# Patient Record
Sex: Female | Born: 1945
Health system: Southern US, Community
[De-identification: ages and names within clinical notes are randomized; demographics above are authoritative.]

## PROBLEM LIST (undated history)

## (undated) DIAGNOSIS — L732 Hidradenitis suppurativa: Secondary | ICD-10-CM

## (undated) DIAGNOSIS — K648 Other hemorrhoids: Secondary | ICD-10-CM

## (undated) DIAGNOSIS — J329 Chronic sinusitis, unspecified: Secondary | ICD-10-CM

## (undated) DIAGNOSIS — R9431 Abnormal electrocardiogram [ECG] [EKG]: Secondary | ICD-10-CM

## (undated) DIAGNOSIS — I10 Essential (primary) hypertension: Secondary | ICD-10-CM

## (undated) DIAGNOSIS — T7840XA Allergy, unspecified, initial encounter: Secondary | ICD-10-CM

## (undated) DIAGNOSIS — H269 Unspecified cataract: Secondary | ICD-10-CM

## (undated) DIAGNOSIS — D126 Benign neoplasm of colon, unspecified: Secondary | ICD-10-CM

## (undated) DIAGNOSIS — K760 Fatty (change of) liver, not elsewhere classified: Secondary | ICD-10-CM

## (undated) DIAGNOSIS — M1990 Unspecified osteoarthritis, unspecified site: Secondary | ICD-10-CM

## (undated) DIAGNOSIS — J45909 Unspecified asthma, uncomplicated: Secondary | ICD-10-CM

## (undated) DIAGNOSIS — Z5189 Encounter for other specified aftercare: Secondary | ICD-10-CM

## (undated) DIAGNOSIS — K219 Gastro-esophageal reflux disease without esophagitis: Secondary | ICD-10-CM

## (undated) DIAGNOSIS — E785 Hyperlipidemia, unspecified: Secondary | ICD-10-CM

## (undated) DIAGNOSIS — K635 Polyp of colon: Secondary | ICD-10-CM

## (undated) HISTORY — DX: Encounter for other specified aftercare: Z51.89

## (undated) HISTORY — DX: Essential (primary) hypertension: I10

## (undated) HISTORY — DX: Gastro-esophageal reflux disease without esophagitis: K21.9

## (undated) HISTORY — DX: Allergy, unspecified, initial encounter: T78.40XA

## (undated) HISTORY — DX: Fatty (change of) liver, not elsewhere classified: K76.0

## (undated) HISTORY — PX: COLONOSCOPY: SHX174

## (undated) HISTORY — DX: Benign neoplasm of colon, unspecified: D12.6

## (undated) HISTORY — DX: Hidradenitis suppurativa: L73.2

## (undated) HISTORY — PX: POLYPECTOMY: SHX149

## (undated) HISTORY — PX: TUBAL LIGATION: SHX77

## (undated) HISTORY — DX: Unspecified osteoarthritis, unspecified site: M19.90

## (undated) HISTORY — DX: Unspecified asthma, uncomplicated: J45.909

## (undated) HISTORY — DX: Unspecified cataract: H26.9

## (undated) HISTORY — PX: CARPAL TUNNEL RELEASE: SHX101

## (undated) HISTORY — DX: Other hemorrhoids: K64.8

## (undated) HISTORY — DX: Polyp of colon: K63.5

## (undated) HISTORY — DX: Hyperlipidemia, unspecified: E78.5

---

## 1998-06-10 ENCOUNTER — Ambulatory Visit: Admission: RE | Admit: 1998-06-10 | Discharge: 1998-06-10 | Payer: Self-pay | Admitting: Internal Medicine

## 1998-06-10 ENCOUNTER — Encounter: Payer: Self-pay | Admitting: Internal Medicine

## 1999-07-23 ENCOUNTER — Encounter: Payer: Self-pay | Admitting: Internal Medicine

## 1999-07-23 ENCOUNTER — Encounter: Admission: RE | Admit: 1999-07-23 | Discharge: 1999-07-23 | Payer: Self-pay | Admitting: Internal Medicine

## 1999-08-24 ENCOUNTER — Other Ambulatory Visit: Admission: RE | Admit: 1999-08-24 | Discharge: 1999-08-24 | Payer: Self-pay | Admitting: Internal Medicine

## 2000-07-27 ENCOUNTER — Encounter: Admission: RE | Admit: 2000-07-27 | Discharge: 2000-07-27 | Payer: Self-pay | Admitting: Internal Medicine

## 2000-07-27 ENCOUNTER — Encounter: Payer: Self-pay | Admitting: Internal Medicine

## 2001-08-02 ENCOUNTER — Encounter: Payer: Self-pay | Admitting: Internal Medicine

## 2001-08-02 ENCOUNTER — Encounter: Admission: RE | Admit: 2001-08-02 | Discharge: 2001-08-02 | Payer: Self-pay | Admitting: Internal Medicine

## 2001-09-20 ENCOUNTER — Other Ambulatory Visit: Admission: RE | Admit: 2001-09-20 | Discharge: 2001-09-20 | Payer: Self-pay | Admitting: Internal Medicine

## 2001-10-30 ENCOUNTER — Encounter: Admission: RE | Admit: 2001-10-30 | Discharge: 2001-10-30 | Payer: Self-pay | Admitting: Gynecology

## 2001-10-30 ENCOUNTER — Encounter: Payer: Self-pay | Admitting: Gynecology

## 2002-08-07 ENCOUNTER — Encounter: Payer: Self-pay | Admitting: Internal Medicine

## 2002-08-07 ENCOUNTER — Encounter: Admission: RE | Admit: 2002-08-07 | Discharge: 2002-08-07 | Payer: Self-pay | Admitting: Internal Medicine

## 2003-07-30 ENCOUNTER — Other Ambulatory Visit: Admission: RE | Admit: 2003-07-30 | Discharge: 2003-07-30 | Payer: Self-pay | Admitting: General Practice

## 2003-08-15 ENCOUNTER — Encounter: Admission: RE | Admit: 2003-08-15 | Discharge: 2003-08-15 | Payer: Self-pay | Admitting: Internal Medicine

## 2004-07-27 ENCOUNTER — Ambulatory Visit: Payer: Self-pay | Admitting: Internal Medicine

## 2004-08-14 ENCOUNTER — Other Ambulatory Visit: Admission: RE | Admit: 2004-08-14 | Discharge: 2004-08-14 | Payer: Self-pay | Admitting: Internal Medicine

## 2004-08-14 ENCOUNTER — Ambulatory Visit: Payer: Self-pay | Admitting: Internal Medicine

## 2004-08-25 ENCOUNTER — Encounter: Admission: RE | Admit: 2004-08-25 | Discharge: 2004-08-25 | Payer: Self-pay | Admitting: Internal Medicine

## 2004-09-10 ENCOUNTER — Ambulatory Visit: Payer: Self-pay | Admitting: Internal Medicine

## 2005-02-19 ENCOUNTER — Ambulatory Visit: Payer: Self-pay | Admitting: Internal Medicine

## 2005-04-08 ENCOUNTER — Ambulatory Visit: Payer: Self-pay | Admitting: Internal Medicine

## 2005-09-14 ENCOUNTER — Encounter: Admission: RE | Admit: 2005-09-14 | Discharge: 2005-09-14 | Payer: Self-pay | Admitting: Internal Medicine

## 2006-04-06 ENCOUNTER — Ambulatory Visit: Payer: Self-pay | Admitting: Internal Medicine

## 2006-04-11 ENCOUNTER — Ambulatory Visit: Payer: Self-pay | Admitting: Internal Medicine

## 2006-04-11 ENCOUNTER — Encounter: Payer: Self-pay | Admitting: Internal Medicine

## 2006-04-11 ENCOUNTER — Other Ambulatory Visit: Admission: RE | Admit: 2006-04-11 | Discharge: 2006-04-11 | Payer: Self-pay | Admitting: Internal Medicine

## 2006-06-20 ENCOUNTER — Ambulatory Visit: Payer: Self-pay | Admitting: Internal Medicine

## 2006-09-15 ENCOUNTER — Encounter: Admission: RE | Admit: 2006-09-15 | Discharge: 2006-09-15 | Payer: Self-pay | Admitting: Internal Medicine

## 2006-10-12 ENCOUNTER — Ambulatory Visit: Payer: Self-pay | Admitting: Internal Medicine

## 2006-12-09 ENCOUNTER — Ambulatory Visit (HOSPITAL_COMMUNITY): Admission: RE | Admit: 2006-12-09 | Discharge: 2006-12-09 | Payer: Self-pay | Admitting: Internal Medicine

## 2006-12-14 ENCOUNTER — Ambulatory Visit: Payer: Self-pay | Admitting: Internal Medicine

## 2007-01-13 ENCOUNTER — Ambulatory Visit: Payer: Self-pay | Admitting: Internal Medicine

## 2007-01-16 ENCOUNTER — Ambulatory Visit: Payer: Self-pay | Admitting: Internal Medicine

## 2007-01-16 LAB — CONVERTED CEMR LAB
AST: 29 units/L (ref 0–37)
Albumin: 3.9 g/dL (ref 3.5–5.2)
Basophils Absolute: 0 10*3/uL (ref 0.0–0.1)
Bilirubin Urine: NEGATIVE
Bilirubin, Direct: 0.1 mg/dL (ref 0.0–0.3)
CO2: 29 meq/L (ref 19–32)
Calcium: 9.3 mg/dL (ref 8.4–10.5)
Chloride: 109 meq/L (ref 96–112)
Creatinine, Ser: 0.8 mg/dL (ref 0.4–1.2)
Crystals: NEGATIVE
Eosinophils Absolute: 0.1 10*3/uL (ref 0.0–0.6)
Glucose, Bld: 100 mg/dL — ABNORMAL HIGH (ref 70–99)
HDL: 37.7 mg/dL — ABNORMAL LOW (ref >39.0)
Hemoglobin: 12.6 g/dL (ref 12.0–15.0)
Hgb A1c MFr Bld: 5.8 % (ref 4.6–6.0)
Ketones, ur: NEGATIVE mg/dL
Lymphocytes Relative: 35.1 % (ref 12.0–46.0)
MCV: 74 fL — ABNORMAL LOW (ref 78.0–100.0)
Monocytes Relative: 10.2 % (ref 3.0–11.0)
Mucus, UA: NEGATIVE
Neutro Abs: 2.4 10*3/uL (ref 1.4–7.7)
Neutrophils Relative %: 50.3 % (ref 43.0–77.0)
Potassium: 3.9 meq/L (ref 3.5–5.1)
RBC: 5.27 M/uL — ABNORMAL HIGH (ref 3.87–5.11)
Sodium: 143 meq/L (ref 135–145)
Total Bilirubin: 0.8 mg/dL (ref 0.3–1.2)
Total Protein: 7.1 g/dL (ref 6.0–8.3)
Triglycerides: 104 mg/dL (ref 0–149)
Urine Glucose: NEGATIVE mg/dL
Urobilinogen, UA: 0.2 (ref 0.0–1.0)
Vit D, 1,25-Dihydroxy: 23 (ref 20–57)
WBC: 4.4 10*3/uL — ABNORMAL LOW (ref 4.5–10.5)

## 2007-03-17 DIAGNOSIS — D126 Benign neoplasm of colon, unspecified: Secondary | ICD-10-CM

## 2007-03-17 HISTORY — DX: Benign neoplasm of colon, unspecified: D12.6

## 2007-03-21 ENCOUNTER — Ambulatory Visit: Payer: Self-pay | Admitting: Gastroenterology

## 2007-04-13 ENCOUNTER — Ambulatory Visit: Payer: Self-pay | Admitting: Gastroenterology

## 2007-04-13 ENCOUNTER — Encounter: Payer: Self-pay | Admitting: Gastroenterology

## 2007-04-19 ENCOUNTER — Inpatient Hospital Stay (HOSPITAL_COMMUNITY): Admission: EM | Admit: 2007-04-19 | Discharge: 2007-04-21 | Payer: Self-pay | Admitting: Emergency Medicine

## 2007-04-28 ENCOUNTER — Ambulatory Visit: Payer: Self-pay | Admitting: Gastroenterology

## 2007-05-11 ENCOUNTER — Ambulatory Visit: Payer: Self-pay | Admitting: Gastroenterology

## 2007-05-11 LAB — CONVERTED CEMR LAB
Basophils Absolute: 0.1 10*3/uL (ref 0.0–0.1)
Basophils Relative: 0.9 % (ref 0.0–1.0)
Eosinophils Relative: 3.4 % (ref 0.0–5.0)
Hemoglobin: 11.9 g/dL — ABNORMAL LOW (ref 12.0–15.0)
MCHC: 32.4 g/dL (ref 30.0–36.0)
Monocytes Absolute: 0.8 10*3/uL — ABNORMAL HIGH (ref 0.2–0.7)
Neutrophils Relative %: 55.1 % (ref 43.0–77.0)
RBC: 4.59 M/uL (ref 3.87–5.11)
RDW: 14.3 % (ref 11.5–14.6)

## 2007-06-12 DIAGNOSIS — E119 Type 2 diabetes mellitus without complications: Secondary | ICD-10-CM

## 2007-06-12 HISTORY — DX: Type 2 diabetes mellitus without complications: E11.9

## 2007-06-20 ENCOUNTER — Ambulatory Visit: Payer: Self-pay | Admitting: Internal Medicine

## 2007-06-20 DIAGNOSIS — E785 Hyperlipidemia, unspecified: Secondary | ICD-10-CM

## 2007-06-20 HISTORY — DX: Hyperlipidemia, unspecified: E78.5

## 2007-06-20 LAB — CONVERTED CEMR LAB
ALT: 42 units/L — ABNORMAL HIGH (ref 0–35)
AST: 31 units/L (ref 0–37)
LDL Cholesterol: 126 mg/dL — ABNORMAL HIGH (ref 0–99)
VLDL: 16 mg/dL (ref 0–40)

## 2007-07-11 ENCOUNTER — Ambulatory Visit: Payer: Self-pay | Admitting: Internal Medicine

## 2007-07-11 ENCOUNTER — Ambulatory Visit: Payer: Self-pay | Admitting: Gastroenterology

## 2007-07-11 DIAGNOSIS — I10 Essential (primary) hypertension: Secondary | ICD-10-CM

## 2007-07-11 DIAGNOSIS — D500 Iron deficiency anemia secondary to blood loss (chronic): Secondary | ICD-10-CM | POA: Insufficient documentation

## 2007-07-11 DIAGNOSIS — Z86010 Personal history of colonic polyps: Secondary | ICD-10-CM

## 2007-07-11 HISTORY — DX: Essential (primary) hypertension: I10

## 2007-07-11 LAB — CONVERTED CEMR LAB
Eosinophils Absolute: 0.2 10*3/uL (ref 0.0–0.6)
HCT: 39.9 % (ref 36.0–46.0)
Hemoglobin: 12.9 g/dL (ref 12.0–15.0)
MCHC: 32.2 g/dL (ref 30.0–36.0)
Monocytes Absolute: 0.6 10*3/uL (ref 0.2–0.7)
Monocytes Relative: 9.9 % (ref 3.0–11.0)
Neutro Abs: 3.6 10*3/uL (ref 1.4–7.7)
Neutrophils Relative %: 54.2 % (ref 43.0–77.0)
RBC: 5.2 M/uL — ABNORMAL HIGH (ref 3.87–5.11)
RDW: 13.2 % (ref 11.5–14.6)

## 2007-08-29 ENCOUNTER — Telehealth: Payer: Self-pay | Admitting: Internal Medicine

## 2007-09-25 ENCOUNTER — Telehealth: Payer: Self-pay | Admitting: Internal Medicine

## 2007-10-09 ENCOUNTER — Ambulatory Visit (HOSPITAL_COMMUNITY): Admission: RE | Admit: 2007-10-09 | Discharge: 2007-10-09 | Payer: Self-pay | Admitting: Internal Medicine

## 2007-10-24 ENCOUNTER — Ambulatory Visit: Payer: Self-pay | Admitting: Internal Medicine

## 2007-10-24 DIAGNOSIS — R7309 Other abnormal glucose: Secondary | ICD-10-CM | POA: Insufficient documentation

## 2007-10-24 LAB — CONVERTED CEMR LAB
Basophils Relative: 0.9 % (ref 0.0–1.0)
CO2: 28 meq/L (ref 19–32)
Creatinine, Ser: 0.9 mg/dL (ref 0.4–1.2)
Eosinophils Absolute: 0.2 10*3/uL (ref 0.0–0.6)
Eosinophils Relative: 3.6 % (ref 0.0–5.0)
GFR calc Af Amer: 82 mL/min
GFR calc non Af Amer: 68 mL/min
MCHC: 30.9 g/dL (ref 30.0–36.0)
Monocytes Absolute: 0.7 10*3/uL (ref 0.2–0.7)
Monocytes Relative: 12.5 % — ABNORMAL HIGH (ref 3.0–11.0)
Neutro Abs: 2.6 10*3/uL (ref 1.4–7.7)
Potassium: 4.2 meq/L (ref 3.5–5.1)
RDW: 12.7 % (ref 11.5–14.6)
Sodium: 141 meq/L (ref 135–145)
WBC: 5.3 10*3/uL (ref 4.5–10.5)

## 2007-10-27 ENCOUNTER — Ambulatory Visit: Payer: Self-pay | Admitting: Internal Medicine

## 2007-10-27 DIAGNOSIS — D649 Anemia, unspecified: Secondary | ICD-10-CM

## 2007-10-27 DIAGNOSIS — K922 Gastrointestinal hemorrhage, unspecified: Secondary | ICD-10-CM | POA: Insufficient documentation

## 2007-10-27 DIAGNOSIS — K921 Melena: Secondary | ICD-10-CM

## 2007-10-27 DIAGNOSIS — G5600 Carpal tunnel syndrome, unspecified upper limb: Secondary | ICD-10-CM | POA: Insufficient documentation

## 2007-10-27 DIAGNOSIS — M1990 Unspecified osteoarthritis, unspecified site: Secondary | ICD-10-CM

## 2007-10-27 DIAGNOSIS — K649 Unspecified hemorrhoids: Secondary | ICD-10-CM | POA: Insufficient documentation

## 2007-10-27 HISTORY — DX: Anemia, unspecified: D64.9

## 2007-10-27 HISTORY — DX: Unspecified hemorrhoids: K64.9

## 2007-10-27 HISTORY — DX: Melena: K92.1

## 2007-10-27 HISTORY — DX: Carpal tunnel syndrome, unspecified upper limb: G56.00

## 2007-10-27 HISTORY — DX: Gastrointestinal hemorrhage, unspecified: K92.2

## 2008-03-05 ENCOUNTER — Ambulatory Visit: Payer: Self-pay | Admitting: Gastroenterology

## 2008-03-07 ENCOUNTER — Telehealth: Payer: Self-pay | Admitting: Gastroenterology

## 2008-03-08 ENCOUNTER — Telehealth (INDEPENDENT_AMBULATORY_CARE_PROVIDER_SITE_OTHER): Payer: Self-pay | Admitting: General Practice

## 2008-03-11 ENCOUNTER — Telehealth: Payer: Self-pay | Admitting: Gastroenterology

## 2008-03-22 ENCOUNTER — Ambulatory Visit: Payer: Self-pay | Admitting: Internal Medicine

## 2008-03-22 DIAGNOSIS — R609 Edema, unspecified: Secondary | ICD-10-CM | POA: Insufficient documentation

## 2008-04-02 ENCOUNTER — Ambulatory Visit: Payer: Self-pay | Admitting: Gastroenterology

## 2008-04-02 ENCOUNTER — Encounter: Payer: Self-pay | Admitting: Gastroenterology

## 2008-04-02 LAB — HM COLONOSCOPY

## 2008-04-04 ENCOUNTER — Encounter: Payer: Self-pay | Admitting: Gastroenterology

## 2008-05-16 ENCOUNTER — Ambulatory Visit: Payer: Self-pay | Admitting: Internal Medicine

## 2008-05-16 LAB — CONVERTED CEMR LAB
BUN: 15 mg/dL (ref 6–23)
CO2: 30 meq/L (ref 19–32)
Chloride: 105 meq/L (ref 96–112)
Creatinine, Ser: 1 mg/dL (ref 0.4–1.2)
Eosinophils Relative: 2.3 % (ref 0.0–5.0)
Glucose, Bld: 97 mg/dL (ref 70–99)
Hgb A1c MFr Bld: 6 % (ref 4.6–6.0)
Lymphocytes Relative: 35.3 % (ref 12.0–46.0)
Monocytes Relative: 12.9 % — ABNORMAL HIGH (ref 3.0–12.0)
Neutrophils Relative %: 48.7 % (ref 43.0–77.0)
Platelets: 318 10*3/uL (ref 150–400)
Potassium: 4.3 meq/L (ref 3.5–5.1)
RBC: 5.11 M/uL (ref 3.87–5.11)
WBC: 5.8 10*3/uL (ref 4.5–10.5)

## 2008-05-22 ENCOUNTER — Ambulatory Visit: Payer: Self-pay | Admitting: Internal Medicine

## 2008-08-05 ENCOUNTER — Ambulatory Visit: Payer: Self-pay | Admitting: Internal Medicine

## 2008-08-05 DIAGNOSIS — J039 Acute tonsillitis, unspecified: Secondary | ICD-10-CM | POA: Insufficient documentation

## 2008-08-05 HISTORY — DX: Acute tonsillitis, unspecified: J03.90

## 2008-08-06 ENCOUNTER — Telehealth (INDEPENDENT_AMBULATORY_CARE_PROVIDER_SITE_OTHER): Payer: Self-pay | Admitting: *Deleted

## 2008-08-07 ENCOUNTER — Emergency Department (HOSPITAL_COMMUNITY): Admission: EM | Admit: 2008-08-07 | Discharge: 2008-08-07 | Payer: Self-pay | Admitting: Emergency Medicine

## 2008-08-19 ENCOUNTER — Ambulatory Visit: Payer: Self-pay | Admitting: Internal Medicine

## 2008-09-23 ENCOUNTER — Telehealth: Payer: Self-pay | Admitting: Internal Medicine

## 2008-11-07 ENCOUNTER — Telehealth: Payer: Self-pay | Admitting: Internal Medicine

## 2008-11-25 ENCOUNTER — Ambulatory Visit (HOSPITAL_COMMUNITY): Admission: RE | Admit: 2008-11-25 | Discharge: 2008-11-25 | Payer: Self-pay | Admitting: Internal Medicine

## 2008-12-03 ENCOUNTER — Ambulatory Visit: Payer: Self-pay | Admitting: Internal Medicine

## 2008-12-11 ENCOUNTER — Telehealth: Payer: Self-pay | Admitting: Internal Medicine

## 2009-01-14 DIAGNOSIS — L732 Hidradenitis suppurativa: Secondary | ICD-10-CM

## 2009-01-14 HISTORY — DX: Hidradenitis suppurativa: L73.2

## 2009-01-31 ENCOUNTER — Ambulatory Visit: Payer: Self-pay | Admitting: Internal Medicine

## 2009-01-31 DIAGNOSIS — L03818 Cellulitis of other sites: Secondary | ICD-10-CM

## 2009-01-31 DIAGNOSIS — L0390 Cellulitis, unspecified: Secondary | ICD-10-CM | POA: Insufficient documentation

## 2009-01-31 DIAGNOSIS — L02818 Cutaneous abscess of other sites: Secondary | ICD-10-CM

## 2009-02-01 ENCOUNTER — Ambulatory Visit: Payer: Self-pay | Admitting: Family Medicine

## 2009-02-01 ENCOUNTER — Encounter: Payer: Self-pay | Admitting: Internal Medicine

## 2009-02-03 ENCOUNTER — Ambulatory Visit: Payer: Self-pay | Admitting: Internal Medicine

## 2009-02-06 ENCOUNTER — Telehealth: Payer: Self-pay | Admitting: Internal Medicine

## 2009-04-04 ENCOUNTER — Ambulatory Visit: Payer: Self-pay | Admitting: Internal Medicine

## 2009-04-04 DIAGNOSIS — R42 Dizziness and giddiness: Secondary | ICD-10-CM

## 2009-04-04 HISTORY — DX: Dizziness and giddiness: R42

## 2009-06-06 ENCOUNTER — Ambulatory Visit: Payer: Self-pay | Admitting: Internal Medicine

## 2009-09-08 ENCOUNTER — Ambulatory Visit: Payer: Self-pay | Admitting: Internal Medicine

## 2009-09-08 DIAGNOSIS — N3090 Cystitis, unspecified without hematuria: Secondary | ICD-10-CM

## 2009-09-08 HISTORY — DX: Cystitis, unspecified without hematuria: N30.90

## 2009-09-10 LAB — CONVERTED CEMR LAB
Bilirubin Urine: NEGATIVE
Hemoglobin, Urine: NEGATIVE
Ketones, ur: NEGATIVE mg/dL
Urine Glucose: NEGATIVE mg/dL
pH: 5 (ref 5.0–8.0)

## 2009-09-26 ENCOUNTER — Telehealth: Payer: Self-pay | Admitting: Internal Medicine

## 2009-12-01 ENCOUNTER — Ambulatory Visit: Payer: Self-pay | Admitting: Internal Medicine

## 2009-12-01 DIAGNOSIS — H6120 Impacted cerumen, unspecified ear: Secondary | ICD-10-CM

## 2009-12-01 HISTORY — DX: Impacted cerumen, unspecified ear: H61.20

## 2009-12-22 ENCOUNTER — Ambulatory Visit (HOSPITAL_COMMUNITY): Admission: RE | Admit: 2009-12-22 | Discharge: 2009-12-22 | Payer: Self-pay | Admitting: Internal Medicine

## 2010-02-06 ENCOUNTER — Encounter (INDEPENDENT_AMBULATORY_CARE_PROVIDER_SITE_OTHER): Payer: Self-pay | Admitting: *Deleted

## 2010-03-12 ENCOUNTER — Telehealth: Payer: Self-pay | Admitting: Gastroenterology

## 2010-03-13 ENCOUNTER — Ambulatory Visit: Payer: Self-pay | Admitting: Internal Medicine

## 2010-03-13 LAB — CONVERTED CEMR LAB
CO2: 26 meq/L (ref 19–32)
Calcium: 9.1 mg/dL (ref 8.4–10.5)
Potassium: 4.1 meq/L (ref 3.5–5.1)
Sodium: 136 meq/L (ref 135–145)

## 2010-03-30 ENCOUNTER — Ambulatory Visit: Payer: Self-pay | Admitting: Internal Medicine

## 2010-06-23 ENCOUNTER — Telehealth: Payer: Self-pay | Admitting: Internal Medicine

## 2010-07-27 ENCOUNTER — Ambulatory Visit: Payer: Self-pay | Admitting: Internal Medicine

## 2010-07-27 DIAGNOSIS — H60399 Other infective otitis externa, unspecified ear: Secondary | ICD-10-CM

## 2010-07-27 DIAGNOSIS — H6090 Unspecified otitis externa, unspecified ear: Secondary | ICD-10-CM

## 2010-07-27 HISTORY — DX: Unspecified otitis externa, unspecified ear: H60.90

## 2010-07-30 LAB — CONVERTED CEMR LAB
CO2: 29 meq/L (ref 19–32)
Chloride: 105 meq/L (ref 96–112)
Creatinine, Ser: 0.9 mg/dL (ref 0.4–1.2)
GFR calc non Af Amer: 83.28 mL/min (ref >60.00)
Hgb A1c MFr Bld: 6.1 % (ref 4.6–6.5)
Potassium: 4.5 meq/L (ref 3.5–5.1)
Sodium: 141 meq/L (ref 135–145)

## 2010-09-15 ENCOUNTER — Ambulatory Visit
Admission: RE | Admit: 2010-09-15 | Discharge: 2010-09-15 | Payer: Self-pay | Source: Home / Self Care | Attending: Internal Medicine | Admitting: Internal Medicine

## 2010-09-15 LAB — CONVERTED CEMR LAB
Glucose, Urine, Semiquant: NEGATIVE
Ketones, urine, test strip: NEGATIVE
Specific Gravity, Urine: 1.015
pH: 6

## 2010-09-15 NOTE — Assessment & Plan Note (Signed)
Summary: 3 mos f/u//#/cd   Vital Signs:  Patient profile:   65 year old female Height:      61 inches Weight:      153.75 pounds BMI:     29.16 O2 Sat:      97 % on Room air Temp:     97.4 degrees F oral Pulse rate:   94 / minute BP sitting:   106 / 68  (left arm) Cuff size:   regular  Vitals Entered By: Lucious Groves (December 01, 2009 9:16 AM)  O2 Flow:  Room air CC: F/U--Per pt no symptoms or complaints./kb Is Patient Diabetic? No Pain Assessment Patient in pain? no        Primary Care Provider:  Tresa Garter MD  CC:  F/U--Per pt no symptoms or complaints./kb.  History of Present Illness: The patient presents for a follow up of hypertension, hyperlipidemia   Current Medications (verified): 1)  Zyrtec Allergy 10 Mg  Tabs (Cetirizine Hcl) .... As Needed 2)  Vitamin D3 1000 Unit  Tabs (Cholecalciferol) .Marland Kitchen.. 1 Qd 3)  Enalapril Maleate 10 Mg  Tabs (Enalapril Maleate) .Marland Kitchen.. 1 Qd 4)  Lovastatin 20 Mg Tabs (Lovastatin) .... Take 1 Tab By Mouth Daily  Allergies (verified): No Known Drug Allergies  Past History:  Past Medical History: Last updated: 02/03/2009 Diabetes mellitus, type II GERD fatty liver infiltration elevated liver function test  790.5 chronic T  wave abn ,st  on ekg Colonic polyps, hx of Hyperlipidemia Hypertension R hydradenitis S aureus 01/2009  Social History: Last updated: 12/01/2009 Retired Never Smoked Alcohol use-no Drug use-no Regular exercise-yes [employer]  Social History: Retired Never Smoked Alcohol use-no Drug use-no Regular exercise-yes [employer]  Physical Exam  General:  Well-developed,well-nourished,in no acute distress; alert,appropriate and cooperative throughout examination Ears:  wax B R>L Mouth:  Oral mucosa and oropharynx without lesions or exudates.  Teeth in good repair. Lungs:  Normal respiratory effort, chest expands symmetrically. Lungs are clear to auscultation, no crackles or wheezes. Heart:  Normal rate and  regular rhythm. S1 and S2 normal without gallop, murmur, click, rub or other extra sounds. Abdomen:  Bowel sounds positive,abdomen soft and non-tender without masses, organomegaly or hernias noted. Msk:  No deformity or scoliosis noted of thoracic or lumbar spine.   Neurologic:  No cranial nerve deficits noted. Station and gait are normal. Plantar reflexes are down-going bilaterally. DTRs are symmetrical throughout. Sensory, motor and coordinative functions appear intact. Skin:  Clear Psych:  Oriented X3 and normally interactive.     Impression & Recommendations:  Problem # 1:  HYPERTENSION (ICD-401.9) Assessment Unchanged  The following medications were removed from the medication list:    Furosemide 20 Mg Tabs (Furosemide) .Marland Kitchen... Take 1 tab by mouth every day as needed swelling Her updated medication list for this problem includes:    Enalapril Maleate 10 Mg Tabs (Enalapril maleate) .Marland Kitchen... 1 qd  Problem # 2:  HYPERLIPIDEMIA (ICD-272.4) Assessment: Unchanged  Her updated medication list for this problem includes:    Lovastatin 20 Mg Tabs (Lovastatin) .Marland Kitchen... Take 1 tab by mouth daily  Problem # 3:  DIABETES MELLITUS, TYPE II (ICD-250.00) Assessment: Comment Only  Her updated medication list for this problem includes:    Enalapril Maleate 10 Mg Tabs (Enalapril maleate) .Marland Kitchen... 1 qd  Labs Reviewed: Creat: 1.0 (05/16/2008)    Reviewed HgBA1c results: 6.0 (05/16/2008)  5.9 (10/24/2007)  Problem # 4:  CERUMEN IMPACTION (ICD-380.4) R>>L Assessment: New She will use irrigation at home  Complete Medication List: 1)  Zyrtec Allergy 10 Mg Tabs (Cetirizine hcl) .... As needed 2)  Vitamin D3 1000 Unit Tabs (Cholecalciferol) .Marland Kitchen.. 1 qd 3)  Enalapril Maleate 10 Mg Tabs (Enalapril maleate) .Marland Kitchen.. 1 qd 4)  Lovastatin 20 Mg Tabs (Lovastatin) .... Take 1 tab by mouth daily  Patient Instructions: 1)  Please schedule a follow-up appointment in 4 months. 2)  BMP prior to visit, ICD-9:250.00 3)   HbgA1C prior to visit, ICD-9:

## 2010-09-15 NOTE — Progress Notes (Signed)
Summary: ABX?   Phone Note Call from Patient   Summary of Call: Pt was give rx for antibiotic for uti recently. She continues to c/o pressure and "twinges" everyone once in a while. She thinks she may need another antibiotic.  Initial call taken by: Lamar Sprinkles, CMA,  September 26, 2009 5:07 PM  Follow-up for Phone Call        [facility] Follow-up by: Tresa Garter MD,  September 26, 2009 5:28 PM  Additional Follow-up for Phone Call Additional follow up Details #1::        Left vm for pt to check with her pharmacy.  Additional Follow-up by: Lamar Sprinkles, CMA,  September 26, 2009 6:07 PM    New/Updated Medications: CIPROFLOXACIN HCL 250 MG TABS (CIPROFLOXACIN HCL) 1 by mouth two times a day for cystitis Prescriptions: CIPROFLOXACIN HCL 250 MG TABS (CIPROFLOXACIN HCL) 1 by mouth two times a day for cystitis  #10 x 0   Entered and Authorized by:   Tresa Garter MD   Signed by:   Lamar Sprinkles, CMA on 09/26/2009   Method used:   Electronically to        [facility] [phone]* (retail)       [address]       [place], [place]  [address]       Ph: [phone]       Fax: [phone]   RxID:   [id number]

## 2010-09-15 NOTE — Assessment & Plan Note (Signed)
Summary: 3 mo rov /nws   Vital Signs:  Patient profile:   65 year old female Weight:      156 pounds Temp:     97.9 degrees F oral Pulse rate:   94 / minute BP sitting:   102 / 84  (left arm)  Vitals Entered By: Tora Perches (September 08, 2009 9:21 AM) CC: f/u   Primary Care Provider:  Tresa Garter MD  CC:  f/u.  History of Present Illness: C/o pain under L shoulder blade worse w/ROM C/o UTI The patient presents for a follow up of hypertension, diabetes, hyperlipidemia   Preventive Screening-Counseling & Management  Alcohol-Tobacco     Smoking Status: never  Current Medications (verified): 1)  Zyrtec Allergy 10 Mg  Tabs (Cetirizine Hcl) .... As Needed 2)  Vitamin D3 1000 Unit  Tabs (Cholecalciferol) .Marland Kitchen.. 1 Qd 3)  Enalapril Maleate 10 Mg  Tabs (Enalapril Maleate) .Marland Kitchen.. 1 Qd 4)  Lovastatin 20 Mg Tabs (Lovastatin) .... Take 1 Tab By Mouth Daily 5)  Furosemide 20 Mg Tabs (Furosemide) .... Take 1 Tab By Mouth Every Day As Needed Swelling 6)  Hydrocodone-Acetaminophen 7.5-500 Mg/7ml Soln (Hydrocodone-Acetaminophen) .... 5-10 Ml By Mouth Q 6 Hours As Needed Pain 7)  Triamcinolone Acetonide 0.5 % Crea (Triamcinolone Acetonide) .... Apply Bid To Affected Area 8)  Meclizine Hcl 12.5 Mg Tabs (Meclizine Hcl) .Marland Kitchen.. 1 By Mouth Qid As Needed Dizziness 9)  Transderm-Scop 1.5 Mg Pt72 (Scopolamine Base) .Marland Kitchen.. 1 Q 72 H  Allergies (verified): No Known Drug Allergies  Past History:  Past Medical History: Last updated: 02/03/2009 Diabetes mellitus, type II GERD fatty liver infiltration elevated liver function test  790.5 chronic T  wave abn ,st  on ekg Colonic polyps, hx of Hyperlipidemia Hypertension R hydradenitis S aureus 01/2009  Social History: Last updated: 08/05/2008 Retired Never Smoked Alcohol use-no Drug use-no Regular exercise-no  Family History: Reviewed history from 07/11/2007 and no changes required. Family History Hypertension Family History of Stroke  F 1st degree relative <60  Review of Systems  The patient denies fever, abdominal pain, difficulty walking, depression, and abnormal bleeding.    Physical Exam  General:  Well-developed,well-nourished,in no acute distress; alert,appropriate and cooperative throughout examination Mouth:  Oral mucosa and oropharynx without lesions or exudates.  Teeth in good repair. Chest [place]:  tender muscle under L scapula Lungs:  Normal respiratory effort, chest expands symmetrically. Lungs are clear to auscultation, no crackles or wheezes. Heart:  Normal rate and regular rhythm. S1 and S2 normal without gallop, murmur, click, rub or other extra sounds. Abdomen:  Bowel sounds positive,abdomen soft and non-tender without masses, organomegaly or hernias noted. Msk:  No deformity or scoliosis noted of thoracic or lumbar spine.   Neurologic:  No cranial nerve deficits noted. Station and gait are normal. Plantar reflexes are down-going bilaterally. DTRs are symmetrical throughout. Sensory, motor and coordinative functions appear intact. Skin:  Clear Psych:  Oriented X3 and normally interactive.     Impression & Recommendations:  Problem # 1:  HYPERTENSION (ICD-401.9) Assessment Comment Only  Her updated medication list for this problem includes:    Enalapril Maleate 10 Mg Tabs (Enalapril maleate) .Marland Kitchen... 1 qd    Furosemide 20 Mg Tabs (Furosemide) .Marland Kitchen... Take 1 tab by mouth every day as needed swelling  Problem # 2:  DIABETES MELLITUS, TYPE II (ICD-250.00) Assessment: Comment Only  Her updated medication list for this problem includes:    Enalapril Maleate 10 Mg Tabs (Enalapril maleate) .Marland Kitchen... 1 qd  Problem # 3:  GERD (ICD-530.81) Assessment: Comment Only  Problem # 4:  CYSTITIS (ICD-595.9)  Her updated medication list for this problem includes:    Ciprofloxacin Hcl 250 Mg Tabs (Ciprofloxacin hcl) .Marland Kitchen... 1 by mouth two times a day for cystitis  Orders: TLB-Udip ONLY (81003-UDIP)  Problem # 5:   HYPERLIPIDEMIA (ICD-272.4) Assessment: Comment Only  Her updated medication list for this problem includes:    Lovastatin 20 Mg Tabs (Lovastatin) .Marland Kitchen... Take 1 tab by mouth daily  Problem # 6:  Chest Marschner pain   MSK Assessment: Improved Heat  Complete Medication List: 1)  Zyrtec Allergy 10 Mg Tabs (Cetirizine hcl) .... As needed 2)  Vitamin D3 1000 Unit Tabs (Cholecalciferol) .Marland Kitchen.. 1 qd 3)  Enalapril Maleate 10 Mg Tabs (Enalapril maleate) .Marland Kitchen.. 1 qd 4)  Lovastatin 20 Mg Tabs (Lovastatin) .... Take 1 tab by mouth daily 5)  Furosemide 20 Mg Tabs (Furosemide) .... Take 1 tab by mouth every day as needed swelling 6)  Hydrocodone-acetaminophen 7.5-500 Mg/23ml Soln (Hydrocodone-acetaminophen) .... 5-10 ml by mouth q 6 hours as needed pain 7)  Triamcinolone Acetonide 0.5 % Crea (Triamcinolone acetonide) .... Apply bid to affected area 8)  Meclizine Hcl 12.5 Mg Tabs (Meclizine hcl) .Marland Kitchen.. 1 by mouth qid as needed dizziness 9)  Transderm-scop 1.5 Mg Pt72 (Scopolamine base) .Marland Kitchen.. 1 q 72 h 10)  Ciprofloxacin Hcl 250 Mg Tabs (Ciprofloxacin hcl) .Marland Kitchen.. 1 by mouth two times a day for cystitis 11)  Ibuprofen 600 Mg Tabs (Ibuprofen) .Marland Kitchen.. 1 by mouth bid  pc x 1 wk then as needed for  pain  Patient Instructions: 1)  Please schedule a follow-up appointment in 3 months. Prescriptions: IBUPROFEN 600 MG TABS (IBUPROFEN) 1 by mouth bid  pc x 1 wk then as needed for  pain  #60 x 3   Entered and Authorized by:   Tresa Garter MD   Signed by:   Tresa Garter MD on 09/08/2009   Method used:   Print then Give to Patient   RxID:   [id number] CIPROFLOXACIN HCL 250 MG TABS (CIPROFLOXACIN HCL) 1 by mouth two times a day for cystitis  #10 x 0   Entered and Authorized by:   Tresa Garter MD   Signed by:   Tresa Garter MD on 09/08/2009   Method used:   Print then Give to Patient   RxID:   [id number]

## 2010-09-15 NOTE — Progress Notes (Signed)
  Phone Note Call from Patient   Summary of Call: Pt called stating that she was not successful cleaning out her ear (as directed by MD at last ov) and wanted to know what to do. I explained how to flush ears at home and she now understands. Pt will try again and schedule office visit if problem persists Initial call taken by: Lamar Sprinkles, CMA,  June 23, 2010 2:55 PM  Follow-up for Phone Call        agree Thank you!  Follow-up by: Tresa Garter MD,  June 24, 2010 7:58 AM

## 2010-09-15 NOTE — Letter (Signed)
Summary: Colonoscopy Letter  [facility]  [address] [place], [place] [address]   Phone: [phone]  Fax: [phone]      February 06, 2010 MRN: [id number]   [facility] [address] [place], [place]  [address]   Dear Monica Horn,   According to your medical record, it is time for you to schedule a Colonoscopy. The American Cancer Society recommends this procedure as a method to detect early colon cancer. Patients with a family history of colon cancer, or a personal history of colon polyps or inflammatory bowel disease are at increased risk.  This letter has beeen generated based on the recommendations made at the time of your procedure. If you feel that in your particular situation this may no longer apply, please contact our office.  Please call our office at [phone] to schedule this appointment or to update your records at your earliest convenience.  Thank you for cooperating with [place] to provide you with the very best care possible.   Sincerely,  Judie Petit T. Russella Dar, M.D.  [facility] [phone]

## 2010-09-15 NOTE — Progress Notes (Signed)
Summary: rec col update   Phone Note Call from Patient Call back at Home Phone [phone]   Caller: Patient Call For: Dr. Russella Dar Reason for Call: Talk to Nurse Summary of Call: pt received her recall letter and wanted to let our office know that she cannot afford the procedure at this time with no insurance... will be applying for fin assistance through [facility]... will c/b to sch after obtaining some sort of aide Initial call taken by: Vallarie Mare,  March 12, 2010 11:47 AM  Follow-up for Phone Call        Noted. Follow-up by: Meryl Dare MD Clementeen Graham,  March 12, 2010 12:11 PM

## 2010-09-15 NOTE — Assessment & Plan Note (Signed)
Summary: 4 mos f/u / #? cd   Vital Signs:  Patient profile:   65 year old female Height:      61 inches Weight:      153 pounds BMI:     29.01 O2 Sat:      97 % on Room air Temp:     98.2 degrees F oral Pulse rate:   102 / minute Pulse rhythm:   regular Resp:     16 per minute BP sitting:   120 / 80  (left arm) Cuff size:   regular  Vitals Entered By: Lanier Prude, CMA(AAMA) (March 30, 2010 9:04 AM)  O2 Flow:  Room air CC: 4 mo f/u Is Patient Diabetic? Yes   Primary Care Provider:  Tresa Garter MD  CC:  4 mo f/u.  History of Present Illness: The patient presents for a follow up of hypertension, diabetes, hyperlipidemia   Current Medications (verified): 1)  Zyrtec Allergy 10 Mg  Tabs (Cetirizine Hcl) .... As Needed 2)  Vitamin D3 1000 Unit  Tabs (Cholecalciferol) .Marland Kitchen.. 1 Qd 3)  Enalapril Maleate 10 Mg  Tabs (Enalapril Maleate) .Marland Kitchen.. 1 Qd 4)  Lovastatin 20 Mg Tabs (Lovastatin) .... Take 1 Tab By Mouth Daily  Allergies (verified): No Known Drug Allergies  Past History:  Past Medical History: Last updated: 02/03/2009 Diabetes mellitus, type II GERD fatty liver infiltration elevated liver function test  790.5 chronic T  wave abn ,st  on ekg Colonic polyps, hx of Hyperlipidemia Hypertension R hydradenitis S aureus 01/2009  Social History: Last updated: 12/01/2009 Retired Never Smoked Alcohol use-no Drug use-no Regular exercise-yes [employer]  Review of Systems  The patient denies fever, syncope, and difficulty walking.    Physical Exam  General:  Well-developed,well-nourished,in no acute distress; alert,appropriate and cooperative throughout examination Ears:  R wax Nose:  External nasal examination shows no deformity or inflammation. Nasal mucosa are pink and moist without lesions or exudates. Mouth:  Oral mucosa and oropharynx without lesions or exudates.  Teeth in good repair. Neck:  No deformities, masses, or tenderness noted. Lungs:  Normal  respiratory effort, chest expands symmetrically. Lungs are clear to auscultation, no crackles or wheezes. Heart:  Normal rate and regular rhythm. S1 and S2 normal without gallop, murmur, click, rub or other extra sounds. Abdomen:  Bowel sounds positive,abdomen soft and non-tender without masses, organomegaly or hernias noted. Msk:  No deformity or scoliosis noted of thoracic or lumbar spine.   Neurologic:  No cranial nerve deficits noted. Station and gait are normal. Plantar reflexes are down-going bilaterally. DTRs are symmetrical throughout. Sensory, motor and coordinative functions appear intact. Skin:  Clear Psych:  Oriented X3 and normally interactive.     Impression & Recommendations:  Problem # 1:  HYPERTENSION (ICD-401.9) Assessment Unchanged  Her updated medication list for this problem includes:    Enalapril Maleate 10 Mg Tabs (Enalapril maleate) .Marland Kitchen... 1 qd  BP today: 120/80 Prior BP: 106/68 (12/01/2009)  Labs Reviewed: K+: 4.1 (03/13/2010) Creat: : 0.9 (03/13/2010)   Chol: 176 (06/20/2007)   HDL: 33.7 (06/20/2007)   LDL: 126 (06/20/2007)   TG: 81 (06/20/2007)  Problem # 2:  HYPERLIPIDEMIA (ICD-272.4) Assessment: Unchanged  Her updated medication list for this problem includes:    Lovastatin 20 Mg Tabs (Lovastatin) .Marland Kitchen... Take 1 tab by mouth daily  Problem # 3:  DIABETES MELLITUS, TYPE II (ICD-250.00) Assessment: Unchanged  Her updated medication list for this problem includes:    Enalapril Maleate 10 Mg Tabs (Enalapril  maleate) .Marland Kitchen... 1 qd  Problem # 4:  CERUMEN IMPACTION (ICD-380.4) R  Assessment: Unchanged she will clean at home  Complete Medication List: 1)  Zyrtec Allergy 10 Mg Tabs (Cetirizine hcl) .... As needed 2)  Vitamin D3 1000 Unit Tabs (Cholecalciferol) .Marland Kitchen.. 1 qd 3)  Enalapril Maleate 10 Mg Tabs (Enalapril maleate) .Marland Kitchen.. 1 qd 4)  Lovastatin 20 Mg Tabs (Lovastatin) .... Take 1 tab by mouth daily  Patient Instructions: 1)  Please schedule a follow-up  appointment in 4 months.

## 2010-09-17 NOTE — Assessment & Plan Note (Signed)
Summary: 4 MOS F/U #/ CD   Vital Signs:  Patient profile:   65 year old female Height:      61 inches Weight:      156 pounds BMI:     29.58 Temp:     96.9 degrees F oral Pulse rate:   80 / minute Pulse rhythm:   regular Resp:     16 per minute BP sitting:   128 / 90  (left arm) Cuff size:   regular  Vitals Entered By: Lanier Prude, Beverly Gust) (July 27, 2010 9:27 AM) CC: 4 mo f/u c/o Rt ear itching/painful Is Patient Diabetic? Yes   Primary Care Davari Lopes:  Tresa Garter MD  CC:  4 mo f/u c/o Rt ear itching/painful.  History of Present Illness: C/o R earache F/u GERD  Current Medications (verified): 1)  Zyrtec Allergy 10 Mg  Tabs (Cetirizine Hcl) .... As Needed 2)  Vitamin D3 1000 Unit  Tabs (Cholecalciferol) .Marland Kitchen.. 1 Qd 3)  Enalapril Maleate 10 Mg  Tabs (Enalapril Maleate) .Marland Kitchen.. 1 Qd 4)  Lovastatin 20 Mg Tabs (Lovastatin) .... Take 1 Tab By Mouth Daily  Allergies (verified): No Known Drug Allergies  Past History:  Past Medical History: Last updated: 02/03/2009 Diabetes mellitus, type II GERD fatty liver infiltration elevated liver function test  790.5 chronic T  wave abn ,st  on ekg Colonic polyps, hx of Hyperlipidemia Hypertension R hydradenitis S aureus 01/2009  Physical Exam  General:  Well-developed,well-nourished,in no acute distress; alert,appropriate and cooperative throughout examination Ears:  wax R>>L Nose:  External nasal examination shows no deformity or inflammation. Nasal mucosa are pink and moist without lesions or exudates. Mouth:  Oral mucosa and oropharynx without lesions or exudates.  Teeth in good repair. Lungs:  Normal respiratory effort, chest expands symmetrically. Lungs are clear to auscultation, no crackles or wheezes. Heart:  Normal rate and regular rhythm. S1 and S2 normal without gallop, murmur, click, rub or other extra sounds. Abdomen:  Bowel sounds positive,abdomen soft and non-tender without masses, organomegaly or  hernias noted. Msk:  No deformity or scoliosis noted of thoracic or lumbar spine.   Neurologic:  No cranial nerve deficits noted. Station and gait are normal. Plantar reflexes are down-going bilaterally. DTRs are symmetrical throughout. Sensory, motor and coordinative functions appear intact. Skin:  Clear Psych:  Oriented X3 and normally interactive.     Impression & Recommendations:  Problem # 1:  GERD (ICD-530.81) Assessment Unchanged On the regimen of medicine(s) reflected in the chart    Problem # 2:  OTITIS EXTERNA (ICD-380.10)  Her updated medication list for this problem includes:    Cortisporin 3.5-10000-1 Soln (Neomycin-polymyxin-hc) .Marland KitchenMarland KitchenMarland KitchenMarland Kitchen 3 gtt in r ear tid  Problem # 3:  CERUMEN IMPACTION (ICD-380.4) R Assessment: Deteriorated Removed large ammount  Complete Medication List: 1)  Zyrtec Allergy 10 Mg Tabs (Cetirizine hcl) .... As needed 2)  Vitamin D3 1000 Unit Tabs (Cholecalciferol) .Marland Kitchen.. 1 qd 3)  Enalapril Maleate 10 Mg Tabs (Enalapril maleate) .Marland Kitchen.. 1 qd 4)  Lovastatin 20 Mg Tabs (Lovastatin) .... Take 1 tab by mouth daily 5)  Cortisporin 3.5-10000-1 Soln (Neomycin-polymyxin-hc) .... 3 gtt in r ear tid  Other Orders: Flu Vaccine 75yrs + (36644) Admin 1st Vaccine (03474) TLB-BMP (Basic Metabolic Panel-BMET) (80048-METABOL) TLB-A1C / Hgb A1C (Glycohemoglobin) (83036-A1C)  Patient Instructions: 1)  Please schedule a follow-up appointment in 4 months. Prescriptions: CORTISPORIN 3.5-10000-1 SOLN (NEOMYCIN-POLYMYXIN-HC) 3 gtt in R ear tid  #1 x 1   Entered and Authorized by:  Tresa Garter MD   Signed by:   Tresa Garter MD on 07/27/2010   Method used:   Print then Give to Patient   RxID:   [id number]    Orders Added: 1)  Flu Vaccine 25yrs + [41324] 2)  Admin 1st Vaccine [90471] 3)  TLB-BMP (Basic Metabolic Panel-BMET) [80048-METABOL] 4)  TLB-A1C / Hgb A1C (Glycohemoglobin) [83036-A1C] 5)  Est. Patient Level II [40102]   Immunizations  Administered:  Influenza Vaccine # 1:    Vaccine Type: Fluvax 3+    Site: left deltoid    Mfr: Sanofi Pasteur    Dose: 0.5 ml    Route: IM    Given by: Lanier Prude, CMA(AAMA)    Exp. Date: 02/13/2011    Lot #: [id number]    VIS given: 03/10/10 version given July 27, 2010.   Immunizations Administered:  Influenza Vaccine # 1:    Vaccine Type: Fluvax 3+    Site: left deltoid    Mfr: Sanofi Pasteur    Dose: 0.5 ml    Route: IM    Given by: Lanier Prude, CMA(AAMA)    Exp. Date: 02/13/2011    Lot #: [id number]    VIS given: 03/10/10 version given July 27, 2010.

## 2010-09-23 NOTE — Assessment & Plan Note (Signed)
Summary: cold x3 wks/#/cd   Vital Signs:  Patient profile:   65 year old female Height:      61 inches Weight:      158 pounds BMI:     29.96 Temp:     98.2 degrees F oral Pulse rate:   72 / minute Pulse rhythm:   regular Resp:     16 per minute BP sitting:   120 / 74  (left arm) Cuff size:   regular  Vitals Entered By: Lanier Prude, CMA(AAMA) (September 15, 2010 4:34 PM) CC: cough, chest congestion X 3 wks, burning on urination, urine odor Is Patient Diabetic? Yes   Primary Care Provider:  Georgina Quint Talha Iser MD  CC:  cough, chest congestion X 3 wks, burning on urination, and urine odor.  History of Present Illness: The patient presents with complaints of sore throat, fever, cough, sinus congestion and drainge of several days duration. Not better with OTC meds. Chest hurts with coughing. Can't sleep due to cough. Muscle aches are present.  The mucus is colored.   Current Medications (verified): 1)  Zyrtec Allergy 10 Mg  Tabs (Cetirizine Hcl) .... As Needed 2)  Vitamin D3 1000 Unit  Tabs (Cholecalciferol) .Marland Kitchen.. 1 Qd 3)  Enalapril Maleate 10 Mg  Tabs (Enalapril Maleate) .Marland Kitchen.. 1 Qd 4)  Lovastatin 20 Mg Tabs (Lovastatin) .... Take 1 Tab By Mouth Daily  Allergies (verified): No Known Drug Allergies  Past History:  Past Medical History: Last updated: 02/03/2009 Diabetes mellitus, type II GERD fatty liver infiltration elevated liver function test  790.5 chronic T  wave abn ,st  on ekg Colonic polyps, hx of Hyperlipidemia Hypertension R hydradenitis S aureus 01/2009  Physical Exam  General:  Well-developed,well-nourished,in no acute distress; alert,appropriate and cooperative throughout examination Mouth:  Erythematous throat and intranasal mucosa c/w URI  Lungs:  Normal respiratory effort, chest expands symmetrically. Lungs are clear to auscultation, no crackles or wheezes. Heart:  Normal rate and regular rhythm. S1 and S2 normal without gallop, murmur, click, rub or  other extra sounds.   Impression & Recommendations:  Problem # 1:  UPPER RESPIRATORY INFECTION, ACUTE (ICD-465.9) Assessment New  Her updated medication list for this problem includes:    Zyrtec Allergy 10 Mg Tabs (Cetirizine hcl) .Marland Kitchen... As needed    Promethazine-codeine 6.25-10 Mg/51ml Syrp (Promethazine-codeine) .Marland Kitchen... 5-10 ml by mouth q id as needed cough  Orders: EMR Misc Charge Code [facility])  Complete Medication List: 1)  Zyrtec Allergy 10 Mg Tabs (Cetirizine hcl) .... As needed 2)  Vitamin D3 1000 Unit Tabs (Cholecalciferol) .Marland Kitchen.. 1 qd 3)  Enalapril Maleate 10 Mg Tabs (Enalapril maleate) .Marland Kitchen.. 1 qd 4)  Lovastatin 20 Mg Tabs (Lovastatin) .... Take 1 tab by mouth daily 5)  Amoxicillin 500 Mg Caps (Amoxicillin) .... 2 caps by mouth bid 6)  Promethazine-codeine 6.25-10 Mg/50ml Syrp (Promethazine-codeine) .... 5-10 ml by mouth q id as needed cough  Patient Instructions: 1)  Use over-the-counter medicines for "cold": Tylenol  650mg  or Advil 400mg  every 6 hours  for fever; Delsym or Robutussin for cough. Mucinex or Mucinex D for congestion. Ricola or Halls for sore throat. Office visit if not better or if worse.  Prescriptions: PROMETHAZINE-CODEINE 6.25-10 MG/5ML SYRP (PROMETHAZINE-CODEINE) 5-10 ml by mouth q id as needed cough  #300 ml x 0   Entered and Authorized by:   Tresa Garter MD   Signed by:   Tresa Garter MD on 09/15/2010   Method used:   Print then  Give to Patient   RxID:   [id number] AMOXICILLIN 500 MG CAPS (AMOXICILLIN) 2 caps by mouth bid  #40 x 0   Entered and Authorized by:   Tresa Garter MD   Signed by:   Tresa Garter MD on 09/15/2010   Method used:   Print then Give to Patient   RxID:   [id number]    Orders Added: 1)  EMR Misc Charge Code [EMRMisc]    Laboratory Results   Urine Tests    Routine Urinalysis   Color: yellow Appearance: Clear Glucose: negative   (Normal Range: Negative) Bilirubin: negative   (Normal  Range: Negative) Ketone: negative   (Normal Range: Negative) Spec. Gravity: 1.015   (Normal Range: 1.003-1.035) Blood: negative   (Normal Range: Negative) pH: 6.0   (Normal Range: 5.0-8.0) Protein: trace   (Normal Range: Negative) Nitrite: negative   (Normal Range: Negative) Leukocyte Esterace: negative   (Normal Range: Negative)

## 2010-11-30 ENCOUNTER — Ambulatory Visit (INDEPENDENT_AMBULATORY_CARE_PROVIDER_SITE_OTHER)
Admission: RE | Admit: 2010-11-30 | Discharge: 2010-11-30 | Disposition: A | Discharge status: Home / Self Care | Payer: Self-pay | Source: Ambulatory Visit | Attending: Internal Medicine | Admitting: Internal Medicine

## 2010-11-30 ENCOUNTER — Ambulatory Visit (INDEPENDENT_AMBULATORY_CARE_PROVIDER_SITE_OTHER): Payer: Self-pay | Admitting: Internal Medicine

## 2010-11-30 ENCOUNTER — Telehealth: Payer: Self-pay | Admitting: Internal Medicine

## 2010-11-30 ENCOUNTER — Ambulatory Visit: Admission: RE | Admit: 2010-11-30 | Payer: Self-pay | Source: Ambulatory Visit

## 2010-11-30 ENCOUNTER — Encounter: Payer: Self-pay | Admitting: Internal Medicine

## 2010-11-30 DIAGNOSIS — S6000XA Contusion of unspecified finger without damage to nail, initial encounter: Secondary | ICD-10-CM

## 2010-11-30 DIAGNOSIS — M25519 Pain in unspecified shoulder: Secondary | ICD-10-CM

## 2010-11-30 DIAGNOSIS — M25511 Pain in right shoulder: Secondary | ICD-10-CM

## 2010-11-30 HISTORY — DX: Pain in right shoulder: M25.511

## 2010-11-30 HISTORY — DX: Contusion of unspecified finger without damage to nail, initial encounter: S60.00XA

## 2010-11-30 MED ORDER — IBUPROFEN 600 MG PO TABS: ORAL_TABLET | ORAL | Status: AC

## 2010-11-30 NOTE — Assessment & Plan Note (Signed)
X ray

## 2010-11-30 NOTE — Assessment & Plan Note (Signed)
X ray Stretch NSAIDs

## 2010-11-30 NOTE — Telephone Encounter (Signed)
Monica Horn , please, inform the patient:  the finger xray was ok      Thank you !

## 2010-11-30 NOTE — Patient Instructions (Signed)
Check [email or web address] for shoulder and hip stretches

## 2010-11-30 NOTE — Progress Notes (Signed)
  Subjective:    Patient ID: Monica Horn, female    DOB: 1946/02/19, 65 y.o.   MRN: [id number]  HPI C/o hitting R 4th finger last wk C/o R shoulder pain and decr ROM   Review of Systems  Constitutional: Negative for chills.  HENT: Negative for neck pain.   Musculoskeletal:       R shoulder pain       Objective:   Physical Exam  Constitutional: No distress.  Musculoskeletal:       Shoulder and finger hurt w/ROM (R)        No subungul hematoma  Assessment & Plan:  Finger contusion X ray Splint provided  Shoulder pain, right X ray if not better Stretch NSAIDs

## 2010-12-01 NOTE — Telephone Encounter (Signed)
Pt left vm req results when avail - ok to call and leave VM on [phone]

## 2010-12-01 NOTE — Telephone Encounter (Signed)
Left detailed mess informing pt of below.  

## 2010-12-28 DIAGNOSIS — Z0279 Encounter for issue of other medical certificate: Secondary | ICD-10-CM

## 2010-12-29 NOTE — H&P (Signed)
NAMESANII, KUKLA               ACCOUNT NO.:  [id number]   MEDICAL RECORD NO.:  [id number]          PATIENT TYPE:  EMS   LOCATION:  ED                           FACILITY:  [facility]   PHYSICIAN:  Barbette Hair. Arlyce Dice, MD,FACGDATE OF BIRTH:  October 09, 1945   DATE OF ADMISSION:  04/19/2007  DATE OF DISCHARGE:                              HISTORY & PHYSICAL   PROBLEM:  Rectal bleeding.   HISTORY:  Mrs. Thiem is a very nice 65 year old African American female  generally in good health.  She does have history of hyperlipidemia and  osteoarthritis.  She is status post remote hysterectomy and bilateral  tubal ligation.  She was referred to Dr. Russella Dar on an outpatient basis  for screening colonoscopy which was done at the [facility] on April 13, 2007.  Colonoscopy revealed two polyps in ascending colon.  One was 15 mm  snared with cautery and one in the ascending colon which was more  sessile appearing.  This was removed piecemeal and tattooed as well as  hot biopsied.  The patient did well until about 4:45 a.m. today, when  she was awakened with an urgency for bowel movement and then passed  bright red blood in the commode.  She has had six episodes since that  time, all with bright red blood per rectum.  This last episode she did  pass a few clots.  She has had no associated abdominal pain, nausea,  vomiting, syncope, presyncope, diaphoresis, etc..  She presented to the  emergency room, was hemodynamically stable on arrival.  Hemoglobin was  10.6, and she is admitted to the hospital for supportive medical  management for post polypectomy bleed.   CURRENT MEDICATIONS:  1. Lovastatin 20 daily.  2. Vitamin D 1000 daily.  3. Zyrtec p.r.n.   ALLERGIES:  NO KNOWN DRUG ALLERGIES.   PAST HISTORY:  As outlined above, hyperlipidemia, osteoarthritis status  post hysterectomy and bilateral tubal ligation.   FAMILY HISTORY:  Negative for colon cancer pertinent for diabetes and  CVA.   SOCIAL HISTORY:  The  patient is married.  She works part-time as does  her husband.  They also pastor a church.  No EtOH and no tobacco.   REVIEW OF SYSTEMS:  GI: As outlined above.  The patient denies any chest  pain or anginal symptoms.  No cough, shortness of breath or sputum  production.  All other review of systems negative.   PHYSICAL EXAMINATION:  GENERAL:  Well-developed African American female  in no acute distress.  Temperature is 98.2, blood pressure 136/92, pulse  is 100, sats 98 on room air.  HEENT:  Nontraumatic, normocephalic.  EOMI, PERRLA.  Sclerae anicteric.  NECK:  Supple without nodes.  CARDIOVASCULAR:  Regular rate and rhythm with S1-S2, slightly tachy.  ABDOMEN:  Soft.  Bowel sounds are active.  There is no palpable mass or  hepatosplenomegaly.  LUNGS:  Clear to A and P.  EXTREMITIES:  Without clubbing, cyanosis or edema.  NEUROLOGICAL:  Grossly nonfocal.  RECTAL:  Exam was not done, witnessed bloody stool in the commode.   IMPRESSION:  1. A  65 year old female with acute lower GI bleed 6 days status post      colonoscopy and polypectomy x2.  2. Anemia secondary to above.  3. History of hyperlipidemia.   PLAN:  The patient is admitted to the service of Dr. Melvia Heaps who  was covering the hospital.  She will be kept at bowel rest.  She will be  hydrated. Serial H&H.  Transfusions as indicated.  She will be managed  conservatively in hopes that the bleed will resolve within 24 hours  without any specific intervention.  If she continues to bleed, or  becomes hemodynamically unstable, will pursue further intervention with  a repeat colonoscopy.  For details please see the orders.      Amy Esterwood, PA-C      Robert D. Arlyce Dice, MD,FACG  Electronically Signed    AE/MEDQ  D:  04/19/2007  T:  04/19/2007  Job:  [id number]

## 2010-12-29 NOTE — Assessment & Plan Note (Signed)
[facility]                                 ON-CALL NOTE   NAME:[name], [name]                      MRN:          [id number]  DATE:04/19/2007                            DOB:          May 30, 1946    Patient:  Monica Horn, date of birth 07/21/1946, telephone number  [phone].  The date of the call: April 19, 2007.  The time of the  call:  5:20 a.m.   [name] called this morning stating that she has had several episodes  of rectal bleeding associated with urgency.  No nausea, vomiting,  dizziness, or abdominal pain.  She underwent complete colonoscopy, for  screen purposes, with Dr. Russella Dar on April 13, 2007.  She had several  polyps removed which were of moderate size in the right colon and  required the use of cautery.  I informed the patient that she was almost  certainly having a post polypectomy bleed and that her husband should  take her to the emergency room at [facility] immediately for  evaluation and likely admission.  She understood, agreed, and was  appreciative.     Wilhemina Bonito. Marina Goodell, MD  Electronically Signed    JNP/MedQ  DD: 04/19/2007  DT: 04/19/2007  Job #: [id number]   cc:   Venita Lick. Russella Dar, MD, Clementeen Graham  Iva Boop, MD,FACG

## 2010-12-29 NOTE — Assessment & Plan Note (Signed)
[facility]                         GASTROENTEROLOGY OFFICE NOTE   NAME:Monica Horn, Harral                      MRN:          [id number]  DATE:05/11/2007                            DOB:          08/10/1946    Monica Horn returns for followup after hospitalization for a post  polypectomy bleed.  The history, physical exam, and discharge summary  dated April 19, 2007 through April 21, 2007 are on the chart and I  have reviewed them.  Her ascending colon polyp pathology revealed a  tubulovillous adenoma with focal high grade dysplasia, no carcinoma was  identified.  She has no abdominal complaints and has had no bleeding.   CURRENT MEDICATIONS:  Listed on the chart updated and reviewed.   MEDICATION ALLERGIES:  None known.   PHYSICAL EXAMINATION:  GENERAL:  No acute distress.  VITAL SIGNS:  Height 5 feet 1.5 inches, weight 147 pounds, blood  pressure is 118/80, pulse 84 and regular.  HEENT:  Anicteric sclerae.  Oropharynx clear.  CHEST:  Clear to auscultation bilaterally.  CARDIAC:  Regular rate and rhythm without murmurs.  ABDOMEN:  Soft, nontender, nondistended.  Normoactive bowel sounds.  No  palpable organomegaly, masses, or hernias.   ASSESSMENT/PLAN:  1. Resolved post polypectomy bleed with an anemia secondary to blood      loss.  We will obtain a repeat CBC today.  2. Tubulovillous adenoma with high grade dysplasia.  Plan for recall      colonoscopy in August 2009.     Venita Lick. Russella Dar, MD, [facility]  Electronically Signed    MTS/MedQ  DD: 05/12/2007  DT: 05/12/2007  Job #: [id number]   cc:   Georgina Quint. Plotnikov, MD

## 2010-12-29 NOTE — Discharge Summary (Signed)
[name], Horn               ACCOUNT NO.:  [id number]   MEDICAL RECORD NO.:  [id number]          PATIENT TYPE:  INP   LOCATION:  1331                         FACILITY:  [facility]   PHYSICIAN:  Barbette Hair. Arlyce Dice, MD,FACGDATE OF BIRTH:  17-Sep-1945   DATE OF ADMISSION:  04/19/2007  DATE OF DISCHARGE:  04/21/2007                               DISCHARGE SUMMARY   ADMITTING DIAGNOSES.:  6. A 65 year old African American female with acute lower GI bleed      consistent with post polypectomy hemorrhage status post colonoscopy      and polypectomy x2, 6 days previous.  2. Anemia secondary to above.  3. Hypotension secondary to above.  4. History of hyperlipidemia.   DISCHARGE DIAGNOSES:  1. Resolved acute post polypectomy hemorrhage.  2. Anemia secondary to acute blood loss, stable post transfusion x2.  3. High-grade dysplasia in adenomatous polyp which was removed at the      time of the colonoscopy.  4. Other diagnoses as listed above.   CONSULTATIONS:  None.   PROCEDURES:  None.   BRIEF HISTORY:  Monica Horn is a very nice 65 year old African American  female who had been referred to Dr. Russella Dar for routine screening  colonoscopy.  This was done on August 28 at the [facility].  She did have two  polyps in the ascending colon, one of which was 15 mm which was snared  and cauterized, and the other removed piecemeal and tattooed as well as  hot biopsied.  The patient did well until about 4:45 in the morning of  the day of admission when she awakened from sleep with urgency for a  bowel movement and then passed a large amount of bright red blood in the  commode.  She had about six episodes since onset by the time she was  seen in the emergency room, all with bright red blood.  She did not have  any associated abdominal pain does report some weakness and had a drop  in her blood pressure while in the emergency room.  Hemoglobin was 10.6  on arrival, and she was admitted for supportive medical  management for  post polypectomy hemorrhage.   LABORATORY STUDIES:  On admission, WBC of 6.3, hemoglobin 10.6,  hematocrit of 32.8, MCV was 73, protime 13.2, INR of 1.  Electrolytes  within normal limits.  BUN 16, creatinine 0.8, liver function studies  normal.  Serial H&H's were obtained.  Her hemoglobin dropped to 8.1  later in the day, hematocrit of 24.8.  She was transfused with  hemoglobin of 9.8 and 29.5 on the fourth and on September 5, hemoglobin  9.7, hematocrit of 29.3.   HOSPITAL COURSE:  The patient was admitted to the service of Dr. Melvia Heaps who was covering the hospital.  She had two IVs placed, did  require a fluid bolus for an episode of hypotension while in the  emergency room and continued to actively bleed for about the first 12  hours of admission.  She did drop her hemoglobin to 8.1 and was  transfused 2 units of packed cells.  She was  watched in step-down  overnight and fortunately her bleeding resolved without any  intervention.  She responded appropriately to the transfusions and did  not have any further active bleeding.  She was observed another 24 hours  on regular floor.  Her diet was gradually advance.  She tolerated this  without difficulty, and on the 5th, she was felt to be stable with a  hemoglobin of 9.7 and allowed discharge to home.  She is to follow up  with Dr. Russella Dar in the office in approximately 2 weeks, to call for any  problems in the interim, we will need to check a follow-up hemoglobin at  the time of our office visit.  Also, she will need follow-up colonoscopy  within the next one year because of the high-grade dysplasia found on  pathology.  This was discussed with the patient.  She was to avoid all  aspirin and anti-inflammatory medications and to resume her vitamin D  and lovastatin as previous.  Diet low residue for the next week, and she  was asked to avoid strenuous activity over the next week.      Amy Esterwood,  PA-C      Robert D. Arlyce Dice, MD,FACG  Electronically Signed    AE/MEDQ  D:  04/21/2007  T:  04/21/2007  Job:  [id number]

## 2011-01-01 NOTE — Assessment & Plan Note (Signed)
[facility]                             PRIMARY CARE OFFICE NOTE   NAME:Monica Horn, Monica Horn                      MRN:          [id number]  DATE:04/11/2006                            DOB:          [birth year]    Patient is a 65 year old female who presents for wellness examination.   Past medical history, family history, and social history as per July 29, 2004 note and August 14, 2004 note.   MEDICATIONS:  Reviewed.   No known drug allergies.   REVIEW OF SYSTEMS:  Complains of a sore spot on the right labia.  No urinary  symptoms.  Numbness in the right hand, especially after typing or at night.  The rest is negative.   PHYSICAL EXAMINATION:  VITAL SIGNS:  Blood pressure 122/82, pulse 91, temp  97.6.  Weight 162 pounds.  GENERAL:  She looks well.  No acute distress.  HEENT:  Moist mucosa.  NECK:  Supple.  No thyromegaly or bruits.  LUNGS:  Clear.  No wheezes or rales.  HEART:  S1 and S2.  No murmur or gallop.  ABDOMEN:  Soft and nontender.  No adnexa.  No masses felt.  EXTREMITIES:  Lower extremities without edema.  NEUROLOGIC:  She is alert and appropriate.  Denies being depressed.  BREASTS:  Symmetric.  Normal.  Nipples without discharge.  No masses.  NODES:  No lymphadenopathy.  GENITOURINARY:  Normal external genitalia.  Cervix visualized.  Small polyp  in the cervical orifice observed.  Pap smear obtained.  Bimanual examination  is normal.  No masses.  Rectal is normal.  Guaiac negative stool.  No  masses.  Right labia has an infiltrative papule measuring about 1 cm, tender  to touch.  No blisters.  No lymphadenopathy or hernias.   Labs on April 06, 2006, CBC normal.  Glucose 114, cholesterol 202, LDL 140,  TSH normal.  Urinalysis with 5-10 WBCs.   EKG normal.   ASSESSMENT/PLAN:  1. Normal wellness examination:  Age/health-related issues discussed.      Healthy lifestyle discussed.  Mammogram once a year.  Vaccinations  discussed.  Pap smear obtained.  2. Urinary tract infection, asymptomatic:  Cipro 250 p.o. daily.  3. Right carpal tunnel syndrome.  The examination was normal.  Given a      splint to wear at work or at night.  4. Abscess on the right labia:  Cipro should be helpful.  If no better,      Keflex 500 2 twice daily for 10 days.   I will see her back in three months.                                   Sonda Primes, MD   AP/MedQ  DD:  04/12/2006  DT:  04/12/2006  Job #:  [id number]

## 2011-01-21 ENCOUNTER — Other Ambulatory Visit: Payer: Self-pay | Admitting: Internal Medicine

## 2011-01-21 DIAGNOSIS — Z1231 Encounter for screening mammogram for malignant neoplasm of breast: Secondary | ICD-10-CM

## 2011-02-02 ENCOUNTER — Ambulatory Visit (HOSPITAL_COMMUNITY)
Admission: RE | Admit: 2011-02-02 | Discharge: 2011-02-02 | Disposition: A | Discharge status: Home / Self Care | Payer: Self-pay | Source: Ambulatory Visit | Attending: Internal Medicine | Admitting: Internal Medicine

## 2011-02-02 DIAGNOSIS — Z1231 Encounter for screening mammogram for malignant neoplasm of breast: Secondary | ICD-10-CM

## 2011-02-03 ENCOUNTER — Encounter: Payer: Self-pay | Admitting: Internal Medicine

## 2011-02-03 ENCOUNTER — Telehealth: Payer: Self-pay | Admitting: Internal Medicine

## 2011-02-03 ENCOUNTER — Other Ambulatory Visit (INDEPENDENT_AMBULATORY_CARE_PROVIDER_SITE_OTHER): Payer: Self-pay

## 2011-02-03 ENCOUNTER — Other Ambulatory Visit: Payer: Self-pay | Admitting: Internal Medicine

## 2011-02-03 ENCOUNTER — Ambulatory Visit (INDEPENDENT_AMBULATORY_CARE_PROVIDER_SITE_OTHER): Payer: Self-pay | Admitting: Internal Medicine

## 2011-02-03 DIAGNOSIS — I10 Essential (primary) hypertension: Secondary | ICD-10-CM

## 2011-02-03 DIAGNOSIS — G5600 Carpal tunnel syndrome, unspecified upper limb: Secondary | ICD-10-CM

## 2011-02-03 DIAGNOSIS — E119 Type 2 diabetes mellitus without complications: Secondary | ICD-10-CM

## 2011-02-03 LAB — COMPREHENSIVE METABOLIC PANEL
AST: 32 U/L (ref 0–37)
Alkaline Phosphatase: 107 U/L (ref 39–117)
BUN: 16 mg/dL (ref 6–23)
Glucose, Bld: 103 mg/dL — ABNORMAL HIGH (ref 70–99)
Total Bilirubin: 0.7 mg/dL (ref 0.3–1.2)

## 2011-02-03 LAB — LIPID PANEL
Cholesterol: 207 mg/dL — ABNORMAL HIGH (ref 0–200)
HDL: 40.6 mg/dL (ref >39.00)
Triglycerides: 135 mg/dL (ref 0.0–149.0)
VLDL: 27 mg/dL (ref 0.0–40.0)

## 2011-02-03 LAB — HEMOGLOBIN A1C: Hgb A1c MFr Bld: 6.4 % (ref 4.6–6.5)

## 2011-02-03 NOTE — Telephone Encounter (Signed)
Stacey , please, inform the patient: labs are OK   Please, keep  next office visit appointment.   Thank you !   

## 2011-02-03 NOTE — Assessment & Plan Note (Signed)
  On diet  

## 2011-02-03 NOTE — Assessment & Plan Note (Signed)
On NSAIDs

## 2011-02-03 NOTE — Assessment & Plan Note (Signed)
On Rx 

## 2011-02-03 NOTE — Progress Notes (Signed)
  Subjective:    Patient ID: Monica Horn, female    DOB: May 22, 1946, 65 y.o.   MRN: [id number]  HPI  F/u HTN and dyslipidemia  Review of Systems  Constitutional: Negative for chills, activity change, appetite change, fatigue and unexpected weight change.  HENT: Negative for congestion, mouth sores and sinus pressure.   Eyes: Negative for visual disturbance.  Respiratory: Negative for cough and chest tightness.   Gastrointestinal: Negative for nausea and abdominal pain.  Genitourinary: Negative for frequency, difficulty urinating and vaginal pain.  Musculoskeletal: Negative for back pain and gait problem.  Skin: Negative for pallor and rash.  Neurological: Positive for numbness (R hand chronic). Negative for dizziness, tremors, weakness and headaches.  Psychiatric/Behavioral: Negative for confusion and sleep disturbance.       Objective:   Physical Exam  Constitutional: She appears well-developed and well-nourished. No distress.  HENT:  Head: Normocephalic.  Right Ear: External ear normal.  Left Ear: External ear normal.  Nose: Nose normal.  Mouth/Throat: Oropharynx is clear and moist.  Eyes: Conjunctivae are normal. Pupils are equal, round, and reactive to light. Right eye exhibits no discharge. Left eye exhibits no discharge.  Neck: Normal range of motion. Neck supple. No JVD present. No tracheal deviation present. No thyromegaly present.  Cardiovascular: Normal rate, regular rhythm and normal heart sounds.   Pulmonary/Chest: No stridor. No respiratory distress. She has no wheezes.  Abdominal: Soft. Bowel sounds are normal. She exhibits no distension and no mass. There is no tenderness. There is no rebound and no guarding.  Musculoskeletal: She exhibits no edema and no tenderness.  Lymphadenopathy:    She has no cervical adenopathy.  Neurological: She displays normal reflexes. No cranial nerve deficit. She exhibits normal muscle tone. Coordination normal.  Skin: No rash  noted. No erythema.  Psychiatric: She has a normal mood and affect. Her behavior is normal. Judgment and thought content normal.          Assessment & Plan:

## 2011-02-04 ENCOUNTER — Telehealth: Payer: Self-pay | Admitting: Internal Medicine

## 2011-02-04 NOTE — Telephone Encounter (Signed)
Stacey , please, inform the patient: labs are OK   Please, keep  next office visit appointment.   Thank you !   

## 2011-02-04 NOTE — Telephone Encounter (Signed)
Pt informed

## 2011-02-05 NOTE — Telephone Encounter (Signed)
Pt informed

## 2011-04-13 ENCOUNTER — Other Ambulatory Visit: Payer: Self-pay | Admitting: Internal Medicine

## 2011-05-21 LAB — DIFFERENTIAL
Basophils Absolute: 0 10*3/uL (ref 0.0–0.1)
Basophils Relative: 0 % (ref 0–1)
Eosinophils Absolute: 0 10*3/uL (ref 0.0–0.7)
Monocytes Relative: 10 % (ref 3–12)
Neutrophils Relative %: 82 % — ABNORMAL HIGH (ref 43–77)

## 2011-05-21 LAB — POCT I-STAT, CHEM 8
Glucose, Bld: 122 mg/dL — ABNORMAL HIGH (ref 70–99)
Potassium: 3.6 mEq/L (ref 3.5–5.1)
TCO2: 26 mmol/L (ref 0–100)

## 2011-05-21 LAB — CBC
MCHC: 31.7 g/dL (ref 30.0–36.0)
MCV: 76.2 fL — ABNORMAL LOW (ref 78.0–100.0)
Platelets: 334 10*3/uL (ref 150–400)
RBC: 5.44 MIL/uL — ABNORMAL HIGH (ref 3.87–5.11)
RDW: 13.9 % (ref 11.5–15.5)

## 2011-05-28 LAB — HEMOGLOBIN AND HEMATOCRIT, BLOOD
HCT: 24.8 — ABNORMAL LOW
HCT: 29.3 — ABNORMAL LOW
HCT: 29.8 — ABNORMAL LOW
Hemoglobin: 10 — ABNORMAL LOW
Hemoglobin: 10.3 — ABNORMAL LOW
Hemoglobin: 8.1 — ABNORMAL LOW

## 2011-05-28 LAB — CBC
Platelets: 300
RDW: 13.2

## 2011-05-28 LAB — PROTIME-INR
INR: 1
Prothrombin Time: 13.2

## 2011-05-28 LAB — COMPREHENSIVE METABOLIC PANEL
ALT: 26
Albumin: 3.6
Alkaline Phosphatase: 94
Calcium: 8.9
Potassium: 3.8
Sodium: 133 — ABNORMAL LOW
Total Protein: 6.5

## 2011-05-28 LAB — DIFFERENTIAL
Basophils Relative: 1
Eosinophils Absolute: 0.1
Lymphs Abs: 1.9
Monocytes Absolute: 0.7
Monocytes Relative: 12 — ABNORMAL HIGH
Neutro Abs: 3.4

## 2011-05-28 LAB — ABO/RH: ABO/RH(D): A POS

## 2011-05-28 LAB — TYPE AND SCREEN

## 2011-05-28 LAB — APTT: aPTT: 27

## 2011-06-11 ENCOUNTER — Encounter: Payer: Self-pay | Admitting: Internal Medicine

## 2011-06-11 ENCOUNTER — Ambulatory Visit (INDEPENDENT_AMBULATORY_CARE_PROVIDER_SITE_OTHER): Payer: Self-pay | Admitting: Internal Medicine

## 2011-06-11 VITALS — BP 158/90 | HR 88 | Temp 98.6°F | Resp 16 | Wt 161.0 lb

## 2011-06-11 DIAGNOSIS — J069 Acute upper respiratory infection, unspecified: Secondary | ICD-10-CM

## 2011-06-11 HISTORY — DX: Acute upper respiratory infection, unspecified: J06.9

## 2011-06-11 MED ORDER — PROMETHAZINE-CODEINE 6.25-10 MG/5ML PO SYRP: 5.0000 mL | ORAL_SOLUTION | Freq: Four times a day (QID) | ORAL | Status: DC | PRN

## 2011-06-11 MED ORDER — AMOXICILLIN 500 MG PO CAPS: 1000.0000 mg | ORAL_CAPSULE | Freq: Two times a day (BID) | ORAL | Status: AC

## 2011-06-11 NOTE — Patient Instructions (Signed)
Use over-the-counter  "cold" medicines  such as "Tylenol cold" , "Advil cold",  "Mucinex" or" Mucinex D"  for cough and congestion."Afrin" nasal spray for nasal congestion as directed instead. Use" Delsym" or" Robitussin" cough syrup varietis for cough.  You can use plain "Tylenol" or "Advi"l for fever, chills and achyness.

## 2011-06-11 NOTE — Assessment & Plan Note (Signed)
See meds 

## 2011-06-11 NOTE — Progress Notes (Signed)
  Subjective:    Patient ID: Monica Horn, female    DOB: Nov 08, 1945, 65 y.o.   MRN: [id number]  HPI   HPI  C/o URI sx's x 7  days. C/o ST, cough, weakness. Not better with OTC medicines. Actually, the patient is getting worse. The patient did not sleep last night due to cough.  Review of Systems  Constitutional: Positive for fever, chills and fatigue.  HENT: Positive for congestion, rhinorrhea, sneezing and postnasal drip.   Eyes: Positive for photophobia and pain. Negative for discharge and visual disturbance.  Respiratory: Positive for cough and wheezing.   Positive for chest pain.  Gastrointestinal: Negative for vomiting, abdominal pain, diarrhea and abdominal distention.  Genitourinary: Negative for dysuria and difficulty urinating.  Skin: Negative for rash.  Neurological: Positive for dizziness, weakness and light-headedness.      Review of Systems  Constitutional: Negative for chills, activity change, appetite change, fatigue and unexpected weight change.  HENT: Positive for congestion and sinus pressure. Negative for mouth sores.   Eyes: Negative for visual disturbance.  Respiratory: Positive for cough. Negative for chest tightness.   Gastrointestinal: Negative for nausea and abdominal pain.  Genitourinary: Negative for frequency, difficulty urinating and vaginal pain.  Musculoskeletal: Negative for back pain and gait problem.  Skin: Negative for pallor and rash.  Neurological: Negative for dizziness, tremors, weakness, numbness and headaches.  Psychiatric/Behavioral: Negative for confusion and sleep disturbance.       Objective:   Physical Exam  Constitutional: She appears well-developed and well-nourished. No distress.  HENT:  Head: Normocephalic.  Right Ear: External ear normal.  Left Ear: External ear normal.  Nose: Nose normal.       eryth throat  Eyes: Conjunctivae are normal. Pupils are equal, round, and reactive to light. Right eye exhibits no discharge.  Left eye exhibits no discharge.  Neck: Normal range of motion. Neck supple. No JVD present. No tracheal deviation present. No thyromegaly present.  Cardiovascular: Normal rate, regular rhythm and normal heart sounds.   Pulmonary/Chest: No stridor. No respiratory distress. She has no wheezes.  Abdominal: Soft. Bowel sounds are normal. She exhibits no distension and no mass. There is no tenderness. There is no rebound and no guarding.  Musculoskeletal: She exhibits no edema and no tenderness.  Lymphadenopathy:    She has no cervical adenopathy.  Neurological: She displays normal reflexes. No cranial nerve deficit. She exhibits normal muscle tone. Coordination normal.  Skin: No rash noted. No erythema.  Psychiatric: She has a normal mood and affect. Her behavior is normal. Judgment and thought content normal.          Assessment & Plan:

## 2011-08-04 ENCOUNTER — Other Ambulatory Visit: Payer: Self-pay | Admitting: Internal Medicine

## 2011-10-08 ENCOUNTER — Encounter: Payer: Self-pay | Admitting: Internal Medicine

## 2011-10-08 ENCOUNTER — Ambulatory Visit (INDEPENDENT_AMBULATORY_CARE_PROVIDER_SITE_OTHER): Payer: Self-pay | Admitting: Internal Medicine

## 2011-10-08 ENCOUNTER — Encounter: Payer: Self-pay | Admitting: Gastroenterology

## 2011-10-08 VITALS — BP 120/80 | HR 88 | Temp 97.9°F | Resp 16 | Wt 162.0 lb

## 2011-10-08 DIAGNOSIS — K219 Gastro-esophageal reflux disease without esophagitis: Secondary | ICD-10-CM

## 2011-10-08 DIAGNOSIS — E785 Hyperlipidemia, unspecified: Secondary | ICD-10-CM

## 2011-10-08 DIAGNOSIS — E119 Type 2 diabetes mellitus without complications: Secondary | ICD-10-CM

## 2011-10-08 DIAGNOSIS — K921 Melena: Secondary | ICD-10-CM

## 2011-10-08 MED ORDER — ENALAPRIL MALEATE 10 MG PO TABS: 10.0000 mg | ORAL_TABLET | Freq: Every day | ORAL | Status: DC

## 2011-10-08 MED ORDER — CETIRIZINE HCL 10 MG PO TABS: 10.0000 mg | ORAL_TABLET | Freq: Every day | ORAL | Status: DC | PRN

## 2011-10-08 MED ORDER — LOVASTATIN 20 MG PO TABS: 20.0000 mg | ORAL_TABLET | Freq: Every day | ORAL | Status: DC

## 2011-10-08 NOTE — Assessment & Plan Note (Signed)
Cont w/diet 

## 2011-10-08 NOTE — Progress Notes (Signed)
Patient ID: Monica Horn, female   DOB: December 02, 1945, 66 y.o.   MRN: [id number]  Subjective:    Patient ID: Monica Horn, female    DOB: 1946/02/06, 66 y.o.   MRN: [id number]  Rectal Bleeding  Pertinent negatives include no abdominal pain, no nausea, no headaches, no coughing and no rash.    F/u HTN and dyslipidemia C/o blood in stool off and on last month C/o R shoulder pain  Wt Readings from Last 3 Encounters:  10/08/11 162 lb (73.483 kg)  06/11/11 161 lb (73.029 kg)  02/03/11 156 lb (70.761 kg)    BP Readings from Last 3 Encounters:  10/08/11 120/80  06/11/11 158/90  02/03/11 120/86     Review of Systems  Constitutional: Negative for chills, activity change, appetite change, fatigue and unexpected weight change.  HENT: Negative for congestion, mouth sores and sinus pressure.   Eyes: Negative for visual disturbance.  Respiratory: Negative for cough and chest tightness.   Gastrointestinal: Positive for hematochezia. Negative for nausea and abdominal pain.  Genitourinary: Negative for frequency, difficulty urinating and vaginal pain.  Musculoskeletal: Negative for back pain and gait problem.  Skin: Negative for pallor and rash.  Neurological: Positive for numbness (R hand chronic). Negative for dizziness, tremors, weakness and headaches.  Psychiatric/Behavioral: Negative for confusion and sleep disturbance.       Objective:   Physical Exam  Constitutional: She appears well-developed and well-nourished. No distress.  HENT:  Head: Normocephalic.  Right Ear: External ear normal.  Left Ear: External ear normal.  Nose: Nose normal.  Mouth/Throat: Oropharynx is clear and moist.  Eyes: Conjunctivae are normal. Pupils are equal, round, and reactive to light. Right eye exhibits no discharge. Left eye exhibits no discharge.  Neck: Normal range of motion. Neck supple. No JVD present. No tracheal deviation present. No thyromegaly present.  Cardiovascular: Normal rate, regular  rhythm and normal heart sounds.   Pulmonary/Chest: No stridor. No respiratory distress. She has no wheezes.  Abdominal: Soft. Bowel sounds are normal. She exhibits no distension and no mass. There is no tenderness. There is no rebound and no guarding.  Musculoskeletal: She exhibits no edema and no tenderness.  Lymphadenopathy:    She has no cervical adenopathy.  Neurological: She displays normal reflexes. No cranial nerve deficit. She exhibits normal muscle tone. Coordination normal.  Skin: No rash noted. No erythema.  Psychiatric: She has a normal mood and affect. Her behavior is normal. Judgment and thought content normal.  R ear wax        Assessment & Plan:

## 2011-10-08 NOTE — Assessment & Plan Note (Addendum)
F/u appt w/Dr Russella Dar

## 2011-10-08 NOTE — Assessment & Plan Note (Signed)
Continue with current prescription therapy as reflected on the Med list.  

## 2011-10-08 NOTE — Assessment & Plan Note (Signed)
Prn meds 

## 2011-10-26 ENCOUNTER — Telehealth: Payer: Self-pay

## 2011-10-29 ENCOUNTER — Other Ambulatory Visit: Payer: Self-pay | Admitting: *Deleted

## 2011-10-29 ENCOUNTER — Other Ambulatory Visit: Payer: Self-pay

## 2011-10-29 MED ORDER — ENALAPRIL MALEATE 10 MG PO TABS: 10.0000 mg | ORAL_TABLET | Freq: Every day | ORAL | Status: DC

## 2011-10-29 MED ORDER — LOVASTATIN 20 MG PO TABS: 20.0000 mg | ORAL_TABLET | Freq: Every day | ORAL | Status: DC

## 2011-10-29 NOTE — Telephone Encounter (Signed)
Pt called requesting eRx to pharmacy

## 2011-11-01 ENCOUNTER — Ambulatory Visit: Payer: Self-pay | Admitting: Gastroenterology

## 2011-11-03 NOTE — Telephone Encounter (Signed)
error 

## 2011-12-01 ENCOUNTER — Telehealth: Payer: Self-pay | Admitting: *Deleted

## 2011-12-01 MED ORDER — ENALAPRIL MALEATE 10 MG PO TABS: 10.0000 mg | ORAL_TABLET | Freq: Every day | ORAL | Status: DC

## 2011-12-01 MED ORDER — LOVASTATIN 20 MG PO TABS: 20.0000 mg | ORAL_TABLET | Freq: Every day | ORAL | Status: DC

## 2011-12-01 NOTE — Telephone Encounter (Signed)
Ok #90 and 3 ref Thx

## 2011-12-01 NOTE — Telephone Encounter (Signed)
Pt is requesting new rx for Pantoprazole 40 mg #90 instead of Nexium to be sent to [facility]. Please advise.

## 2011-12-02 MED ORDER — PANTOPRAZOLE SODIUM 40 MG PO TBEC: 40.0000 mg | DELAYED_RELEASE_TABLET | Freq: Every day | ORAL | Status: DC

## 2011-12-02 NOTE — Telephone Encounter (Signed)
Done

## 2011-12-09 ENCOUNTER — Encounter: Payer: Self-pay | Admitting: Gastroenterology

## 2011-12-09 ENCOUNTER — Ambulatory Visit (INDEPENDENT_AMBULATORY_CARE_PROVIDER_SITE_OTHER): Payer: MEDICARE | Admitting: Gastroenterology

## 2011-12-09 VITALS — BP 116/74 | HR 92 | Ht 61.0 in | Wt 157.6 lb

## 2011-12-09 DIAGNOSIS — K921 Melena: Secondary | ICD-10-CM

## 2011-12-09 DIAGNOSIS — K5900 Constipation, unspecified: Secondary | ICD-10-CM

## 2011-12-09 DIAGNOSIS — Z86010 Personal history of colonic polyps: Secondary | ICD-10-CM

## 2011-12-09 MED ORDER — MOVIPREP 100 G PO SOLR: 1.0000 | Freq: Once | ORAL | Status: DC

## 2011-12-09 NOTE — Patient Instructions (Signed)
You have been scheduled for a colonoscopy with propofol. Please follow written instructions given to you at your visit today.  Please pick up your prep kit at the pharmacy within the next 1-3 days. cc: Sonda Primes, MD

## 2011-12-09 NOTE — Progress Notes (Signed)
History of Present Illness: This is a 66 year old female with a history of tubulovillous adenoma with high-grade dysplasia and internal and external hemorrhoids in 2008. Followup colonoscopy in 2009 had 1 tubular adenoma. She has had intermittent problems with constipation. Had one significant episode of constipation several weeks ago and noted a small amount of bright red blood per rectum with bowel movements for 2 days. This has since resolved. Denies weight loss, abdominal pain, diarrhea, change in stool caliber, melena, nausea, vomiting, dysphagia, reflux symptoms, chest pain.  Review of Systems: Pertinent positive and negative review of systems were noted in the above HPI section. All other review of systems were otherwise negative.  Current Medications, Allergies, Past Medical History, Past Surgical History, Family History and Social History were reviewed in [employer] record.  Physical Exam: General: Well developed , well nourished, no acute distress Head: Normocephalic and atraumatic Eyes:  sclerae anicteric, EOMI Ears: Normal auditory acuity Mouth: No deformity or lesions Neck: Supple, no masses or thyromegaly Lungs: Clear throughout to auscultation Heart: Regular rate and rhythm; no murmurs, rubs or bruits Abdomen: Soft, non tender and non distended. No masses, hepatosplenomegaly or hernias noted. Normal Bowel sounds Rectal: Deferred to colonoscopy Musculoskeletal: Symmetrical with no gross deformities  Skin: No lesions on visible extremities Pulses:  Normal pulses noted Extremities: No clubbing, cyanosis, edema or deformities noted Neurological: Alert oriented x 4, grossly nonfocal Cervical Nodes:  No significant cervical adenopathy Inguinal Nodes: No significant inguinal adenopathy Psychological:  Alert and cooperative. Normal mood and affect  Assessment and Recommendations:  1. Personal history of a tubulovillous adenoma with high-grade dysplasia.  Schedule surveillance colonoscopy. The risks, benefits, and alternatives to colonoscopy with possible biopsy and possible polypectomy were discussed with the patient and they consent to proceed.   2. Intermittent constipation with small volume hematochezia recently. Known internal and external hemorrhoids. I suspect this is hemorrhoidal bleeding. Further evaluation colonoscopy.

## 2011-12-14 ENCOUNTER — Telehealth: Payer: Self-pay | Admitting: *Deleted

## 2011-12-14 MED ORDER — TRIAMCINOLONE ACETONIDE 0.5 % EX CREA: TOPICAL_CREAM | Freq: Three times a day (TID) | CUTANEOUS | Status: DC

## 2011-12-14 NOTE — Telephone Encounter (Signed)
Called pt no answer LMOM rx sent to [facility]... 12/14/11@11 :18am/LB

## 2011-12-14 NOTE — Telephone Encounter (Signed)
Left msg on triage stating md normally send cream in for her poison ivy. Requesting cream to be sent to [facility]... 12/14/11@9 :16am/LMB

## 2011-12-14 NOTE — Telephone Encounter (Signed)
Done: Triamc cream Thx

## 2012-01-03 ENCOUNTER — Other Ambulatory Visit: Payer: Self-pay | Admitting: Internal Medicine

## 2012-01-03 DIAGNOSIS — Z1231 Encounter for screening mammogram for malignant neoplasm of breast: Secondary | ICD-10-CM

## 2012-01-05 ENCOUNTER — Encounter: Payer: Self-pay | Admitting: Gastroenterology

## 2012-01-05 ENCOUNTER — Ambulatory Visit (AMBULATORY_SURGERY_CENTER): Payer: MEDICARE | Admitting: Gastroenterology

## 2012-01-05 VITALS — BP 152/94 | HR 90 | Temp 98.9°F | Resp 18 | Ht 61.0 in | Wt 157.0 lb

## 2012-01-05 DIAGNOSIS — Z86010 Personal history of colonic polyps: Secondary | ICD-10-CM

## 2012-01-05 DIAGNOSIS — Z1211 Encounter for screening for malignant neoplasm of colon: Secondary | ICD-10-CM

## 2012-01-05 MED ORDER — SODIUM CHLORIDE 0.9 % IV SOLN: 500.0000 mL | INTRAVENOUS | Status: DC

## 2012-01-05 NOTE — Op Note (Signed)
[facility] [address] [place], [place]  [address]  COLONOSCOPY PROCEDURE REPORT  PATIENT:  Monica, Horn  MR#:  [id number] BIRTHDATE:  13-Jun-1946, 66 yrs. old  GENDER:  female ENDOSCOPIST:  Judie Petit T. Russella Dar, MD, [facility]  PROCEDURE DATE:  01/05/2012 PROCEDURE:  Colonoscopy 19147 ASA CLASS:  Class II INDICATIONS:  1) surveillance and high-risk screening  2) history of adenomatous colon polyps: TVA with HGD 03/2007 MEDICATIONS:   MAC sedation, administered by CRNA, propofol (Diprivan) 200 mg IV DESCRIPTION OF PROCEDURE:   After the risks benefits and alternatives of the procedure were thoroughly explained, informed consent was obtained.  Digital rectal exam was performed and revealed moderate external hemorrhoids.   The LB CF-[id number] endoscope was introduced through the anus and advanced to the cecum, which was identified by both the appendix and ileocecal valve, without limitations.  The quality of the prep was excellent, using MoviPrep.  The instrument was then slowly withdrawn as the colon was fully examined. <<PROCEDUREIMAGES>> FINDINGS:  Other finding at the hepatic flexure: tattoo and scar at polypectomy  site  Otherwise normal colonoscopy without other polyps, masses, vascular ectasias, or inflammatory changes. Retroflexed views in the rectum revealed internal hemorrhoids, small.  The time to cecum =  3.75  minutes. The scope was then withdrawn (time =  9.5  min) from the patient and the procedure completed.  COMPLICATIONS:  None  ENDOSCOPIC IMPRESSION: 1) Tattoo and polypectomy site at the hepatic flexure 2) Internal and external hemorrhoids  RECOMMENDATIONS: 1) Repeat Colonoscopy in 3 years.  Venita Lick. Russella Dar, MD, Clementeen Graham  n. eSIGNED:   Venita Lick. Leah Thornberry at 01/05/2012 02:23 PM  [place], [place], [phone]

## 2012-01-05 NOTE — Progress Notes (Signed)
Patient did not experience any of the following events: a burn prior to discharge; a fall within the facility; wrong site/side/patient/procedure/implant event; or a hospital transfer or hospital admission upon discharge from the facility. (G8907) Patient did not have preoperative order for IV antibiotic SSI prophylaxis. (G8918)  

## 2012-01-05 NOTE — Patient Instructions (Signed)

## 2012-01-06 ENCOUNTER — Telehealth: Payer: Self-pay | Admitting: *Deleted

## 2012-01-06 NOTE — Telephone Encounter (Signed)
  Follow up Call-  Call back number 01/05/2012  Post procedure Call Back phone  # [phone]  Permission to leave phone message Yes     Patient questions:  Do you have a fever, pain , or abdominal swelling? no Pain Score  0 *  Have you tolerated food without any problems? yes  Have you been able to return to your normal activities? yes  Do you have any questions about your discharge instructions: Diet   no Medications  no Follow up visit  no  Do you have questions or concerns about your Care? no  Actions: * If pain score is 4 or above: No action needed, pain <4.

## 2012-02-03 ENCOUNTER — Ambulatory Visit (HOSPITAL_COMMUNITY)
Admission: RE | Admit: 2012-02-03 | Discharge: 2012-02-03 | Disposition: A | Discharge status: Home / Self Care | Payer: Medicare Other | Source: Ambulatory Visit | Attending: Internal Medicine | Admitting: Internal Medicine

## 2012-02-03 DIAGNOSIS — Z1231 Encounter for screening mammogram for malignant neoplasm of breast: Secondary | ICD-10-CM | POA: Insufficient documentation

## 2012-02-11 ENCOUNTER — Ambulatory Visit (INDEPENDENT_AMBULATORY_CARE_PROVIDER_SITE_OTHER): Payer: Medicare Other | Admitting: Internal Medicine

## 2012-02-11 ENCOUNTER — Encounter: Payer: Self-pay | Admitting: Internal Medicine

## 2012-02-11 VITALS — BP 118/76 | HR 16 | Temp 97.6°F | Resp 98 | Wt 150.0 lb

## 2012-02-11 DIAGNOSIS — E119 Type 2 diabetes mellitus without complications: Secondary | ICD-10-CM

## 2012-02-11 DIAGNOSIS — K219 Gastro-esophageal reflux disease without esophagitis: Secondary | ICD-10-CM

## 2012-02-11 DIAGNOSIS — E785 Hyperlipidemia, unspecified: Secondary | ICD-10-CM

## 2012-02-11 DIAGNOSIS — I10 Essential (primary) hypertension: Secondary | ICD-10-CM

## 2012-02-11 NOTE — Assessment & Plan Note (Signed)
Continue with current prescription therapy as reflected on the Med list.  

## 2012-02-11 NOTE — Progress Notes (Signed)
  Subjective:    Patient ID: Monica Horn, female    DOB: [birth year], 66 y.o.   MRN: [id number]  HPI  F/u HTN and dyslipidemia C/o blood in stool off and on last month f/u R shoulder pain - better C/o aches  Wt Readings from Last 3 Encounters:  02/11/12 150 lb (68.04 kg)  01/05/12 157 lb (71.215 kg)  12/09/11 157 lb 9.6 oz (71.487 kg)    BP Readings from Last 3 Encounters:  02/11/12 118/76  01/05/12 152/94  12/09/11 116/74     Review of Systems  Constitutional: Negative for chills, activity change, appetite change, fatigue and unexpected weight change.  HENT: Negative for congestion, mouth sores and sinus pressure.   Eyes: Negative for visual disturbance.  Respiratory: Negative for chest tightness.   Genitourinary: Negative for frequency, difficulty urinating and vaginal pain.  Musculoskeletal: Negative for back pain and gait problem.  Skin: Negative for pallor.  Neurological: Positive for numbness (R hand chronic). Negative for dizziness, tremors and weakness.  Psychiatric/Behavioral: Negative for confusion and disturbed wake/sleep cycle.       Objective:   Physical Exam  Constitutional: She appears well-developed and well-nourished. No distress.  HENT:  Head: Normocephalic.  Right Ear: External ear normal.  Left Ear: External ear normal.  Nose: Nose normal.  Mouth/Throat: Oropharynx is clear and moist.  Eyes: Conjunctivae are normal. Pupils are equal, round, and reactive to light. Right eye exhibits no discharge. Left eye exhibits no discharge.  Neck: Normal range of motion. Neck supple. No JVD present. No tracheal deviation present. No thyromegaly present.  Cardiovascular: Normal rate, regular rhythm and normal heart sounds.   Pulmonary/Chest: No stridor. No respiratory distress. She has no wheezes.  Abdominal: Soft. Bowel sounds are normal. She exhibits no distension and no mass. There is no tenderness. There is no rebound and no guarding.  Musculoskeletal: She  exhibits no edema and no tenderness.  Lymphadenopathy:    She has no cervical adenopathy.  Neurological: She displays normal reflexes. No cranial nerve deficit. She exhibits normal muscle tone. Coordination normal.  Skin: No rash noted. No erythema.  Psychiatric: She has a normal mood and affect. Her behavior is normal. Judgment and thought content normal.   Lab Results  Component Value Date   WBC 16.4* 08/07/2008   HGB 15.6* 08/07/2008   HCT 46.0 08/07/2008   PLT 334 08/07/2008   GLUCOSE 103* 02/03/2011   CHOL 207* 02/03/2011   TRIG 135.0 02/03/2011   HDL 40.60 02/03/2011   LDLDIRECT 131.2 02/03/2011   LDLCALC 126* 06/20/2007   ALT 34 02/03/2011   AST 32 02/03/2011   NA 138 02/03/2011   K 4.5 02/03/2011   CL 104 02/03/2011   CREATININE 0.7 02/03/2011   BUN 16 02/03/2011   CO2 27 02/03/2011   TSH 1.12 01/16/2007   INR 1.0 04/19/2007   HGBA1C 6.4 02/03/2011           Assessment & Plan:

## 2012-04-11 ENCOUNTER — Telehealth: Payer: Self-pay | Admitting: *Deleted

## 2012-04-11 NOTE — Telephone Encounter (Signed)
Ok w/me Thx 

## 2012-04-11 NOTE — Telephone Encounter (Signed)
Dr. Posey Rea- ok?

## 2012-04-11 NOTE — Telephone Encounter (Signed)
Message copied by Merrilyn Puma on Tue Apr 11, 2012  3:02 PM ------      Message from: Etheleen Sia      Created: Mon Apr 10, 2012  3:36 PM       Gianelle Vandunk WANTS YOU TO CALL HER.  SHE WANTS TO SET UP HER GRAND DAUGHTER AS A NEW PT.  THINKS SHE HAS [employer] STATE.  SAID SHE TALKED TO SOMEONE BEFORE BUT NO ONE HAS CALLED HER BACK.        HER NUMBER IS [id number].  INFORMED HER THE APPT WOULD BE IN LATE NOV OR DEC

## 2012-04-11 NOTE — Telephone Encounter (Signed)
Message copied by Merrilyn Puma on Tue Apr 11, 2012  3:02 PM ------      Message from: Etheleen Sia      Created: Mon Apr 10, 2012  3:38 PM       THE GRAND DAUGHTER'S NAME IS Ihor Gully 09[birth year]04-93

## 2012-04-13 NOTE — Telephone Encounter (Signed)
LMOM TO CALL  

## 2012-04-19 ENCOUNTER — Encounter: Payer: Self-pay | Admitting: Gastroenterology

## 2012-04-19 NOTE — Telephone Encounter (Signed)
Monica Horn- Have we contacted pt re: below?

## 2012-04-19 NOTE — Telephone Encounter (Signed)
LM W/HUSBAND to call for an appt.

## 2012-06-13 ENCOUNTER — Ambulatory Visit (INDEPENDENT_AMBULATORY_CARE_PROVIDER_SITE_OTHER): Payer: Medicare Other | Admitting: Internal Medicine

## 2012-06-13 ENCOUNTER — Encounter: Payer: Self-pay | Admitting: Internal Medicine

## 2012-06-13 VITALS — BP 110/78 | HR 80 | Temp 98.2°F | Resp 16 | Wt 160.0 lb

## 2012-06-13 DIAGNOSIS — M79673 Pain in unspecified foot: Secondary | ICD-10-CM

## 2012-06-13 DIAGNOSIS — E119 Type 2 diabetes mellitus without complications: Secondary | ICD-10-CM

## 2012-06-13 DIAGNOSIS — M79609 Pain in unspecified limb: Secondary | ICD-10-CM

## 2012-06-13 DIAGNOSIS — Z23 Encounter for immunization: Secondary | ICD-10-CM

## 2012-06-13 DIAGNOSIS — J069 Acute upper respiratory infection, unspecified: Secondary | ICD-10-CM

## 2012-06-13 DIAGNOSIS — I10 Essential (primary) hypertension: Secondary | ICD-10-CM

## 2012-06-13 DIAGNOSIS — E7800 Pure hypercholesterolemia, unspecified: Secondary | ICD-10-CM

## 2012-06-13 DIAGNOSIS — E785 Hyperlipidemia, unspecified: Secondary | ICD-10-CM

## 2012-06-13 HISTORY — DX: Pain in unspecified foot: M79.673

## 2012-06-13 MED ORDER — PROMETHAZINE-CODEINE 6.25-10 MG/5ML PO SYRP: 5.0000 mL | ORAL_SOLUTION | ORAL | Status: AC | PRN

## 2012-06-13 MED ORDER — IBUPROFEN 400 MG PO TABS: 400.0000 mg | ORAL_TABLET | Freq: Three times a day (TID) | ORAL | Status: DC | PRN

## 2012-06-13 MED ORDER — AZITHROMYCIN 250 MG PO TABS: ORAL_TABLET | ORAL | Status: DC

## 2012-06-13 NOTE — Progress Notes (Signed)
   Subjective:    Patient ID: Monica Horn, female    DOB: 03-09-1946, 66 y.o.   MRN: [id number]  HPI  F/u HTN and dyslipidemia C/o blood in stool off and on last month f/u R shoulder pain - better C/o aches  Wt Readings from Last 3 Encounters:  06/13/12 160 lb (72.576 kg)  02/11/12 150 lb (68.04 kg)  01/05/12 157 lb (71.215 kg)    BP Readings from Last 3 Encounters:  06/13/12 110/78  02/11/12 118/76  01/05/12 152/94     Review of Systems  Constitutional: Negative for chills, activity change, appetite change, fatigue and unexpected weight change.  HENT: Positive for congestion and sore throat. Negative for mouth sores and sinus pressure.   Eyes: Negative for visual disturbance.  Respiratory: Positive for cough. Negative for chest tightness.   Genitourinary: Negative for frequency, difficulty urinating and vaginal pain.  Musculoskeletal: Negative for back pain and gait problem.  Skin: Negative for pallor.  Neurological: Positive for numbness (R hand chronic). Negative for dizziness, tremors and weakness.  Psychiatric/Behavioral: Negative for confusion and disturbed wake/sleep cycle.       Objective:   Physical Exam  Constitutional: She appears well-developed and well-nourished. No distress.  HENT:  Head: Normocephalic.  Right Ear: External ear normal.  Left Ear: External ear normal.  Nose: Nose normal.  Mouth/Throat: Oropharynx is clear and moist.       eryth throat  Eyes: Conjunctivae normal are normal. Pupils are equal, round, and reactive to light. Right eye exhibits no discharge. Left eye exhibits no discharge.  Neck: Normal range of motion. Neck supple. No JVD present. No tracheal deviation present. No thyromegaly present.  Cardiovascular: Normal rate, regular rhythm and normal heart sounds.   Pulmonary/Chest: No stridor. No respiratory distress. She has no wheezes.  Abdominal: Soft. Bowel sounds are normal. She exhibits no distension and no mass. There is no  tenderness. There is no rebound and no guarding.  Musculoskeletal: She exhibits no edema and no tenderness.  Lymphadenopathy:    She has no cervical adenopathy.  Neurological: She displays normal reflexes. No cranial nerve deficit. She exhibits normal muscle tone. Coordination normal.  Skin: No rash noted. No erythema.  Psychiatric: She has a normal mood and affect. Her behavior is normal. Judgment and thought content normal.  L dorsal mid-foot is with a tender swelling 2x3 cm Lab Results  Component Value Date   WBC 16.4* 08/07/2008   HGB 15.6* 08/07/2008   HCT 46.0 08/07/2008   PLT 334 08/07/2008   GLUCOSE 103* 02/03/2011   CHOL 207* 02/03/2011   TRIG 135.0 02/03/2011   HDL 40.60 02/03/2011   LDLDIRECT 131.2 02/03/2011   LDLCALC 126* 06/20/2007   ALT 34 02/03/2011   AST 32 02/03/2011   NA 138 02/03/2011   K 4.5 02/03/2011   CL 104 02/03/2011   CREATININE 0.7 02/03/2011   BUN 16 02/03/2011   CO2 27 02/03/2011   TSH 1.12 01/16/2007   INR 1.0 04/19/2007   HGBA1C 6.4 02/03/2011           Assessment & Plan:

## 2012-06-13 NOTE — Assessment & Plan Note (Signed)
Zpac if worse Delsym Prom-cod

## 2012-06-13 NOTE — Assessment & Plan Note (Signed)
Continue with current prescription therapy as reflected on the Med list.  

## 2012-06-13 NOTE — Assessment & Plan Note (Signed)
Xray if needed Comfortable shoes Ibuprofen Rx prn

## 2012-06-13 NOTE — Assessment & Plan Note (Signed)
Diet controlled.  

## 2012-06-20 ENCOUNTER — Telehealth: Payer: Self-pay

## 2012-06-20 MED ORDER — AMOXICILLIN 500 MG PO CAPS: 1000.0000 mg | ORAL_CAPSULE | Freq: Two times a day (BID) | ORAL | Status: DC

## 2012-06-20 NOTE — Telephone Encounter (Signed)
Ok Amoxicillin OTC Robutussin for cough Thx

## 2012-06-20 NOTE — Telephone Encounter (Signed)
Pt called stating the Zpak and codeine cough syrup prescribed at last office visit is too expensive. Pt is requesting cheaper alternative, please advise.

## 2012-06-22 NOTE — Telephone Encounter (Signed)
Pt advised via VM of TRx and pharmacy

## 2012-07-03 ENCOUNTER — Other Ambulatory Visit: Payer: Self-pay | Admitting: *Deleted

## 2012-07-03 MED ORDER — LOVASTATIN 20 MG PO TABS: 20.0000 mg | ORAL_TABLET | Freq: Every day | ORAL | Status: DC

## 2012-08-16 DIAGNOSIS — R9431 Abnormal electrocardiogram [ECG] [EKG]: Secondary | ICD-10-CM

## 2012-08-16 HISTORY — DX: Abnormal electrocardiogram (ECG) (EKG): R94.31

## 2012-08-25 ENCOUNTER — Encounter: Payer: Self-pay | Admitting: Internal Medicine

## 2012-08-25 ENCOUNTER — Ambulatory Visit (INDEPENDENT_AMBULATORY_CARE_PROVIDER_SITE_OTHER): Payer: Medicare Other | Admitting: Internal Medicine

## 2012-08-25 VITALS — BP 122/84 | HR 80 | Temp 97.2°F | Resp 16 | Wt 163.0 lb

## 2012-08-25 DIAGNOSIS — E119 Type 2 diabetes mellitus without complications: Secondary | ICD-10-CM

## 2012-08-25 DIAGNOSIS — R0789 Other chest pain: Secondary | ICD-10-CM

## 2012-08-25 DIAGNOSIS — R9431 Abnormal electrocardiogram [ECG] [EKG]: Secondary | ICD-10-CM

## 2012-08-25 DIAGNOSIS — R000 Tachycardia, unspecified: Secondary | ICD-10-CM

## 2012-08-25 DIAGNOSIS — K219 Gastro-esophageal reflux disease without esophagitis: Secondary | ICD-10-CM

## 2012-08-25 NOTE — Patient Instructions (Signed)
Stop Ibuprofen, Lovastatin

## 2012-08-25 NOTE — Progress Notes (Signed)
   Subjective:     Heartburn She complains of belching, chest pain, coughing, heartburn, nausea and a sore throat. This is a new problem. The current episode started more than 1 month ago. The problem occurs occasionally. The problem has been waxing and waning. Pertinent negatives include no fatigue. Risk factors include obesity. She has tried a PPI for the symptoms. The treatment provided no relief.    F/u HTN and dyslipidemia C/o blood in stool off and on last month f/u R shoulder pain - better C/o aches  Wt Readings from Last 3 Encounters:  08/25/12 163 lb (73.936 kg)  06/13/12 160 lb (72.576 kg)  02/11/12 150 lb (68.04 kg)    BP Readings from Last 3 Encounters:  08/25/12 122/84  06/13/12 110/78  02/11/12 118/76     Review of Systems  Constitutional: Negative for chills, activity change, appetite change, fatigue and unexpected weight change.  HENT: Positive for congestion and sore throat. Negative for mouth sores and sinus pressure.   Eyes: Negative for visual disturbance.  Respiratory: Positive for cough and shortness of breath. Negative for chest tightness.   Cardiovascular: Positive for chest pain.  Gastrointestinal: Positive for heartburn and nausea.  Genitourinary: Negative for frequency, difficulty urinating and vaginal pain.  Musculoskeletal: Negative for back pain and gait problem.  Skin: Negative for pallor.  Neurological: Positive for numbness (R hand chronic). Negative for dizziness, tremors and weakness.  Psychiatric/Behavioral: Negative for confusion and sleep disturbance.       Objective:   Physical Exam  Constitutional: She appears well-developed and well-nourished. No distress.  HENT:  Head: Normocephalic.  Right Ear: External ear normal.  Left Ear: External ear normal.  Nose: Nose normal.  Mouth/Throat: Oropharynx is clear and moist.       eryth throat  Eyes: Conjunctivae normal are normal. Pupils are equal, round, and reactive to light. Right  eye exhibits no discharge. Left eye exhibits no discharge.  Neck: Normal range of motion. Neck supple. No JVD present. No tracheal deviation present. No thyromegaly present.  Cardiovascular: Normal rate, regular rhythm and normal heart sounds.   Pulmonary/Chest: No stridor. No respiratory distress. She has no wheezes.  Abdominal: Soft. Bowel sounds are normal. She exhibits no distension and no mass. There is no tenderness. There is no rebound and no guarding.  Musculoskeletal: She exhibits no edema and no tenderness.  Lymphadenopathy:    She has no cervical adenopathy.  Neurological: She displays normal reflexes. No cranial nerve deficit. She exhibits normal muscle tone. Coordination normal.  Skin: No rash noted. No erythema.  Psychiatric: She has a normal mood and affect. Her behavior is normal. Judgment and thought content normal.  L dorsal mid-foot is with a tender swelling 2x3 cm Lab Results  Component Value Date   WBC 16.4* 08/07/2008   HGB 15.6* 08/07/2008   HCT 46.0 08/07/2008   PLT 334 08/07/2008   GLUCOSE 103* 02/03/2011   CHOL 207* 02/03/2011   TRIG 135.0 02/03/2011   HDL 40.60 02/03/2011   LDLDIRECT 131.2 02/03/2011   LDLCALC 126* 06/20/2007   ALT 34 02/03/2011   AST 32 02/03/2011   NA 138 02/03/2011   K 4.5 02/03/2011   CL 104 02/03/2011   CREATININE 0.7 02/03/2011   BUN 16 02/03/2011   CO2 27 02/03/2011   TSH 1.12 01/16/2007   INR 1.0 04/19/2007   HGBA1C 6.4 02/03/2011           Assessment & Plan:

## 2012-08-26 ENCOUNTER — Encounter: Payer: Self-pay | Admitting: Internal Medicine

## 2012-08-26 DIAGNOSIS — R0789 Other chest pain: Secondary | ICD-10-CM | POA: Insufficient documentation

## 2012-08-26 DIAGNOSIS — K219 Gastro-esophageal reflux disease without esophagitis: Secondary | ICD-10-CM | POA: Insufficient documentation

## 2012-08-26 DIAGNOSIS — R9431 Abnormal electrocardiogram [ECG] [EKG]: Secondary | ICD-10-CM | POA: Insufficient documentation

## 2012-08-26 HISTORY — DX: Other chest pain: R07.89

## 2012-08-26 NOTE — Assessment & Plan Note (Signed)
  On diet  

## 2012-08-26 NOTE — Assessment & Plan Note (Signed)
Will get an ECHO 

## 2012-08-30 ENCOUNTER — Ambulatory Visit
Admission: RE | Admit: 2012-08-30 | Discharge: 2012-08-30 | Disposition: A | Discharge status: Home / Self Care | Payer: Medicare Other | Source: Ambulatory Visit | Attending: Internal Medicine | Admitting: Internal Medicine

## 2012-08-30 DIAGNOSIS — K219 Gastro-esophageal reflux disease without esophagitis: Secondary | ICD-10-CM

## 2012-08-30 DIAGNOSIS — R0789 Other chest pain: Secondary | ICD-10-CM

## 2012-08-31 ENCOUNTER — Telehealth: Payer: Self-pay | Admitting: Internal Medicine

## 2012-08-31 NOTE — Telephone Encounter (Signed)
Left mess for patient to call back.  

## 2012-08-31 NOTE — Telephone Encounter (Signed)
Monica Horn, please, inform patient that she has no gallstones; has a fatty liver - cont w/wt loss Thx

## 2012-09-01 NOTE — Telephone Encounter (Signed)
Left detailed mess informing pt of below.  

## 2012-09-05 ENCOUNTER — Other Ambulatory Visit (HOSPITAL_COMMUNITY): Payer: Medicare Other

## 2012-09-05 ENCOUNTER — Telehealth: Payer: Self-pay | Admitting: Internal Medicine

## 2012-09-05 NOTE — Telephone Encounter (Signed)
Left detailed mess informing pt all she can do re: fatty liver is cut out fat in her diet.

## 2012-09-05 NOTE — Telephone Encounter (Signed)
Patient would like to speak with Kennyth Arnold to get more information about fatty liver and what she needs to do about this condition

## 2012-09-12 ENCOUNTER — Other Ambulatory Visit: Payer: Self-pay

## 2012-09-12 ENCOUNTER — Telehealth: Payer: Self-pay | Admitting: Internal Medicine

## 2012-09-12 ENCOUNTER — Ambulatory Visit (HOSPITAL_COMMUNITY): Payer: Medicare Other | Attending: Internal Medicine | Admitting: Radiology

## 2012-09-12 DIAGNOSIS — R0789 Other chest pain: Secondary | ICD-10-CM

## 2012-09-12 DIAGNOSIS — E119 Type 2 diabetes mellitus without complications: Secondary | ICD-10-CM | POA: Insufficient documentation

## 2012-09-12 DIAGNOSIS — E669 Obesity, unspecified: Secondary | ICD-10-CM | POA: Insufficient documentation

## 2012-09-12 DIAGNOSIS — R9431 Abnormal electrocardiogram [ECG] [EKG]: Secondary | ICD-10-CM

## 2012-09-12 DIAGNOSIS — R072 Precordial pain: Secondary | ICD-10-CM

## 2012-09-12 DIAGNOSIS — R000 Tachycardia, unspecified: Secondary | ICD-10-CM | POA: Insufficient documentation

## 2012-09-12 NOTE — Progress Notes (Signed)
Echocardiogram performed.  

## 2012-09-12 NOTE — Telephone Encounter (Signed)
Monica Horn, please, inform patient that card ECHO was overall ok Thx

## 2012-09-13 NOTE — Telephone Encounter (Signed)
Pt informed

## 2012-10-13 ENCOUNTER — Ambulatory Visit (INDEPENDENT_AMBULATORY_CARE_PROVIDER_SITE_OTHER): Payer: Medicare Other | Admitting: Internal Medicine

## 2012-10-13 ENCOUNTER — Encounter: Payer: Self-pay | Admitting: Internal Medicine

## 2012-10-13 VITALS — BP 138/90 | HR 76 | Temp 96.9°F | Resp 16 | Wt 163.0 lb

## 2012-10-13 DIAGNOSIS — I10 Essential (primary) hypertension: Secondary | ICD-10-CM

## 2012-10-13 DIAGNOSIS — R55 Syncope and collapse: Secondary | ICD-10-CM

## 2012-10-13 DIAGNOSIS — E119 Type 2 diabetes mellitus without complications: Secondary | ICD-10-CM

## 2012-10-13 HISTORY — DX: Syncope and collapse: R55

## 2012-10-13 MED ORDER — AMLODIPINE BESY-BENAZEPRIL HCL 5-10 MG PO CAPS: 1.0000 | ORAL_CAPSULE | Freq: Every day | ORAL | Status: DC

## 2012-10-13 NOTE — Assessment & Plan Note (Signed)
D/c enalapril Start Lotrel

## 2012-10-13 NOTE — Assessment & Plan Note (Signed)
Continue with current prescription therapy as reflected on the Med list.  

## 2012-10-13 NOTE — Assessment & Plan Note (Signed)
Card cons

## 2012-10-13 NOTE — Progress Notes (Signed)
   Subjective:     Heartburn She complains of belching, chest pain, coughing, heartburn, nausea and a sore throat. This is a recurrent problem. The current episode started more than 1 month ago. The problem occurs occasionally (random). The problem has been waxing and waning. Pertinent negatives include no fatigue. Risk factors include obesity. She has tried a PPI and an antacid for the symptoms. The treatment provided no relief.    F/u HTN and dyslipidemia C/o blood in stool off and on last month f/u R shoulder pain - better C/o aches  Wt Readings from Last 3 Encounters:  10/13/12 163 lb (73.936 kg)  08/25/12 163 lb (73.936 kg)  06/13/12 160 lb (72.576 kg)    BP Readings from Last 3 Encounters:  10/13/12 138/90  08/25/12 122/84  06/13/12 110/78     Review of Systems  Constitutional: Negative for chills, activity change, appetite change, fatigue and unexpected weight change.  HENT: Positive for congestion and sore throat. Negative for mouth sores and sinus pressure.   Eyes: Negative for visual disturbance.  Respiratory: Positive for cough and shortness of breath. Negative for chest tightness.   Cardiovascular: Positive for chest pain.  Gastrointestinal: Positive for heartburn and nausea.  Genitourinary: Negative for frequency, difficulty urinating and vaginal pain.  Musculoskeletal: Negative for back pain and gait problem.  Skin: Negative for pallor.  Neurological: Positive for light-headedness (w/spells) and numbness (R hand chronic). Negative for dizziness, tremors and weakness.  Psychiatric/Behavioral: Negative for confusion and sleep disturbance.       Objective:   Physical Exam  Constitutional: She appears well-developed and well-nourished. No distress.  HENT:  Head: Normocephalic.  Right Ear: External ear normal.  Left Ear: External ear normal.  Nose: Nose normal.  Mouth/Throat: Oropharynx is clear and moist.  eryth throat  Eyes: Conjunctivae are normal.  Pupils are equal, round, and reactive to light. Right eye exhibits no discharge. Left eye exhibits no discharge.  Neck: Normal range of motion. Neck supple. No JVD present. No tracheal deviation present. No thyromegaly present.  Cardiovascular: Normal rate, regular rhythm and normal heart sounds.   Pulmonary/Chest: No stridor. No respiratory distress. She has no wheezes.  Abdominal: Soft. Bowel sounds are normal. She exhibits no distension and no mass. There is no tenderness. There is no rebound and no guarding.  Musculoskeletal: She exhibits no edema and no tenderness.  Lymphadenopathy:    She has no cervical adenopathy.  Neurological: She displays normal reflexes. No cranial nerve deficit. She exhibits normal muscle tone. Coordination normal.  Skin: No rash noted. No erythema.  Psychiatric: She has a normal mood and affect. Her behavior is normal. Judgment and thought content normal.  L dorsal mid-foot is with a tender swelling 2x3 cm Lab Results  Component Value Date   WBC 16.4* 08/07/2008   HGB 15.6* 08/07/2008   HCT 46.0 08/07/2008   PLT 334 08/07/2008   GLUCOSE 103* 02/03/2011   CHOL 207* 02/03/2011   TRIG 135.0 02/03/2011   HDL 40.60 02/03/2011   LDLDIRECT 131.2 02/03/2011   LDLCALC 126* 06/20/2007   ALT 34 02/03/2011   AST 32 02/03/2011   NA 138 02/03/2011   K 4.5 02/03/2011   CL 104 02/03/2011   CREATININE 0.7 02/03/2011   BUN 16 02/03/2011   CO2 27 02/03/2011   TSH 1.12 01/16/2007   INR 1.0 04/19/2007   HGBA1C 6.4 02/03/2011           Assessment & Plan:

## 2012-10-13 NOTE — Patient Instructions (Signed)
Stop Enalapril and start Lotrel

## 2012-10-13 NOTE — Assessment & Plan Note (Signed)
Card cons D/c enalapril Start Lotrel

## 2012-10-15 NOTE — Assessment & Plan Note (Addendum)
Card cons  ECHO Study Conclusions:  Left ventricle: The cavity size was normal. Umanzor thickness was increased in a pattern of mild LVH. There was concentric hypertrophy. Systolic function was vigorous. The estimated ejection fraction was in the range of 65% to 70%. There was dynamic obstruction during Valsalvain the mid cavity, with mid-cavity obliteration, a peak velocity of 271cm/sec, and a peak gradient of 29mm Hg. Fedie motion was normal; there were no regional Marcantel motion abnormalities. There was an increased relative contribution of atrial contraction to ventricular filling. Doppler parameters are consistent with abnormal left ventricular relaxation (grade 1 diastolic dysfunction).

## 2012-10-15 NOTE — Assessment & Plan Note (Signed)
Continue with current prescription therapy as reflected on the Med list.  

## 2012-11-01 ENCOUNTER — Telehealth: Payer: Self-pay | Admitting: Internal Medicine

## 2012-11-01 NOTE — Telephone Encounter (Signed)
Caller: Angelina/Patient; Phone: [phone]; Reason for Call: Patient states she needs to get message to nurse Syacy to call in her medications to [facility].  Pt states pharmacy advised her Rx refills had not been received.  PLEASE F/U WITH PT WITH QUESTIONS, THANK YOU.

## 2012-11-03 ENCOUNTER — Other Ambulatory Visit: Payer: Self-pay | Admitting: *Deleted

## 2012-11-03 MED ORDER — PANTOPRAZOLE SODIUM 40 MG PO TBEC: 40.0000 mg | DELAYED_RELEASE_TABLET | Freq: Every day | ORAL | Status: DC

## 2012-11-03 MED ORDER — ENALAPRIL MALEATE 10 MG PO TABS: 10.0000 mg | ORAL_TABLET | Freq: Every day | ORAL | Status: DC

## 2012-11-09 MED ORDER — CETIRIZINE HCL 10 MG PO TABS: 10.0000 mg | ORAL_TABLET | Freq: Every day | ORAL | Status: DC | PRN

## 2012-11-09 MED ORDER — AMLODIPINE BESY-BENAZEPRIL HCL 5-10 MG PO CAPS: 1.0000 | ORAL_CAPSULE | Freq: Every day | ORAL | Status: DC

## 2012-11-09 MED ORDER — LOVASTATIN 20 MG PO TABS: 20.0000 mg | ORAL_TABLET | Freq: Every day | ORAL | Status: DC

## 2012-11-09 NOTE — Telephone Encounter (Signed)
Refills sent

## 2012-11-14 ENCOUNTER — Encounter: Payer: Self-pay | Admitting: Internal Medicine

## 2012-11-14 ENCOUNTER — Ambulatory Visit (INDEPENDENT_AMBULATORY_CARE_PROVIDER_SITE_OTHER): Payer: Medicare Other | Admitting: Internal Medicine

## 2012-11-14 VITALS — BP 125/80 | HR 105 | Ht 61.0 in | Wt 160.8 lb

## 2012-11-14 DIAGNOSIS — I10 Essential (primary) hypertension: Secondary | ICD-10-CM

## 2012-11-14 DIAGNOSIS — R0789 Other chest pain: Secondary | ICD-10-CM

## 2012-11-14 NOTE — Assessment & Plan Note (Signed)
The patient has both typical as well as atypical chest pain. She has multiple cardiac risk factors. She has a grossly abnormal electrocardiogram with baseline ST-T wave changes and ST segment depression. She is LVH with strain as well. I recommended that she undergo exercise perfusion imaging. If her test is negative, then we'll recommend watchful waiting. If her stress test is positive, catheterization would be next. No change in medical therapy at this point.

## 2012-11-14 NOTE — Patient Instructions (Addendum)
Your physician wants you to follow-up in: 6 months with Dr Taylor You will receive a reminder letter in the mail two months in advance. If you don't receive a letter, please call our office to schedule the follow-up appointment.  Your physician has requested that you have en exercise stress myoview. For further information please visit [email or web address]. Please follow instruction sheet, as given.    

## 2012-11-14 NOTE — Assessment & Plan Note (Signed)
Her blood pressure medications have been adjusted recently and her blood pressure is well controlled today. I've encouraged low-sodium diet.

## 2012-11-14 NOTE — Progress Notes (Signed)
HPI This is is with her today for relation of chest pressure and shortness of breath associated with dizziness. She is a very pleasant 67 year old woman with hypertension and dyslipidemia. The patient exercises but has noted over the last several weeks that she will have chest tightness and shortness of breath with exertion. It is not always reproducible but more often than not associated with exertion. Occasionally she'll have episodes at rest. No syncope but she does have dizziness and diaphoresis. The patient denies a strong family history of coronary disease. No tobacco abuse. She does have a history of gastrointestinal problems. No Known Allergies   Current Outpatient Prescriptions  Medication Sig Dispense Refill  . amLODipine-benazepril (LOTREL) 5-10 MG per capsule Take 1 capsule by mouth daily.  90 capsule  3  . cetirizine (ZYRTEC) 10 MG tablet Take 1 tablet (10 mg total) by mouth daily as needed.  90 tablet  3  . Cholecalciferol (VITAMIN D3) 1000 UNITS tablet Take 1,000 Units by mouth daily.        Marland Kitchen lovastatin (MEVACOR) 20 MG tablet Take 1 tablet (20 mg total) by mouth at bedtime.  90 tablet  3  . pantoprazole (PROTONIX) 40 MG tablet Take 1 tablet (40 mg total) by mouth daily.  90 tablet  3  . triamcinolone cream (KENALOG) 0.5 % Apply topically as needed.       No current facility-administered medications for this visit.     Past Medical History  Diagnosis Date  . GERD (gastroesophageal reflux disease)   . Fatty infiltration of liver   . Tubulovillous adenoma polyp of colon 03/2007    w/ focal high grade dysplasia, no carcinoma  . Hyperlipidemia   . Hypertension   . Hydradenitis 01/2009    Right S aureus  . Internal hemorrhoids   . Arthritis   . Blood transfusion   . Diabetes mellitus     PT. DENIES    ROS:   All systems reviewed and negative except as noted in the HPI.   Past Surgical History  Procedure Laterality Date  . Polypectomy    . Tubal ligation    . Carpal  tunnel release      Lt  wrist  . Colonoscopy       Family History  Problem Relation Age of Onset  . Hypertension Other   . Stroke Other   . Stroke Mother   . Hypertension Mother   . Cancer Father     ? liver ca  . Alcohol abuse Father      History   Social History  . Marital Status: Married    Spouse Name: N/A    Number of Children: N/A  . Years of Education: N/A   Occupational History  . Not on file.   Social History Main Topics  . Smoking status: Former Games developer  . Smokeless tobacco: Never Used  . Alcohol Use: No  . Drug Use: No  . Sexually Active: Yes   Other Topics Concern  . Not on file   Social History Narrative  . No narrative on file     BP 125/80  Pulse 105  Ht 5\' 1"  (1.549 m)  Wt 160 lb 12.8 oz (72.938 kg)  BMI 30.4 kg/m2  Physical Exam:  Well appearing NAD HEENT: Unremarkable Neck:  No JVD, no thyromegally Lymphatics:  No adenopathy Back:  No CVA tenderness Lungs:  Clear HEART:  Regular rate rhythm, no murmurs, no rubs, no clicks Abd:  soft, positive bowel sounds,  no organomegally, no rebound, no guarding Ext:  2 plus pulses, no edema, no cyanosis, no clubbing Skin:  No rashes no nodules Neuro:  CN II through XII intact, motor grossly intact  EKG  normal sinus rhythm with marked ST-T wave abnormality consistent with LVH with strain versus ischemia  Assess/Plan:

## 2012-11-22 ENCOUNTER — Other Ambulatory Visit: Payer: Self-pay | Admitting: Obstetrics & Gynecology

## 2012-11-23 ENCOUNTER — Ambulatory Visit (HOSPITAL_COMMUNITY): Payer: Medicare Other | Attending: Internal Medicine | Admitting: Radiology

## 2012-11-23 VITALS — BP 116/71 | HR 86 | Ht 61.0 in | Wt 160.0 lb

## 2012-11-23 DIAGNOSIS — R002 Palpitations: Secondary | ICD-10-CM | POA: Insufficient documentation

## 2012-11-23 DIAGNOSIS — R110 Nausea: Secondary | ICD-10-CM | POA: Insufficient documentation

## 2012-11-23 DIAGNOSIS — R079 Chest pain, unspecified: Secondary | ICD-10-CM | POA: Insufficient documentation

## 2012-11-23 DIAGNOSIS — Z87891 Personal history of nicotine dependence: Secondary | ICD-10-CM | POA: Insufficient documentation

## 2012-11-23 DIAGNOSIS — I10 Essential (primary) hypertension: Secondary | ICD-10-CM

## 2012-11-23 DIAGNOSIS — R0789 Other chest pain: Secondary | ICD-10-CM

## 2012-11-23 DIAGNOSIS — R5381 Other malaise: Secondary | ICD-10-CM | POA: Insufficient documentation

## 2012-11-23 DIAGNOSIS — R0602 Shortness of breath: Secondary | ICD-10-CM | POA: Insufficient documentation

## 2012-11-23 DIAGNOSIS — R42 Dizziness and giddiness: Secondary | ICD-10-CM | POA: Insufficient documentation

## 2012-11-23 MED ORDER — TECHNETIUM TC 99M SESTAMIBI GENERIC - CARDIOLITE
10.0000 | Freq: Once | INTRAVENOUS | Status: AC | PRN
  Administered 2012-11-23: 10 via INTRAVENOUS

## 2012-11-23 MED ORDER — TECHNETIUM TC 99M SESTAMIBI GENERIC - CARDIOLITE
30.0000 | Freq: Once | INTRAVENOUS | Status: AC | PRN
  Administered 2012-11-23: 30 via INTRAVENOUS

## 2012-11-23 NOTE — Progress Notes (Signed)
[facility] SITE 3 NUCLEAR MED [address] [place], [place] [address] [phone]    Cardiology Nuclear Med Study  Monica Horn is a 67 y.o. female     MRN : [id number]     DOB: 01-22-1946  Procedure Date: 11/23/2012  Nuclear Med Background Indication for Stress Test:  Evaluation for Ischemia History:  '04 MPS:"probably normal", EF=74% Cardiac Risk Factors: History of Smoking, Hypertension and Lipids  Symptoms:  Chest Pain/Pressure/Tightness with and without Exertion (last episode of chest discomfort was about 2-weeks ago), Diaphoresis, Dizziness, Fatigue, Light-Headedness, Nausea, Palpitations and SOB   Nuclear Pre-Procedure Caffeine/Decaff Intake:  None NPO After: 8:30pm   Lungs:  Clear. O2 Sat: 94% on room air. IV 0.9% NS with Angio Cath:  22g  IV Site: R Antecubital  IV Started by:  Stanton Kidney, EMT-P  Chest Size (in):  38 Cup Size: B  Height: 5\' 1"  (1.549 m)  Weight:  160 lb (72.576 kg)  BMI:  Body mass index is 30.25 kg/(m^2). Tech Comments:  med's were taken as directed.    Nuclear Med Study 1 or 2 day study: 1 day  Stress Test Type:  Stress  Reading MD: Dietrich Pates, MD  Order Authorizing Provider:  Lewayne Bunting, MD  Resting Radionuclide: Technetium 41m Sestamibi  Resting Radionuclide Dose: 11.0 mCi   Stress Radionuclide:  Technetium 23m Sestamibi  Stress Radionuclide Dose: 33.0 mCi           Stress Protocol Rest HR: 86 Stress HR: 137  Rest BP: 116/76 Stress BP: 186/96  Exercise Time (min): 3:46 METS: 5.5   Predicted Max HR: 154 bpm % Max HR: 88.96 bpm Rate Pressure Product: 21308   Dose of Adenosine (mg):  n/a Dose of Lexiscan: n/a mg  Dose of Atropine (mg): n/a Dose of Dobutamine: n/a mcg/kg/min (at max HR)  Stress Test Technologist: Smiley Houseman, CMA-N  Nuclear Technologist:  Domenic Polite, CNMT     Rest Procedure:  Myocardial perfusion imaging was performed at rest 45 minutes following the intravenous administration of Technetium 87m  Sestamibi.  Rest ECG:LVH with repolarization abnormality  Stress Procedure:  The patient exercised on the treadmill utilizing the Bruce Protocol for 3:46 minutes. The patient stopped due to fatigue and denied any chest pain.  Technetium 45m Sestamibi was injected at peak exercise and myocardial perfusion imaging was performed after a brief delay.  Stress ECG: Nondiagnostic due to baseline changes  QPS Raw Data Images:   Ijmages were motion corrected.  SOft tissue (diaphragm, breast) surround heart. Stress Images:  Normal homogeneous uptake in all areas of the myocardium. Rest Images:  Normal homogeneous uptake in all areas of the myocardium. Subtraction (SDS):  No evidence of ischemia. Transient Ischemic Dilatation (Normal <1.22):  1.14 Lung/Heart Ratio (Normal <0.45):  0.26  Quantitative Gated Spect Images QGS EDV:  37 ml QGS ESV:  07 ml  Impression Exercise Capacity:  Poor exercise capacity. BP Response:  Normal blood pressure response. Clinical Symptoms:  No chest pain. ECG Impression:  Nondiagnostic Comparison with Prior Nuclear Study: No significant change from previous scan.  Overall Impression:  Normal stress nuclear study.  LV Ejection Fraction: 80%.  LV [place] Motion:  NL LV Function; NL Hockman Motion  Dietrich Pates

## 2012-12-06 ENCOUNTER — Other Ambulatory Visit: Payer: Self-pay | Admitting: Obstetrics & Gynecology

## 2013-01-01 ENCOUNTER — Other Ambulatory Visit: Payer: Self-pay | Admitting: Internal Medicine

## 2013-01-01 DIAGNOSIS — Z1231 Encounter for screening mammogram for malignant neoplasm of breast: Secondary | ICD-10-CM

## 2013-01-12 ENCOUNTER — Encounter: Payer: Self-pay | Admitting: Internal Medicine

## 2013-01-12 ENCOUNTER — Other Ambulatory Visit (INDEPENDENT_AMBULATORY_CARE_PROVIDER_SITE_OTHER): Payer: Medicare Other

## 2013-01-12 ENCOUNTER — Ambulatory Visit (INDEPENDENT_AMBULATORY_CARE_PROVIDER_SITE_OTHER): Payer: Medicare Other | Admitting: Internal Medicine

## 2013-01-12 VITALS — BP 130/80 | HR 80 | Temp 96.9°F | Resp 16 | Wt 152.0 lb

## 2013-01-12 DIAGNOSIS — E785 Hyperlipidemia, unspecified: Secondary | ICD-10-CM

## 2013-01-12 DIAGNOSIS — K219 Gastro-esophageal reflux disease without esophagitis: Secondary | ICD-10-CM

## 2013-01-12 DIAGNOSIS — E119 Type 2 diabetes mellitus without complications: Secondary | ICD-10-CM

## 2013-01-12 DIAGNOSIS — I10 Essential (primary) hypertension: Secondary | ICD-10-CM

## 2013-01-12 LAB — HEMOGLOBIN A1C: Hgb A1c MFr Bld: 6 % (ref 4.6–6.5)

## 2013-01-12 LAB — BASIC METABOLIC PANEL
BUN: 15 mg/dL (ref 6–23)
Chloride: 107 mEq/L (ref 96–112)
GFR: 78.51 mL/min (ref >60.00)
Potassium: 3.4 mEq/L — ABNORMAL LOW (ref 3.5–5.1)
Sodium: 139 mEq/L (ref 135–145)

## 2013-01-12 LAB — LIPID PANEL
Cholesterol: 174 mg/dL (ref 0–200)
HDL: 38.8 mg/dL — ABNORMAL LOW (ref >39.00)
Triglycerides: 165 mg/dL — ABNORMAL HIGH (ref 0.0–149.0)

## 2013-01-12 LAB — HEPATIC FUNCTION PANEL
ALT: 51 U/L — ABNORMAL HIGH (ref 0–35)
AST: 36 U/L (ref 0–37)
Total Bilirubin: 0.6 mg/dL (ref 0.3–1.2)
Total Protein: 7.4 g/dL (ref 6.0–8.3)

## 2013-01-12 LAB — TSH: TSH: 1.17 u[IU]/mL (ref 0.35–5.50)

## 2013-01-12 NOTE — Assessment & Plan Note (Signed)
Continue with current prescription therapy as reflected on the Med list.  

## 2013-01-12 NOTE — Progress Notes (Signed)
   Subjective:     Heartburn She complains of belching, chest pain, coughing, heartburn, nausea and a sore throat. This is a recurrent problem. The current episode started more than 1 month ago. The problem occurs occasionally (random). The problem has been waxing and waning. Pertinent negatives include no fatigue. Risk factors include obesity. She has tried a PPI and an antacid for the symptoms. The treatment provided no relief.    F/u HTN and dyslipidemia C/o blood in stool off and on last month f/u R shoulder pain - better C/o aches  Wt Readings from Last 3 Encounters:  01/12/13 152 lb (68.947 kg)  11/23/12 160 lb (72.576 kg)  11/14/12 160 lb 12.8 oz (72.938 kg)    BP Readings from Last 3 Encounters:  01/12/13 130/80  11/23/12 116/71  11/14/12 125/80     Review of Systems  Constitutional: Negative for chills, activity change, appetite change, fatigue and unexpected weight change.  HENT: Positive for congestion and sore throat. Negative for mouth sores and sinus pressure.   Eyes: Negative for visual disturbance.  Respiratory: Positive for cough and shortness of breath. Negative for chest tightness.   Cardiovascular: Positive for chest pain.  Gastrointestinal: Positive for heartburn and nausea.  Genitourinary: Negative for frequency, difficulty urinating and vaginal pain.  Musculoskeletal: Negative for back pain and gait problem.  Skin: Negative for pallor.  Neurological: Positive for light-headedness (w/spells) and numbness (R hand chronic). Negative for dizziness, tremors and weakness.  Psychiatric/Behavioral: Negative for confusion and sleep disturbance.       Objective:   Physical Exam  Constitutional: She appears well-developed and well-nourished. No distress.  HENT:  Head: Normocephalic.  Right Ear: External ear normal.  Left Ear: External ear normal.  Nose: Nose normal.  Mouth/Throat: Oropharynx is clear and moist.  eryth throat  Eyes: Conjunctivae are  normal. Pupils are equal, round, and reactive to light. Right eye exhibits no discharge. Left eye exhibits no discharge.  Neck: Normal range of motion. Neck supple. No JVD present. No tracheal deviation present. No thyromegaly present.  Cardiovascular: Normal rate, regular rhythm and normal heart sounds.   Pulmonary/Chest: No stridor. No respiratory distress. She has no wheezes.  Abdominal: Soft. Bowel sounds are normal. She exhibits no distension and no mass. There is no tenderness. There is no rebound and no guarding.  Musculoskeletal: She exhibits no edema and no tenderness.  Lymphadenopathy:    She has no cervical adenopathy.  Neurological: She displays normal reflexes. No cranial nerve deficit. She exhibits normal muscle tone. Coordination normal.  Skin: No rash noted. No erythema.  Psychiatric: She has a normal mood and affect. Her behavior is normal. Judgment and thought content normal.  L dorsal mid-foot is with a tender swelling 2x3 cm  Lab Results  Component Value Date   WBC 16.4* 08/07/2008   HGB 15.6* 08/07/2008   HCT 46.0 08/07/2008   PLT 334 08/07/2008   GLUCOSE 103* 02/03/2011   CHOL 207* 02/03/2011   TRIG 135.0 02/03/2011   HDL 40.60 02/03/2011   LDLDIRECT 131.2 02/03/2011   LDLCALC 126* 06/20/2007   ALT 34 02/03/2011   AST 32 02/03/2011   NA 138 02/03/2011   K 4.5 02/03/2011   CL 104 02/03/2011   CREATININE 0.7 02/03/2011   BUN 16 02/03/2011   CO2 27 02/03/2011   TSH 1.12 01/16/2007   INR 1.0 04/19/2007   HGBA1C 6.4 02/03/2011           Assessment & Plan:

## 2013-01-15 LAB — BASIC METABOLIC PANEL
Chloride: 108 mEq/L (ref 96–112)
GFR: 80.53 mL/min (ref >60.00)
Potassium: 3.6 mEq/L (ref 3.5–5.1)
Sodium: 141 mEq/L (ref 135–145)

## 2013-02-05 ENCOUNTER — Ambulatory Visit (HOSPITAL_COMMUNITY)
Admission: RE | Admit: 2013-02-05 | Discharge: 2013-02-05 | Disposition: A | Discharge status: Home / Self Care | Payer: Medicare Other | Source: Ambulatory Visit | Attending: Internal Medicine | Admitting: Internal Medicine

## 2013-02-05 DIAGNOSIS — Z1231 Encounter for screening mammogram for malignant neoplasm of breast: Secondary | ICD-10-CM

## 2013-04-13 ENCOUNTER — Ambulatory Visit: Payer: Medicare Other | Admitting: Internal Medicine

## 2013-05-10 ENCOUNTER — Encounter: Payer: Self-pay | Admitting: Internal Medicine

## 2013-05-10 ENCOUNTER — Ambulatory Visit (INDEPENDENT_AMBULATORY_CARE_PROVIDER_SITE_OTHER): Payer: Medicare Other | Admitting: Internal Medicine

## 2013-05-10 ENCOUNTER — Other Ambulatory Visit (INDEPENDENT_AMBULATORY_CARE_PROVIDER_SITE_OTHER): Payer: Medicare Other

## 2013-05-10 VITALS — BP 110/82 | HR 117 | Temp 97.1°F | Wt 160.8 lb

## 2013-05-10 DIAGNOSIS — K219 Gastro-esophageal reflux disease without esophagitis: Secondary | ICD-10-CM

## 2013-05-10 DIAGNOSIS — F4321 Adjustment disorder with depressed mood: Secondary | ICD-10-CM

## 2013-05-10 DIAGNOSIS — R55 Syncope and collapse: Secondary | ICD-10-CM

## 2013-05-10 DIAGNOSIS — E119 Type 2 diabetes mellitus without complications: Secondary | ICD-10-CM

## 2013-05-10 DIAGNOSIS — I10 Essential (primary) hypertension: Secondary | ICD-10-CM

## 2013-05-10 LAB — URINALYSIS, ROUTINE W REFLEX MICROSCOPIC
Hgb urine dipstick: NEGATIVE
Nitrite: NEGATIVE
Specific Gravity, Urine: 1.02 (ref 1.000–1.030)
Urine Glucose: NEGATIVE
Urobilinogen, UA: 0.2 (ref 0.0–1.0)
pH: 6.5 (ref 5.0–8.0)

## 2013-05-10 LAB — HEPATIC FUNCTION PANEL
ALT: 37 U/L — ABNORMAL HIGH (ref 0–35)
Albumin: 4.3 g/dL (ref 3.5–5.2)
Total Bilirubin: 0.7 mg/dL (ref 0.3–1.2)
Total Protein: 7.9 g/dL (ref 6.0–8.3)

## 2013-05-10 LAB — HEMOGLOBIN A1C: Hgb A1c MFr Bld: 6.2 % (ref 4.6–6.5)

## 2013-05-10 LAB — TSH: TSH: 1.42 u[IU]/mL (ref 0.35–5.50)

## 2013-05-10 LAB — LIPID PANEL
Cholesterol: 183 mg/dL (ref 0–200)
Triglycerides: 156 mg/dL — ABNORMAL HIGH (ref 0.0–149.0)

## 2013-05-10 NOTE — Assessment & Plan Note (Signed)
17-Jan-2013 - mom died, god son died this week

## 2013-05-10 NOTE — Patient Instructions (Signed)
Hold Lovastatin 

## 2013-05-10 NOTE — Assessment & Plan Note (Signed)
No relapse 

## 2013-05-10 NOTE — Assessment & Plan Note (Signed)
Continue with current prescription therapy as reflected on the Med list.  

## 2013-05-10 NOTE — Progress Notes (Signed)
   Subjective:     HPI F/u GERD F/u stress F/u HTN and dyslipidemia C/o blood in stool off and on last month f/u R shoulder pain - better C/o aches  Wt Readings from Last 3 Encounters:  05/10/13 160 lb 12.8 oz (72.938 kg)  01/12/13 152 lb (68.947 kg)  11/23/12 160 lb (72.576 kg)    BP Readings from Last 3 Encounters:  05/10/13 110/82  01/12/13 130/80  11/23/12 116/71     Review of Systems  Constitutional: Negative for chills, activity change, appetite change and unexpected weight change.  HENT: Positive for congestion. Negative for mouth sores and sinus pressure.   Eyes: Negative for visual disturbance.  Respiratory: Positive for shortness of breath. Negative for chest tightness.   Genitourinary: Negative for frequency, difficulty urinating and vaginal pain.  Musculoskeletal: Negative for back pain and gait problem.  Skin: Negative for pallor.  Neurological: Positive for light-headedness (w/spells) and numbness (R hand chronic). Negative for dizziness, tremors and weakness.  Psychiatric/Behavioral: Negative for confusion and sleep disturbance.       Objective:   Physical Exam  Constitutional: She appears well-developed and well-nourished. No distress.  HENT:  Head: Normocephalic.  Right Ear: External ear normal.  Left Ear: External ear normal.  Nose: Nose normal.  Mouth/Throat: Oropharynx is clear and moist.  eryth throat  Eyes: Conjunctivae are normal. Pupils are equal, round, and reactive to light. Right eye exhibits no discharge. Left eye exhibits no discharge.  Neck: Normal range of motion. Neck supple. No JVD present. No tracheal deviation present. No thyromegaly present.  Cardiovascular: Normal rate, regular rhythm and normal heart sounds.   Pulmonary/Chest: No stridor. No respiratory distress. She has no wheezes.  Abdominal: Soft. Bowel sounds are normal. She exhibits no distension and no mass. There is no tenderness. There is no rebound and no guarding.   Musculoskeletal: She exhibits no edema and no tenderness.  Lymphadenopathy:    She has no cervical adenopathy.  Neurological: She displays normal reflexes. No cranial nerve deficit. She exhibits normal muscle tone. Coordination normal.  Skin: No rash noted. No erythema.  Psychiatric: She has a normal mood and affect. Her behavior is normal. Judgment and thought content normal.  L dorsal mid-foot is with a tender swelling 2x3 cm  Lab Results  Component Value Date   WBC 16.4* 08/07/2008   HGB 15.6* 08/07/2008   HCT 46.0 08/07/2008   PLT 334 08/07/2008   GLUCOSE 136* 01/12/2013   GLUCOSE 132* 01/12/2013   CHOL 174 01/12/2013   TRIG 165.0* 01/12/2013   HDL 38.80* 01/12/2013   LDLDIRECT 131.2 02/03/2011   LDLCALC 102* 01/12/2013   ALT 51* 01/12/2013   AST 36 01/12/2013   NA 139 01/12/2013   NA 141 01/12/2013   K 3.4* 01/12/2013   K 3.6 01/12/2013   CL 107 01/12/2013   CL 108 01/12/2013   CREATININE 0.9 01/12/2013   CREATININE 0.9 01/12/2013   BUN 15 01/12/2013   BUN 15 01/12/2013   CO2 25 01/12/2013   CO2 18* 01/12/2013   TSH 1.17 01/12/2013   INR 1.0 04/19/2007   HGBA1C 6.0 01/12/2013           Assessment & Plan:

## 2013-05-10 NOTE — Assessment & Plan Note (Addendum)
Continue with current prescription therapy as reflected on the Med list. Fluctuating sx's

## 2013-05-11 ENCOUNTER — Ambulatory Visit: Payer: Medicare Other | Admitting: Internal Medicine

## 2013-08-03 ENCOUNTER — Ambulatory Visit (INDEPENDENT_AMBULATORY_CARE_PROVIDER_SITE_OTHER): Payer: Medicare Other | Admitting: Internal Medicine

## 2013-08-03 ENCOUNTER — Encounter: Payer: Self-pay | Admitting: Internal Medicine

## 2013-08-03 VITALS — BP 118/80 | HR 72 | Temp 97.5°F | Resp 16 | Wt 160.8 lb

## 2013-08-03 DIAGNOSIS — E785 Hyperlipidemia, unspecified: Secondary | ICD-10-CM

## 2013-08-03 DIAGNOSIS — M159 Polyosteoarthritis, unspecified: Secondary | ICD-10-CM

## 2013-08-03 DIAGNOSIS — I10 Essential (primary) hypertension: Secondary | ICD-10-CM

## 2013-08-03 DIAGNOSIS — Z23 Encounter for immunization: Secondary | ICD-10-CM

## 2013-08-03 MED ORDER — BENAZEPRIL HCL 10 MG PO TABS: 10.0000 mg | ORAL_TABLET | Freq: Every day | ORAL | Status: DC

## 2013-08-03 MED ORDER — AMLODIPINE BESYLATE 5 MG PO TABS: 5.0000 mg | ORAL_TABLET | Freq: Every day | ORAL | Status: DC

## 2013-08-03 NOTE — Assessment & Plan Note (Signed)
Continue with current prescription therapy as reflected on the Med list.  

## 2013-08-03 NOTE — Assessment & Plan Note (Signed)
D/c Lovastatin due to pains

## 2013-08-03 NOTE — Assessment & Plan Note (Signed)
L hip hurts Continue with current prn prescription therapy as reflected on the Med list.

## 2013-08-03 NOTE — Progress Notes (Signed)
Pre visit review using our clinic review tool, if applicable. No additional management support is needed unless otherwise documented below in the visit note. 

## 2013-08-05 NOTE — Progress Notes (Signed)
   Subjective:     HPI  F/u GERD F/u stress, grief - better F/u HTN and dyslipidemia, blood in stool off and on last month; R shoulder pain - better C/o OA w/aches - better  Wt Readings from Last 3 Encounters:  08/03/13 160 lb 12 oz (72.916 kg)  05/10/13 160 lb 12.8 oz (72.938 kg)  01/12/13 152 lb (68.947 kg)    BP Readings from Last 3 Encounters:  08/03/13 118/80  05/10/13 110/82  01/12/13 130/80     Review of Systems  Constitutional: Negative for chills, activity change, appetite change and unexpected weight change.  HENT: Positive for congestion. Negative for mouth sores and sinus pressure.   Eyes: Negative for visual disturbance.  Respiratory: Positive for shortness of breath. Negative for chest tightness.   Genitourinary: Negative for frequency, difficulty urinating and vaginal pain.  Musculoskeletal: Negative for back pain and gait problem.  Skin: Negative for pallor.  Neurological: Positive for light-headedness (w/spells) and numbness (R hand chronic). Negative for dizziness, tremors and weakness.  Psychiatric/Behavioral: Negative for confusion and sleep disturbance.       Objective:   Physical Exam  Constitutional: She appears well-developed and well-nourished. No distress.  HENT:  Head: Normocephalic.  Right Ear: External ear normal.  Left Ear: External ear normal.  Nose: Nose normal.  Mouth/Throat: Oropharynx is clear and moist.  eryth throat  Eyes: Conjunctivae are normal. Pupils are equal, round, and reactive to light. Right eye exhibits no discharge. Left eye exhibits no discharge.  Neck: Normal range of motion. Neck supple. No JVD present. No tracheal deviation present. No thyromegaly present.  Cardiovascular: Normal rate, regular rhythm and normal heart sounds.   Pulmonary/Chest: No stridor. No respiratory distress. She has no wheezes.  Abdominal: Soft. Bowel sounds are normal. She exhibits no distension and no mass. There is no tenderness. There is  no rebound and no guarding.  Musculoskeletal: She exhibits no edema and no tenderness.  Lymphadenopathy:    She has no cervical adenopathy.  Neurological: She displays normal reflexes. No cranial nerve deficit. She exhibits normal muscle tone. Coordination normal.  Skin: No rash noted. No erythema.  Psychiatric: She has a normal mood and affect. Her behavior is normal. Judgment and thought content normal.  L dorsal mid-foot is with a tender swelling 2x3 cm  Lab Results  Component Value Date   WBC 16.4* 08/07/2008   HGB 15.6* 08/07/2008   HCT 46.0 08/07/2008   PLT 334 08/07/2008   GLUCOSE 136* 01/12/2013   GLUCOSE 132* 01/12/2013   CHOL 183 05/10/2013   TRIG 156.0* 05/10/2013   HDL 37.70* 05/10/2013   LDLDIRECT 131.2 02/03/2011   LDLCALC 114* 05/10/2013   ALT 37* 05/10/2013   AST 27 05/10/2013   NA 139 01/12/2013   NA 141 01/12/2013   K 3.4* 01/12/2013   K 3.6 01/12/2013   CL 107 01/12/2013   CL 108 01/12/2013   CREATININE 0.9 01/12/2013   CREATININE 0.9 01/12/2013   BUN 15 01/12/2013   BUN 15 01/12/2013   CO2 25 01/12/2013   CO2 18* 01/12/2013   TSH 1.42 05/10/2013   INR 1.0 04/19/2007   HGBA1C 6.2 05/10/2013           Assessment & Plan:

## 2013-10-04 ENCOUNTER — Telehealth: Payer: Self-pay | Admitting: *Deleted

## 2013-10-04 MED ORDER — AMLODIPINE BESYLATE 5 MG PO TABS: 5.0000 mg | ORAL_TABLET | Freq: Every day | ORAL | Status: DC

## 2013-10-04 MED ORDER — PANTOPRAZOLE SODIUM 40 MG PO TBEC: 40.0000 mg | DELAYED_RELEASE_TABLET | Freq: Every day | ORAL | Status: DC

## 2013-10-04 MED ORDER — BENAZEPRIL HCL 10 MG PO TABS: 10.0000 mg | ORAL_TABLET | Freq: Every day | ORAL | Status: DC

## 2013-10-04 NOTE — Telephone Encounter (Signed)
Patient phoned needing refills sent to her new mail order pharmacy[facility].  Last OV with PCP 08/03/13.  Refilled per protocol & patient notified.

## 2013-10-05 ENCOUNTER — Other Ambulatory Visit (INDEPENDENT_AMBULATORY_CARE_PROVIDER_SITE_OTHER): Payer: Commercial Managed Care - HMO

## 2013-10-05 DIAGNOSIS — Z23 Encounter for immunization: Secondary | ICD-10-CM

## 2013-10-05 DIAGNOSIS — M159 Polyosteoarthritis, unspecified: Secondary | ICD-10-CM

## 2013-10-05 DIAGNOSIS — E785 Hyperlipidemia, unspecified: Secondary | ICD-10-CM

## 2013-10-05 DIAGNOSIS — I10 Essential (primary) hypertension: Secondary | ICD-10-CM

## 2013-10-05 LAB — BASIC METABOLIC PANEL
BUN: 14 mg/dL (ref 6–23)
CALCIUM: 9.8 mg/dL (ref 8.4–10.5)
CO2: 30 mEq/L (ref 19–32)
Chloride: 103 mEq/L (ref 96–112)
Creatinine, Ser: 0.9 mg/dL (ref 0.4–1.2)
GFR: 80.35 mL/min (ref >60.00)
Glucose, Bld: 98 mg/dL (ref 70–99)
POTASSIUM: 4.1 meq/L (ref 3.5–5.1)
SODIUM: 138 meq/L (ref 135–145)

## 2013-10-05 LAB — LIPID PANEL
Cholesterol: 240 mg/dL — ABNORMAL HIGH (ref 0–200)
HDL: 41.9 mg/dL (ref >39.00)
TRIGLYCERIDES: 166 mg/dL — AB (ref 0.0–149.0)
Total CHOL/HDL Ratio: 6
VLDL: 33.2 mg/dL (ref 0.0–40.0)

## 2013-10-05 LAB — LDL CHOLESTEROL, DIRECT: LDL DIRECT: 159.8 mg/dL

## 2013-10-05 LAB — HEMOGLOBIN A1C: HEMOGLOBIN A1C: 6 % (ref 4.6–6.5)

## 2013-10-09 ENCOUNTER — Telehealth: Payer: Self-pay | Admitting: *Deleted

## 2013-10-09 NOTE — Telephone Encounter (Signed)
Message copied by Harl Bowie on Tue Oct 09, 2013  2:00 PM ------      Message from: Cassandria Anger      Created: Mon Oct 08, 2013 10:07 PM       Erline Levine, please, inform patient that all labs are normal except for elev cholesterol - higher than before      Thx       ------

## 2013-10-09 NOTE — Telephone Encounter (Signed)
Pt aware of recent lab results below.  Pt understood and agreed.  She stated she will work on her diet.//AB/CMA

## 2013-11-02 ENCOUNTER — Ambulatory Visit (INDEPENDENT_AMBULATORY_CARE_PROVIDER_SITE_OTHER): Payer: Commercial Managed Care - HMO | Admitting: Internal Medicine

## 2013-11-02 ENCOUNTER — Encounter: Payer: Self-pay | Admitting: Internal Medicine

## 2013-11-02 VITALS — BP 120/84 | HR 80 | Temp 98.2°F | Resp 16 | Wt 159.0 lb

## 2013-11-02 DIAGNOSIS — R7309 Other abnormal glucose: Secondary | ICD-10-CM

## 2013-11-02 DIAGNOSIS — K219 Gastro-esophageal reflux disease without esophagitis: Secondary | ICD-10-CM

## 2013-11-02 DIAGNOSIS — R609 Edema, unspecified: Secondary | ICD-10-CM

## 2013-11-02 DIAGNOSIS — I10 Essential (primary) hypertension: Secondary | ICD-10-CM

## 2013-11-02 DIAGNOSIS — E7800 Pure hypercholesterolemia, unspecified: Secondary | ICD-10-CM

## 2013-11-02 DIAGNOSIS — E119 Type 2 diabetes mellitus without complications: Secondary | ICD-10-CM

## 2013-11-02 NOTE — Assessment & Plan Note (Signed)
Continue with current prescription therapy as reflected on the Med list.  

## 2013-11-02 NOTE — Progress Notes (Signed)
Pre visit review using our clinic review tool, if applicable. No additional management support is needed unless otherwise documented below in the visit note. 

## 2013-11-02 NOTE — Progress Notes (Signed)
   Subjective:     HPI  F/u GERD F/u stress, grief - better F/u HTN and dyslipidemia, blood in stool off and on last month; R shoulder pain - better F/u OA w/aches - better  Wt Readings from Last 3 Encounters:  11/02/13 159 lb (72.122 kg)  08/03/13 160 lb 12 oz (72.916 kg)  05/10/13 160 lb 12.8 oz (72.938 kg)    BP Readings from Last 3 Encounters:  11/02/13 120/84  08/03/13 118/80  05/10/13 110/82     Review of Systems  Constitutional: Negative for chills, activity change, appetite change and unexpected weight change.  HENT: Positive for congestion. Negative for mouth sores and sinus pressure.   Eyes: Negative for visual disturbance.  Respiratory: Positive for shortness of breath. Negative for chest tightness.   Genitourinary: Negative for frequency, difficulty urinating and vaginal pain.  Musculoskeletal: Negative for back pain and gait problem.  Skin: Negative for pallor.  Neurological: Positive for light-headedness (w/spells) and numbness (R hand chronic). Negative for dizziness, tremors and weakness.  Psychiatric/Behavioral: Negative for confusion and sleep disturbance.       Objective:   Physical Exam  Constitutional: She appears well-developed and well-nourished. No distress.  HENT:  Head: Normocephalic.  Right Ear: External ear normal.  Left Ear: External ear normal.  Nose: Nose normal.  Mouth/Throat: Oropharynx is clear and moist.  eryth throat  Eyes: Conjunctivae are normal. Pupils are equal, round, and reactive to light. Right eye exhibits no discharge. Left eye exhibits no discharge.  Neck: Normal range of motion. Neck supple. No JVD present. No tracheal deviation present. No thyromegaly present.  Cardiovascular: Normal rate, regular rhythm and normal heart sounds.   Pulmonary/Chest: No stridor. No respiratory distress. She has no wheezes.  Abdominal: Soft. Bowel sounds are normal. She exhibits no distension and no mass. There is no tenderness. There is  no rebound and no guarding.  Musculoskeletal: She exhibits no edema and no tenderness.  Lymphadenopathy:    She has no cervical adenopathy.  Neurological: She displays normal reflexes. No cranial nerve deficit. She exhibits normal muscle tone. Coordination normal.  Skin: No rash noted. No erythema.  Psychiatric: She has a normal mood and affect. Her behavior is normal. Judgment and thought content normal.  L dorsal mid-foot is with a tender swelling 2x3 cm  Lab Results  Component Value Date   WBC 16.4* 08/07/2008   HGB 15.6* 08/07/2008   HCT 46.0 08/07/2008   PLT 334 08/07/2008   GLUCOSE 98 10/05/2013   CHOL 240* 10/05/2013   TRIG 166.0* 10/05/2013   HDL 41.90 10/05/2013   LDLDIRECT 159.8 10/05/2013   LDLCALC 114* 05/10/2013   ALT 37* 05/10/2013   AST 27 05/10/2013   NA 138 10/05/2013   K 4.1 10/05/2013   CL 103 10/05/2013   CREATININE 0.9 10/05/2013   BUN 14 10/05/2013   CO2 30 10/05/2013   TSH 1.42 05/10/2013   INR 1.0 04/19/2007   HGBA1C 6.0 10/05/2013           Assessment & Plan:

## 2013-11-02 NOTE — Assessment & Plan Note (Signed)
Resolved

## 2013-11-02 NOTE — Assessment & Plan Note (Signed)
Labs ok.

## 2013-11-02 NOTE — Assessment & Plan Note (Signed)
She can try to re-start low dose statin

## 2013-11-05 ENCOUNTER — Telehealth: Payer: Self-pay | Admitting: Internal Medicine

## 2013-11-05 NOTE — Telephone Encounter (Signed)
Relevant patient education assigned to patient using Emmi. ° °

## 2013-11-13 ENCOUNTER — Telehealth: Payer: Self-pay

## 2013-11-13 NOTE — Telephone Encounter (Signed)
Relevant patient education assigned to patient using Emmi. ° °

## 2013-11-26 ENCOUNTER — Encounter: Payer: Self-pay | Admitting: Internal Medicine

## 2013-11-26 ENCOUNTER — Ambulatory Visit: Payer: Commercial Managed Care - HMO | Admitting: Internal Medicine

## 2013-11-26 ENCOUNTER — Ambulatory Visit (INDEPENDENT_AMBULATORY_CARE_PROVIDER_SITE_OTHER): Payer: Commercial Managed Care - HMO | Admitting: Internal Medicine

## 2013-11-26 VITALS — BP 130/86 | HR 88 | Temp 100.9°F | Resp 16 | Wt 159.0 lb

## 2013-11-26 DIAGNOSIS — J069 Acute upper respiratory infection, unspecified: Secondary | ICD-10-CM

## 2013-11-26 MED ORDER — BENZONATATE 200 MG PO CAPS: 200.0000 mg | ORAL_CAPSULE | Freq: Two times a day (BID) | ORAL | Status: DC | PRN

## 2013-11-26 MED ORDER — PROMETHAZINE-CODEINE 6.25-10 MG/5ML PO SYRP: 5.0000 mL | ORAL_SOLUTION | ORAL | Status: DC | PRN

## 2013-11-26 MED ORDER — LEVOFLOXACIN 500 MG PO TABS: 500.0000 mg | ORAL_TABLET | Freq: Every day | ORAL | Status: DC

## 2013-11-26 NOTE — Assessment & Plan Note (Signed)
Levaquin Prom-cod syr Tessalon She had a CXR 1 wk ago for work

## 2013-11-26 NOTE — Progress Notes (Signed)
Pre visit review using our clinic review tool, if applicable. No additional management support is needed unless otherwise documented below in the visit note. 

## 2013-11-26 NOTE — Progress Notes (Signed)
Patient ID: Monica Horn, female   DOB: 05-Oct-1945, 68 y.o.   MRN: [id number]   Subjective:     URI  This is a new problem. The current episode started 1 to 4 weeks ago. The problem has been gradually worsening. The maximum temperature recorded prior to her arrival was 100 - 100.9 F. The fever has been present for 3 to 4 days. Associated symptoms include congestion, coughing, rhinorrhea, sinus pain and a sore throat. Pertinent negatives include no abdominal pain or wheezing. She has tried decongestant, antihistamine and acetaminophen for the symptoms. The treatment provided no relief.    F/u GERD F/u stress, grief - better F/u HTN and dyslipidemia, blood in stool off and on last month; R shoulder pain - better F/u OA w/aches - better  Wt Readings from Last 3 Encounters:  11/26/13 159 lb (72.122 kg)  11/02/13 159 lb (72.122 kg)  08/03/13 160 lb 12 oz (72.916 kg)    BP Readings from Last 3 Encounters:  11/26/13 130/86  11/02/13 120/84  08/03/13 118/80     Review of Systems  Constitutional: Negative for chills, activity change, appetite change and unexpected weight change.  HENT: Positive for congestion, rhinorrhea and sore throat. Negative for mouth sores and sinus pressure.   Eyes: Negative for visual disturbance.  Respiratory: Positive for cough and shortness of breath. Negative for chest tightness and wheezing.   Gastrointestinal: Negative for abdominal pain.  Genitourinary: Negative for frequency, difficulty urinating and vaginal pain.  Musculoskeletal: Negative for back pain and gait problem.  Skin: Negative for pallor.  Neurological: Positive for light-headedness (w/spells) and numbness (R hand chronic). Negative for dizziness, tremors and weakness.  Psychiatric/Behavioral: Negative for confusion and sleep disturbance.       Objective:   Physical Exam  Constitutional: She appears well-developed and well-nourished. No distress.  HENT:  Head: Normocephalic.  Right  Ear: External ear normal.  Left Ear: External ear normal.  Nose: Nose normal.  eryth throat  Eyes: Conjunctivae are normal. Pupils are equal, round, and reactive to light. Right eye exhibits no discharge. Left eye exhibits no discharge.  Neck: Normal range of motion. Neck supple. No JVD present. No tracheal deviation present. No thyromegaly present.  Cardiovascular: Normal rate, regular rhythm and normal heart sounds.   Pulmonary/Chest: No stridor. No respiratory distress. She has no wheezes.  Abdominal: Soft. Bowel sounds are normal. She exhibits no distension and no mass. There is no tenderness. There is no rebound and no guarding.  Musculoskeletal: She exhibits no edema and no tenderness.  Lymphadenopathy:    She has no cervical adenopathy.  Neurological: She displays normal reflexes. No cranial nerve deficit. She exhibits normal muscle tone. Coordination normal.  Skin: No rash noted. No erythema.  Psychiatric: She has a normal mood and affect. Her behavior is normal. Judgment and thought content normal.  L dorsal mid-foot is with a tender swelling 2x3 cm  Lab Results  Component Value Date   WBC 16.4* 08/07/2008   HGB 15.6* 08/07/2008   HCT 46.0 08/07/2008   PLT 334 08/07/2008   GLUCOSE 98 10/05/2013   CHOL 240* 10/05/2013   TRIG 166.0* 10/05/2013   HDL 41.90 10/05/2013   LDLDIRECT 159.8 10/05/2013   LDLCALC 114* 05/10/2013   ALT 37* 05/10/2013   AST 27 05/10/2013   NA 138 10/05/2013   K 4.1 10/05/2013   CL 103 10/05/2013   CREATININE 0.9 10/05/2013   BUN 14 10/05/2013   CO2 30 10/05/2013   TSH 1.42  05/10/2013   INR 1.0 04/19/2007   HGBA1C 6.0 10/05/2013           Assessment & Plan:

## 2013-11-27 ENCOUNTER — Telehealth: Payer: Self-pay

## 2013-11-27 NOTE — Telephone Encounter (Signed)
The patient called and is hoping to get clarification and a letter for her job about her diagnosis.  She was hoping this could be done through [place].  Thanks!   Pt Callback - [phone]

## 2013-11-28 NOTE — Telephone Encounter (Signed)
The patient called again regarding this note with the diagnosis code.  She states she can pick up a copy of this letter, or if someone can notify her if it is in [place].   Please call the patient back when you can, thanks!

## 2013-12-02 NOTE — Telephone Encounter (Signed)
pls write a letter Thx 

## 2013-12-13 ENCOUNTER — Telehealth: Payer: Self-pay | Admitting: *Deleted

## 2013-12-13 ENCOUNTER — Telehealth: Payer: Self-pay | Admitting: Internal Medicine

## 2013-12-13 MED ORDER — LEVOFLOXACIN 500 MG PO TABS: 500.0000 mg | ORAL_TABLET | Freq: Every day | ORAL | Status: DC

## 2013-12-13 NOTE — Telephone Encounter (Signed)
Unable to contact pt, no VM. 

## 2013-12-13 NOTE — Telephone Encounter (Signed)
Levaquin renewed. OV if not better - f/u Thx

## 2013-12-13 NOTE — Telephone Encounter (Signed)
Pt states she is not feeling any better.  She is requesting additional antibiotics.  Please advise

## 2013-12-14 ENCOUNTER — Other Ambulatory Visit: Payer: Self-pay | Admitting: *Deleted

## 2013-12-14 MED ORDER — LEVOFLOXACIN 500 MG PO TABS: 500.0000 mg | ORAL_TABLET | Freq: Every day | ORAL | Status: DC

## 2014-01-03 ENCOUNTER — Other Ambulatory Visit: Payer: Self-pay

## 2014-01-03 DIAGNOSIS — Z1231 Encounter for screening mammogram for malignant neoplasm of breast: Secondary | ICD-10-CM

## 2014-01-04 ENCOUNTER — Telehealth: Payer: Self-pay | Admitting: *Deleted

## 2014-01-04 ENCOUNTER — Other Ambulatory Visit (INDEPENDENT_AMBULATORY_CARE_PROVIDER_SITE_OTHER): Payer: Commercial Managed Care - HMO

## 2014-01-04 ENCOUNTER — Encounter: Payer: Self-pay | Admitting: Internal Medicine

## 2014-01-04 ENCOUNTER — Ambulatory Visit (INDEPENDENT_AMBULATORY_CARE_PROVIDER_SITE_OTHER): Payer: Commercial Managed Care - HMO | Admitting: Internal Medicine

## 2014-01-04 VITALS — BP 130/92 | HR 80 | Temp 97.3°F | Resp 16 | Wt 162.0 lb

## 2014-01-04 DIAGNOSIS — N390 Urinary tract infection, site not specified: Secondary | ICD-10-CM

## 2014-01-04 HISTORY — DX: Urinary tract infection, site not specified: N39.0

## 2014-01-04 LAB — URINALYSIS, ROUTINE W REFLEX MICROSCOPIC
BILIRUBIN URINE: NEGATIVE
Hgb urine dipstick: NEGATIVE
KETONES UR: NEGATIVE
Nitrite: NEGATIVE
Specific Gravity, Urine: 1.015 (ref 1.000–1.030)
Total Protein, Urine: NEGATIVE
URINE GLUCOSE: NEGATIVE
Urobilinogen, UA: 0.2 (ref 0.0–1.0)
pH: 6.5 (ref 5.0–8.0)

## 2014-01-04 MED ORDER — CIPROFLOXACIN HCL 250 MG PO TABS: 250.0000 mg | ORAL_TABLET | Freq: Two times a day (BID) | ORAL | Status: DC

## 2014-01-04 NOTE — Progress Notes (Signed)
Pre visit review using our clinic review tool, if applicable. No additional management support is needed unless otherwise documented below in the visit note. 

## 2014-01-04 NOTE — Telephone Encounter (Signed)
Left detailed mess informing pt to take Levaquin per MD due to abnormal UA.

## 2014-01-04 NOTE — Assessment & Plan Note (Addendum)
UA Levaquin 500 mg qd x7 d

## 2014-01-04 NOTE — Progress Notes (Signed)
   Subjective:     Dysuria  This is a new problem. The current episode started in the past 7 days. The quality of the pain is described as burning. The pain is moderate. There is no history of pyelonephritis. Pertinent negatives include no chills or frequency.    F/u GERD F/u stress, grief - better F/u HTN and dyslipidemia, blood in stool off and on last month; R shoulder pain - better F/u OA w/aches - better  Wt Readings from Last 3 Encounters:  01/04/14 162 lb (73.483 kg)  11/26/13 159 lb (72.122 kg)  11/02/13 159 lb (72.122 kg)    BP Readings from Last 3 Encounters:  01/04/14 130/92  11/26/13 130/86  11/02/13 120/84     Review of Systems  Constitutional: Negative for chills, activity change, appetite change and unexpected weight change.  HENT: Negative for mouth sores and sinus pressure.   Eyes: Negative for visual disturbance.  Respiratory: Positive for shortness of breath. Negative for chest tightness.   Genitourinary: Positive for dysuria. Negative for frequency, difficulty urinating and vaginal pain.  Musculoskeletal: Negative for back pain and gait problem.  Skin: Negative for pallor.  Neurological: Positive for light-headedness (w/spells) and numbness (R hand chronic). Negative for dizziness, tremors and weakness.  Psychiatric/Behavioral: Negative for confusion and sleep disturbance.       Objective:   Physical Exam  Constitutional: She appears well-developed and well-nourished. No distress.  HENT:  Head: Normocephalic.  Right Ear: External ear normal.  Left Ear: External ear normal.  Nose: Nose normal.  eryth throat  Eyes: Conjunctivae are normal. Pupils are equal, round, and reactive to light. Right eye exhibits no discharge. Left eye exhibits no discharge.  Neck: Normal range of motion. Neck supple. No JVD present. No tracheal deviation present. No thyromegaly present.  Cardiovascular: Normal rate, regular rhythm and normal heart sounds.    Pulmonary/Chest: No stridor. No respiratory distress. She has no wheezes.  Abdominal: Soft. Bowel sounds are normal. She exhibits no distension and no mass. There is no tenderness. There is no rebound and no guarding.  Musculoskeletal: She exhibits no edema and no tenderness.  Lymphadenopathy:    She has no cervical adenopathy.  Neurological: She displays normal reflexes. No cranial nerve deficit. She exhibits normal muscle tone. Coordination normal.  Skin: No rash noted. No erythema.  Psychiatric: She has a normal mood and affect. Her behavior is normal. Judgment and thought content normal.  L dorsal mid-foot is with a tender swelling 2x3 cm  Lab Results  Component Value Date   WBC 16.4* 08/07/2008   HGB 15.6* 08/07/2008   HCT 46.0 08/07/2008   PLT 334 08/07/2008   GLUCOSE 98 10/05/2013   CHOL 240* 10/05/2013   TRIG 166.0* 10/05/2013   HDL 41.90 10/05/2013   LDLDIRECT 159.8 10/05/2013   LDLCALC 114* 05/10/2013   ALT 37* 05/10/2013   AST 27 05/10/2013   NA 138 10/05/2013   K 4.1 10/05/2013   CL 103 10/05/2013   CREATININE 0.9 10/05/2013   BUN 14 10/05/2013   CO2 30 10/05/2013   TSH 1.42 05/10/2013   INR 1.0 04/19/2007   HGBA1C 6.0 10/05/2013           Assessment & Plan:

## 2014-02-08 ENCOUNTER — Ambulatory Visit (HOSPITAL_COMMUNITY)
Admission: RE | Admit: 2014-02-08 | Discharge: 2014-02-08 | Disposition: A | Discharge status: Home / Self Care | Payer: Medicare HMO | Source: Ambulatory Visit | Attending: Internal Medicine | Admitting: Internal Medicine

## 2014-02-08 ENCOUNTER — Ambulatory Visit: Payer: Commercial Managed Care - HMO

## 2014-02-08 ENCOUNTER — Other Ambulatory Visit: Payer: Self-pay | Admitting: Internal Medicine

## 2014-02-08 DIAGNOSIS — Z1231 Encounter for screening mammogram for malignant neoplasm of breast: Secondary | ICD-10-CM

## 2014-03-08 ENCOUNTER — Ambulatory Visit: Payer: Commercial Managed Care - HMO | Admitting: Internal Medicine

## 2014-03-22 ENCOUNTER — Other Ambulatory Visit (INDEPENDENT_AMBULATORY_CARE_PROVIDER_SITE_OTHER): Payer: Commercial Managed Care - HMO

## 2014-03-22 DIAGNOSIS — R7309 Other abnormal glucose: Secondary | ICD-10-CM

## 2014-03-22 DIAGNOSIS — R609 Edema, unspecified: Secondary | ICD-10-CM

## 2014-03-22 DIAGNOSIS — E7800 Pure hypercholesterolemia, unspecified: Secondary | ICD-10-CM

## 2014-03-22 DIAGNOSIS — K219 Gastro-esophageal reflux disease without esophagitis: Secondary | ICD-10-CM

## 2014-03-22 DIAGNOSIS — E119 Type 2 diabetes mellitus without complications: Secondary | ICD-10-CM

## 2014-03-22 DIAGNOSIS — I10 Essential (primary) hypertension: Secondary | ICD-10-CM

## 2014-03-22 LAB — HEPATIC FUNCTION PANEL
ALK PHOS: 120 U/L — AB (ref 39–117)
ALT: 47 U/L — ABNORMAL HIGH (ref 0–35)
AST: 36 U/L (ref 0–37)
Albumin: 4.1 g/dL (ref 3.5–5.2)
BILIRUBIN DIRECT: 0.1 mg/dL (ref 0.0–0.3)
Total Bilirubin: 0.5 mg/dL (ref 0.2–1.2)
Total Protein: 7.5 g/dL (ref 6.0–8.3)

## 2014-03-22 LAB — BASIC METABOLIC PANEL
BUN: 15 mg/dL (ref 6–23)
CALCIUM: 9.7 mg/dL (ref 8.4–10.5)
CO2: 25 mEq/L (ref 19–32)
Chloride: 103 mEq/L (ref 96–112)
Creatinine, Ser: 1 mg/dL (ref 0.4–1.2)
GFR: 75.38 mL/min (ref >60.00)
GLUCOSE: 99 mg/dL (ref 70–99)
POTASSIUM: 4.2 meq/L (ref 3.5–5.1)
SODIUM: 137 meq/L (ref 135–145)

## 2014-03-22 LAB — LIPID PANEL
CHOL/HDL RATIO: 5
Cholesterol: 197 mg/dL (ref 0–200)
HDL: 36.3 mg/dL — AB (ref >39.00)
LDL Cholesterol: 127 mg/dL — ABNORMAL HIGH (ref 0–99)
NONHDL: 160.7
Triglycerides: 170 mg/dL — ABNORMAL HIGH (ref 0.0–149.0)
VLDL: 34 mg/dL (ref 0.0–40.0)

## 2014-04-05 ENCOUNTER — Ambulatory Visit (INDEPENDENT_AMBULATORY_CARE_PROVIDER_SITE_OTHER): Payer: Commercial Managed Care - HMO | Admitting: Internal Medicine

## 2014-04-05 ENCOUNTER — Encounter: Payer: Self-pay | Admitting: Internal Medicine

## 2014-04-05 VITALS — BP 110/80 | HR 80 | Temp 98.2°F | Resp 16 | Wt 167.0 lb

## 2014-04-05 DIAGNOSIS — R21 Rash and other nonspecific skin eruption: Secondary | ICD-10-CM

## 2014-04-05 DIAGNOSIS — R609 Edema, unspecified: Secondary | ICD-10-CM

## 2014-04-05 DIAGNOSIS — K090 Developmental odontogenic cysts: Secondary | ICD-10-CM

## 2014-04-05 DIAGNOSIS — E119 Type 2 diabetes mellitus without complications: Secondary | ICD-10-CM

## 2014-04-05 HISTORY — DX: Developmental odontogenic cysts: K09.0

## 2014-04-05 HISTORY — DX: Rash and other nonspecific skin eruption: R21

## 2014-04-05 MED ORDER — AMLODIPINE BESYLATE 5 MG PO TABS: 2.5000 mg | ORAL_TABLET | Freq: Every day | ORAL | Status: DC

## 2014-04-05 NOTE — Assessment & Plan Note (Signed)
Reduce Amlodipine to 2.5 mg a day

## 2014-04-05 NOTE — Assessment & Plan Note (Signed)
8/15 recurrent - R inner thigh ?herpes - pt decline Rx

## 2014-04-05 NOTE — Assessment & Plan Note (Signed)
Doing well 

## 2014-04-05 NOTE — Progress Notes (Signed)
Pre visit review using our clinic review tool, if applicable. No additional management support is needed unless otherwise documented below in the visit note. 

## 2014-04-05 NOTE — Patient Instructions (Signed)
Reduce Amlodipine to 2.5 mg a day

## 2014-04-05 NOTE — Progress Notes (Signed)
   Subjective:     HPI  C/o ankle swelling x 1 mo C/o rash on L inner thigh - recurrent  F/u GERD F/u stress, grief - better F/u HTN and dyslipidemia, blood in stool off and on last month; R shoulder pain - better F/u OA w/aches - better  Wt Readings from Last 3 Encounters:  04/05/14 167 lb (75.751 kg)  01/04/14 162 lb (73.483 kg)  11/26/13 159 lb (72.122 kg)    BP Readings from Last 3 Encounters:  04/05/14 110/80  01/04/14 130/92  11/26/13 130/86     Review of Systems  Constitutional: Negative for activity change, appetite change and unexpected weight change.  HENT: Negative for mouth sores and sinus pressure.   Eyes: Negative for visual disturbance.  Respiratory: Positive for shortness of breath. Negative for chest tightness.   Genitourinary: Negative for difficulty urinating and vaginal pain.  Musculoskeletal: Negative for back pain and gait problem.  Skin: Negative for pallor.  Neurological: Positive for light-headedness (w/spells) and numbness (R hand chronic). Negative for dizziness, tremors and weakness.  Psychiatric/Behavioral: Negative for confusion and sleep disturbance.       Objective:   Physical Exam  Constitutional: She appears well-developed and well-nourished. No distress.  HENT:  Head: Normocephalic.  Right Ear: External ear normal.  Left Ear: External ear normal.  Nose: Nose normal.  eryth throat  Eyes: Conjunctivae are normal. Pupils are equal, round, and reactive to light. Right eye exhibits no discharge. Left eye exhibits no discharge.  Neck: Normal range of motion. Neck supple. No JVD present. No tracheal deviation present. No thyromegaly present.  Cardiovascular: Normal rate, regular rhythm and normal heart sounds.   Pulmonary/Chest: No stridor. No respiratory distress. She has no wheezes.  Abdominal: Soft. Bowel sounds are normal. She exhibits no distension and no mass. There is no tenderness. There is no rebound and no guarding.   Musculoskeletal: She exhibits no edema and no tenderness.  Lymphadenopathy:    She has no cervical adenopathy.  Neurological: She displays normal reflexes. No cranial nerve deficit. She exhibits normal muscle tone. Coordination normal.  Skin: No rash noted. No erythema.  Psychiatric: She has a normal mood and affect. Her behavior is normal. Judgment and thought content normal.  L dorsal mid-foot is with a tender swelling 2x3 cm Blister cluster on R inner thigh - recurrent   Lab Results  Component Value Date   WBC 16.4* 08/07/2008   HGB 15.6* 08/07/2008   HCT 46.0 08/07/2008   PLT 334 08/07/2008   GLUCOSE 99 03/22/2014   CHOL 197 03/22/2014   TRIG 170.0* 03/22/2014   HDL 36.30* 03/22/2014   LDLDIRECT 159.8 10/05/2013   LDLCALC 127* 03/22/2014   ALT 47* 03/22/2014   AST 36 03/22/2014   NA 137 03/22/2014   K 4.2 03/22/2014   CL 103 03/22/2014   CREATININE 1.0 03/22/2014   BUN 15 03/22/2014   CO2 25 03/22/2014   TSH 1.42 05/10/2013   INR 1.0 04/19/2007   HGBA1C 6.0 10/05/2013           Assessment & Plan:

## 2014-05-28 ENCOUNTER — Other Ambulatory Visit: Payer: Self-pay | Admitting: Internal Medicine

## 2014-07-02 ENCOUNTER — Other Ambulatory Visit (INDEPENDENT_AMBULATORY_CARE_PROVIDER_SITE_OTHER): Payer: Medicare HMO

## 2014-07-02 ENCOUNTER — Encounter: Payer: Self-pay | Admitting: Internal Medicine

## 2014-07-02 ENCOUNTER — Ambulatory Visit (INDEPENDENT_AMBULATORY_CARE_PROVIDER_SITE_OTHER): Payer: Commercial Managed Care - HMO | Admitting: Internal Medicine

## 2014-07-02 VITALS — BP 128/80 | HR 103 | Temp 97.8°F | Wt 167.0 lb

## 2014-07-02 DIAGNOSIS — I10 Essential (primary) hypertension: Secondary | ICD-10-CM

## 2014-07-02 DIAGNOSIS — M2550 Pain in unspecified joint: Secondary | ICD-10-CM

## 2014-07-02 DIAGNOSIS — J069 Acute upper respiratory infection, unspecified: Secondary | ICD-10-CM

## 2014-07-02 LAB — SEDIMENTATION RATE: Sed Rate: 16 mm/hr (ref 0–22)

## 2014-07-02 LAB — RHEUMATOID FACTOR: Rhuematoid fact SerPl-aCnc: <10 IU/mL (ref <=14)

## 2014-07-02 MED ORDER — AZITHROMYCIN 250 MG PO TABS: ORAL_TABLET | ORAL | Status: DC

## 2014-07-02 NOTE — Progress Notes (Signed)
Pre visit review using our clinic review tool, if applicable. No additional management support is needed unless otherwise documented below in the visit note. 

## 2014-07-02 NOTE — Assessment & Plan Note (Signed)
Continue with current prescription therapy as reflected on the Med list.  

## 2014-07-02 NOTE — Progress Notes (Signed)
   Subjective:     HPI  C/o L wrist and R ankle pain and swelling x 1 mo. C/o URI x 1 week  F/u GERD F/u stress, grief - better F/u HTN and dyslipidemia, blood in stool off and on last month; R shoulder pain - better F/u OA w/aches - better  Wt Readings from Last 3 Encounters:  07/02/14 167 lb (75.751 kg)  04/05/14 167 lb (75.751 kg)  01/04/14 162 lb (73.483 kg)    BP Readings from Last 3 Encounters:  07/02/14 128/80  04/05/14 110/80  01/04/14 130/92     Review of Systems  Constitutional: Negative for activity change, appetite change and unexpected weight change.  HENT: Negative for mouth sores and sinus pressure.   Eyes: Negative for visual disturbance.  Respiratory: Positive for shortness of breath. Negative for chest tightness.   Genitourinary: Negative for difficulty urinating and vaginal pain.  Musculoskeletal: Positive for arthralgias. Negative for back pain and gait problem.  Skin: Negative for pallor.  Neurological: Positive for light-headedness (w/spells) and numbness (R hand chronic). Negative for dizziness, tremors and weakness.  Psychiatric/Behavioral: Negative for confusion and sleep disturbance.       Objective:   Physical Exam  Constitutional: She appears well-developed. No distress.  HENT:  Head: Normocephalic.  Right Ear: External ear normal.  Left Ear: External ear normal.  Nose: Nose normal.  Mouth/Throat: Oropharynx is clear and moist.  Eyes: Conjunctivae are normal. Pupils are equal, round, and reactive to light. Right eye exhibits no discharge. Left eye exhibits no discharge.  Neck: Normal range of motion. Neck supple. No JVD present. No tracheal deviation present. No thyromegaly present.  Cardiovascular: Normal rate, regular rhythm and normal heart sounds.   Pulmonary/Chest: No stridor. No respiratory distress. She has no wheezes.  Abdominal: Soft. Bowel sounds are normal. She exhibits no distension and no mass. There is no tenderness. There  is no rebound and no guarding.  Musculoskeletal: She exhibits no edema or tenderness.  Lymphadenopathy:    She has no cervical adenopathy.  Neurological: She displays normal reflexes. No cranial nerve deficit. She exhibits normal muscle tone. Coordination normal.  Skin: No rash noted. No erythema.  Psychiatric: She has a normal mood and affect. Her behavior is normal. Judgment and thought content normal.  R ankle and dorsal mid-foot is with a tenderness L wrist is swollen and tender     Lab Results  Component Value Date   WBC 16.4* 08/07/2008   HGB 15.6* 08/07/2008   HCT 46.0 08/07/2008   PLT 334 08/07/2008   GLUCOSE 99 03/22/2014   CHOL 197 03/22/2014   TRIG 170.0* 03/22/2014   HDL 36.30* 03/22/2014   LDLDIRECT 159.8 10/05/2013   LDLCALC 127* 03/22/2014   ALT 47* 03/22/2014   AST 36 03/22/2014   NA 137 03/22/2014   K 4.2 03/22/2014   CL 103 03/22/2014   CREATININE 1.0 03/22/2014   BUN 15 03/22/2014   CO2 25 03/22/2014   TSH 1.42 05/10/2013   INR 1.0 04/19/2007   HGBA1C 6.0 10/05/2013           Assessment & Plan:  Patient ID: Monica Horn, female   DOB: [birth year], 68 y.o.   MRN: [id number]

## 2014-07-02 NOTE — Assessment & Plan Note (Signed)
Zpac if worse 

## 2014-07-02 NOTE — Assessment & Plan Note (Signed)
11/15 L wrist/R ankle - r/o gout vs other Labs See Rx

## 2014-07-03 LAB — BASIC METABOLIC PANEL WITH GFR
BUN: 16 mg/dL (ref 6–23)
CO2: 21 meq/L (ref 19–32)
Calcium: 10 mg/dL (ref 8.4–10.5)
Chloride: 108 meq/L (ref 96–112)
Creatinine, Ser: 0.9 mg/dL (ref 0.4–1.2)
GFR: 76.25 mL/min
Glucose, Bld: 120 mg/dL — ABNORMAL HIGH (ref 70–99)
Potassium: 4 meq/L (ref 3.5–5.1)
Sodium: 142 meq/L (ref 135–145)

## 2014-07-03 LAB — URIC ACID: Uric Acid, Serum: 5 mg/dL (ref 2.4–7.0)

## 2014-07-17 ENCOUNTER — Ambulatory Visit: Payer: Commercial Managed Care - HMO | Admitting: Internal Medicine

## 2014-08-02 ENCOUNTER — Ambulatory Visit (INDEPENDENT_AMBULATORY_CARE_PROVIDER_SITE_OTHER): Payer: Commercial Managed Care - HMO | Admitting: Internal Medicine

## 2014-08-02 ENCOUNTER — Encounter: Payer: Self-pay | Admitting: Internal Medicine

## 2014-08-02 VITALS — BP 128/90 | HR 93 | Temp 98.1°F | Wt 167.0 lb

## 2014-08-02 DIAGNOSIS — E119 Type 2 diabetes mellitus without complications: Secondary | ICD-10-CM

## 2014-08-02 DIAGNOSIS — M2550 Pain in unspecified joint: Secondary | ICD-10-CM

## 2014-08-02 DIAGNOSIS — Z23 Encounter for immunization: Secondary | ICD-10-CM

## 2014-08-02 MED ORDER — IBUPROFEN 400 MG PO TABS: 400.0000 mg | ORAL_TABLET | Freq: Three times a day (TID) | ORAL | Status: DC | PRN

## 2014-08-02 MED ORDER — AMOXICILLIN 500 MG PO CAPS: 1000.0000 mg | ORAL_CAPSULE | Freq: Two times a day (BID) | ORAL | Status: DC

## 2014-08-02 NOTE — Progress Notes (Signed)
   Subjective:     HPI  F/u L wrist pain - not better and R ankle pain better and swelling x 1 mo. C/o URI x 1 mo C/o URI sx's  F/u GERD F/u stress, grief - better F/u HTN and dyslipidemia, blood in stool off and on last month; R shoulder pain - better F/u OA w/aches - better  Wt Readings from Last 3 Encounters:  08/02/14 167 lb (75.751 kg)  07/02/14 167 lb (75.751 kg)  04/05/14 167 lb (75.751 kg)    BP Readings from Last 3 Encounters:  08/02/14 128/90  07/02/14 128/80  04/05/14 110/80     Review of Systems  Constitutional: Negative for activity change, appetite change and unexpected weight change.  HENT: Negative for mouth sores and sinus pressure.   Eyes: Negative for visual disturbance.  Respiratory: Positive for shortness of breath. Negative for chest tightness.   Genitourinary: Negative for difficulty urinating and vaginal pain.  Musculoskeletal: Positive for arthralgias. Negative for back pain and gait problem.  Skin: Negative for pallor.  Neurological: Positive for light-headedness (w/spells) and numbness (R hand chronic). Negative for dizziness, tremors and weakness.  Psychiatric/Behavioral: Negative for confusion and sleep disturbance.       Objective:   Physical Exam  Constitutional: She appears well-developed. No distress.  HENT:  Head: Normocephalic.  Right Ear: External ear normal.  Left Ear: External ear normal.  Nose: Nose normal.  Mouth/Throat: Oropharynx is clear and moist.  Eyes: Conjunctivae are normal. Pupils are equal, round, and reactive to light. Right eye exhibits no discharge. Left eye exhibits no discharge.  Neck: Normal range of motion. Neck supple. No JVD present. No tracheal deviation present. No thyromegaly present.  Cardiovascular: Normal rate, regular rhythm and normal heart sounds.   Pulmonary/Chest: No stridor. No respiratory distress. She has no wheezes.  Abdominal: Soft. Bowel sounds are normal. She exhibits no distension and no  mass. There is no tenderness. There is no rebound and no guarding.  Musculoskeletal: She exhibits no edema or tenderness.  Lymphadenopathy:    She has no cervical adenopathy.  Neurological: She displays normal reflexes. No cranial nerve deficit. She exhibits normal muscle tone. Coordination normal.  Skin: No rash noted. No erythema.  Psychiatric: She has a normal mood and affect. Her behavior is normal. Judgment and thought content normal.  R ankle and dorsal mid-foot is with/o tenderness L wrist is swollen and tender - abd poll longus is tender Eryth nasal mucosa     Lab Results  Component Value Date   WBC 16.4* 08/07/2008   HGB 15.6* 08/07/2008   HCT 46.0 08/07/2008   PLT 334 08/07/2008   GLUCOSE 120* 07/02/2014   CHOL 197 03/22/2014   TRIG 170.0* 03/22/2014   HDL 36.30* 03/22/2014   LDLDIRECT 159.8 10/05/2013   LDLCALC 127* 03/22/2014   ALT 47* 03/22/2014   AST 36 03/22/2014   NA 142 07/02/2014   K 4.0 07/02/2014   CL 108 07/02/2014   CREATININE 0.9 07/02/2014   BUN 16 07/02/2014   CO2 21 07/02/2014   TSH 1.42 05/10/2013   INR 1.0 04/19/2007   HGBA1C 6.0 10/05/2013           Assessment & Plan:

## 2014-08-02 NOTE — Progress Notes (Signed)
Pre visit review using our clinic review tool, if applicable. No additional management support is needed unless otherwise documented below in the visit note. 

## 2014-08-02 NOTE — Assessment & Plan Note (Addendum)
Continue with current prescription therapy as reflected on the Med list. ACE

## 2014-08-02 NOTE — Assessment & Plan Note (Signed)
Chronic. 

## 2014-08-05 ENCOUNTER — Encounter: Payer: Self-pay | Admitting: Internal Medicine

## 2014-08-05 NOTE — Assessment & Plan Note (Signed)
Better  

## 2014-08-13 NOTE — Telephone Encounter (Signed)
na

## 2014-09-27 ENCOUNTER — Encounter: Payer: Self-pay | Admitting: Family

## 2014-09-27 ENCOUNTER — Ambulatory Visit (INDEPENDENT_AMBULATORY_CARE_PROVIDER_SITE_OTHER): Payer: Commercial Managed Care - HMO | Admitting: Family

## 2014-09-27 VITALS — BP 138/90 | HR 94 | Temp 97.6°F | Resp 18 | Ht 61.0 in | Wt 163.8 lb

## 2014-09-27 DIAGNOSIS — L600 Ingrowing nail: Secondary | ICD-10-CM

## 2014-09-27 HISTORY — DX: Ingrowing nail: L60.0

## 2014-09-27 NOTE — Progress Notes (Signed)
Pre visit review using our clinic review tool, if applicable. No additional management support is needed unless otherwise documented below in the visit note. 

## 2014-09-27 NOTE — Assessment & Plan Note (Signed)
Right ingrown toenail with no evidence of infection. Patient will try conservative management and soak in Epson salt. If symptoms worsen or fail to improve, will consider invasive option.

## 2014-09-27 NOTE — Progress Notes (Signed)
   Subjective:    Patient ID: Monica Horn, female    DOB: [birth year], 69 y.o.   MRN: [id number]  Chief Complaint  Patient presents with  . Foot Pain    Right foot big toe pain, at the corner of toe, does not remember hit it on anything, says its tender and may be a little bruised, every now and then her toe does throb x1 month    HPI:  Monica Horn is a 69 y.o. female who presents today for an acute visit.  Associated symptom or sore and occasionally throbbing pain in her right big toe has been going on for about 1 month. Denies any trauma to her foot. Previously noted to have a slightly ingrown toenail when having her feet done. She has tried warm water soaks which helps some. Concerned for potential infection.  Allergies  Allergen Reactions  . Lovastatin     achy    Current Outpatient Prescriptions on File Prior to Visit  Medication Sig Dispense Refill  . amLODipine (NORVASC) 5 MG tablet Take 0.5 tablets (2.5 mg total) by mouth daily. 90 tablet 3  . amoxicillin (AMOXIL) 500 MG capsule Take 2 capsules (1,000 mg total) by mouth 2 (two) times daily. 40 capsule 0  . benazepril (LOTENSIN) 10 MG tablet Take 1 tablet (10 mg total) by mouth daily. 90 tablet 3  . cetirizine (ZYRTEC) 10 MG tablet Take 1 tablet (10 mg total) by mouth daily as needed. 90 tablet 3  . Cholecalciferol (VITAMIN D3) 1000 UNITS tablet Take 1,000 Units by mouth daily.      Marland Kitchen ibuprofen (ADVIL,MOTRIN) 400 MG tablet Take 1 tablet (400 mg total) by mouth every 8 (eight) hours as needed. for pain 60 tablet 0  . pantoprazole (PROTONIX) 40 MG tablet Take 1 tablet (40 mg total) by mouth daily. 90 tablet 3   No current facility-administered medications on file prior to visit.    Review of Systems  Constitutional: Negative for fever and chills.  Musculoskeletal:       Positive for great toe pain      Objective:    BP 138/90 mmHg  Pulse 94  Temp(Src) 97.6 F (36.4 C) (Oral)  Resp 18  Ht 5\' 1"  (1.549 m)  Wt  163 lb 12.8 oz (74.299 kg)  BMI 30.97 kg/m2  SpO2 98% Nursing note and vital signs reviewed.  Physical Exam  Constitutional: She is oriented to person, place, and time. She appears well-developed and well-nourished. No distress.  Cardiovascular: Normal rate, regular rhythm, normal heart sounds and intact distal pulses.   Pulmonary/Chest: Effort normal and breath sounds normal.  Musculoskeletal:  Right great toe: No obvious discoloration, deformity, or discharge. Tenderness elicited on the medial aspect of the great toenail which appears mildly ingrown. No redness or warmth noted.  Neurological: She is alert and oriented to person, place, and time.  Skin: Skin is warm and dry.  Psychiatric: She has a normal mood and affect. Her behavior is normal. Judgment and thought content normal.       Assessment & Plan:

## 2014-09-27 NOTE — Patient Instructions (Signed)
Thank you for choosing [facility].  Summary/Instructions:   If your symptoms worsen or fail to improve, please contact our office for further instruction, or in case of emergency go directly to the emergency room at the closest medical facility.   Ingrown Toenail An ingrown toenail occurs when the sharp edge of your toenail grows into the skin. Causes of ingrown toenails include toenails clipped too far back or poorly fitting shoes. Activities involving sudden stops (basketball, tennis) causing "toe jamming" may lead to an ingrown nail. HOME CARE INSTRUCTIONS   Soak the whole foot in warm soapy water for 20 minutes, 3 times per day.  You may lift the edge of the nail away from the sore skin by wedging a small piece of cotton under the corner of the nail. Be careful not to dig (traumatize) and cause more injury to the area.  Wear shoes that fit well. While the ingrown nail is causing problems, sandals may be beneficial.  Trim your toenails regularly and carefully. Cut your toenails straight across, not in a curve. This will prevent injury to the skin at the corners of the toenail.  Keep your feet clean and dry.  Crutches may be helpful early in treatment if walking is painful.  Antibiotics, if prescribed, should be taken as directed.  Return for a wound check in 2 days or as directed.  Only take over-the-counter or prescription medicines for pain, discomfort, or fever as directed by your caregiver. SEEK IMMEDIATE MEDICAL CARE IF:   You have a fever.  You have increasing pain, redness, swelling, or heat at the wound site.  Your toe is not better in 7 days. If conservative treatment is not successful, surgical removal of a portion or all of the nail may be necessary. MAKE SURE YOU:   Understand these instructions.  Will watch your condition.  Will get help right away if you are not doing well or get worse. Document Released: 07/30/2000 Document Revised: 10/25/2011  Document Reviewed: 07/24/2008 [facility] Patient Information 2015 [place], [place]. This information is not intended to replace advice given to you by your health care provider. Make sure you discuss any questions you have with your health care provider.

## 2014-10-24 ENCOUNTER — Encounter: Payer: Self-pay | Admitting: Family

## 2014-10-24 ENCOUNTER — Telehealth: Payer: Self-pay | Admitting: Internal Medicine

## 2014-10-24 ENCOUNTER — Ambulatory Visit (INDEPENDENT_AMBULATORY_CARE_PROVIDER_SITE_OTHER): Payer: Commercial Managed Care - HMO | Admitting: Family

## 2014-10-24 VITALS — BP 124/88 | HR 95 | Temp 98.0°F | Resp 18 | Wt 164.0 lb

## 2014-10-24 DIAGNOSIS — R05 Cough: Secondary | ICD-10-CM

## 2014-10-24 DIAGNOSIS — R059 Cough, unspecified: Secondary | ICD-10-CM | POA: Insufficient documentation

## 2014-10-24 MED ORDER — CEFUROXIME AXETIL 250 MG PO TABS: 250.0000 mg | ORAL_TABLET | Freq: Two times a day (BID) | ORAL | Status: DC

## 2014-10-24 MED ORDER — AMOXICILLIN 500 MG PO CAPS: 1000.0000 mg | ORAL_CAPSULE | Freq: Two times a day (BID) | ORAL | Status: DC

## 2014-10-24 NOTE — Telephone Encounter (Signed)
Pt called in said that the meds that was sent today, she can not take.  Pt wanted to see if he can call in [address]

## 2014-10-24 NOTE — Progress Notes (Signed)
   Subjective:    Patient ID: Monica Horn, female    DOB: 1946/06/29, 69 y.o.   MRN: [id number]  Chief Complaint  Patient presents with  . Nasal Congestion    x1 week, started off with a sore throat, now she has congestion, drainage, and cough, need note for work faxed to [phone]    HPI:  Monica Horn is a 69 y.o. female who presents today for an acute visit.  This is a new problem. Associated symptoms of sore throat, congestion, drainage and cough has been going on for about 1 week. Has tried Dayquil, Nyquil, ibuprofen and chloraseptic which have provided minimal relief. Has recently used amoxicillin.    Allergies  Allergen Reactions  . Lovastatin     achy    Current Outpatient Prescriptions on File Prior to Visit  Medication Sig Dispense Refill  . amoxicillin (AMOXIL) 500 MG capsule Take 2 capsules (1,000 mg total) by mouth 2 (two) times daily. 40 capsule 0  . benazepril (LOTENSIN) 10 MG tablet Take 1 tablet (10 mg total) by mouth daily. 90 tablet 3  . cetirizine (ZYRTEC) 10 MG tablet Take 1 tablet (10 mg total) by mouth daily as needed. 90 tablet 3  . Cholecalciferol (VITAMIN D3) 1000 UNITS tablet Take 1,000 Units by mouth daily.      Marland Kitchen ibuprofen (ADVIL,MOTRIN) 400 MG tablet Take 1 tablet (400 mg total) by mouth every 8 (eight) hours as needed. for pain 60 tablet 0  . pantoprazole (PROTONIX) 40 MG tablet Take 1 tablet (40 mg total) by mouth daily. 90 tablet 3   No current facility-administered medications on file prior to visit.    Review of Systems  Constitutional: Negative for fever and chills.  HENT: Positive for congestion and sore throat.   Respiratory: Positive for cough. Negative for chest tightness and shortness of breath.   Neurological: Negative for headaches.      Objective:    BP 124/88 mmHg  Pulse 95  Temp(Src) 98 F (36.7 C) (Oral)  Resp 18  Wt 164 lb (74.39 kg)  SpO2 98% Nursing note and vital signs reviewed.  Physical Exam    Constitutional: She is oriented to person, place, and time. She appears well-developed and well-nourished. No distress.  HENT:  Right Ear: Hearing, external ear and ear canal normal.  Left Ear: Hearing, external ear and ear canal normal.  Nose: Nose normal. Right sinus exhibits no maxillary sinus tenderness and no frontal sinus tenderness. Left sinus exhibits no maxillary sinus tenderness and no frontal sinus tenderness.  Mouth/Throat: Uvula is midline, oropharynx is clear and moist and mucous membranes are normal.  Moderate cerumen noted bilaterally.   Cardiovascular: Normal rate, regular rhythm, normal heart sounds and intact distal pulses.   Pulmonary/Chest: Effort normal and breath sounds normal.  Neurological: She is alert and oriented to person, place, and time.  Skin: Skin is warm and dry.  Psychiatric: She has a normal mood and affect. Her behavior is normal. Judgment and thought content normal.       Assessment & Plan:

## 2014-10-24 NOTE — Progress Notes (Signed)
Pre visit review using our clinic review tool, if applicable. No additional management support is needed unless otherwise documented below in the visit note. 

## 2014-10-24 NOTE — Assessment & Plan Note (Signed)
Symptoms and exam consistent with upper respiratory infection however cannot rule out bacterial pharyngitis. Start Ceftin. Continue over-the-counter medications as needed for symptom relief and supportive care. Follow-up if symptoms worsen or fail to improve.

## 2014-10-24 NOTE — Patient Instructions (Addendum)
Thank you for choosing [facility].  Summary/Instructions:  Your prescription(s) have been submitted to your pharmacy or been printed and provided for you. Please take as directed and contact our office if you believe you are having problem(s) with the medication(s) or have any questions.  If your symptoms worsen or fail to improve, please contact our office for further instruction, or in case of emergency go directly to the emergency room at the closest medical facility.   General Recommendations:    Please drink plenty of fluids.  Get plenty of rest   Sleep in humidified air  Use saline nasal sprays  Netti pot   OTC Medications:  Decongestants - helps relieve congestion   Flonase (generic fluticasone) or Nasacort (generic triamcinolone) - please make sure to use the "cross-over" technique at a 45 degree angle towards the opposite eye as opposed to straight up the nasal passageway.   Sudafed (generic pseudoephedrine - Note this is the one that is available behind the pharmacy counter); Products with phenylephrine (-PE) may also be used but is often not as effective as pseudoephedrine.   If you have HIGH BLOOD PRESSURE - Coricidin HBP; AVOID any product that is -D as this contains pseudoephedrine which may increase your blood pressure.  Afrin (oxymetazoline) every 6-8 hours for up to 3 days.   Allergies - helps relieve runny nose, itchy eyes and sneezing   Claritin (generic loratidine), Allegra (fexofenidine), or Zyrtec (generic cyrterizine) for runny nose. These medications should not cause drowsiness.  Note - Benadryl (generic diphenhydramine) may be used however may cause drowsiness  Cough -   Delsym or Robitussin (generic dextromethorphan)  Expectorants - helps loosen mucus to ease removal   Mucinex (generic guaifenesin) as directed on the package.  Headaches / General Aches   Tylenol (generic acetaminophen) - DO NOT EXCEED 3 grams (3,000 mg) in a 24  hour time period  Advil/Motrin (generic ibuprofen)   Sore Throat -   Salt water gargle   Chloraseptic (generic benzocaine) spray or lozenges / Sucrets (generic dyclonine)      

## 2014-10-25 NOTE — Telephone Encounter (Signed)
Pt aware.

## 2014-10-25 NOTE — Telephone Encounter (Signed)
Prescription for amoxicillin sent. 

## 2014-11-01 ENCOUNTER — Telehealth: Payer: Self-pay | Admitting: Internal Medicine

## 2014-11-01 ENCOUNTER — Ambulatory Visit: Payer: Commercial Managed Care - HMO | Admitting: Internal Medicine

## 2014-11-01 NOTE — Telephone Encounter (Signed)
Noted. Thx.

## 2014-11-01 NOTE — Telephone Encounter (Signed)
Patient no showed for visit today.  Please advise.

## 2014-11-11 ENCOUNTER — Other Ambulatory Visit: Payer: Self-pay | Admitting: Internal Medicine

## 2014-11-13 ENCOUNTER — Ambulatory Visit (INDEPENDENT_AMBULATORY_CARE_PROVIDER_SITE_OTHER): Payer: Commercial Managed Care - HMO | Admitting: Internal Medicine

## 2014-11-13 ENCOUNTER — Encounter: Payer: Self-pay | Admitting: Internal Medicine

## 2014-11-13 VITALS — BP 132/85 | HR 97 | Wt 163.0 lb

## 2014-11-13 DIAGNOSIS — I10 Essential (primary) hypertension: Secondary | ICD-10-CM | POA: Diagnosis not present

## 2014-11-13 DIAGNOSIS — E119 Type 2 diabetes mellitus without complications: Secondary | ICD-10-CM

## 2014-11-13 DIAGNOSIS — M159 Polyosteoarthritis, unspecified: Secondary | ICD-10-CM

## 2014-11-13 DIAGNOSIS — J069 Acute upper respiratory infection, unspecified: Secondary | ICD-10-CM

## 2014-11-13 DIAGNOSIS — M150 Primary generalized (osteo)arthritis: Secondary | ICD-10-CM | POA: Diagnosis not present

## 2014-11-13 NOTE — Assessment & Plan Note (Signed)
A1c

## 2014-11-13 NOTE — Assessment & Plan Note (Signed)
Dr Wynelle Link ref - L hip

## 2014-11-13 NOTE — Progress Notes (Addendum)
   Subjective:     HPI  F/u L hip pain. C/o URI x 1 mo F/u URI sx's - took Amoxicillin  F/u GERD F/u stress, grief - better F/u HTN and dyslipidemia, blood in stool off and on last month; R shoulder pain - better F/u OA w/aches - better  Wt Readings from Last 3 Encounters:  11/13/14 163 lb (73.936 kg)  10/24/14 164 lb (74.39 kg)  09/27/14 163 lb 12.8 oz (74.299 kg)    BP Readings from Last 3 Encounters:  11/13/14 132/90  10/24/14 124/88  09/27/14 138/90     Review of Systems  Constitutional: Negative for activity change, appetite change and unexpected weight change.  HENT: Negative for mouth sores and sinus pressure.   Eyes: Negative for visual disturbance.  Respiratory: Positive for shortness of breath. Negative for chest tightness.   Genitourinary: Negative for difficulty urinating and vaginal pain.  Musculoskeletal: Positive for arthralgias. Negative for back pain and gait problem.  Skin: Negative for pallor.  Neurological: Positive for light-headedness (w/spells) and numbness (R hand chronic). Negative for dizziness, tremors and weakness.  Psychiatric/Behavioral: Negative for confusion and sleep disturbance.       Objective:   Physical Exam  Constitutional: She appears well-developed. No distress.  HENT:  Head: Normocephalic.  Right Ear: External ear normal.  Left Ear: External ear normal.  Nose: Nose normal.  Mouth/Throat: Oropharynx is clear and moist.  Eyes: Conjunctivae are normal. Pupils are equal, round, and reactive to light. Right eye exhibits no discharge. Left eye exhibits no discharge.  Neck: Normal range of motion. Neck supple. No JVD present. No tracheal deviation present. No thyromegaly present.  Cardiovascular: Normal rate, regular rhythm and normal heart sounds.   Pulmonary/Chest: No stridor. No respiratory distress. She has no wheezes.  Abdominal: Soft. Bowel sounds are normal. She exhibits no distension and no mass. There is no tenderness.  There is no rebound and no guarding.  Musculoskeletal: She exhibits no edema or tenderness.  Lymphadenopathy:    She has no cervical adenopathy.  Neurological: She displays normal reflexes. No cranial nerve deficit. She exhibits normal muscle tone. Coordination normal.  Skin: No rash noted. No erythema.  Psychiatric: She has a normal mood and affect. Her behavior is normal. Judgment and thought content normal.  R ankle and dorsal mid-foot is with/o tenderness L hip is tender w/decr ROM      Lab Results  Component Value Date   WBC 16.4* 08/07/2008   HGB 15.6* 08/07/2008   HCT 46.0 08/07/2008   PLT 334 08/07/2008   GLUCOSE 120* 07/02/2014   CHOL 197 03/22/2014   TRIG 170.0* 03/22/2014   HDL 36.30* 03/22/2014   LDLDIRECT 159.8 10/05/2013   LDLCALC 127* 03/22/2014   ALT 47* 03/22/2014   AST 36 03/22/2014   NA 142 07/02/2014   K 4.0 07/02/2014   CL 108 07/02/2014   CREATININE 0.9 07/02/2014   BUN 16 07/02/2014   CO2 21 07/02/2014   TSH 1.42 05/10/2013   INR 1.0 04/19/2007   HGBA1C 6.0 10/05/2013           Assessment & Plan:

## 2014-11-13 NOTE — Assessment & Plan Note (Signed)
Resolved

## 2014-11-13 NOTE — Assessment & Plan Note (Signed)
On Lotensin 

## 2014-11-13 NOTE — Progress Notes (Signed)
Pre visit review using our clinic review tool, if applicable. No additional management support is needed unless otherwise documented below in the visit note. 

## 2014-11-14 ENCOUNTER — Other Ambulatory Visit (INDEPENDENT_AMBULATORY_CARE_PROVIDER_SITE_OTHER): Payer: Commercial Managed Care - HMO

## 2014-11-14 DIAGNOSIS — E119 Type 2 diabetes mellitus without complications: Secondary | ICD-10-CM

## 2014-11-14 DIAGNOSIS — M150 Primary generalized (osteo)arthritis: Secondary | ICD-10-CM | POA: Diagnosis not present

## 2014-11-14 DIAGNOSIS — I10 Essential (primary) hypertension: Secondary | ICD-10-CM | POA: Diagnosis not present

## 2014-11-14 DIAGNOSIS — M159 Polyosteoarthritis, unspecified: Secondary | ICD-10-CM

## 2014-11-14 LAB — TSH: TSH: 1.71 u[IU]/mL (ref 0.35–4.50)

## 2014-11-14 LAB — BASIC METABOLIC PANEL
BUN: 15 mg/dL (ref 6–23)
CO2: 26 meq/L (ref 19–32)
CREATININE: 0.91 mg/dL (ref 0.40–1.20)
Calcium: 9.7 mg/dL (ref 8.4–10.5)
Chloride: 105 mEq/L (ref 96–112)
GFR: 79.07 mL/min (ref >60.00)
GLUCOSE: 108 mg/dL — AB (ref 70–99)
Potassium: 4 mEq/L (ref 3.5–5.1)
Sodium: 137 mEq/L (ref 135–145)

## 2014-11-14 LAB — LIPID PANEL
Cholesterol: 213 mg/dL — ABNORMAL HIGH (ref 0–200)
HDL: 38.5 mg/dL — ABNORMAL LOW (ref >39.00)
LDL Cholesterol: 137 mg/dL — ABNORMAL HIGH (ref 0–99)
NONHDL: 174.5
Total CHOL/HDL Ratio: 6
Triglycerides: 190 mg/dL — ABNORMAL HIGH (ref 0.0–149.0)
VLDL: 38 mg/dL (ref 0.0–40.0)

## 2014-11-14 LAB — HEPATIC FUNCTION PANEL
ALK PHOS: 119 U/L — AB (ref 39–117)
ALT: 40 U/L — ABNORMAL HIGH (ref 0–35)
AST: 25 U/L (ref 0–37)
Albumin: 4.1 g/dL (ref 3.5–5.2)
BILIRUBIN DIRECT: 0.1 mg/dL (ref 0.0–0.3)
BILIRUBIN TOTAL: 0.5 mg/dL (ref 0.2–1.2)
TOTAL PROTEIN: 7.3 g/dL (ref 6.0–8.3)

## 2014-11-14 LAB — HEMOGLOBIN A1C: Hgb A1c MFr Bld: 6.2 % (ref 4.6–6.5)

## 2014-11-18 ENCOUNTER — Encounter: Payer: Self-pay | Admitting: Gastroenterology

## 2014-11-22 ENCOUNTER — Encounter: Payer: Self-pay | Admitting: Gastroenterology

## 2014-12-30 ENCOUNTER — Other Ambulatory Visit: Payer: Self-pay | Admitting: Obstetrics & Gynecology

## 2014-12-30 DIAGNOSIS — Z124 Encounter for screening for malignant neoplasm of cervix: Secondary | ICD-10-CM | POA: Diagnosis not present

## 2014-12-30 DIAGNOSIS — Z01419 Encounter for gynecological examination (general) (routine) without abnormal findings: Secondary | ICD-10-CM | POA: Diagnosis not present

## 2014-12-30 DIAGNOSIS — Z779 Other contact with and (suspected) exposures hazardous to health: Secondary | ICD-10-CM | POA: Diagnosis not present

## 2014-12-30 DIAGNOSIS — Z6830 Body mass index (BMI) 30.0-30.9, adult: Secondary | ICD-10-CM | POA: Diagnosis not present

## 2014-12-31 LAB — CYTOLOGY - PAP

## 2015-01-03 ENCOUNTER — Ambulatory Visit (AMBULATORY_SURGERY_CENTER): Payer: Self-pay | Admitting: *Deleted

## 2015-01-03 VITALS — Ht 61.0 in | Wt 166.6 lb

## 2015-01-03 DIAGNOSIS — Z86010 Personal history of colonic polyps: Secondary | ICD-10-CM

## 2015-01-03 MED ORDER — NA SULFATE-K SULFATE-MG SULF 17.5-3.13-1.6 GM/177ML PO SOLN: 1.0000 | Freq: Once | ORAL | Status: DC

## 2015-01-03 NOTE — Progress Notes (Signed)
No egg or soy allergy--lactose intolerant--becomes gassy No issues with past sedation No diet pills emmi video declined

## 2015-01-16 ENCOUNTER — Ambulatory Visit (AMBULATORY_SURGERY_CENTER): Payer: Commercial Managed Care - HMO | Admitting: Gastroenterology

## 2015-01-16 ENCOUNTER — Encounter: Payer: Self-pay | Admitting: Gastroenterology

## 2015-01-16 VITALS — BP 104/66 | HR 84 | Temp 96.6°F | Resp 16 | Ht 61.0 in | Wt 166.0 lb

## 2015-01-16 DIAGNOSIS — K635 Polyp of colon: Secondary | ICD-10-CM

## 2015-01-16 DIAGNOSIS — Z86010 Personal history of colonic polyps: Secondary | ICD-10-CM | POA: Diagnosis present

## 2015-01-16 DIAGNOSIS — I10 Essential (primary) hypertension: Secondary | ICD-10-CM | POA: Diagnosis not present

## 2015-01-16 DIAGNOSIS — D125 Benign neoplasm of sigmoid colon: Secondary | ICD-10-CM | POA: Diagnosis not present

## 2015-01-16 DIAGNOSIS — D124 Benign neoplasm of descending colon: Secondary | ICD-10-CM | POA: Diagnosis not present

## 2015-01-16 MED ORDER — SODIUM CHLORIDE 0.9 % IV SOLN: 500.0000 mL | INTRAVENOUS | Status: DC

## 2015-01-16 NOTE — Progress Notes (Signed)
Pt says she called and spoke to a nurse Monday about having a cold. Nurse told her it would be fine to have her procedure & ok to continue her antibiotic,but pt said she stopped her antibiotic anyway last Sat. May 28th.

## 2015-01-16 NOTE — Progress Notes (Signed)
Called to room to assist during endoscopic procedure.  Patient ID and intended procedure confirmed with present staff. Received instructions for my participation in the procedure from the performing physician.  

## 2015-01-16 NOTE — Progress Notes (Signed)
Report to PACU, RN, vss, BBS= Clear.  

## 2015-01-16 NOTE — Op Note (Signed)
[place]  [address] [place], [address]   COLONOSCOPY PROCEDURE REPORT  PATIENT: Monica Horn, Monica Horn  MR#: [id number] BIRTHDATE: [birth year] , 69  yrs. old GENDER: female ENDOSCOPIST: Ladene Artist, MD, [facility] PROCEDURE DATE:  01/16/2015 PROCEDURE:   Colonoscopy, surveillance and Colonoscopy with snare polypectomy First Screening Colonoscopy - Avg.  risk and is 50 yrs.  old or older - No.  Prior Negative Screening - Now for repeat screening. N/A  History of Adenoma - Now for follow-up colonoscopy & has been > or = to 3 yrs.  Yes hx of adenoma.  Has been 3 or more years since last colonoscopy.  Polyps removed today? Yes ASA CLASS:   Class II INDICATIONS:Surveillance due to prior colonic neoplasia and PH Colon Adenoma. MEDICATIONS: Monitored anesthesia care and Propofol 240 mg IV DESCRIPTION OF PROCEDURE:   After the risks benefits and alternatives of the procedure were thoroughly explained, informed consent was obtained.  The digital rectal exam revealed no abnormalities of the rectum.   The LB [id number]-[id number]  endoscope was introduced through the anus and advanced to the cecum, which was identified by both the appendix and ileocecal valve. No adverse events experienced with a tortuous colon.   The quality of the prep was excellent.  (Suprep was used)  The instrument was then slowly withdrawn as the colon was fully examined. Estimated blood loss is zero unless otherwise noted in this procedure report.    COLON FINDINGS: Five sessile polyps ranging between 4-63mm in size were found in the sigmoid colon and descending colon. Polypectomies were performed with a cold snare.  The resection was complete, the polyp tissue was completely retrieved and sent to histology.   Tattoo and subtle polypectomy scar at hepatic flexure. The examination was otherwise normal.  Retroflexed views revealed internal Grade I hemorrhoids. The time to cecum = 4.1 Withdrawal time = 12.8    The scope was withdrawn and the procedure completed. COMPLICATIONS: There were no immediate complications.  ENDOSCOPIC IMPRESSION: 1.   Five sessile polyps in the sigmoid colon and descending colon; polypectomies performed with a cold snare 2.   Tattoo and subtle polypectomy scar at hepatic flexure 3.   Grade l internal hemorrhoids  RECOMMENDATIONS: 1.  Await pathology results 2.  Repeat colonoscopy in 3 years if 3 or more polyps adenomatous; otherwise 5 years  eSigned:  Ladene Artist, MD, [facility] 01/16/2015 10:29 AM

## 2015-01-16 NOTE — Patient Instructions (Signed)
YOU HAD AN ENDOSCOPIC PROCEDURE TODAY AT THE [facility]:   Refer to the procedure report that was given to you for any specific questions about what was found during the examination.  If the procedure report does not answer your questions, please call your gastroenterologist to clarify.  If you requested that your care partner not be given the details of your procedure findings, then the procedure report has been included in a sealed envelope for you to review at your convenience later.  YOU SHOULD EXPECT: Some feelings of bloating in the abdomen. Passage of more gas than usual.  Walking can help get rid of the air that was put into your GI tract during the procedure and reduce the bloating. If you had a lower endoscopy (such as a colonoscopy or flexible sigmoidoscopy) you may notice spotting of blood in your stool or on the toilet paper. If you underwent a bowel prep for your procedure, you may not have a normal bowel movement for a few days.  Please Note:  You might notice some irritation and congestion in your nose or some drainage.  This is from the oxygen used during your procedure.  There is no need for concern and it should clear up in a day or so.  SYMPTOMS TO REPORT IMMEDIATELY:   Following lower endoscopy (colonoscopy or flexible sigmoidoscopy):  Excessive amounts of blood in the stool  Significant tenderness or worsening of abdominal pains  Swelling of the abdomen that is new, acute  Fever of 100F or higher   For urgent or emergent issues, a gastroenterologist can be reached at any hour by calling [phone].   DIET: Your first meal following the procedure should be a small meal and then it is ok to progress to your normal diet. Heavy or fried foods are harder to digest and may make you feel nauseous or bloated.  Likewise, meals heavy in dairy and vegetables can increase bloating.  Drink plenty of fluids but you should avoid alcoholic beverages for 24  hours.  ACTIVITY:  You should plan to take it easy for the rest of today and you should NOT DRIVE or use heavy machinery until tomorrow (because of the sedation medicines used during the test).    FOLLOW UP: Our staff will call the number listed on your records the next business day following your procedure to check on you and address any questions or concerns that you may have regarding the information given to you following your procedure. If we do not reach you, we will leave a message.  However, if you are feeling well and you are not experiencing any problems, there is no need to return our call.  We will assume that you have returned to your regular daily activities without incident.  If any biopsies were taken you will be contacted by phone or by letter within the next 1-3 weeks.  Please call us at [phone] if you have not heard about the biopsies in 3 weeks.    SIGNATURES/CONFIDENTIALITY: You and/or your care partner have signed paperwork which will be entered into your electronic medical record.  These signatures attest to the fact that that the information above on your After Visit Summary has been reviewed and is understood.  Full responsibility of the confidentiality of this discharge information lies with you and/or your care-partner. 

## 2015-01-17 ENCOUNTER — Telehealth: Payer: Self-pay | Admitting: *Deleted

## 2015-01-17 NOTE — Telephone Encounter (Signed)
  Follow up Call-  Call back number 01/16/2015  Post procedure Call Back phone  # [phone]  Permission to leave phone message Yes     Patient questions:  Do you have a fever, pain , or abdominal swelling? No. Pain Score  0 *  Have you tolerated food without any problems? Yes.    Have you been able to return to your normal activities? Yes.    Do you have any questions about your discharge instructions: Diet   No. Medications  No. Follow up visit  No.  Do you have questions or concerns about your Care? No.  Actions: * If pain score is 4 or above: No action needed, pain <4.

## 2015-01-20 ENCOUNTER — Encounter: Payer: Self-pay | Admitting: Internal Medicine

## 2015-01-20 ENCOUNTER — Ambulatory Visit (INDEPENDENT_AMBULATORY_CARE_PROVIDER_SITE_OTHER): Payer: Commercial Managed Care - HMO | Admitting: Internal Medicine

## 2015-01-20 VITALS — BP 126/80 | HR 94

## 2015-01-20 DIAGNOSIS — J069 Acute upper respiratory infection, unspecified: Secondary | ICD-10-CM

## 2015-01-20 DIAGNOSIS — I10 Essential (primary) hypertension: Secondary | ICD-10-CM | POA: Diagnosis not present

## 2015-01-20 DIAGNOSIS — R05 Cough: Secondary | ICD-10-CM | POA: Diagnosis not present

## 2015-01-20 DIAGNOSIS — R059 Cough, unspecified: Secondary | ICD-10-CM

## 2015-01-20 MED ORDER — LOSARTAN POTASSIUM 100 MG PO TABS: 100.0000 mg | ORAL_TABLET | Freq: Every day | ORAL | Status: DC

## 2015-01-20 MED ORDER — PROMETHAZINE-CODEINE 6.25-10 MG/5ML PO SYRP: 5.0000 mL | ORAL_SOLUTION | ORAL | Status: DC | PRN

## 2015-01-20 MED ORDER — LEVOFLOXACIN 500 MG PO TABS: 500.0000 mg | ORAL_TABLET | Freq: Every day | ORAL | Status: DC

## 2015-01-20 NOTE — Assessment & Plan Note (Signed)
Chronic D/c Benazepril due to cough 6/16 Start Losartan Prom-cod

## 2015-01-20 NOTE — Assessment & Plan Note (Addendum)
Levaquin x 7 d Prom cod D/c Benazepril

## 2015-01-20 NOTE — Assessment & Plan Note (Signed)
Chronic D/c Benazepril due to cough 6/16 Start Losartan

## 2015-01-20 NOTE — Progress Notes (Signed)
   Subjective:     HPI  C/o URI x 2-3 wks F/u URI sx's - took Amoxicillin  F/u GERD F/u stress, grief - better F/u HTN and dyslipidemia, blood in stool off and on last month; R shoulder pain - better F/u OA w/aches - better  Wt Readings from Last 3 Encounters:  01/16/15 166 lb (75.297 kg)  01/03/15 166 lb 9.6 oz (75.569 kg)  11/13/14 163 lb (73.936 kg)    BP Readings from Last 3 Encounters:  01/20/15 126/80  01/16/15 104/66  11/13/14 132/85     Review of Systems  Constitutional: Negative for activity change, appetite change and unexpected weight change.  HENT: Negative for mouth sores.   Eyes: Negative for visual disturbance.  Respiratory: Negative for chest tightness.   Genitourinary: Negative for difficulty urinating and vaginal pain.  Musculoskeletal: Positive for arthralgias. Negative for back pain and gait problem.  Skin: Negative for pallor.  Neurological: Positive for light-headedness (w/spells) and numbness (R hand chronic). Negative for dizziness, tremors and weakness.  Psychiatric/Behavioral: Negative for confusion and sleep disturbance.       Objective:   Physical Exam  Constitutional: She appears well-developed. No distress.  HENT:  Head: Normocephalic.  Right Ear: External ear normal.  Left Ear: External ear normal.  Nose: Nose normal.  Mouth/Throat: Oropharynx is clear and moist.  Eyes: Conjunctivae are normal. Pupils are equal, round, and reactive to light. Right eye exhibits no discharge. Left eye exhibits no discharge.  Neck: Normal range of motion. Neck supple. No JVD present. No tracheal deviation present. No thyromegaly present.  Cardiovascular: Normal rate, regular rhythm and normal heart sounds.   Pulmonary/Chest: No stridor. No respiratory distress. She has no wheezes.  Abdominal: Soft. Bowel sounds are normal. She exhibits no distension and no mass. There is no tenderness. There is no rebound and no guarding.  Musculoskeletal: She exhibits  no edema or tenderness.  Lymphadenopathy:    She has no cervical adenopathy.  Neurological: She displays normal reflexes. No cranial nerve deficit. She exhibits normal muscle tone. Coordination normal.  Skin: No rash noted. No erythema.  Psychiatric: She has a normal mood and affect. Her behavior is normal. Judgment and thought content normal.  R ankle and dorsal mid-foot is with/o tenderness L hip is tender w/decr ROM      Lab Results  Component Value Date   WBC 16.4* 08/07/2008   HGB 15.6* 08/07/2008   HCT 46.0 08/07/2008   PLT 334 08/07/2008   GLUCOSE 108* 11/14/2014   CHOL 213* 11/14/2014   TRIG 190.0* 11/14/2014   HDL 38.50* 11/14/2014   LDLDIRECT 159.8 10/05/2013   LDLCALC 137* 11/14/2014   ALT 40* 11/14/2014   AST 25 11/14/2014   NA 137 11/14/2014   K 4.0 11/14/2014   CL 105 11/14/2014   CREATININE 0.91 11/14/2014   BUN 15 11/14/2014   CO2 26 11/14/2014   TSH 1.71 11/14/2014   INR 1.0 04/19/2007   HGBA1C 6.2 11/14/2014           Assessment & Plan:

## 2015-01-20 NOTE — Progress Notes (Signed)
Pre visit review using our clinic review tool, if applicable. No additional management support is needed unless otherwise documented below in the visit note. 

## 2015-01-24 ENCOUNTER — Encounter: Payer: Self-pay | Admitting: Gastroenterology

## 2015-02-05 ENCOUNTER — Telehealth: Payer: Self-pay | Admitting: Gastroenterology

## 2015-02-05 NOTE — Telephone Encounter (Signed)
I reviewed the letter and all questions answered about pathology.  She will call back for any additional questions or concerns

## 2015-02-25 ENCOUNTER — Other Ambulatory Visit: Payer: Self-pay | Admitting: Internal Medicine

## 2015-03-21 ENCOUNTER — Ambulatory Visit: Payer: Commercial Managed Care - HMO | Admitting: Internal Medicine

## 2015-03-31 ENCOUNTER — Ambulatory Visit: Payer: Commercial Managed Care - HMO | Admitting: Internal Medicine

## 2015-04-04 ENCOUNTER — Encounter: Payer: Self-pay | Admitting: Internal Medicine

## 2015-04-04 ENCOUNTER — Ambulatory Visit (INDEPENDENT_AMBULATORY_CARE_PROVIDER_SITE_OTHER): Payer: Commercial Managed Care - HMO | Admitting: Internal Medicine

## 2015-04-04 VITALS — BP 120/70 | HR 99 | Wt 166.0 lb

## 2015-04-04 DIAGNOSIS — E119 Type 2 diabetes mellitus without complications: Secondary | ICD-10-CM

## 2015-04-04 DIAGNOSIS — I10 Essential (primary) hypertension: Secondary | ICD-10-CM | POA: Diagnosis not present

## 2015-04-04 DIAGNOSIS — E7800 Pure hypercholesterolemia, unspecified: Secondary | ICD-10-CM

## 2015-04-04 MED ORDER — AMLODIPINE BESYLATE 5 MG PO TABS: 5.0000 mg | ORAL_TABLET | Freq: Every day | ORAL | Status: DC

## 2015-04-04 NOTE — Assessment & Plan Note (Addendum)
D/c Benazepril due to cough 6/16 - restarted - pls d/c Stopped Losartan due to HA 8/16 start Norvasc

## 2015-04-04 NOTE — Assessment & Plan Note (Signed)
  On diet  

## 2015-04-04 NOTE — Assessment & Plan Note (Signed)
On diet Labs 

## 2015-04-04 NOTE — Progress Notes (Signed)
Subjective:  Patient ID: Monica Horn, female    DOB: August 06, 1946  Age: 69 y.o. MRN: [id number]  CC: No chief complaint on file.   HPI Monica Horn presents for HTN, GERD, DM2 on diet  C/o HA on Losartan - pt re-started Benazepril   Outpatient Prescriptions Prior to Visit  Medication Sig Dispense Refill  . aspirin 81 MG tablet     . cetirizine (ZYRTEC) 10 MG tablet Take 1 tablet (10 mg total) by mouth daily as needed. 90 tablet 3  . Cholecalciferol (VITAMIN D3) 1000 UNITS tablet Take 1,000 Units by mouth daily.      Marland Kitchen ibuprofen (ADVIL,MOTRIN) 400 MG tablet TAKE ONE TABLET BY MOUTH EVERY 8 HOURS AS NEEDED FOR PAIN 60 tablet 0  . pantoprazole (PROTONIX) 40 MG tablet TAKE 1 TABLET EVERY DAY 90 tablet 3  . levofloxacin (LEVAQUIN) 500 MG tablet Take 1 tablet (500 mg total) by mouth daily. 7 tablet 0  . losartan (COZAAR) 100 MG tablet Take 1 tablet (100 mg total) by mouth daily. (Patient not taking: Reported on 04/04/2015) 90 tablet 3  . Na Sulfate-K Sulfate-Mg Sulf (SUPREP BOWEL PREP) SOLN Take 1 kit by mouth once. suprep as directed. No substitutions (Patient not taking: Reported on 04/04/2015) 354 mL 0  . promethazine-codeine (PHENERGAN WITH CODEINE) 6.25-10 MG/5ML syrup Take 5 mLs by mouth every 4 (four) hours as needed. (Patient not taking: Reported on 04/04/2015) 300 mL 0   No facility-administered medications prior to visit.    ROS Review of Systems  Objective:  BP 120/70 mmHg  Pulse 99  Wt 166 lb (75.297 kg)  SpO2 96%  BP Readings from Last 3 Encounters:  04/04/15 120/70  01/20/15 126/80  01/16/15 104/66    Wt Readings from Last 3 Encounters:  04/04/15 166 lb (75.297 kg)  01/16/15 166 lb (75.297 kg)  01/03/15 166 lb 9.6 oz (75.569 kg)    Physical Exam  Lab Results  Component Value Date   WBC 16.4* 08/07/2008   HGB 15.6* 08/07/2008   HCT 46.0 08/07/2008   PLT 334 08/07/2008   GLUCOSE 108* 11/14/2014   CHOL 213* 11/14/2014   TRIG 190.0* 11/14/2014   HDL  38.50* 11/14/2014   LDLDIRECT 159.8 10/05/2013   LDLCALC 137* 11/14/2014   ALT 40* 11/14/2014   AST 25 11/14/2014   NA 137 11/14/2014   K 4.0 11/14/2014   CL 105 11/14/2014   CREATININE 0.91 11/14/2014   BUN 15 11/14/2014   CO2 26 11/14/2014   TSH 1.71 11/14/2014   INR 1.0 04/19/2007   HGBA1C 6.2 11/14/2014    Mm Digital Screening Bilateral  02/08/2014   CLINICAL DATA:  Screening.  EXAM: DIGITAL SCREENING BILATERAL MAMMOGRAM WITH CAD  COMPARISON:  Previous exam(s).  ACR Breast Density Category b: There are scattered areas of fibroglandular density.  FINDINGS: There are no findings suspicious for malignancy. Images were processed with CAD.  IMPRESSION: No mammographic evidence of malignancy. A result letter of this screening mammogram will be mailed directly to the patient.  RECOMMENDATION: Screening mammogram in one year. (Code:SM-B-01Y)  BI-RADS CATEGORY  1: Negative.   Electronically Signed   By: Shon Hale M.D.   On: 02/08/2014 14:24    Assessment & Plan:   There are no diagnoses linked to this encounter. I have discontinued [name]'s levofloxacin. I am also having her maintain her cholecalciferol, cetirizine, ibuprofen, aspirin, Na Sulfate-K Sulfate-Mg Sulf, promethazine-codeine, losartan, pantoprazole, Multiple Vitamins-Minerals (WOMENS MULTIVITAMIN PLUS PO), and benazepril.  Meds ordered this encounter  Medications  . Multiple Vitamins-Minerals (WOMENS MULTIVITAMIN PLUS PO)    Sig: daily.  . benazepril (LOTENSIN) 10 MG tablet    Sig: Take 10 mg by mouth daily.     Follow-up: No Follow-up on file.  Walker Kehr, MD

## 2015-04-04 NOTE — Progress Notes (Signed)
Pre visit review using our clinic review tool, if applicable. No additional management support is needed unless otherwise documented below in the visit note. 

## 2015-04-11 ENCOUNTER — Other Ambulatory Visit: Payer: Self-pay | Admitting: Internal Medicine

## 2015-05-13 ENCOUNTER — Other Ambulatory Visit (INDEPENDENT_AMBULATORY_CARE_PROVIDER_SITE_OTHER): Payer: Commercial Managed Care - HMO

## 2015-05-13 DIAGNOSIS — I10 Essential (primary) hypertension: Secondary | ICD-10-CM | POA: Diagnosis not present

## 2015-05-13 DIAGNOSIS — E119 Type 2 diabetes mellitus without complications: Secondary | ICD-10-CM

## 2015-05-13 DIAGNOSIS — E7800 Pure hypercholesterolemia, unspecified: Secondary | ICD-10-CM

## 2015-05-13 LAB — LIPID PANEL
CHOLESTEROL: 205 mg/dL — AB (ref 0–200)
HDL: 35.9 mg/dL — AB (ref >39.00)
NonHDL: 168.68
TRIGLYCERIDES: 367 mg/dL — AB (ref 0.0–149.0)
Total CHOL/HDL Ratio: 6
VLDL: 73.4 mg/dL — AB (ref 0.0–40.0)

## 2015-05-13 LAB — BASIC METABOLIC PANEL
BUN: 17 mg/dL (ref 6–23)
CO2: 27 meq/L (ref 19–32)
Calcium: 9.5 mg/dL (ref 8.4–10.5)
Chloride: 107 mEq/L (ref 96–112)
Creatinine, Ser: 0.8 mg/dL (ref 0.40–1.20)
GFR: 91.61 mL/min (ref >60.00)
GLUCOSE: 91 mg/dL (ref 70–99)
Potassium: 3.7 mEq/L (ref 3.5–5.1)
Sodium: 139 mEq/L (ref 135–145)

## 2015-05-13 LAB — HEMOGLOBIN A1C: HEMOGLOBIN A1C: 6.1 % (ref 4.6–6.5)

## 2015-05-13 LAB — LDL CHOLESTEROL, DIRECT: Direct LDL: 120 mg/dL

## 2015-06-27 ENCOUNTER — Ambulatory Visit (INDEPENDENT_AMBULATORY_CARE_PROVIDER_SITE_OTHER): Payer: Commercial Managed Care - HMO | Admitting: Internal Medicine

## 2015-06-27 ENCOUNTER — Encounter: Payer: Self-pay | Admitting: Internal Medicine

## 2015-06-27 VITALS — BP 118/70 | HR 96 | Wt 164.0 lb

## 2015-06-27 DIAGNOSIS — Z23 Encounter for immunization: Secondary | ICD-10-CM

## 2015-06-27 DIAGNOSIS — E119 Type 2 diabetes mellitus without complications: Secondary | ICD-10-CM

## 2015-06-27 DIAGNOSIS — I10 Essential (primary) hypertension: Secondary | ICD-10-CM

## 2015-06-27 MED ORDER — PANTOPRAZOLE SODIUM 40 MG PO TBEC: 40.0000 mg | DELAYED_RELEASE_TABLET | Freq: Every day | ORAL | Status: DC

## 2015-06-27 MED ORDER — AMLODIPINE BESYLATE 5 MG PO TABS: 5.0000 mg | ORAL_TABLET | Freq: Every day | ORAL | Status: DC

## 2015-06-27 NOTE — Progress Notes (Signed)
Subjective:  Patient ID: Monica Horn, female    DOB: [birth year]  Age: 69 y.o. MRN: [id number]  CC: No chief complaint on file.   HPI Monica Horn presents for HTN, DM, GERD f/u  Outpatient Prescriptions Prior to Visit  Medication Sig Dispense Refill  . aspirin 81 MG tablet     . cetirizine (ZYRTEC) 10 MG tablet Take 1 tablet (10 mg total) by mouth daily as needed. 90 tablet 3  . Cholecalciferol (VITAMIN D3) 1000 UNITS tablet Take 1,000 Units by mouth daily.      Marland Kitchen ibuprofen (ADVIL,MOTRIN) 400 MG tablet TAKE ONE TABLET BY MOUTH EVERY 8 HOURS AS NEEDED FOR PAIN 60 tablet 2  . Multiple Vitamins-Minerals (WOMENS MULTIVITAMIN PLUS PO) daily.    . Na Sulfate-K Sulfate-Mg Sulf (SUPREP BOWEL PREP) SOLN Take 1 kit by mouth once. suprep as directed. No substitutions 354 mL 0  . amLODipine (NORVASC) 5 MG tablet Take 1 tablet (5 mg total) by mouth daily. 30 tablet 11  . pantoprazole (PROTONIX) 40 MG tablet TAKE 1 TABLET EVERY DAY 90 tablet 3   No facility-administered medications prior to visit.    ROS Review of Systems  Constitutional: Negative for chills, activity change, appetite change, fatigue and unexpected weight change.  HENT: Negative for congestion, mouth sores and sinus pressure.   Eyes: Negative for visual disturbance.  Respiratory: Negative for cough and chest tightness.   Gastrointestinal: Negative for nausea and abdominal pain.  Genitourinary: Negative for frequency, difficulty urinating and vaginal pain.  Musculoskeletal: Negative for back pain and gait problem.  Skin: Negative for pallor and rash.  Neurological: Negative for dizziness, tremors, weakness, numbness and headaches.  Psychiatric/Behavioral: Negative for confusion and sleep disturbance.    Objective:  BP 118/70 mmHg  Pulse 96  Wt 164 lb (74.39 kg)  SpO2 96%  BP Readings from Last 3 Encounters:  06/27/15 118/70  04/04/15 120/70  01/20/15 126/80    Wt Readings from Last 3 Encounters:  06/27/15  164 lb (74.39 kg)  04/04/15 166 lb (75.297 kg)  01/16/15 166 lb (75.297 kg)    Physical Exam  Constitutional: She appears well-developed. No distress.  HENT:  Head: Normocephalic.  Right Ear: External ear normal.  Left Ear: External ear normal.  Nose: Nose normal.  Mouth/Throat: Oropharynx is clear and moist.  Eyes: Conjunctivae are normal. Pupils are equal, round, and reactive to light. Right eye exhibits no discharge. Left eye exhibits no discharge.  Neck: Normal range of motion. Neck supple. No JVD present. No tracheal deviation present. No thyromegaly present.  Cardiovascular: Normal rate, regular rhythm and normal heart sounds.   Pulmonary/Chest: No stridor. No respiratory distress. She has no wheezes.  Abdominal: Soft. Bowel sounds are normal. She exhibits no distension and no mass. There is no tenderness. There is no rebound and no guarding.  Musculoskeletal: She exhibits no edema or tenderness.  Lymphadenopathy:    She has no cervical adenopathy.  Neurological: She displays normal reflexes. No cranial nerve deficit. She exhibits normal muscle tone. Coordination normal.  Skin: No rash noted. No erythema.  Psychiatric: She has a normal mood and affect. Her behavior is normal. Judgment and thought content normal.    Lab Results  Component Value Date   WBC 16.4* 08/07/2008   HGB 15.6* 08/07/2008   HCT 46.0 08/07/2008   PLT 334 08/07/2008   GLUCOSE 91 05/13/2015   CHOL 205* 05/13/2015   TRIG 367.0* 05/13/2015   HDL 35.90* 05/13/2015   LDLDIRECT  120.0 05/13/2015   LDLCALC 137* 11/14/2014   ALT 40* 11/14/2014   AST 25 11/14/2014   NA 139 05/13/2015   K 3.7 05/13/2015   CL 107 05/13/2015   CREATININE 0.80 05/13/2015   BUN 17 05/13/2015   CO2 27 05/13/2015   TSH 1.71 11/14/2014   INR 1.0 04/19/2007   HGBA1C 6.1 05/13/2015    Mm Digital Screening Bilateral  02/08/2014  CLINICAL DATA:  Screening. EXAM: DIGITAL SCREENING BILATERAL MAMMOGRAM WITH CAD COMPARISON:   Previous exam(s). ACR Breast Density Category b: There are scattered areas of fibroglandular density. FINDINGS: There are no findings suspicious for malignancy. Images were processed with CAD. IMPRESSION: No mammographic evidence of malignancy. A result letter of this screening mammogram will be mailed directly to the patient. RECOMMENDATION: Screening mammogram in one year. (Code:SM-B-01Y) BI-RADS CATEGORY  1: Negative. Electronically Signed   By: Shon Hale M.D.   On: 02/08/2014 14:24    Assessment & Plan:   Diagnoses and all orders for this visit:  Essential hypertension  Controlled type 2 diabetes mellitus without complication, without long-term current use of insulin (HCC)  Need for influenza vaccination -     Flu Vaccine QUAD 36+ mos IM  Need for prophylactic vaccination against Streptococcus pneumoniae (pneumococcus) -     Pneumococcal polysaccharide vaccine 23-valent greater than or equal to 2yo subcutaneous/IM  Other orders -     amLODipine (NORVASC) 5 MG tablet; Take 1 tablet (5 mg total) by mouth daily. -     pantoprazole (PROTONIX) 40 MG tablet; Take 1 tablet (40 mg total) by mouth daily.  I have changed Monica Horn's pantoprazole. I am also having her maintain her cholecalciferol, cetirizine, aspirin, Na Sulfate-K Sulfate-Mg Sulf, Multiple Vitamins-Minerals (WOMENS MULTIVITAMIN PLUS PO), ibuprofen, and amLODipine.  Meds ordered this encounter  Medications  . amLODipine (NORVASC) 5 MG tablet    Sig: Take 1 tablet (5 mg total) by mouth daily.    Dispense:  30 tablet    Refill:  11  . pantoprazole (PROTONIX) 40 MG tablet    Sig: Take 1 tablet (40 mg total) by mouth daily.    Dispense:  30 tablet    Refill:  11     Follow-up: Return in about 3 months (around 09/27/2015) for a follow-up visit.  Walker Kehr, MD

## 2015-06-27 NOTE — Assessment & Plan Note (Signed)
Labs are good.

## 2015-06-27 NOTE — Progress Notes (Signed)
Pre visit review using our clinic review tool, if applicable. No additional management support is needed unless otherwise documented below in the visit note. 

## 2015-06-27 NOTE — Assessment & Plan Note (Signed)
D/c Benazepril due to cough 6/16 - restarted 8/16 Stopped Losartan due to HA

## 2015-06-30 DIAGNOSIS — H524 Presbyopia: Secondary | ICD-10-CM | POA: Diagnosis not present

## 2015-06-30 DIAGNOSIS — H2513 Age-related nuclear cataract, bilateral: Secondary | ICD-10-CM | POA: Diagnosis not present

## 2015-10-02 ENCOUNTER — Encounter: Payer: Self-pay | Admitting: Internal Medicine

## 2015-10-02 ENCOUNTER — Ambulatory Visit (INDEPENDENT_AMBULATORY_CARE_PROVIDER_SITE_OTHER): Payer: Commercial Managed Care - HMO | Admitting: Internal Medicine

## 2015-10-02 VITALS — BP 118/80 | HR 97 | Wt 165.0 lb

## 2015-10-02 DIAGNOSIS — E119 Type 2 diabetes mellitus without complications: Secondary | ICD-10-CM | POA: Diagnosis not present

## 2015-10-02 DIAGNOSIS — M159 Polyosteoarthritis, unspecified: Secondary | ICD-10-CM

## 2015-10-02 DIAGNOSIS — K219 Gastro-esophageal reflux disease without esophagitis: Secondary | ICD-10-CM

## 2015-10-02 DIAGNOSIS — Z23 Encounter for immunization: Secondary | ICD-10-CM | POA: Diagnosis not present

## 2015-10-02 DIAGNOSIS — I10 Essential (primary) hypertension: Secondary | ICD-10-CM | POA: Diagnosis not present

## 2015-10-02 DIAGNOSIS — M150 Primary generalized (osteo)arthritis: Secondary | ICD-10-CM | POA: Diagnosis not present

## 2015-10-02 NOTE — Assessment & Plan Note (Signed)
On Protonix 

## 2015-10-02 NOTE — Progress Notes (Signed)
Subjective:  Patient ID: Monica Horn, female    DOB: 11/01/1945  Age: 70 y.o. MRN: [id number]  CC: No chief complaint on file.   HPI Neve J Ramaker presents for HTN, GERD, OA f/u  Outpatient Prescriptions Prior to Visit  Medication Sig Dispense Refill  . amLODipine (NORVASC) 5 MG tablet Take 1 tablet (5 mg total) by mouth daily. 30 tablet 11  . aspirin 81 MG tablet     . cetirizine (ZYRTEC) 10 MG tablet Take 1 tablet (10 mg total) by mouth daily as needed. 90 tablet 3  . Cholecalciferol (VITAMIN D3) 1000 UNITS tablet Take 1,000 Units by mouth daily.      Marland Kitchen ibuprofen (ADVIL,MOTRIN) 400 MG tablet TAKE ONE TABLET BY MOUTH EVERY 8 HOURS AS NEEDED FOR PAIN 60 tablet 2  . Multiple Vitamins-Minerals (WOMENS MULTIVITAMIN PLUS PO) daily.    . Na Sulfate-K Sulfate-Mg Sulf (SUPREP BOWEL PREP) SOLN Take 1 kit by mouth once. suprep as directed. No substitutions 354 mL 0  . pantoprazole (PROTONIX) 40 MG tablet Take 1 tablet (40 mg total) by mouth daily. 30 tablet 11   No facility-administered medications prior to visit.    ROS Review of Systems  Constitutional: Negative for chills, activity change, appetite change, fatigue and unexpected weight change.  HENT: Negative for congestion, mouth sores and sinus pressure.   Eyes: Negative for visual disturbance.  Respiratory: Negative for cough and chest tightness.   Gastrointestinal: Negative for nausea and abdominal pain.  Genitourinary: Negative for frequency, difficulty urinating and vaginal pain.  Musculoskeletal: Negative for back pain and gait problem.  Skin: Negative for pallor and rash.  Neurological: Negative for dizziness, tremors, weakness, numbness and headaches.  Psychiatric/Behavioral: Negative for suicidal ideas, confusion and sleep disturbance.    Objective:  BP 118/80 mmHg  Pulse 97  Wt 165 lb (74.844 kg)  SpO2 96%  BP Readings from Last 3 Encounters:  10/02/15 118/80  06/27/15 118/70  04/04/15 120/70    Wt  Readings from Last 3 Encounters:  10/02/15 165 lb (74.844 kg)  06/27/15 164 lb (74.39 kg)  04/04/15 166 lb (75.297 kg)    Physical Exam  Constitutional: She appears well-developed. No distress.  HENT:  Head: Normocephalic.  Right Ear: External ear normal.  Left Ear: External ear normal.  Nose: Nose normal.  Mouth/Throat: Oropharynx is clear and moist.  Eyes: Conjunctivae are normal. Pupils are equal, round, and reactive to light. Right eye exhibits no discharge. Left eye exhibits no discharge.  Neck: Normal range of motion. Neck supple. No JVD present. No tracheal deviation present. No thyromegaly present.  Cardiovascular: Normal rate, regular rhythm and normal heart sounds.   Pulmonary/Chest: No stridor. No respiratory distress. She has no wheezes.  Abdominal: Soft. Bowel sounds are normal. She exhibits no distension and no mass. There is no tenderness. There is no rebound and no guarding.  Musculoskeletal: She exhibits no edema or tenderness.  Lymphadenopathy:    She has no cervical adenopathy.  Neurological: She displays normal reflexes. No cranial nerve deficit. She exhibits normal muscle tone. Coordination normal.  Skin: No rash noted. No erythema.  Psychiatric: She has a normal mood and affect. Her behavior is normal. Judgment and thought content normal.    Lab Results  Component Value Date   WBC 16.4* 08/07/2008   HGB 15.6* 08/07/2008   HCT 46.0 08/07/2008   PLT 334 08/07/2008   GLUCOSE 91 05/13/2015   CHOL 205* 05/13/2015   TRIG 367.0* 05/13/2015  HDL 35.90* 05/13/2015   LDLDIRECT 120.0 05/13/2015   LDLCALC 137* 11/14/2014   ALT 40* 11/14/2014   AST 25 11/14/2014   NA 139 05/13/2015   K 3.7 05/13/2015   CL 107 05/13/2015   CREATININE 0.80 05/13/2015   BUN 17 05/13/2015   CO2 27 05/13/2015   TSH 1.71 11/14/2014   INR 1.0 04/19/2007   HGBA1C 6.1 05/13/2015    Mm Digital Screening Bilateral  02/08/2014  CLINICAL DATA:  Screening. EXAM: DIGITAL SCREENING  BILATERAL MAMMOGRAM WITH CAD COMPARISON:  Previous exam(s). ACR Breast Density Category b: There are scattered areas of fibroglandular density. FINDINGS: There are no findings suspicious for malignancy. Images were processed with CAD. IMPRESSION: No mammographic evidence of malignancy. A result letter of this screening mammogram will be mailed directly to the patient. RECOMMENDATION: Screening mammogram in one year. (Code:SM-B-01Y) BI-RADS CATEGORY  1: Negative. Electronically Signed   By: Shon Hale M.D.   On: 02/08/2014 14:24    Assessment & Plan:   Diagnoses and all orders for this visit:  Essential hypertension  Controlled type 2 diabetes mellitus without complication, without long-term current use of insulin (HCC)  Gastroesophageal reflux disease without esophagitis  Primary osteoarthritis involving multiple joints  Need for prophylactic vaccination against Streptococcus pneumoniae (pneumococcus) -     Pneumococcal conjugate vaccine 13-valent IM  I am having Ms. Liford maintain her cholecalciferol, cetirizine, aspirin, Na Sulfate-K Sulfate-Mg Sulf, Multiple Vitamins-Minerals (WOMENS MULTIVITAMIN PLUS PO), ibuprofen, amLODipine, and pantoprazole.  No orders of the defined types were placed in this encounter.     Follow-up: Return in about 4 months (around 01/30/2016) for a follow-up visit.  Walker Kehr, MD

## 2015-10-02 NOTE — Progress Notes (Signed)
Pre visit review using our clinic review tool, if applicable. No additional management support is needed unless otherwise documented below in the visit note. 

## 2015-10-02 NOTE — Assessment & Plan Note (Signed)
Monitor labs 

## 2015-10-02 NOTE — Assessment & Plan Note (Signed)
Ibuprofen

## 2015-10-02 NOTE — Assessment & Plan Note (Signed)
Chronic - on Norvasc

## 2015-10-03 ENCOUNTER — Ambulatory Visit: Payer: Commercial Managed Care - HMO | Admitting: Internal Medicine

## 2015-11-11 ENCOUNTER — Encounter: Payer: Self-pay | Admitting: Internal Medicine

## 2015-11-11 ENCOUNTER — Ambulatory Visit (INDEPENDENT_AMBULATORY_CARE_PROVIDER_SITE_OTHER): Payer: Commercial Managed Care - HMO | Admitting: Internal Medicine

## 2015-11-11 VITALS — BP 110/70 | HR 104 | Wt 169.0 lb

## 2015-11-11 DIAGNOSIS — M67432 Ganglion, left wrist: Secondary | ICD-10-CM | POA: Diagnosis not present

## 2015-11-11 DIAGNOSIS — M67439 Ganglion, unspecified wrist: Secondary | ICD-10-CM

## 2015-11-11 DIAGNOSIS — J309 Allergic rhinitis, unspecified: Secondary | ICD-10-CM | POA: Insufficient documentation

## 2015-11-11 DIAGNOSIS — M6740 Ganglion, unspecified site: Secondary | ICD-10-CM

## 2015-11-11 DIAGNOSIS — J301 Allergic rhinitis due to pollen: Secondary | ICD-10-CM

## 2015-11-11 HISTORY — DX: Allergic rhinitis, unspecified: J30.9

## 2015-11-11 HISTORY — DX: Ganglion, unspecified site: M67.40

## 2015-11-11 NOTE — Progress Notes (Signed)
Pre visit review using our clinic review tool, if applicable. No additional management support is needed unless otherwise documented below in the visit note. 

## 2015-11-11 NOTE — Assessment & Plan Note (Signed)
Zyrtec qd

## 2015-11-11 NOTE — Assessment & Plan Note (Addendum)
L wrist tender 1 cm ganglion 3/17 Options discussed Ortho ref - Dr Amedeo Plenty

## 2015-11-11 NOTE — Progress Notes (Signed)
   Subjective:  Patient ID: Monica Horn, female    DOB: [birth year]  Age: 70 y.o. MRN: [id number]  CC: No chief complaint on file.   HPI Monica Horn presents for a painful cyst on L wrist  Outpatient Prescriptions Prior to Visit  Medication Sig Dispense Refill  . amLODipine (NORVASC) 5 MG tablet Take 1 tablet (5 mg total) by mouth daily. 30 tablet 11  . aspirin 81 MG tablet     . cetirizine (ZYRTEC) 10 MG tablet Take 1 tablet (10 mg total) by mouth daily as needed. 90 tablet 3  . Cholecalciferol (VITAMIN D3) 1000 UNITS tablet Take 1,000 Units by mouth daily.      Marland Kitchen ibuprofen (ADVIL,MOTRIN) 400 MG tablet TAKE ONE TABLET BY MOUTH EVERY 8 HOURS AS NEEDED FOR PAIN 60 tablet 2  . Multiple Vitamins-Minerals (WOMENS MULTIVITAMIN PLUS PO) daily.    . Na Sulfate-K Sulfate-Mg Sulf (SUPREP BOWEL PREP) SOLN Take 1 kit by mouth once. suprep as directed. No substitutions 354 mL 0  . pantoprazole (PROTONIX) 40 MG tablet Take 1 tablet (40 mg total) by mouth daily. 30 tablet 11   No facility-administered medications prior to visit.    ROS Review of Systems  Constitutional: Negative for fever and fatigue.    Objective:  BP 110/70 mmHg  Pulse 104  Wt 169 lb (76.658 kg)  SpO2 98%  BP Readings from Last 3 Encounters:  11/11/15 110/70  10/02/15 118/80  06/27/15 118/70    Wt Readings from Last 3 Encounters:  11/11/15 169 lb (76.658 kg)  10/02/15 165 lb (74.844 kg)  06/27/15 164 lb (74.39 kg)    Physical Exam  Musculoskeletal: She exhibits tenderness.  Neurological: She exhibits normal muscle tone.  Skin: No rash noted. No erythema.  L wrist tender 1 cm ganglion  Lab Results  Component Value Date   WBC 16.4* 08/07/2008   HGB 15.6* 08/07/2008   HCT 46.0 08/07/2008   PLT 334 08/07/2008   GLUCOSE 91 05/13/2015   CHOL 205* 05/13/2015   TRIG 367.0* 05/13/2015   HDL 35.90* 05/13/2015   LDLDIRECT 120.0 05/13/2015   LDLCALC 137* 11/14/2014   ALT 40* 11/14/2014   AST 25  11/14/2014   NA 139 05/13/2015   K 3.7 05/13/2015   CL 107 05/13/2015   CREATININE 0.80 05/13/2015   BUN 17 05/13/2015   CO2 27 05/13/2015   TSH 1.71 11/14/2014   INR 1.0 04/19/2007   HGBA1C 6.1 05/13/2015    Mm Digital Screening Bilateral  02/08/2014  CLINICAL DATA:  Screening. EXAM: DIGITAL SCREENING BILATERAL MAMMOGRAM WITH CAD COMPARISON:  Previous exam(s). ACR Breast Density Category b: There are scattered areas of fibroglandular density. FINDINGS: There are no findings suspicious for malignancy. Images were processed with CAD. IMPRESSION: No mammographic evidence of malignancy. A result letter of this screening mammogram will be mailed directly to the patient. RECOMMENDATION: Screening mammogram in one year. (Code:SM-B-01Y) BI-RADS CATEGORY  1: Negative. Electronically Signed   By: Shon Hale M.D.   On: 02/08/2014 14:24    Assessment & Plan:   There are no diagnoses linked to this encounter. I am having Ms. Howington maintain her cholecalciferol, cetirizine, aspirin, Na Sulfate-K Sulfate-Mg Sulf, Multiple Vitamins-Minerals (WOMENS MULTIVITAMIN PLUS PO), ibuprofen, amLODipine, and pantoprazole.  No orders of the defined types were placed in this encounter.     Follow-up: No Follow-up on file.  Walker Kehr, MD

## 2015-11-12 ENCOUNTER — Telehealth: Payer: Self-pay | Admitting: Internal Medicine

## 2015-11-12 NOTE — Telephone Encounter (Signed)
Pt would like u to call her , not sure what it about.  She would not say why.  I have a feeling it is something about her daughter .

## 2015-11-12 NOTE — Telephone Encounter (Signed)
I called pt- she was wanting Dr. Alain Marion to work her daughter in today. She states the patient has been seen by Dr. Jenny Reichmann.

## 2015-12-24 ENCOUNTER — Telehealth: Payer: Self-pay | Admitting: Internal Medicine

## 2015-12-24 NOTE — Telephone Encounter (Signed)
Pt stated someone called and asked if she had her diabetes eye exam. She had her eye exam in 11/16 but not diabetic exam.

## 2016-01-14 DIAGNOSIS — M67442 Ganglion, left hand: Secondary | ICD-10-CM | POA: Diagnosis not present

## 2016-01-26 ENCOUNTER — Other Ambulatory Visit: Payer: Self-pay | Admitting: Internal Medicine

## 2016-01-29 ENCOUNTER — Ambulatory Visit: Payer: Commercial Managed Care - HMO | Admitting: Internal Medicine

## 2016-02-06 ENCOUNTER — Ambulatory Visit (INDEPENDENT_AMBULATORY_CARE_PROVIDER_SITE_OTHER): Payer: Commercial Managed Care - HMO | Admitting: Internal Medicine

## 2016-02-06 ENCOUNTER — Encounter: Payer: Self-pay | Admitting: Internal Medicine

## 2016-02-06 ENCOUNTER — Other Ambulatory Visit (INDEPENDENT_AMBULATORY_CARE_PROVIDER_SITE_OTHER): Payer: Commercial Managed Care - HMO

## 2016-02-06 VITALS — BP 112/74 | HR 102 | Wt 170.0 lb

## 2016-02-06 DIAGNOSIS — I10 Essential (primary) hypertension: Secondary | ICD-10-CM

## 2016-02-06 DIAGNOSIS — E119 Type 2 diabetes mellitus without complications: Secondary | ICD-10-CM

## 2016-02-06 DIAGNOSIS — M67432 Ganglion, left wrist: Secondary | ICD-10-CM | POA: Diagnosis not present

## 2016-02-06 LAB — LIPID PANEL
CHOL/HDL RATIO: 6
Cholesterol: 226 mg/dL — ABNORMAL HIGH (ref 0–200)
HDL: 39 mg/dL — ABNORMAL LOW (ref >39.00)
LDL CALC: 150 mg/dL — AB (ref 0–99)
NONHDL: 186.51
Triglycerides: 183 mg/dL — ABNORMAL HIGH (ref 0.0–149.0)
VLDL: 36.6 mg/dL (ref 0.0–40.0)

## 2016-02-06 LAB — HEPATIC FUNCTION PANEL
ALBUMIN: 4.4 g/dL (ref 3.5–5.2)
ALK PHOS: 126 U/L — AB (ref 39–117)
ALT: 29 U/L (ref 0–35)
AST: 23 U/L (ref 0–37)
Bilirubin, Direct: 0.1 mg/dL (ref 0.0–0.3)
TOTAL PROTEIN: 7.5 g/dL (ref 6.0–8.3)
Total Bilirubin: 0.4 mg/dL (ref 0.2–1.2)

## 2016-02-06 LAB — BASIC METABOLIC PANEL
BUN: 16 mg/dL (ref 6–23)
CALCIUM: 10 mg/dL (ref 8.4–10.5)
CO2: 28 mEq/L (ref 19–32)
CREATININE: 0.86 mg/dL (ref 0.40–1.20)
Chloride: 104 mEq/L (ref 96–112)
GFR: 84.09 mL/min (ref >60.00)
GLUCOSE: 116 mg/dL — AB (ref 70–99)
Potassium: 3.9 mEq/L (ref 3.5–5.1)
SODIUM: 138 meq/L (ref 135–145)

## 2016-02-06 LAB — TSH: TSH: 1.99 u[IU]/mL (ref 0.35–4.50)

## 2016-02-06 LAB — HEMOGLOBIN A1C: Hgb A1c MFr Bld: 6.1 % (ref 4.6–6.5)

## 2016-02-06 LAB — MICROALBUMIN / CREATININE URINE RATIO
CREATININE, U: 116.1 mg/dL
Microalb Creat Ratio: 1.3 mg/g (ref 0.0–30.0)
Microalb, Ur: 1.5 mg/dL (ref 0.0–1.9)

## 2016-02-06 NOTE — Assessment & Plan Note (Signed)
on Norvasc Labs

## 2016-02-06 NOTE — Progress Notes (Signed)
Subjective:  Patient ID: Monica Horn, female    DOB: Oct 16, 1945  Age: 70 y.o. MRN: [id number]  CC: No chief complaint on file.   HPI Monica Horn presents for a R wrist ganglion (Dr Amedeo Plenty)  Outpatient Prescriptions Prior to Visit  Medication Sig Dispense Refill  . amLODipine (NORVASC) 5 MG tablet Take 1 tablet (5 mg total) by mouth daily. 30 tablet 11  . aspirin 81 MG tablet     . cetirizine (ZYRTEC) 10 MG tablet Take 1 tablet (10 mg total) by mouth daily as needed. 90 tablet 3  . Cholecalciferol (VITAMIN D3) 1000 UNITS tablet Take 1,000 Units by mouth daily.      Marland Kitchen ibuprofen (ADVIL,MOTRIN) 400 MG tablet TAKE ONE TABLET BY MOUTH EVERY 8 HOURS AS NEEDED FOR PAIN 60 tablet 2  . Multiple Vitamins-Minerals (WOMENS MULTIVITAMIN PLUS PO) daily.    . Na Sulfate-K Sulfate-Mg Sulf (SUPREP BOWEL PREP) SOLN Take 1 kit by mouth once. suprep as directed. No substitutions 354 mL 0  . pantoprazole (PROTONIX) 40 MG tablet Take 1 tablet (40 mg total) by mouth daily. 30 tablet 11   No facility-administered medications prior to visit.    ROS Review of Systems  Constitutional: Negative for chills, activity change, appetite change, fatigue and unexpected weight change.  HENT: Negative for congestion, mouth sores and sinus pressure.   Eyes: Negative for visual disturbance.  Respiratory: Negative for cough and chest tightness.   Gastrointestinal: Negative for nausea and abdominal pain.  Genitourinary: Negative for frequency, difficulty urinating and vaginal pain.  Musculoskeletal: Negative for back pain and gait problem.  Skin: Negative for pallor and rash.  Neurological: Negative for dizziness, tremors, weakness, numbness and headaches.  Psychiatric/Behavioral: Negative for suicidal ideas, confusion and sleep disturbance.    Objective:  BP 112/74 mmHg  Pulse 102  Wt 170 lb (77.111 kg)  SpO2 97%  BP Readings from Last 3 Encounters:  02/06/16 112/74  11/11/15 110/70  10/02/15 118/80     Wt Readings from Last 3 Encounters:  02/06/16 170 lb (77.111 kg)  11/11/15 169 lb (76.658 kg)  10/02/15 165 lb (74.844 kg)    Physical Exam  Constitutional: She appears well-developed. No distress.  HENT:  Head: Normocephalic.  Right Ear: External ear normal.  Left Ear: External ear normal.  Nose: Nose normal.  Mouth/Throat: Oropharynx is clear and moist.  Eyes: Conjunctivae are normal. Pupils are equal, round, and reactive to light. Right eye exhibits no discharge. Left eye exhibits no discharge.  Neck: Normal range of motion. Neck supple. No JVD present. No tracheal deviation present. No thyromegaly present.  Cardiovascular: Normal rate, regular rhythm and normal heart sounds.   Pulmonary/Chest: No stridor. No respiratory distress. She has no wheezes.  Abdominal: Soft. Bowel sounds are normal. She exhibits no distension and no mass. There is no tenderness. There is no rebound and no guarding.  Musculoskeletal: She exhibits tenderness. She exhibits no edema.  Lymphadenopathy:    She has no cervical adenopathy.  Neurological: She displays normal reflexes. No cranial nerve deficit. She exhibits normal muscle tone. Coordination normal.  Skin: No rash noted. No erythema.  Psychiatric: She has a normal mood and affect. Her behavior is normal. Judgment and thought content normal.  wrist ganglion  Lab Results  Component Value Date   WBC 16.4* 08/07/2008   HGB 15.6* 08/07/2008   HCT 46.0 08/07/2008   PLT 334 08/07/2008   GLUCOSE 91 05/13/2015   CHOL 205* 05/13/2015   TRIG  367.0* 05/13/2015   HDL 35.90* 05/13/2015   LDLDIRECT 120.0 05/13/2015   LDLCALC 137* 11/14/2014   ALT 40* 11/14/2014   AST 25 11/14/2014   NA 139 05/13/2015   K 3.7 05/13/2015   CL 107 05/13/2015   CREATININE 0.80 05/13/2015   BUN 17 05/13/2015   CO2 27 05/13/2015   TSH 1.71 11/14/2014   INR 1.0 04/19/2007   HGBA1C 6.1 05/13/2015    Mm Digital Screening Bilateral  02/08/2014  CLINICAL DATA:   Screening. EXAM: DIGITAL SCREENING BILATERAL MAMMOGRAM WITH CAD COMPARISON:  Previous exam(s). ACR Breast Density Category b: There are scattered areas of fibroglandular density. FINDINGS: There are no findings suspicious for malignancy. Images were processed with CAD. IMPRESSION: No mammographic evidence of malignancy. A result letter of this screening mammogram will be mailed directly to the patient. RECOMMENDATION: Screening mammogram in one year. (Code:SM-B-01Y) BI-RADS CATEGORY  1: Negative. Electronically Signed   By: Shon Hale M.D.   On: 02/08/2014 14:24    Assessment & Plan:   There are no diagnoses linked to this encounter. I am having Ms. Teters maintain her cholecalciferol, cetirizine, aspirin, Na Sulfate-K Sulfate-Mg Sulf, Multiple Vitamins-Minerals (WOMENS MULTIVITAMIN PLUS PO), amLODipine, pantoprazole, and ibuprofen.  No orders of the defined types were placed in this encounter.     Follow-up: No Follow-up on file.  Walker Kehr, MD

## 2016-02-06 NOTE — Assessment & Plan Note (Signed)
A1c

## 2016-02-06 NOTE — Assessment & Plan Note (Signed)
F/u w/Dr Gramig 

## 2016-02-07 LAB — HEPATITIS C ANTIBODY: HCV Ab: NEGATIVE

## 2016-04-09 ENCOUNTER — Encounter: Payer: Self-pay | Admitting: Internal Medicine

## 2016-04-09 ENCOUNTER — Ambulatory Visit (INDEPENDENT_AMBULATORY_CARE_PROVIDER_SITE_OTHER): Payer: Commercial Managed Care - HMO | Admitting: Internal Medicine

## 2016-04-09 VITALS — BP 150/88 | HR 97 | Ht 61.0 in | Wt 176.0 lb

## 2016-04-09 DIAGNOSIS — H6123 Impacted cerumen, bilateral: Secondary | ICD-10-CM

## 2016-04-09 DIAGNOSIS — E119 Type 2 diabetes mellitus without complications: Secondary | ICD-10-CM | POA: Diagnosis not present

## 2016-04-09 DIAGNOSIS — E785 Hyperlipidemia, unspecified: Secondary | ICD-10-CM

## 2016-04-09 DIAGNOSIS — I10 Essential (primary) hypertension: Secondary | ICD-10-CM

## 2016-04-09 DIAGNOSIS — Z0000 Encounter for general adult medical examination without abnormal findings: Secondary | ICD-10-CM | POA: Insufficient documentation

## 2016-04-09 MED ORDER — TRIAMTERENE-HCTZ 37.5-25 MG PO TABS: 1.0000 | ORAL_TABLET | Freq: Every day | ORAL | 11 refills | Status: DC

## 2016-04-09 NOTE — Assessment & Plan Note (Signed)
Chronic - on Norvasc Add Maxzide

## 2016-04-09 NOTE — Progress Notes (Signed)
Subjective:  Patient ID: Monica Horn, female    DOB: Feb 15, 1946  Age: 70 y.o. MRN: [id number]  CC: No chief complaint on file.   HPI Monica Horn presents for a well exam C/o wt gain, HTN...  Outpatient Medications Prior to Visit  Medication Sig Dispense Refill  . amLODipine (NORVASC) 5 MG tablet Take 1 tablet (5 mg total) by mouth daily. 30 tablet 11  . aspirin 81 MG tablet     . cetirizine (ZYRTEC) 10 MG tablet Take 1 tablet (10 mg total) by mouth daily as needed. 90 tablet 3  . Cholecalciferol (VITAMIN D3) 1000 UNITS tablet Take 1,000 Units by mouth daily.      Marland Kitchen ibuprofen (ADVIL,MOTRIN) 400 MG tablet TAKE ONE TABLET BY MOUTH EVERY 8 HOURS AS NEEDED FOR PAIN 60 tablet 2  . Multiple Vitamins-Minerals (WOMENS MULTIVITAMIN PLUS PO) daily.    . Na Sulfate-K Sulfate-Mg Sulf (SUPREP BOWEL PREP) SOLN Take 1 kit by mouth once. suprep as directed. No substitutions 354 mL 0  . pantoprazole (PROTONIX) 40 MG tablet Take 1 tablet (40 mg total) by mouth daily. 30 tablet 11   No facility-administered medications prior to visit.     ROS Review of Systems  Constitutional: Negative for activity change, appetite change, chills, fatigue and unexpected weight change.  HENT: Negative for congestion, mouth sores and sinus pressure.   Eyes: Negative for visual disturbance.  Respiratory: Negative for cough and chest tightness.   Gastrointestinal: Negative for abdominal pain and nausea.  Genitourinary: Negative for difficulty urinating, frequency and vaginal pain.  Musculoskeletal: Negative for back pain and gait problem.  Skin: Negative for pallor and rash.  Neurological: Negative for dizziness, tremors, weakness, numbness and headaches.  Psychiatric/Behavioral: Negative for confusion and sleep disturbance.    Objective:  BP (!) 150/88   Pulse 97   Ht _0  (1.549 m)   Wt 176 lb (79.8 kg)   SpO2 96%   BMI 33.25 kg/m   BP Readings from Last 3 Encounters:  04/09/16 (!) 150/88    02/06/16 112/74  11/11/15 110/70    Wt Readings from Last 3 Encounters:  04/09/16 176 lb (79.8 kg)  02/06/16 170 lb (77.1 kg)  11/11/15 169 lb (76.7 kg)    Physical Exam  Constitutional: She appears well-developed. No distress.  HENT:  Head: Normocephalic.  Right Ear: External ear normal.  Left Ear: External ear normal.  Nose: Nose normal.  Mouth/Throat: Oropharynx is clear and moist.  Eyes: Conjunctivae are normal. Pupils are equal, round, and reactive to light. Right eye exhibits no discharge. Left eye exhibits no discharge.  Neck: Normal range of motion. Neck supple. No JVD present. No tracheal deviation present. No thyromegaly present.  Cardiovascular: Normal rate, regular rhythm and normal heart sounds.   Pulmonary/Chest: No stridor. No respiratory distress. She has no wheezes.  Abdominal: Soft. Bowel sounds are normal. She exhibits no distension and no mass. There is no tenderness. There is no rebound and no guarding.  Musculoskeletal: She exhibits no edema or tenderness.  Lymphadenopathy:    She has no cervical adenopathy.  Neurological: She displays normal reflexes. No cranial nerve deficit. She exhibits normal muscle tone. Coordination normal.  Skin: No rash noted. No erythema.  Psychiatric: She has a normal mood and affect. Her behavior is normal. Judgment and thought content normal.  Obese B wax Trace edema B  Lab Results  Component Value Date   WBC 16.4 (H) 08/07/2008   HGB 15.6 (H) 08/07/2008  HCT 46.0 08/07/2008   PLT 334 08/07/2008   GLUCOSE 116 (H) 02/06/2016   CHOL 226 (H) 02/06/2016   TRIG 183.0 (H) 02/06/2016   HDL 39.00 (L) 02/06/2016   LDLDIRECT 120.0 05/13/2015   LDLCALC 150 (H) 02/06/2016   ALT 29 02/06/2016   AST 23 02/06/2016   NA 138 02/06/2016   K 3.9 02/06/2016   CL 104 02/06/2016   CREATININE 0.86 02/06/2016   BUN 16 02/06/2016   CO2 28 02/06/2016   TSH 1.99 02/06/2016   INR 1.0 04/19/2007   HGBA1C 6.1 02/06/2016   MICROALBUR 1.5  02/06/2016    Mm Digital Screening Bilateral  Result Date: 02/08/2014 CLINICAL DATA:  Screening. EXAM: DIGITAL SCREENING BILATERAL MAMMOGRAM WITH CAD COMPARISON:  Previous exam(s). ACR Breast Density Category b: There are scattered areas of fibroglandular density. FINDINGS: There are no findings suspicious for malignancy. Images were processed with CAD. IMPRESSION: No mammographic evidence of malignancy. A result letter of this screening mammogram will be mailed directly to the patient. RECOMMENDATION: Screening mammogram in one year. (Code:SM-B-01Y) BI-RADS CATEGORY  1: Negative. Electronically Signed   By: Shon Hale M.D.   On: 02/08/2014 14:24    Assessment & Plan:   There are no diagnoses linked to this encounter. I am having Ms. Greenwalt maintain her cholecalciferol, cetirizine, aspirin, Na Sulfate-K Sulfate-Mg Sulf, Multiple Vitamins-Minerals (WOMENS MULTIVITAMIN PLUS PO), amLODipine, pantoprazole, and ibuprofen.  No orders of the defined types were placed in this encounter.    Follow-up: No Follow-up on file.  Walker Kehr, MD

## 2016-04-09 NOTE — Assessment & Plan Note (Signed)

## 2016-04-09 NOTE — Patient Instructions (Signed)
Kegel Exercises  The goal of Kegel exercises is to isolate and exercise your pelvic floor muscles. These muscles act as a hammock that supports the rectum, vagina, small intestine, and uterus. As the muscles weaken, the hammock sags and these organs are displaced from their normal positions. Kegel exercises can strengthen your pelvic floor muscles and help you to improve bladder and bowel control, improve sexual response, and help reduce many problems and some discomfort during pregnancy. Kegel exercises can be done anywhere and at any time.  HOW TO PERFORM KEGEL EXERCISES  1. Locate your pelvic floor muscles. To do this, squeeze (contract) the muscles that you use when you try to stop the flow of urine. You will feel a tightness in the vaginal area (women) and a tight lift in the rectal area (men and women).  2. When you begin, contract your pelvic muscles tight for 2-5 seconds, then relax them for 2-5 seconds. This is one set. Do 4-5 sets with a short pause in between.  3. Contract your pelvic muscles for 8-10 seconds, then relax them for 8-10 seconds. Do 4-5 sets. If you cannot contract your pelvic muscles for 8-10 seconds, try 5-7 seconds and work your way up to 8-10 seconds. Your goal is 4-5 sets of 10 contractions each day.  Keep your stomach, buttocks, and legs relaxed during the exercises. Perform sets of both short and long contractions. Vary your positions. Perform these contractions 3-4 times per day. Perform sets while you are:    · Lying in bed in the morning.  · Standing at lunch.  · Sitting in the late afternoon.  · Lying in bed at night.   You should do 40-50 contractions per day. Do not perform more Kegel exercises per day than recommended. Overexercising can cause muscle fatigue. Continue these exercises for for at least 15-20 weeks or as directed by your caregiver.     This information is not intended to replace advice given to you by your health care provider. Make sure you discuss any questions  you have with your health care provider.     Document Released: 07/19/2012 Document Revised: 08/23/2014 Document Reviewed: 07/19/2012  Elsevier Interactive Patient Education ©2016 [employer]

## 2016-04-09 NOTE — Assessment & Plan Note (Signed)
Diet controlled.  

## 2016-04-09 NOTE — Progress Notes (Signed)
Pre visit review using our clinic review tool, if applicable. No additional management support is needed unless otherwise documented below in the visit note. 

## 2016-04-09 NOTE — Assessment & Plan Note (Signed)
She will irrigate at home

## 2016-04-16 ENCOUNTER — Other Ambulatory Visit (INDEPENDENT_AMBULATORY_CARE_PROVIDER_SITE_OTHER): Payer: Commercial Managed Care - HMO

## 2016-04-16 DIAGNOSIS — Z0000 Encounter for general adult medical examination without abnormal findings: Secondary | ICD-10-CM | POA: Diagnosis not present

## 2016-04-16 DIAGNOSIS — E119 Type 2 diabetes mellitus without complications: Secondary | ICD-10-CM

## 2016-04-16 DIAGNOSIS — I10 Essential (primary) hypertension: Secondary | ICD-10-CM | POA: Diagnosis not present

## 2016-04-16 DIAGNOSIS — E785 Hyperlipidemia, unspecified: Secondary | ICD-10-CM | POA: Diagnosis not present

## 2016-04-16 LAB — URINALYSIS, ROUTINE W REFLEX MICROSCOPIC
Bilirubin Urine: NEGATIVE
Hgb urine dipstick: NEGATIVE
Ketones, ur: NEGATIVE
Nitrite: NEGATIVE
SPECIFIC GRAVITY, URINE: 1.02 (ref 1.000–1.030)
TOTAL PROTEIN, URINE-UPE24: NEGATIVE
URINE GLUCOSE: NEGATIVE
UROBILINOGEN UA: 0.2 (ref 0.0–1.0)
pH: 6 (ref 5.0–8.0)

## 2016-04-16 LAB — CBC WITH DIFFERENTIAL/PLATELET
BASOS PCT: 0.8 % (ref 0.0–3.0)
Basophils Absolute: 0.1 10*3/uL (ref 0.0–0.1)
EOS PCT: 3 % (ref 0.0–5.0)
Eosinophils Absolute: 0.2 10*3/uL (ref 0.0–0.7)
HCT: 38.3 % (ref 36.0–46.0)
HEMOGLOBIN: 12.2 g/dL (ref 12.0–15.0)
LYMPHS ABS: 2.2 10*3/uL (ref 0.7–4.0)
Lymphocytes Relative: 34.6 % (ref 12.0–46.0)
MCHC: 31.8 g/dL (ref 30.0–36.0)
MCV: 73.6 fl — AB (ref 78.0–100.0)
MONOS PCT: 11.4 % (ref 3.0–12.0)
Monocytes Absolute: 0.7 10*3/uL (ref 0.1–1.0)
NEUTROS PCT: 50.2 % (ref 43.0–77.0)
Neutro Abs: 3.2 10*3/uL (ref 1.4–7.7)
Platelets: 359 10*3/uL (ref 150.0–400.0)
RBC: 5.21 Mil/uL — AB (ref 3.87–5.11)
RDW: 14.6 % (ref 11.5–15.5)
WBC: 6.4 10*3/uL (ref 4.0–10.5)

## 2016-04-16 LAB — LIPID PANEL
CHOL/HDL RATIO: 6
Cholesterol: 230 mg/dL — ABNORMAL HIGH (ref 0–200)
HDL: 36 mg/dL — AB (ref >39.00)
LDL Cholesterol: 161 mg/dL — ABNORMAL HIGH (ref 0–99)
NONHDL: 194.43
Triglycerides: 167 mg/dL — ABNORMAL HIGH (ref 0.0–149.0)
VLDL: 33.4 mg/dL (ref 0.0–40.0)

## 2016-04-16 LAB — BASIC METABOLIC PANEL
BUN: 16 mg/dL (ref 6–23)
CHLORIDE: 104 meq/L (ref 96–112)
CO2: 26 mEq/L (ref 19–32)
Calcium: 9.3 mg/dL (ref 8.4–10.5)
Creatinine, Ser: 0.93 mg/dL (ref 0.40–1.20)
GFR: 76.79 mL/min (ref >60.00)
GLUCOSE: 116 mg/dL — AB (ref 70–99)
POTASSIUM: 3.8 meq/L (ref 3.5–5.1)
SODIUM: 138 meq/L (ref 135–145)

## 2016-04-16 LAB — HEPATIC FUNCTION PANEL
ALBUMIN: 4.2 g/dL (ref 3.5–5.2)
ALT: 36 U/L — ABNORMAL HIGH (ref 0–35)
AST: 24 U/L (ref 0–37)
Alkaline Phosphatase: 126 U/L — ABNORMAL HIGH (ref 39–117)
Bilirubin, Direct: 0 mg/dL (ref 0.0–0.3)
Total Bilirubin: 0.4 mg/dL (ref 0.2–1.2)
Total Protein: 7.5 g/dL (ref 6.0–8.3)

## 2016-04-16 LAB — TSH: TSH: 1.79 u[IU]/mL (ref 0.35–4.50)

## 2016-04-16 LAB — HEPATITIS C ANTIBODY: HCV Ab: NEGATIVE

## 2016-04-22 ENCOUNTER — Other Ambulatory Visit: Payer: Self-pay | Admitting: *Deleted

## 2016-04-22 MED ORDER — PANTOPRAZOLE SODIUM 40 MG PO TBEC: 40.0000 mg | DELAYED_RELEASE_TABLET | Freq: Every day | ORAL | 3 refills | Status: DC

## 2016-04-22 MED ORDER — AMLODIPINE BESYLATE 5 MG PO TABS: 5.0000 mg | ORAL_TABLET | Freq: Every day | ORAL | 3 refills | Status: DC

## 2016-04-22 MED ORDER — TRIAMTERENE-HCTZ 37.5-25 MG PO TABS: 1.0000 | ORAL_TABLET | Freq: Every day | ORAL | 3 refills | Status: DC

## 2016-04-22 NOTE — Addendum Note (Signed)
Addended by: Earnstine Regal on: 04/22/2016 11:26 AM   Modules accepted: Orders

## 2016-04-23 DIAGNOSIS — Z6832 Body mass index (BMI) 32.0-32.9, adult: Secondary | ICD-10-CM | POA: Diagnosis not present

## 2016-04-23 DIAGNOSIS — Z779 Other contact with and (suspected) exposures hazardous to health: Secondary | ICD-10-CM | POA: Diagnosis not present

## 2016-05-04 ENCOUNTER — Ambulatory Visit (INDEPENDENT_AMBULATORY_CARE_PROVIDER_SITE_OTHER): Payer: Commercial Managed Care - HMO

## 2016-05-04 ENCOUNTER — Other Ambulatory Visit: Payer: Self-pay | Admitting: Internal Medicine

## 2016-05-04 DIAGNOSIS — Z23 Encounter for immunization: Secondary | ICD-10-CM

## 2016-05-04 DIAGNOSIS — Z1231 Encounter for screening mammogram for malignant neoplasm of breast: Secondary | ICD-10-CM

## 2016-05-06 ENCOUNTER — Ambulatory Visit: Payer: Commercial Managed Care - HMO

## 2016-05-11 ENCOUNTER — Other Ambulatory Visit: Payer: Self-pay | Admitting: *Deleted

## 2016-05-11 MED ORDER — IBUPROFEN 400 MG PO TABS: 400.0000 mg | ORAL_TABLET | Freq: Three times a day (TID) | ORAL | 0 refills | Status: DC | PRN

## 2016-05-11 NOTE — Telephone Encounter (Signed)
Pt left msg on triage stating she is needing new rx sent to [facility] on her Ibuprofen. Called pt back spoke w/husband inform him refill has been sent to [facility]...Johny Chess

## 2016-05-14 ENCOUNTER — Ambulatory Visit: Payer: Commercial Managed Care - HMO

## 2016-05-14 ENCOUNTER — Ambulatory Visit
Admission: RE | Admit: 2016-05-14 | Discharge: 2016-05-14 | Disposition: A | Discharge status: Home / Self Care | Payer: Commercial Managed Care - HMO | Source: Ambulatory Visit | Attending: Internal Medicine | Admitting: Internal Medicine

## 2016-05-14 DIAGNOSIS — Z1231 Encounter for screening mammogram for malignant neoplasm of breast: Secondary | ICD-10-CM

## 2016-07-02 DIAGNOSIS — H5203 Hypermetropia, bilateral: Secondary | ICD-10-CM | POA: Diagnosis not present

## 2016-07-02 DIAGNOSIS — H10413 Chronic giant papillary conjunctivitis, bilateral: Secondary | ICD-10-CM | POA: Diagnosis not present

## 2016-07-02 DIAGNOSIS — H524 Presbyopia: Secondary | ICD-10-CM | POA: Diagnosis not present

## 2016-07-02 DIAGNOSIS — H04123 Dry eye syndrome of bilateral lacrimal glands: Secondary | ICD-10-CM | POA: Diagnosis not present

## 2016-07-02 DIAGNOSIS — H2513 Age-related nuclear cataract, bilateral: Secondary | ICD-10-CM | POA: Diagnosis not present

## 2016-07-16 ENCOUNTER — Encounter: Payer: Self-pay | Admitting: Internal Medicine

## 2016-07-16 ENCOUNTER — Ambulatory Visit (INDEPENDENT_AMBULATORY_CARE_PROVIDER_SITE_OTHER): Payer: Commercial Managed Care - HMO | Admitting: Internal Medicine

## 2016-07-16 VITALS — BP 118/70 | HR 104 | Wt 175.0 lb

## 2016-07-16 DIAGNOSIS — E119 Type 2 diabetes mellitus without complications: Secondary | ICD-10-CM | POA: Diagnosis not present

## 2016-07-16 DIAGNOSIS — I10 Essential (primary) hypertension: Secondary | ICD-10-CM | POA: Diagnosis not present

## 2016-07-16 DIAGNOSIS — E785 Hyperlipidemia, unspecified: Secondary | ICD-10-CM

## 2016-07-16 DIAGNOSIS — R600 Localized edema: Secondary | ICD-10-CM | POA: Diagnosis not present

## 2016-07-16 NOTE — Progress Notes (Signed)
Pre visit review using our clinic review tool, if applicable. No additional management support is needed unless otherwise documented below in the visit note. 

## 2016-07-16 NOTE — Assessment & Plan Note (Signed)
Maxzide, Norvasc

## 2016-07-16 NOTE — Progress Notes (Signed)
Subjective:  Patient ID: Monica Horn, female    DOB: August 10, 1946  Age: 70 y.o. MRN: [id number]  CC: No chief complaint on file.   HPI Monica Horn presents for GERD, edema, HTN, allergies  Outpatient Medications Prior to Visit  Medication Sig Dispense Refill  . amLODipine (NORVASC) 5 MG tablet Take 1 tablet (5 mg total) by mouth daily. 90 tablet 3  . aspirin 81 MG tablet     . cetirizine (ZYRTEC) 10 MG tablet Take 1 tablet (10 mg total) by mouth daily as needed. 90 tablet 3  . Cholecalciferol (VITAMIN D3) 1000 UNITS tablet Take 1,000 Units by mouth daily.      Marland Kitchen ibuprofen (ADVIL,MOTRIN) 400 MG tablet Take 1 tablet (400 mg total) by mouth every 8 (eight) hours as needed. for pain 90 tablet 0  . Multiple Vitamins-Minerals (WOMENS MULTIVITAMIN PLUS PO) daily.    . Na Sulfate-K Sulfate-Mg Sulf (SUPREP BOWEL PREP) SOLN Take 1 kit by mouth once. suprep as directed. No substitutions 354 mL 0  . pantoprazole (PROTONIX) 40 MG tablet Take 1 tablet (40 mg total) by mouth daily. 90 tablet 3  . triamterene-hydrochlorothiazide (MAXZIDE-25) 37.5-25 MG tablet Take 1 tablet by mouth daily. 90 tablet 3   No facility-administered medications prior to visit.     ROS Review of Systems  Constitutional: Negative for activity change, appetite change, chills, fatigue and unexpected weight change.  HENT: Negative for congestion, mouth sores and sinus pressure.   Eyes: Negative for visual disturbance.  Respiratory: Negative for cough and chest tightness.   Gastrointestinal: Negative for abdominal pain and nausea.  Genitourinary: Negative for difficulty urinating, frequency and vaginal pain.  Musculoskeletal: Negative for back pain and gait problem.  Skin: Negative for pallor and rash.  Neurological: Negative for dizziness, tremors, weakness, numbness and headaches.  Psychiatric/Behavioral: Negative for confusion and sleep disturbance. The patient is not nervous/anxious.     Objective:  BP 118/70    Pulse (!) 104   Wt 175 lb (79.4 kg)   SpO2 99%   BMI 33.07 kg/m   BP Readings from Last 3 Encounters:  07/16/16 118/70  04/09/16 (!) 150/88  02/06/16 112/74    Wt Readings from Last 3 Encounters:  07/16/16 175 lb (79.4 kg)  04/09/16 176 lb (79.8 kg)  02/06/16 170 lb (77.1 kg)    Physical Exam  Constitutional: She appears well-developed. No distress.  HENT:  Head: Normocephalic.  Right Ear: External ear normal.  Left Ear: External ear normal.  Nose: Nose normal.  Mouth/Throat: Oropharynx is clear and moist.  Eyes: Conjunctivae are normal. Pupils are equal, round, and reactive to light. Right eye exhibits no discharge. Left eye exhibits no discharge.  Neck: Normal range of motion. Neck supple. No JVD present. No tracheal deviation present. No thyromegaly present.  Cardiovascular: Normal rate, regular rhythm and normal heart sounds.   Pulmonary/Chest: No stridor. No respiratory distress. She has no wheezes.  Abdominal: Soft. Bowel sounds are normal. She exhibits no distension and no mass. There is no tenderness. There is no rebound and no guarding.  Musculoskeletal: She exhibits no edema or tenderness.  Lymphadenopathy:    She has no cervical adenopathy.  Neurological: She displays normal reflexes. No cranial nerve deficit. She exhibits normal muscle tone. Coordination normal.  Skin: No rash noted. No erythema.  Psychiatric: She has a normal mood and affect. Her behavior is normal. Judgment and thought content normal.    Lab Results  Component Value Date  WBC 6.4 04/16/2016   HGB 12.2 04/16/2016   HCT 38.3 04/16/2016   PLT 359.0 04/16/2016   GLUCOSE 116 (H) 04/16/2016   CHOL 230 (H) 04/16/2016   TRIG 167.0 (H) 04/16/2016   HDL 36.00 (L) 04/16/2016   LDLDIRECT 120.0 05/13/2015   LDLCALC 161 (H) 04/16/2016   ALT 36 (H) 04/16/2016   AST 24 04/16/2016   NA 138 04/16/2016   K 3.8 04/16/2016   CL 104 04/16/2016   CREATININE 0.93 04/16/2016   BUN 16 04/16/2016   CO2  26 04/16/2016   TSH 1.79 04/16/2016   INR 1.0 04/19/2007   HGBA1C 6.1 02/06/2016   MICROALBUR 1.5 02/06/2016    Mm Digital Screening Bilateral  Result Date: 05/18/2016 CLINICAL DATA:  Screening. EXAM: DIGITAL SCREENING BILATERAL MAMMOGRAM WITH CAD COMPARISON:  Previous exam(s). ACR Breast Density Category b: There are scattered areas of fibroglandular density. FINDINGS: There are no findings suspicious for malignancy. Images were processed with CAD. IMPRESSION: No mammographic evidence of malignancy. A result letter of this screening mammogram will be mailed directly to the patient. RECOMMENDATION: Screening mammogram in one year. (Code:SM-B-01Y) BI-RADS CATEGORY  1: Negative. Electronically Signed   By: Abelardo Diesel M.D.   On: 05/18/2016 13:20    Assessment & Plan:   There are no diagnoses linked to this encounter. I am having Ms. Cater maintain her cholecalciferol, cetirizine, aspirin, Na Sulfate-K Sulfate-Mg Sulf, Multiple Vitamins-Minerals (WOMENS MULTIVITAMIN PLUS PO), triamterene-hydrochlorothiazide, amLODipine, pantoprazole, and ibuprofen.  No orders of the defined types were placed in this encounter.    Follow-up: No Follow-up on file.  Walker Kehr, MD

## 2016-07-16 NOTE — Assessment & Plan Note (Signed)
Resolved on Maxzide

## 2016-07-16 NOTE — Assessment & Plan Note (Signed)
On diet Labs 

## 2016-07-21 ENCOUNTER — Other Ambulatory Visit: Payer: Self-pay | Admitting: *Deleted

## 2016-07-21 MED ORDER — IBUPROFEN 400 MG PO TABS: 400.0000 mg | ORAL_TABLET | Freq: Three times a day (TID) | ORAL | 0 refills | Status: DC | PRN

## 2016-07-21 NOTE — Telephone Encounter (Signed)
Requesting refill on Ibuprofen to be sent to [facility]. Verified records refill sent electronically to [facility]...Johny Chess

## 2016-08-03 ENCOUNTER — Other Ambulatory Visit: Payer: Self-pay | Admitting: Emergency Medicine

## 2016-08-03 NOTE — Telephone Encounter (Signed)
Called pt no answer LMOM RTC need to know which pharmacy she want rx sent...Johny Chess

## 2016-08-03 NOTE — Telephone Encounter (Signed)
Pt called and stated her insurance company needs the dosage changed on her ibuprofen (ADVIL,MOTRIN) 400 MG tablet to 90 pills for 90 days instead of 90 pills for 30 days. They will not cover it the other way. Please advise thanks.

## 2016-08-04 MED ORDER — IBUPROFEN 400 MG PO TABS: 400.0000 mg | ORAL_TABLET | Freq: Three times a day (TID) | ORAL | 0 refills | Status: DC | PRN

## 2016-08-04 NOTE — Telephone Encounter (Signed)
Sent 90 day to [facility]...Monica Horn

## 2016-08-04 NOTE — Telephone Encounter (Signed)
Patient called back. She is using [place] mail order

## 2016-09-10 DIAGNOSIS — H10413 Chronic giant papillary conjunctivitis, bilateral: Secondary | ICD-10-CM | POA: Diagnosis not present

## 2016-09-10 DIAGNOSIS — H04123 Dry eye syndrome of bilateral lacrimal glands: Secondary | ICD-10-CM | POA: Diagnosis not present

## 2016-10-01 DIAGNOSIS — M1612 Unilateral primary osteoarthritis, left hip: Secondary | ICD-10-CM | POA: Diagnosis not present

## 2016-11-19 ENCOUNTER — Encounter: Payer: Self-pay | Admitting: Internal Medicine

## 2016-11-19 ENCOUNTER — Ambulatory Visit (INDEPENDENT_AMBULATORY_CARE_PROVIDER_SITE_OTHER): Payer: Medicare HMO | Admitting: Internal Medicine

## 2016-11-19 DIAGNOSIS — S0083XA Contusion of other part of head, initial encounter: Secondary | ICD-10-CM | POA: Diagnosis not present

## 2016-11-19 HISTORY — DX: Contusion of other part of head, initial encounter: S00.83XA

## 2016-11-19 NOTE — Assessment & Plan Note (Addendum)
C/o hitting a cabinet after chocking on ice - fell; ?passed out at the client's house November 04 2016; had a hematoma on the central forehead. She had a HA x1d. She had a black eye later. No n/v. r/o concussion, nose injury - see a workman's comp doctor next week

## 2016-11-19 NOTE — Progress Notes (Signed)
Subjective:  Patient ID: Monica Horn, female    DOB: 03-06-1946  Age: 71 y.o. MRN: [id number]  CC: Cyst (Pt have a knot forehaed for about 2 weeks); Hypertension; Diabetes; and Hyperlipidemia   HPI Monica Horn presents for HTN, GERD, allergies C/o hitting a cabinet after chocking on ice - fell; ?passed out at the client's house November 04 2016; had a hematoma on the central forehead. She had a HA x1d. She had a black eye later. No n/v.   Outpatient Medications Prior to Visit  Medication Sig Dispense Refill  . amLODipine (NORVASC) 5 MG tablet Take 1 tablet (5 mg total) by mouth daily. 90 tablet 3  . aspirin 81 MG tablet     . cetirizine (ZYRTEC) 10 MG tablet Take 1 tablet (10 mg total) by mouth daily as needed. 90 tablet 3  . Cholecalciferol (VITAMIN D3) 1000 UNITS tablet Take 1,000 Units by mouth daily.      Marland Kitchen ibuprofen (ADVIL,MOTRIN) 400 MG tablet Take 1 tablet (400 mg total) by mouth every 8 (eight) hours as needed. for pain 90 tablet 0  . Multiple Vitamins-Minerals (WOMENS MULTIVITAMIN PLUS PO) daily.    . Na Sulfate-K Sulfate-Mg Sulf (SUPREP BOWEL PREP) SOLN Take 1 kit by mouth once. suprep as directed. No substitutions 354 mL 0  . pantoprazole (PROTONIX) 40 MG tablet Take 1 tablet (40 mg total) by mouth daily. 90 tablet 3  . triamterene-hydrochlorothiazide (MAXZIDE-25) 37.5-25 MG tablet Take 1 tablet by mouth daily. 90 tablet 3   No facility-administered medications prior to visit.     ROS Review of Systems  Constitutional: Negative for activity change, appetite change, chills, fatigue and unexpected weight change.  HENT: Negative for congestion, mouth sores and sinus pressure.   Eyes: Negative for visual disturbance.  Respiratory: Negative for cough and chest tightness.   Gastrointestinal: Negative for abdominal pain and nausea.  Genitourinary: Negative for difficulty urinating, frequency and vaginal pain.  Musculoskeletal: Negative for back pain and gait problem.    Skin: Negative for pallor and rash.  Neurological: Negative for dizziness, tremors, weakness, numbness and headaches.  Psychiatric/Behavioral: Negative for confusion and sleep disturbance.    Objective:  BP 138/88   Pulse (!) 103   Temp 97.6 F (36.4 C)   Ht 5' 1"  (1.549 m)   Wt 175 lb (79.4 kg)   SpO2 94%   BMI 33.07 kg/m   BP Readings from Last 3 Encounters:  11/19/16 138/88  07/16/16 118/70  04/09/16 (!) 150/88    Wt Readings from Last 3 Encounters:  11/19/16 175 lb (79.4 kg)  07/16/16 175 lb (79.4 kg)  04/09/16 176 lb (79.8 kg)    Physical Exam  Constitutional: She appears well-developed. No distress.  HENT:  Head: Normocephalic.  Right Ear: External ear normal.  Left Ear: External ear normal.  Nose: Nose normal.  Mouth/Throat: Oropharynx is clear and moist.  Eyes: Conjunctivae are normal. Pupils are equal, round, and reactive to light. Right eye exhibits no discharge. Left eye exhibits no discharge.  Neck: Normal range of motion. Neck supple. No JVD present. No tracheal deviation present. No thyromegaly present.  Cardiovascular: Normal rate, regular rhythm and normal heart sounds.   Pulmonary/Chest: No stridor. No respiratory distress. She has no wheezes.  Abdominal: Soft. Bowel sounds are normal. She exhibits no distension and no mass. There is no tenderness. There is no rebound and no guarding.  Musculoskeletal: She exhibits no edema or tenderness.  Lymphadenopathy:    She  has no cervical adenopathy.  Neurological: She displays normal reflexes. No cranial nerve deficit. She exhibits normal muscle tone. Coordination normal.  Skin: No rash noted. No erythema.  Psychiatric: She has a normal mood and affect. Her behavior is normal. Judgment and thought content normal.  small residual hematoma on central forehead  Lab Results  Component Value Date   WBC 6.4 04/16/2016   HGB 12.2 04/16/2016   HCT 38.3 04/16/2016   PLT 359.0 04/16/2016   GLUCOSE 116 (H)  04/16/2016   CHOL 230 (H) 04/16/2016   TRIG 167.0 (H) 04/16/2016   HDL 36.00 (L) 04/16/2016   LDLDIRECT 120.0 05/13/2015   LDLCALC 161 (H) 04/16/2016   ALT 36 (H) 04/16/2016   AST 24 04/16/2016   NA 138 04/16/2016   K 3.8 04/16/2016   CL 104 04/16/2016   CREATININE 0.93 04/16/2016   BUN 16 04/16/2016   CO2 26 04/16/2016   TSH 1.79 04/16/2016   INR 1.0 04/19/2007   HGBA1C 6.1 02/06/2016   MICROALBUR 1.5 02/06/2016    Mm Digital Screening Bilateral  Result Date: 05/14/2016 CLINICAL DATA:  Screening. EXAM: DIGITAL SCREENING BILATERAL MAMMOGRAM WITH CAD COMPARISON:  Previous exam(s). ACR Breast Density Category b: There are scattered areas of fibroglandular density. FINDINGS: There are no findings suspicious for malignancy. Images were processed with CAD. IMPRESSION: No mammographic evidence of malignancy. A result letter of this screening mammogram will be mailed directly to the patient. RECOMMENDATION: Screening mammogram in one year. (Code:SM-B-01Y) BI-RADS CATEGORY  1: Negative. Electronically Signed   By: Abelardo Diesel M.D.   On: 05/18/2016 13:20    Assessment & Plan:   There are no diagnoses linked to this encounter. I am having Ms. Chmiel maintain her cholecalciferol, cetirizine, aspirin, Na Sulfate-K Sulfate-Mg Sulf, Multiple Vitamins-Minerals (WOMENS MULTIVITAMIN PLUS PO), triamterene-hydrochlorothiazide, amLODipine, pantoprazole, and ibuprofen.  No orders of the defined types were placed in this encounter.    Follow-up: No Follow-up on file.  Walker Kehr, MD

## 2016-11-24 ENCOUNTER — Other Ambulatory Visit: Payer: Self-pay | Admitting: Internal Medicine

## 2016-11-24 ENCOUNTER — Ambulatory Visit (INDEPENDENT_AMBULATORY_CARE_PROVIDER_SITE_OTHER)
Admission: RE | Admit: 2016-11-24 | Discharge: 2016-11-24 | Disposition: A | Discharge status: Home / Self Care | Payer: Medicare HMO | Source: Ambulatory Visit | Attending: Internal Medicine | Admitting: Internal Medicine

## 2016-11-24 DIAGNOSIS — S022XXA Fracture of nasal bones, initial encounter for closed fracture: Secondary | ICD-10-CM | POA: Diagnosis not present

## 2016-11-24 DIAGNOSIS — J3489 Other specified disorders of nose and nasal sinuses: Secondary | ICD-10-CM | POA: Diagnosis not present

## 2016-11-24 DIAGNOSIS — W1800XA Striking against unspecified object with subsequent fall, initial encounter: Secondary | ICD-10-CM

## 2016-11-24 DIAGNOSIS — R220 Localized swelling, mass and lump, head: Secondary | ICD-10-CM | POA: Diagnosis not present

## 2016-11-25 ENCOUNTER — Other Ambulatory Visit: Payer: Self-pay | Admitting: Internal Medicine

## 2016-11-25 DIAGNOSIS — S022XXD Fracture of nasal bones, subsequent encounter for fracture with routine healing: Secondary | ICD-10-CM

## 2016-12-09 DIAGNOSIS — S022XXA Fracture of nasal bones, initial encounter for closed fracture: Secondary | ICD-10-CM

## 2016-12-09 HISTORY — DX: Fracture of nasal bones, initial encounter for closed fracture: S02.2XXA

## 2016-12-17 ENCOUNTER — Encounter: Payer: Self-pay | Admitting: Internal Medicine

## 2016-12-17 ENCOUNTER — Ambulatory Visit (INDEPENDENT_AMBULATORY_CARE_PROVIDER_SITE_OTHER): Payer: Medicare HMO | Admitting: Internal Medicine

## 2016-12-17 DIAGNOSIS — S022XXA Fracture of nasal bones, initial encounter for closed fracture: Secondary | ICD-10-CM | POA: Insufficient documentation

## 2016-12-17 DIAGNOSIS — S022XXS Fracture of nasal bones, sequela: Secondary | ICD-10-CM | POA: Diagnosis not present

## 2016-12-17 DIAGNOSIS — S0083XA Contusion of other part of head, initial encounter: Secondary | ICD-10-CM | POA: Diagnosis not present

## 2016-12-17 NOTE — Progress Notes (Signed)
Subjective:  Patient ID: Monica Horn, female    DOB: 1946/03/20  Age: 71 y.o. MRN: [id number]  CC: No chief complaint on file.   HPI Monica Horn presents for nose fx  Outpatient Medications Prior to Visit  Medication Sig Dispense Refill  . amLODipine (NORVASC) 5 MG tablet Take 1 tablet (5 mg total) by mouth daily. 90 tablet 3  . aspirin 81 MG tablet     . cetirizine (ZYRTEC) 10 MG tablet Take 1 tablet (10 mg total) by mouth daily as needed. 90 tablet 3  . Cholecalciferol (VITAMIN D3) 1000 UNITS tablet Take 1,000 Units by mouth daily.      Marland Kitchen ibuprofen (ADVIL,MOTRIN) 400 MG tablet Take 1 tablet (400 mg total) by mouth every 8 (eight) hours as needed. for pain 90 tablet 0  . Multiple Vitamins-Minerals (WOMENS MULTIVITAMIN PLUS PO) daily.    . Na Sulfate-K Sulfate-Mg Sulf (SUPREP BOWEL PREP) SOLN Take 1 kit by mouth once. suprep as directed. No substitutions 354 mL 0  . pantoprazole (PROTONIX) 40 MG tablet Take 1 tablet (40 mg total) by mouth daily. 90 tablet 3  . triamterene-hydrochlorothiazide (MAXZIDE-25) 37.5-25 MG tablet Take 1 tablet by mouth daily. 90 tablet 3   No facility-administered medications prior to visit.     ROS Review of Systems  Constitutional: Negative for activity change, appetite change, chills, fatigue and unexpected weight change.  HENT: Negative for congestion, mouth sores, nosebleeds and sinus pressure.   Eyes: Negative for visual disturbance.  Respiratory: Negative for cough and chest tightness.   Gastrointestinal: Negative for abdominal pain and nausea.  Genitourinary: Negative for difficulty urinating, frequency and vaginal pain.  Musculoskeletal: Negative for back pain and gait problem.  Skin: Negative for pallor and rash.  Neurological: Negative for dizziness, tremors, weakness, numbness and headaches.  Psychiatric/Behavioral: Negative for confusion and sleep disturbance.    Objective:  BP 126/78 (BP Location: Left Arm, Patient Position:  Sitting, Cuff Size: Normal)   Pulse 97   Temp 97.5 F (36.4 C) (Oral)   Ht 5' 1"  (1.549 m)   Wt 175 lb 1.3 oz (79.4 kg)   SpO2 100%   BMI 33.08 kg/m   BP Readings from Last 3 Encounters:  12/17/16 126/78  11/19/16 138/88  07/16/16 118/70    Wt Readings from Last 3 Encounters:  12/17/16 175 lb 1.3 oz (79.4 kg)  11/19/16 175 lb (79.4 kg)  07/16/16 175 lb (79.4 kg)    Physical Exam  Constitutional: She appears well-developed. No distress.  HENT:  Head: Normocephalic.  Right Ear: External ear normal.  Left Ear: External ear normal.  Nose: Nose normal.  Mouth/Throat: Oropharynx is clear and moist.  Eyes: Conjunctivae are normal. Pupils are equal, round, and reactive to light. Right eye exhibits no discharge. Left eye exhibits no discharge.  Neck: Normal range of motion. Neck supple. No JVD present. No tracheal deviation present. No thyromegaly present.  Cardiovascular: Normal rate, regular rhythm and normal heart sounds.   Pulmonary/Chest: No stridor. No respiratory distress. She has no wheezes.  Abdominal: Soft. Bowel sounds are normal. She exhibits no distension and no mass. There is no tenderness. There is no rebound and no guarding.  Musculoskeletal: She exhibits tenderness. She exhibits no edema.  Lymphadenopathy:    She has no cervical adenopathy.  Neurological: She displays normal reflexes. No cranial nerve deficit. She exhibits normal muscle tone. Coordination normal.  Skin: No rash noted. No erythema.  Psychiatric: She has a normal mood and  affect. Her behavior is normal. Judgment and thought content normal.  nose bridge is tender  Lab Results  Component Value Date   WBC 6.4 04/16/2016   HGB 12.2 04/16/2016   HCT 38.3 04/16/2016   PLT 359.0 04/16/2016   GLUCOSE 116 (H) 04/16/2016   CHOL 230 (H) 04/16/2016   TRIG 167.0 (H) 04/16/2016   HDL 36.00 (L) 04/16/2016   LDLDIRECT 120.0 05/13/2015   LDLCALC 161 (H) 04/16/2016   ALT 36 (H) 04/16/2016   AST 24  04/16/2016   NA 138 04/16/2016   K 3.8 04/16/2016   CL 104 04/16/2016   CREATININE 0.93 04/16/2016   BUN 16 04/16/2016   CO2 26 04/16/2016   TSH 1.79 04/16/2016   INR 1.0 04/19/2007   HGBA1C 6.1 02/06/2016   MICROALBUR 1.5 02/06/2016    Dg Nasal Bones  Result Date: 11/25/2016 CLINICAL DATA:  Follow work.  Epistaxis. EXAM: NASAL BONES - 3+ VIEW COMPARISON:  None. FINDINGS: Bilateral anterior depressed nasal bone fracture with 1 mm depression, with overlying mild soft tissue swelling. No additional fractures. No suspicious focal osseous lesions. Mild swelling of the nasal septal soft tissues. IMPRESSION: Depressed anterior nasal bone fractures bilaterally. Mild swelling of the nasal septal soft tissues. Electronically Signed   By: Ilona Sorrel M.D.   On: 11/25/2016 08:10    Assessment & Plan:   There are no diagnoses linked to this encounter. I am having [name] maintain her cholecalciferol, cetirizine, aspirin, Na Sulfate-K Sulfate-Mg Sulf, Multiple Vitamins-Minerals (WOMENS MULTIVITAMIN PLUS PO), triamterene-hydrochlorothiazide, amLODipine, pantoprazole, and ibuprofen.  No orders of the defined types were placed in this encounter.    Follow-up: No Follow-up on file.  Walker Kehr, MD

## 2016-12-17 NOTE — Progress Notes (Signed)
Pre visit review using our clinic review tool, if applicable. No additional management support is needed unless otherwise documented below in the visit note. 

## 2016-12-17 NOTE — Assessment & Plan Note (Signed)
Nose bones X ray IMPRESSION: Depressed anterior nasal bone fractures bilaterally.  Mild swelling of the nasal septal soft tissues.   Electronically Signed   By: Ilona Sorrel M.D.   On: 11/25/2016 08:10

## 2016-12-17 NOTE — Assessment & Plan Note (Addendum)
Nose fx f/u w/ENT Nose bones X ray IMPRESSION: Depressed anterior nasal bone fractures bilaterally.  Mild swelling of the nasal septal soft tissues.   Electronically Signed   By: Ilona Sorrel M.D.   On: 11/25/2016 08:10

## 2017-01-19 ENCOUNTER — Telehealth: Payer: Self-pay | Admitting: *Deleted

## 2017-01-19 NOTE — Telephone Encounter (Signed)
OK to fill this/these prescription(s) with additional refills x1  Thank you!  

## 2017-01-19 NOTE — Telephone Encounter (Signed)
Rec'd call pt states she need refill on her Ibuprofen 90 day supply sent to [facility]. pls advise...Monica Horn

## 2017-01-20 MED ORDER — IBUPROFEN 400 MG PO TABS: 400.0000 mg | ORAL_TABLET | Freq: Three times a day (TID) | ORAL | 1 refills | Status: DC | PRN

## 2017-01-20 NOTE — Telephone Encounter (Signed)
Sent rx to [facility],,,/lmb

## 2017-02-25 ENCOUNTER — Telehealth: Payer: Self-pay | Admitting: Internal Medicine

## 2017-02-25 MED ORDER — TRIAMCINOLONE ACETONIDE 0.5 % EX CREA: 1.0000 "application " | TOPICAL_CREAM | Freq: Three times a day (TID) | CUTANEOUS | 1 refills | Status: AC

## 2017-02-25 NOTE — Telephone Encounter (Signed)
Please advise 

## 2017-02-25 NOTE — Telephone Encounter (Signed)
LM notifying pt

## 2017-02-25 NOTE — Telephone Encounter (Signed)
Kenalog cream Rx emailed Thx

## 2017-02-25 NOTE — Telephone Encounter (Signed)
Patient states she has broke out with poison oak on her forehead and states this happens every year.  States Dr. Camila Li usually prescribes her a cream for this to her pharmacy.  Does not know the name of the cream.  States she would like a script sent to [facility].

## 2017-03-28 ENCOUNTER — Other Ambulatory Visit: Payer: Self-pay | Admitting: Internal Medicine

## 2017-03-29 ENCOUNTER — Other Ambulatory Visit: Payer: Self-pay | Admitting: General Practice

## 2017-03-29 MED ORDER — PANTOPRAZOLE SODIUM 40 MG PO TBEC: 40.0000 mg | DELAYED_RELEASE_TABLET | Freq: Every day | ORAL | 0 refills | Status: DC

## 2017-04-07 ENCOUNTER — Telehealth: Payer: Self-pay | Admitting: Internal Medicine

## 2017-04-07 NOTE — Telephone Encounter (Signed)
Patient figured out what she needed on her own and did not need my assistance

## 2017-04-07 NOTE — Telephone Encounter (Signed)
Patient called in and wanted to speak with the nurse. She would not inform me of anything of what it was about. She only said it was something with medications. She only wanted to speak with the nurse. Please follow up with patient. Thank you.

## 2017-04-13 ENCOUNTER — Other Ambulatory Visit: Payer: Self-pay | Admitting: Internal Medicine

## 2017-04-13 DIAGNOSIS — Z1231 Encounter for screening mammogram for malignant neoplasm of breast: Secondary | ICD-10-CM

## 2017-05-04 ENCOUNTER — Other Ambulatory Visit: Payer: Self-pay

## 2017-05-04 MED ORDER — AMLODIPINE BESYLATE 5 MG PO TABS: 5.0000 mg | ORAL_TABLET | Freq: Every day | ORAL | 0 refills | Status: DC

## 2017-05-16 ENCOUNTER — Ambulatory Visit
Admission: RE | Admit: 2017-05-16 | Discharge: 2017-05-16 | Disposition: A | Discharge status: Home / Self Care | Payer: Medicare HMO | Source: Ambulatory Visit | Attending: Internal Medicine | Admitting: Internal Medicine

## 2017-05-16 DIAGNOSIS — Z1231 Encounter for screening mammogram for malignant neoplasm of breast: Secondary | ICD-10-CM

## 2017-05-21 ENCOUNTER — Other Ambulatory Visit: Payer: Self-pay | Admitting: Internal Medicine

## 2017-06-20 ENCOUNTER — Ambulatory Visit: Payer: Medicare HMO | Admitting: Internal Medicine

## 2017-06-20 ENCOUNTER — Encounter: Payer: Self-pay | Admitting: Internal Medicine

## 2017-06-20 VITALS — BP 124/68 | HR 87 | Temp 97.8°F | Ht 61.0 in | Wt 170.0 lb

## 2017-06-20 DIAGNOSIS — I10 Essential (primary) hypertension: Secondary | ICD-10-CM | POA: Diagnosis not present

## 2017-06-20 DIAGNOSIS — E785 Hyperlipidemia, unspecified: Secondary | ICD-10-CM | POA: Diagnosis not present

## 2017-06-20 DIAGNOSIS — J301 Allergic rhinitis due to pollen: Secondary | ICD-10-CM

## 2017-06-20 DIAGNOSIS — Z23 Encounter for immunization: Secondary | ICD-10-CM | POA: Diagnosis not present

## 2017-06-20 NOTE — Assessment & Plan Note (Signed)
Labs

## 2017-06-20 NOTE — Progress Notes (Signed)
Subjective:  Patient ID: Monica Horn, female    DOB: Jan 23, 1946  Age: 71 y.o. MRN: [id number]  CC: No chief complaint on file.   HPI Monica Horn presents for DM, HTN, GERD f/u  Outpatient Medications Prior to Visit  Medication Sig Dispense Refill  . amLODipine (NORVASC) 5 MG tablet Take 1 tablet (5 mg total) by mouth daily. Patient needs office visit before refills will be given 90 tablet 0  . aspirin 81 MG tablet     . cetirizine (ZYRTEC) 10 MG tablet Take 1 tablet (10 mg total) by mouth daily as needed. 90 tablet 3  . Cholecalciferol (VITAMIN D3) 1000 UNITS tablet Take 1,000 Units by mouth daily.      Marland Kitchen ibuprofen (ADVIL,MOTRIN) 400 MG tablet TAKE 1 TABLET EVERY 8 HOURS AS NEEDED FOR PAIN 90 tablet 1  . Multiple Vitamins-Minerals (WOMENS MULTIVITAMIN PLUS PO) daily.    . Na Sulfate-K Sulfate-Mg Sulf (SUPREP BOWEL PREP) SOLN Take 1 kit by mouth once. suprep as directed. No substitutions 354 mL 0  . pantoprazole (PROTONIX) 40 MG tablet Take 1 tablet (40 mg total) by mouth daily. 90 tablet 0  . triamcinolone cream (KENALOG) 0.5 % Apply 1 application topically 3 (three) times daily. 60 g 1  . triamterene-hydrochlorothiazide (MAXZIDE-25) 37.5-25 MG tablet Take 1 tablet by mouth daily. 90 tablet 3   No facility-administered medications prior to visit.     ROS Review of Systems  Constitutional: Negative for activity change, appetite change, chills, fatigue and unexpected weight change.  HENT: Negative for congestion, mouth sores and sinus pressure.   Eyes: Negative for visual disturbance.  Respiratory: Negative for cough and chest tightness.   Gastrointestinal: Negative for abdominal pain and nausea.  Genitourinary: Negative for difficulty urinating, frequency and vaginal pain.  Musculoskeletal: Negative for back pain and gait problem.  Skin: Negative for pallor and rash.  Neurological: Negative for dizziness, tremors, weakness, numbness and headaches.  Psychiatric/Behavioral:  Negative for confusion and sleep disturbance. The patient is not nervous/anxious.     Objective:  BP 124/68 (BP Location: Left Arm, Patient Position: Sitting, Cuff Size: Large)   Pulse 87   Temp 97.8 F (36.6 C) (Oral)   Ht _0  (1.549 m)   Wt 170 lb (77.1 kg)   SpO2 98%   BMI 32.12 kg/m   BP Readings from Last 3 Encounters:  06/20/17 124/68  12/17/16 126/78  11/19/16 138/88    Wt Readings from Last 3 Encounters:  06/20/17 170 lb (77.1 kg)  12/17/16 175 lb 1.3 oz (79.4 kg)  11/19/16 175 lb (79.4 kg)    Physical Exam  Constitutional: She appears well-developed. No distress.  HENT:  Head: Normocephalic.  Right Ear: External ear normal.  Left Ear: External ear normal.  Nose: Nose normal.  Mouth/Throat: Oropharynx is clear and moist.  Eyes: Conjunctivae are normal. Pupils are equal, round, and reactive to light. Right eye exhibits no discharge. Left eye exhibits no discharge.  Neck: Normal range of motion. Neck supple. No JVD present. No tracheal deviation present. No thyromegaly present.  Cardiovascular: Normal rate, regular rhythm and normal heart sounds.  Pulmonary/Chest: No stridor. No respiratory distress. She has no wheezes.  Abdominal: Soft. Bowel sounds are normal. She exhibits no distension and no mass. There is no tenderness. There is no rebound and no guarding.  Musculoskeletal: She exhibits no edema or tenderness.  Lymphadenopathy:    She has no cervical adenopathy.  Neurological: She displays normal reflexes. No cranial  nerve deficit. She exhibits normal muscle tone. Coordination normal.  Skin: No rash noted. No erythema.  Psychiatric: She has a normal mood and affect. Her behavior is normal. Judgment and thought content normal.    Lab Results  Component Value Date   WBC 6.4 04/16/2016   HGB 12.2 04/16/2016   HCT 38.3 04/16/2016   PLT 359.0 04/16/2016   GLUCOSE 116 (H) 04/16/2016   CHOL 230 (H) 04/16/2016   TRIG 167.0 (H) 04/16/2016   HDL 36.00 (L)  04/16/2016   LDLDIRECT 120.0 05/13/2015   LDLCALC 161 (H) 04/16/2016   ALT 36 (H) 04/16/2016   AST 24 04/16/2016   NA 138 04/16/2016   K 3.8 04/16/2016   CL 104 04/16/2016   CREATININE 0.93 04/16/2016   BUN 16 04/16/2016   CO2 26 04/16/2016   TSH 1.79 04/16/2016   INR 1.0 04/19/2007   HGBA1C 6.1 02/06/2016   MICROALBUR 1.5 02/06/2016    Mm Digital Screening Bilateral  Result Date: 05/17/2017 CLINICAL DATA:  Screening. EXAM: DIGITAL SCREENING BILATERAL MAMMOGRAM WITH CAD COMPARISON:  Previous exam(s). ACR Breast Density Category b: There are scattered areas of fibroglandular density. FINDINGS: There are no findings suspicious for malignancy. Images were processed with CAD. IMPRESSION: No mammographic evidence of malignancy. A result letter of this screening mammogram will be mailed directly to the patient. RECOMMENDATION: Screening mammogram in one year. (Code:SM-B-01Y) BI-RADS CATEGORY  1: Negative. Electronically Signed   By: Lajean Manes M.D.   On: 05/17/2017 11:49    Assessment & Plan:   There are no diagnoses linked to this encounter. I am having Monica Horn maintain her cholecalciferol, cetirizine, aspirin, Na Sulfate-K Sulfate-Mg Sulf, Multiple Vitamins-Minerals (WOMENS MULTIVITAMIN PLUS PO), triamterene-hydrochlorothiazide, triamcinolone cream, pantoprazole, amLODipine, and ibuprofen.  No orders of the defined types were placed in this encounter.    Follow-up: No Follow-up on file.  Walker Kehr, MD

## 2017-06-20 NOTE — Assessment & Plan Note (Addendum)
Zyrtec po

## 2017-06-20 NOTE — Assessment & Plan Note (Signed)
  On diet  

## 2017-06-21 NOTE — Addendum Note (Signed)
Addended by: Karren Cobble on: 06/21/2017 12:56 PM   Modules accepted: Orders

## 2017-06-24 ENCOUNTER — Other Ambulatory Visit (INDEPENDENT_AMBULATORY_CARE_PROVIDER_SITE_OTHER): Payer: Medicare HMO

## 2017-06-24 DIAGNOSIS — E785 Hyperlipidemia, unspecified: Secondary | ICD-10-CM | POA: Diagnosis not present

## 2017-06-24 DIAGNOSIS — R600 Localized edema: Secondary | ICD-10-CM

## 2017-06-24 DIAGNOSIS — E119 Type 2 diabetes mellitus without complications: Secondary | ICD-10-CM | POA: Diagnosis not present

## 2017-06-24 LAB — LIPID PANEL
CHOLESTEROL: 192 mg/dL (ref 0–200)
HDL: 37 mg/dL — AB (ref >39.00)
LDL Cholesterol: 130 mg/dL — ABNORMAL HIGH (ref 0–99)
NonHDL: 155.09
TRIGLYCERIDES: 126 mg/dL (ref 0.0–149.0)
Total CHOL/HDL Ratio: 5
VLDL: 25.2 mg/dL (ref 0.0–40.0)

## 2017-06-24 LAB — HEPATIC FUNCTION PANEL
ALT: 22 U/L (ref 0–35)
AST: 20 U/L (ref 0–37)
Albumin: 4.1 g/dL (ref 3.5–5.2)
Alkaline Phosphatase: 121 U/L — ABNORMAL HIGH (ref 39–117)
BILIRUBIN TOTAL: 0.4 mg/dL (ref 0.2–1.2)
Bilirubin, Direct: 0.1 mg/dL (ref 0.0–0.3)
TOTAL PROTEIN: 7.3 g/dL (ref 6.0–8.3)

## 2017-06-24 LAB — URINALYSIS, ROUTINE W REFLEX MICROSCOPIC
BILIRUBIN URINE: NEGATIVE
Hgb urine dipstick: NEGATIVE
Ketones, ur: NEGATIVE
NITRITE: NEGATIVE
PH: 6 (ref 5.0–8.0)
SPECIFIC GRAVITY, URINE: 1.02 (ref 1.000–1.030)
Total Protein, Urine: NEGATIVE
Urine Glucose: NEGATIVE
Urobilinogen, UA: 0.2 (ref 0.0–1.0)

## 2017-06-24 LAB — BASIC METABOLIC PANEL
BUN: 15 mg/dL (ref 6–23)
CHLORIDE: 104 meq/L (ref 96–112)
CO2: 28 meq/L (ref 19–32)
CREATININE: 0.95 mg/dL (ref 0.40–1.20)
Calcium: 9.6 mg/dL (ref 8.4–10.5)
GFR: 74.66 mL/min (ref >60.00)
Glucose, Bld: 108 mg/dL — ABNORMAL HIGH (ref 70–99)
POTASSIUM: 3.9 meq/L (ref 3.5–5.1)
Sodium: 139 mEq/L (ref 135–145)

## 2017-06-24 LAB — HEMOGLOBIN A1C: HEMOGLOBIN A1C: 6.3 % (ref 4.6–6.5)

## 2017-06-24 LAB — TSH: TSH: 2.06 u[IU]/mL (ref 0.35–4.50)

## 2017-07-15 DIAGNOSIS — H04123 Dry eye syndrome of bilateral lacrimal glands: Secondary | ICD-10-CM | POA: Diagnosis not present

## 2017-07-15 DIAGNOSIS — H40013 Open angle with borderline findings, low risk, bilateral: Secondary | ICD-10-CM | POA: Diagnosis not present

## 2017-07-15 DIAGNOSIS — H10413 Chronic giant papillary conjunctivitis, bilateral: Secondary | ICD-10-CM | POA: Diagnosis not present

## 2017-07-15 DIAGNOSIS — H524 Presbyopia: Secondary | ICD-10-CM | POA: Diagnosis not present

## 2017-07-15 DIAGNOSIS — H25813 Combined forms of age-related cataract, bilateral: Secondary | ICD-10-CM | POA: Diagnosis not present

## 2017-07-15 DIAGNOSIS — H5203 Hypermetropia, bilateral: Secondary | ICD-10-CM | POA: Diagnosis not present

## 2017-08-03 DIAGNOSIS — Z6832 Body mass index (BMI) 32.0-32.9, adult: Secondary | ICD-10-CM | POA: Diagnosis not present

## 2017-08-03 DIAGNOSIS — Z01419 Encounter for gynecological examination (general) (routine) without abnormal findings: Secondary | ICD-10-CM | POA: Diagnosis not present

## 2017-08-11 ENCOUNTER — Other Ambulatory Visit: Payer: Self-pay | Admitting: Internal Medicine

## 2017-09-03 ENCOUNTER — Other Ambulatory Visit: Payer: Self-pay | Admitting: Internal Medicine

## 2017-09-08 NOTE — Progress Notes (Addendum)
Subjective:   Monica Horn is a 72 y.o. female who presents for Medicare Annual (Subsequent) preventive examination.  Review of Systems:  No ROS.  Medicare Wellness Visit. Additional risk factors are reflected in the social history.  Cardiac Risk Factors include: advanced age (>51mn, >>62women);diabetes mellitus;dyslipidemia;hypertension Sleep patterns: feels rested on waking, gets up 1-2 times nightly to void and sleeps 7-8 hours nightly.    Home Safety/Smoke Alarms: Feels safe in home. Smoke alarms in place.  Living environment; residence and Firearm Safety: 1-story house/ trailer, no firearms. Lives with husband, no needs for DME, good support system Seat Belt Safety/Bike Helmet: Wears seat belt.     Objective:     Vitals: BP 124/72   Pulse 93   Resp 18   Ht 5' 1"  (1.549 m)   Wt 170 lb (77.1 kg)   SpO2 100%   BMI 32.12 kg/m   Body mass index is 32.12 kg/m.  Advanced Directives 09/09/2017 01/16/2015 01/03/2015  Does Patient Have a Medical Advance Directive? No No No  Would patient like information on creating a medical advance directive? Yes (ED - Information included in AVS) No - patient declined information -    Tobacco Social History   Tobacco Use  Smoking Status Former Smoker  Smokeless Tobacco Never Used     Counseling given: Not Answered  Past Medical History:  Diagnosis Date  . Allergy   . Arthritis   . Blood transfusion   . Blood transfusion without reported diagnosis   . Diabetes mellitus    PT. DENIES- no medicines for this   . Fatty infiltration of liver   . GERD (gastroesophageal reflux disease)   . Hydradenitis 01/2009   Right S aureus  . Hyperlipidemia   . Hypertension   . Internal hemorrhoids   . Tubulovillous adenoma polyp of colon 03/2007   w/ focal high grade dysplasia, no carcinoma   Past Surgical History:  Procedure Laterality Date  . CARPAL TUNNEL RELEASE     Lt  wrist, rt wrist 2014  . COLONOSCOPY    . POLYPECTOMY    . TUBAL  LIGATION     Family History  Problem Relation Age of Onset  . Stroke Mother   . Hypertension Mother   . Cancer Father        ? liver ca  . Alcohol abuse Father   . Hypertension Other   . Stroke Other   . Colon cancer Neg Hx   . Colon polyps Neg Hx   . Rectal cancer Neg Hx   . Stomach cancer Neg Hx    Social History   Socioeconomic History  . Marital status: Married    Spouse name: None  . Number of children: None  . Years of education: None  . Highest education level: None  Social Needs  . Financial resource strain: Not hard at all  . Food insecurity - worry: Never true  . Food insecurity - inability: Never true  . Transportation needs - medical: No  . Transportation needs - non-medical: No  Occupational History  . None  Tobacco Use  . Smoking status: Former SResearch scientist (life sciences) . Smokeless tobacco: Never Used  Substance and Sexual Activity  . Alcohol use: No  . Drug use: No  . Sexual activity: Yes  Other Topics Concern  . None  Social History Narrative  . None    Outpatient Encounter Medications as of 09/09/2017  Medication Sig  . amLODipine (NORVASC) 5 MG tablet Take  1 tablet (5 mg total) by mouth daily.  Marland Kitchen aspirin 81 MG tablet   . cetirizine (ZYRTEC) 10 MG tablet Take 1 tablet (10 mg total) by mouth daily as needed.  . Cholecalciferol (VITAMIN D3) 1000 UNITS tablet Take 1,000 Units by mouth daily.    Marland Kitchen ibuprofen (ADVIL,MOTRIN) 400 MG tablet TAKE 1 TABLET EVERY 8 HOURS AS NEEDED FOR PAIN  . Multiple Vitamins-Minerals (WOMENS MULTIVITAMIN PLUS PO) daily.  . pantoprazole (PROTONIX) 40 MG tablet TAKE 1 TABLET EVERY DAY; NEED ANNUAL PHYSICAL  . triamcinolone cream (KENALOG) 0.5 % Apply 1 application topically 3 (three) times daily.  . [DISCONTINUED] Na Sulfate-K Sulfate-Mg Sulf (SUPREP BOWEL PREP) SOLN Take 1 kit by mouth once. suprep as directed. No substitutions (Patient not taking: Reported on 09/09/2017)  . [DISCONTINUED] triamterene-hydrochlorothiazide (MAXZIDE-25) 37.5-25  MG tablet Take 1 tablet by mouth daily.   No facility-administered encounter medications on file as of 09/09/2017.     Activities of Daily Living In your present state of health, do you have any difficulty performing the following activities: 09/09/2017  Hearing? N  Vision? N  Difficulty concentrating or making decisions? N  Walking or climbing stairs? N  Dressing or bathing? N  Doing errands, shopping? N  Preparing Food and eating ? N  Using the Toilet? N  In the past six months, have you accidently leaked urine? N  Do you have problems with loss of bowel control? N  Managing your Medications? N  Managing your Finances? N  Housekeeping or managing your Housekeeping? N  Some recent data might be hidden    Patient Care Team: Plotnikov, Evie Lacks, MD as PCP - General Ladene Artist, MD (Gastroenterology) Calvert Cantor, MD as Consulting Physician (Ophthalmology)    Assessment:   This is a routine wellness examination for Monica Horn. Physical assessment deferred to PCP.   Exercise Activities and Dietary recommendations Current Exercise Habits: Structured exercise class, Type of exercise: walking;calisthenics;strength training/weights, Intensity: Mild, Exercise limited by: orthopedic condition(s) Diet (meal preparation, eat out, water intake, caffeinated beverages, dairy products, fruits and vegetables): in general, a "healthy" diet  , well balanced   Reviewed heart healthy and diabetic diet, encouraged patient to increase daily water intake.  Goals    . Patient Stated     Continue to stimulate my mind to prevent dementia and challenge, write a book about ministry.  Be socially active as possible. Continue to [place], love family and life.       Fall Risk Fall Risk  09/09/2017 12/17/2016 02/06/2016 11/13/2014  Falls in the past year? No Yes No No  Number falls in past yr: - 1 - -  Injury with Fall? - Yes - -    Depression Screen PHQ 2/9 Scores 09/09/2017 12/17/2016 02/06/2016  11/13/2014  PHQ - 2 Score 0 0 0 0  PHQ- 9 Score 0 - - -     Cognitive Function MMSE - Mini Mental State Exam 09/09/2017  Orientation to time 5  Orientation to Place 5  Registration 3  Attention/ Calculation 5  Recall 2  Language- name 2 objects 2  Language- repeat 1  Language- follow 3 step command 3  Language- read & follow direction 1  Write a sentence 1  Copy design 1  Total score 29        Immunization History  Administered Date(s) Administered  . Influenza Split 06/13/2012  . Influenza Whole 07/24/2002, 05/22/2008, 06/06/2009, 07/27/2010  . Influenza, High Dose Seasonal PF 06/20/2017  . Influenza,inj,Quad  PF,6+ Mos 08/03/2013, 08/02/2014, 06/27/2015, 05/04/2016  . Pneumococcal Conjugate-13 10/02/2015  . Pneumococcal Polysaccharide-23 06/27/2015  . Td 01/31/2009   Screening Tests Health Maintenance  Topic Date Due  . OPHTHALMOLOGY EXAM  11/21/1956  . DEXA SCAN  11/22/2011  . FOOT EXAM  02/05/2017  . URINE MICROALBUMIN  02/05/2017  . HEMOGLOBIN A1C  12/22/2017  . COLONOSCOPY  01/15/2018  . TETANUS/TDAP  02/01/2019  . MAMMOGRAM  05/17/2019  . INFLUENZA VACCINE  Completed  . Hepatitis C Screening  Completed  . PNA vac Low Risk Adult  Completed      Plan:    [email or web address] or down load app on smart phone [employer]renice [employer] website lists multiple social resources for individuals such as: food, health, money, house hold goods, transit, medical supplies, job training and legal services.  Please ask Dr. Bing Plume to send eye exam results to Dr. Alain Marion  Continue doing brain stimulating activities (puzzles, reading, adult coloring books, staying active) to keep memory sharp.   Continue to eat heart healthy diet (full of fruits, vegetables, whole grains, lean protein, water--limit salt, fat, and sugar intake) and increase physical activity as tolerated.  I have personally reviewed and noted the following in the patient's chart:   . Medical and social history . Use of  alcohol, tobacco or illicit drugs  . Current medications and supplements . Functional ability and status . Nutritional status . Physical activity . Advanced directives . List of other physicians . Vitals . Screenings to include cognitive, depression, and falls . Referrals and appointments  In addition, I have reviewed and discussed with patient certain preventive protocols, quality metrics, and best practice recommendations. A written personalized care plan for preventive services as well as general preventive health recommendations were provided to patient.     Michiel Cowboy, RN  09/09/2017    Medical screening examination/treatment/procedure(s) were performed by non-physician practitioner and as supervising physician I was immediately available for consultation/collaboration. I agree with above. Lew Dawes, MD

## 2017-09-09 ENCOUNTER — Ambulatory Visit (INDEPENDENT_AMBULATORY_CARE_PROVIDER_SITE_OTHER): Payer: Medicare HMO | Admitting: *Deleted

## 2017-09-09 VITALS — BP 124/72 | HR 93 | Resp 18 | Ht 61.0 in | Wt 170.0 lb

## 2017-09-09 DIAGNOSIS — E2839 Other primary ovarian failure: Secondary | ICD-10-CM | POA: Diagnosis not present

## 2017-09-09 DIAGNOSIS — E119 Type 2 diabetes mellitus without complications: Secondary | ICD-10-CM

## 2017-09-09 DIAGNOSIS — Z0000 Encounter for general adult medical examination without abnormal findings: Secondary | ICD-10-CM

## 2017-09-09 NOTE — Patient Instructions (Addendum)
[email or web address] or down load app on smart phone [employer] website lists multiple social resources for individuals such as: food, health, money, house hold goods, transit, medical supplies, job training and legal services.  Please ask Dr. Bing Plume to send eye exam results to Dr. Alain Marion  Continue doing brain stimulating activities (puzzles, reading, adult coloring books, staying active) to keep memory sharp.   Continue to eat heart healthy diet (full of fruits, vegetables, whole grains, lean protein, water--limit salt, fat, and sugar intake) and increase physical activity as tolerated.   Ms. Kujala , Thank you for taking time to come for your Medicare Wellness Visit. I appreciate your ongoing commitment to your health goals. Please review the following plan we discussed and let me know if I can assist you in the future.   These are the goals we discussed: Goals    . Patient Stated     Continue to stimulate my mind to prevent dementia and challenge, write a book about ministry.  Be socially active as possible. Continue to [place], love family and life.       This is a list of the screening recommended for you and due dates:  Health Maintenance  Topic Date Due  . Eye exam for diabetics  11/21/1956  . DEXA scan (bone density measurement)  11/22/2011  . Complete foot exam   02/05/2017  . Urine Protein Check  02/05/2017  . Hemoglobin A1C  12/22/2017  . Colon Cancer Screening  01/15/2018  . Tetanus Vaccine  02/01/2019  . Mammogram  05/17/2019  . Flu Shot  Completed  .  Hepatitis C: One time screening is recommended by Center for Disease Control  (CDC) for  adults born from [birth year] through 1964.   Completed  . Pneumonia vaccines  Completed

## 2017-09-23 ENCOUNTER — Other Ambulatory Visit: Payer: Medicare HMO

## 2017-10-07 ENCOUNTER — Inpatient Hospital Stay: Admission: RE | Admit: 2017-10-07 | Payer: Medicare HMO | Source: Ambulatory Visit

## 2017-10-07 ENCOUNTER — Ambulatory Visit (INDEPENDENT_AMBULATORY_CARE_PROVIDER_SITE_OTHER)
Admission: RE | Admit: 2017-10-07 | Discharge: 2017-10-07 | Disposition: A | Discharge status: Home / Self Care | Payer: Medicare HMO | Source: Ambulatory Visit | Attending: Internal Medicine | Admitting: Internal Medicine

## 2017-10-07 DIAGNOSIS — E2839 Other primary ovarian failure: Secondary | ICD-10-CM | POA: Diagnosis not present

## 2017-10-14 ENCOUNTER — Ambulatory Visit (INDEPENDENT_AMBULATORY_CARE_PROVIDER_SITE_OTHER): Payer: Medicare HMO | Admitting: Internal Medicine

## 2017-10-14 ENCOUNTER — Encounter: Payer: Self-pay | Admitting: Internal Medicine

## 2017-10-14 VITALS — BP 122/74 | HR 85 | Temp 98.2°F | Ht 61.0 in | Wt 168.0 lb

## 2017-10-14 DIAGNOSIS — E785 Hyperlipidemia, unspecified: Secondary | ICD-10-CM | POA: Diagnosis not present

## 2017-10-14 DIAGNOSIS — I10 Essential (primary) hypertension: Secondary | ICD-10-CM

## 2017-10-14 DIAGNOSIS — E119 Type 2 diabetes mellitus without complications: Secondary | ICD-10-CM | POA: Diagnosis not present

## 2017-10-14 DIAGNOSIS — H6123 Impacted cerumen, bilateral: Secondary | ICD-10-CM

## 2017-10-14 MED ORDER — IBUPROFEN 400 MG PO TABS: 400.0000 mg | ORAL_TABLET | Freq: Three times a day (TID) | ORAL | 1 refills | Status: DC | PRN

## 2017-10-14 NOTE — Assessment & Plan Note (Signed)
  On diet  

## 2017-10-14 NOTE — Assessment & Plan Note (Signed)
Labs

## 2017-10-14 NOTE — Progress Notes (Signed)
Subjective:  Patient ID: Monica Horn, female    DOB: 1946/05/21  Age: 72 y.o. MRN: [id number]  CC: No chief complaint on file.   HPI Monica Horn presents for HTN, GERD, hearing loss  Outpatient Medications Prior to Visit  Medication Sig Dispense Refill  . amLODipine (NORVASC) 5 MG tablet Take 1 tablet (5 mg total) by mouth daily. 90 tablet 1  . aspirin 81 MG tablet     . cetirizine (ZYRTEC) 10 MG tablet Take 1 tablet (10 mg total) by mouth daily as needed. 90 tablet 3  . Cholecalciferol (VITAMIN D3) 1000 UNITS tablet Take 1,000 Units by mouth daily.      Marland Kitchen ibuprofen (ADVIL,MOTRIN) 400 MG tablet TAKE 1 TABLET EVERY 8 HOURS AS NEEDED FOR PAIN 90 tablet 1  . Multiple Vitamins-Minerals (WOMENS MULTIVITAMIN PLUS PO) daily.    . pantoprazole (PROTONIX) 40 MG tablet TAKE 1 TABLET EVERY DAY; NEED ANNUAL PHYSICAL 90 tablet 1  . triamcinolone cream (KENALOG) 0.5 % Apply 1 application topically 3 (three) times daily. 60 g 1   No facility-administered medications prior to visit.     ROS Review of Systems  Constitutional: Negative for activity change, appetite change, chills, fatigue and unexpected weight change.  HENT: Negative for congestion, mouth sores and sinus pressure.   Eyes: Negative for visual disturbance.  Respiratory: Negative for cough and chest tightness.   Gastrointestinal: Negative for abdominal pain and nausea.  Genitourinary: Negative for difficulty urinating, frequency and vaginal pain.  Musculoskeletal: Negative for back pain and gait problem.  Skin: Negative for pallor and rash.  Neurological: Negative for dizziness, tremors, weakness, numbness and headaches.  Psychiatric/Behavioral: Negative for confusion and sleep disturbance.    Objective:  BP 122/74 (BP Location: Left Arm, Patient Position: Sitting, Cuff Size: Large)   Pulse 85   Temp 98.2 F (36.8 C) (Oral)   Ht 5\' 1"  (1.549 m)   Wt 168 lb (76.2 kg)   SpO2 98%   BMI 31.74 kg/m   BP Readings from  Last 3 Encounters:  10/14/17 122/74  09/09/17 124/72  06/20/17 124/68    Wt Readings from Last 3 Encounters:  10/14/17 168 lb (76.2 kg)  09/09/17 170 lb (77.1 kg)  06/20/17 170 lb (77.1 kg)    Physical Exam  Constitutional: She appears well-developed. No distress.  HENT:  Head: Normocephalic.  Right Ear: External ear normal.  Left Ear: External ear normal.  Nose: Nose normal.  Mouth/Throat: Oropharynx is clear and moist.  Eyes: Conjunctivae are normal. Pupils are equal, round, and reactive to light. Right eye exhibits no discharge. Left eye exhibits no discharge.  Neck: Normal range of motion. Neck supple. No JVD present. No tracheal deviation present. No thyromegaly present.  Cardiovascular: Normal rate, regular rhythm and normal heart sounds.  Pulmonary/Chest: No stridor. No respiratory distress. She has no wheezes.  Abdominal: Soft. Bowel sounds are normal. She exhibits no distension and no mass. There is no tenderness. There is no rebound and no guarding.  Musculoskeletal: She exhibits tenderness. She exhibits no edema.  Lymphadenopathy:    She has no cervical adenopathy.  Neurological: She displays normal reflexes. No cranial nerve deficit. She exhibits normal muscle tone. Coordination normal.  Skin: No rash noted. No erythema.  Psychiatric: She has a normal mood and affect. Her behavior is normal. Judgment and thought content normal.  wax B impaction  Lab Results  Component Value Date   WBC 6.4 04/16/2016   HGB 12.2 04/16/2016  HCT 38.3 04/16/2016   PLT 359.0 04/16/2016   GLUCOSE 108 (H) 06/24/2017   CHOL 192 06/24/2017   TRIG 126.0 06/24/2017   HDL 37.00 (L) 06/24/2017   LDLDIRECT 120.0 05/13/2015   LDLCALC 130 (H) 06/24/2017   ALT 22 06/24/2017   AST 20 06/24/2017   NA 139 06/24/2017   K 3.9 06/24/2017   CL 104 06/24/2017   CREATININE 0.95 06/24/2017   BUN 15 06/24/2017   CO2 28 06/24/2017   TSH 2.06 06/24/2017   INR 1.0 04/19/2007   HGBA1C 6.3  06/24/2017   MICROALBUR 1.5 02/06/2016    Dexascan  Result Date: 10/09/2017 Date of study: 10/07/17 Exam: DUAL X-RAY ABSORPTIOMETRY (DXA) FOR BONE MINERAL DENSITY (BMD) Instrument: [employer] Provider: PCP Indication: screening for osteoporosis Comparison: none (please note that it is not possible to compare data from different instruments) Clinical data: Pt is a 72 y.o. female without previous fractures. Results:  Lumbar spine L1-L4 Femoral neck (FN) 33% distal radius T-score -0.9 RFN: -0.5 LFN: -0.7 n/a Change in BMD from previous DXA test (%) n/a n/a n/a (*) statistically significant Assessment: the BMD is normal according to the [facility] classification for osteoporosis (see below). Fracture risk: low FRAX score: not calculated due to normal BMD Comments: the technical quality of the study is good Recommend optimizing calcium (1200 mg/day) and vitamin D (800 IU/day) intake. No pharmacological treatment is indicated. Followup: Repeat BMD is appropriate after 2 years. WHO criteria for diagnosis of osteoporosis in postmenopausal women and in men 33 y/o or older: - normal: T-score -1.0 to + 1.0 - osteopenia/low bone density: T-score between -2.5 and -1.0 - osteoporosis: T-score below -2.5 - severe osteoporosis: T-score below -2.5 with history of fragility fracture Note: although not part of the WHO classification, the presence of a fragility fracture, regardless of the T-score, should be considered diagnostic of osteoporosis, provided other causes for the fracture have been excluded. Loura Pardon MD    Assessment & Plan:   There are no diagnoses linked to this encounter. I am having Monica Horn maintain her cholecalciferol, cetirizine, aspirin, Multiple Vitamins-Minerals (WOMENS MULTIVITAMIN PLUS PO), triamcinolone cream, amLODipine, pantoprazole, and ibuprofen.  No orders of the defined types were placed in this encounter.    Follow-up: No Follow-up on file.  Walker Kehr,  MD

## 2017-10-14 NOTE — Assessment & Plan Note (Signed)
Norvasc

## 2017-10-14 NOTE — Assessment & Plan Note (Signed)
Irrigate at home or come back here

## 2017-10-28 DIAGNOSIS — H10413 Chronic giant papillary conjunctivitis, bilateral: Secondary | ICD-10-CM | POA: Diagnosis not present

## 2017-10-28 DIAGNOSIS — H40023 Open angle with borderline findings, high risk, bilateral: Secondary | ICD-10-CM | POA: Diagnosis not present

## 2017-10-28 DIAGNOSIS — H04123 Dry eye syndrome of bilateral lacrimal glands: Secondary | ICD-10-CM | POA: Diagnosis not present

## 2017-11-04 DIAGNOSIS — M1612 Unilateral primary osteoarthritis, left hip: Secondary | ICD-10-CM | POA: Diagnosis not present

## 2018-01-04 ENCOUNTER — Encounter: Payer: Self-pay | Admitting: Gastroenterology

## 2018-01-10 ENCOUNTER — Encounter: Payer: Self-pay | Admitting: Gastroenterology

## 2018-02-03 ENCOUNTER — Other Ambulatory Visit: Payer: Self-pay | Admitting: Internal Medicine

## 2018-02-24 ENCOUNTER — Encounter: Payer: Self-pay | Admitting: Internal Medicine

## 2018-02-24 ENCOUNTER — Ambulatory Visit (INDEPENDENT_AMBULATORY_CARE_PROVIDER_SITE_OTHER)
Admission: RE | Admit: 2018-02-24 | Discharge: 2018-02-24 | Disposition: A | Discharge status: Home / Self Care | Payer: Medicare PPO | Source: Ambulatory Visit | Attending: Internal Medicine | Admitting: Internal Medicine

## 2018-02-24 ENCOUNTER — Ambulatory Visit: Payer: Medicare PPO | Admitting: Internal Medicine

## 2018-02-24 ENCOUNTER — Other Ambulatory Visit (INDEPENDENT_AMBULATORY_CARE_PROVIDER_SITE_OTHER): Payer: Medicare PPO

## 2018-02-24 VITALS — BP 124/72 | HR 62 | Temp 98.2°F | Ht 61.0 in | Wt 170.0 lb

## 2018-02-24 DIAGNOSIS — D509 Iron deficiency anemia, unspecified: Secondary | ICD-10-CM

## 2018-02-24 DIAGNOSIS — M79641 Pain in right hand: Secondary | ICD-10-CM

## 2018-02-24 DIAGNOSIS — H6123 Impacted cerumen, bilateral: Secondary | ICD-10-CM

## 2018-02-24 DIAGNOSIS — R600 Localized edema: Secondary | ICD-10-CM

## 2018-02-24 DIAGNOSIS — E119 Type 2 diabetes mellitus without complications: Secondary | ICD-10-CM | POA: Diagnosis not present

## 2018-02-24 DIAGNOSIS — D500 Iron deficiency anemia secondary to blood loss (chronic): Secondary | ICD-10-CM

## 2018-02-24 DIAGNOSIS — N390 Urinary tract infection, site not specified: Secondary | ICD-10-CM

## 2018-02-24 DIAGNOSIS — E785 Hyperlipidemia, unspecified: Secondary | ICD-10-CM

## 2018-02-24 DIAGNOSIS — I10 Essential (primary) hypertension: Secondary | ICD-10-CM | POA: Diagnosis not present

## 2018-02-24 DIAGNOSIS — K921 Melena: Secondary | ICD-10-CM | POA: Diagnosis not present

## 2018-02-24 DIAGNOSIS — M159 Polyosteoarthritis, unspecified: Secondary | ICD-10-CM

## 2018-02-24 DIAGNOSIS — M150 Primary generalized (osteo)arthritis: Secondary | ICD-10-CM

## 2018-02-24 DIAGNOSIS — K219 Gastro-esophageal reflux disease without esophagitis: Secondary | ICD-10-CM

## 2018-02-24 LAB — CBC WITH DIFFERENTIAL/PLATELET
BASOS PCT: 1 % (ref 0.0–3.0)
Basophils Absolute: 0.1 10*3/uL (ref 0.0–0.1)
EOS PCT: 2.3 % (ref 0.0–5.0)
Eosinophils Absolute: 0.2 10*3/uL (ref 0.0–0.7)
HEMATOCRIT: 33.9 % — AB (ref 36.0–46.0)
HEMOGLOBIN: 10.4 g/dL — AB (ref 12.0–15.0)
LYMPHS PCT: 31.9 % (ref 12.0–46.0)
Lymphs Abs: 2.1 10*3/uL (ref 0.7–4.0)
MCHC: 30.6 g/dL (ref 30.0–36.0)
MCV: 68.4 fl — ABNORMAL LOW (ref 78.0–100.0)
Monocytes Absolute: 0.7 10*3/uL (ref 0.1–1.0)
Monocytes Relative: 10.2 % (ref 3.0–12.0)
NEUTROS ABS: 3.7 10*3/uL (ref 1.4–7.7)
Neutrophils Relative %: 54.6 % (ref 43.0–77.0)
RBC: 4.96 Mil/uL (ref 3.87–5.11)
RDW: 17.4 % — AB (ref 11.5–15.5)
WBC: 6.7 10*3/uL (ref 4.0–10.5)

## 2018-02-24 LAB — URINALYSIS, ROUTINE W REFLEX MICROSCOPIC
Bilirubin Urine: NEGATIVE
HGB URINE DIPSTICK: NEGATIVE
Ketones, ur: NEGATIVE
NITRITE: NEGATIVE
RBC / HPF: NONE SEEN (ref 0–?)
SPECIFIC GRAVITY, URINE: 1.02 (ref 1.000–1.030)
Total Protein, Urine: NEGATIVE
URINE GLUCOSE: NEGATIVE
UROBILINOGEN UA: 0.2 (ref 0.0–1.0)
pH: 6 (ref 5.0–8.0)

## 2018-02-24 LAB — IRON,TIBC AND FERRITIN PANEL
%SAT: 8 % (calc) — ABNORMAL LOW (ref 16–45)
Ferritin: 23 ng/mL (ref 16–288)
Iron: 39 ug/dL — ABNORMAL LOW (ref 45–160)
TIBC: 484 mcg/dL (calc) — ABNORMAL HIGH (ref 250–450)

## 2018-02-24 NOTE — Progress Notes (Signed)
Subjective:  Patient ID: Monica Horn, female    DOB: 1946/06/29  Age: 72 y.o. MRN: [id number]  CC: No chief complaint on file.   HPI Monica Horn presents for HTN, DM, wax f/u  Outpatient Medications Prior to Visit  Medication Sig Dispense Refill  . amLODipine (NORVASC) 5 MG tablet Take 1 tablet (5 mg total) by mouth daily. 90 tablet 1  . aspirin 81 MG tablet     . cetirizine (ZYRTEC) 10 MG tablet Take 1 tablet (10 mg total) by mouth daily as needed. 90 tablet 3  . Cholecalciferol (VITAMIN D3) 1000 UNITS tablet Take 1,000 Units by mouth daily.      Marland Kitchen ibuprofen (ADVIL,MOTRIN) 400 MG tablet TAKE 1 TABLET EVERY 8 HOURS AS NEEDED FOR PAIN 90 tablet 1  . Multiple Vitamins-Minerals (WOMENS MULTIVITAMIN PLUS PO) daily.    . pantoprazole (PROTONIX) 40 MG tablet TAKE 1 TABLET EVERY DAY; NEED ANNUAL PHYSICAL 90 tablet 1  . triamcinolone cream (KENALOG) 0.5 % Apply 1 application topically 3 (three) times daily. 60 g 1   No facility-administered medications prior to visit.     ROS: Review of Systems  Constitutional: Negative for activity change, appetite change, chills, fatigue and unexpected weight change.  HENT: Negative for congestion, mouth sores and sinus pressure.   Eyes: Negative for visual disturbance.  Respiratory: Negative for cough and chest tightness.   Gastrointestinal: Negative for abdominal pain and nausea.  Genitourinary: Negative for difficulty urinating, frequency and vaginal pain.  Musculoskeletal: Negative for back pain and gait problem.  Skin: Negative for pallor and rash.  Neurological: Negative for dizziness, tremors, weakness, numbness and headaches.  Psychiatric/Behavioral: Negative for confusion and sleep disturbance.    Objective:  BP 124/72 (BP Location: Left Arm, Patient Position: Sitting, Cuff Size: Large)   Pulse 62   Temp 98.2 F (36.8 C) (Oral)   Ht 5\' 1"  (1.549 m)   Wt 170 lb (77.1 kg)   SpO2 98%   BMI 32.12 kg/m   BP Readings from Last 3  Encounters:  02/24/18 124/72  10/14/17 122/74  09/09/17 124/72    Wt Readings from Last 3 Encounters:  02/24/18 170 lb (77.1 kg)  10/14/17 168 lb (76.2 kg)  09/09/17 170 lb (77.1 kg)    Physical Exam  Constitutional: She appears well-developed. No distress.  HENT:  Head: Normocephalic.  Right Ear: External ear normal.  Left Ear: External ear normal.  Nose: Nose normal.  Mouth/Throat: Oropharynx is clear and moist.  Eyes: Pupils are equal, round, and reactive to light. Conjunctivae are normal. Right eye exhibits no discharge. Left eye exhibits no discharge.  Neck: Normal range of motion. Neck supple. No JVD present. No tracheal deviation present. No thyromegaly present.  Cardiovascular: Normal rate, regular rhythm and normal heart sounds.  Pulmonary/Chest: No stridor. No respiratory distress. She has no wheezes.  Abdominal: Soft. Bowel sounds are normal. She exhibits no distension and no mass. There is no tenderness. There is no rebound and no guarding.  Musculoskeletal: She exhibits no edema or tenderness.  Lymphadenopathy:    She has no cervical adenopathy.  Neurological: She displays normal reflexes. No cranial nerve deficit. She exhibits normal muscle tone. Coordination normal.  Skin: No rash noted. No erythema.  Psychiatric: She has a normal mood and affect. Her behavior is normal. Judgment and thought content normal.  wax B Edema 1+ B MCP #1-2 swollen, tender   Procedure Note :     Procedure :  Ear irrigation  Indication:  Cerumen impaction   Risks, including pain, dizziness, eardrum perforation, bleeding, infection and others as well as benefits were explained to the patient in detail. Verbal consent was obtained and the patient agreed to proceed.    We used "The Elephant Ear Irrigation Device" filled with lukewarm water for irrigation. A large amount wax was recovered. Procedure has also required manual wax removal with an ear wax curette and ear forceps.    Tolerated well. Complications: None.   Postprocedure instructions :  Call if problems.    Lab Results  Component Value Date   WBC 6.4 04/16/2016   HGB 12.2 04/16/2016   HCT 38.3 04/16/2016   PLT 359.0 04/16/2016   GLUCOSE 108 (H) 06/24/2017   CHOL 192 06/24/2017   TRIG 126.0 06/24/2017   HDL 37.00 (L) 06/24/2017   LDLDIRECT 120.0 05/13/2015   LDLCALC 130 (H) 06/24/2017   ALT 22 06/24/2017   AST 20 06/24/2017   NA 139 06/24/2017   K 3.9 06/24/2017   CL 104 06/24/2017   CREATININE 0.95 06/24/2017   BUN 15 06/24/2017   CO2 28 06/24/2017   TSH 2.06 06/24/2017   INR 1.0 04/19/2007   HGBA1C 6.3 06/24/2017   MICROALBUR 1.5 02/06/2016    Dexascan  Result Date: 10/09/2017 Date of study: 10/07/17 Exam: DUAL X-RAY ABSORPTIOMETRY (DXA) FOR BONE MINERAL DENSITY (BMD) Instrument: [employer] Provider: PCP Indication: screening for osteoporosis Comparison: none (please note that it is not possible to compare data from different instruments) Clinical data: Pt is a 72 y.o. female without previous fractures. Results:  Lumbar spine L1-L4 Femoral neck (FN) 33% distal radius T-score -0.9 RFN: -0.5 LFN: -0.7 n/a Change in BMD from previous DXA test (%) n/a n/a n/a (*) statistically significant Assessment: the BMD is normal according to the [facility] classification for osteoporosis (see below). Fracture risk: low FRAX score: not calculated due to normal BMD Comments: the technical quality of the study is good Recommend optimizing calcium (1200 mg/day) and vitamin D (800 IU/day) intake. No pharmacological treatment is indicated. Followup: Repeat BMD is appropriate after 2 years. WHO criteria for diagnosis of osteoporosis in postmenopausal women and in men 86 y/o or older: - normal: T-score -1.0 to + 1.0 - osteopenia/low bone density: T-score between -2.5 and -1.0 - osteoporosis: T-score below -2.5 - severe osteoporosis: T-score below -2.5 with history of fragility fracture Note: although not  part of the WHO classification, the presence of a fragility fracture, regardless of the T-score, should be considered diagnostic of osteoporosis, provided other causes for the fracture have been excluded. Loura Pardon MD    Assessment & Plan:   There are no diagnoses linked to this encounter.   No orders of the defined types were placed in this encounter.    Follow-up: No follow-ups on file.  Walker Kehr, MD

## 2018-02-24 NOTE — Assessment & Plan Note (Signed)
  On diet  

## 2018-02-24 NOTE — Assessment & Plan Note (Signed)
Chronic  Dr Fuller Plan Colon is due this fall 2019

## 2018-02-24 NOTE — Assessment & Plan Note (Signed)
Hand X ray R

## 2018-02-24 NOTE — Assessment & Plan Note (Signed)
UA

## 2018-02-24 NOTE — Assessment & Plan Note (Signed)
Chronic  Dr Fuller Plan Colon is due this fall 2019 Ice craving

## 2018-02-24 NOTE — Assessment & Plan Note (Addendum)
CBC Colon is due this fall

## 2018-02-24 NOTE — Assessment & Plan Note (Signed)
Will irrigate 

## 2018-02-24 NOTE — Assessment & Plan Note (Signed)
Protonix.  ?

## 2018-02-24 NOTE — Assessment & Plan Note (Signed)
- 

## 2018-02-24 NOTE — Patient Instructions (Addendum)
Reduce amlodipine to 2.5 mg/day due to leg swelling

## 2018-02-24 NOTE — Assessment & Plan Note (Signed)
Reduce amlodipine to 2.5 mg/day due to leg swelling

## 2018-02-26 ENCOUNTER — Other Ambulatory Visit: Payer: Self-pay | Admitting: Internal Medicine

## 2018-03-03 ENCOUNTER — Other Ambulatory Visit: Payer: Self-pay | Admitting: Internal Medicine

## 2018-03-03 MED ORDER — PANTOPRAZOLE SODIUM 40 MG PO TBEC: 40.0000 mg | DELAYED_RELEASE_TABLET | Freq: Every day | ORAL | 3 refills | Status: DC

## 2018-03-03 MED ORDER — MAGIC MOUTHWASH: 5.0000 mL | Freq: Four times a day (QID) | ORAL | 1 refills | Status: DC | PRN

## 2018-03-03 NOTE — Progress Notes (Signed)
Subjective:  Patient ID: Monica Horn, female    DOB: 07/25/1946  Age: 72 y.o. MRN: [id number]  CC: No chief complaint on file.   HPI Monica Horn   Outpatient Medications Prior to Visit  Medication Sig Dispense Refill  . amLODipine (NORVASC) 5 MG tablet Take 1 tablet (5 mg total) by mouth daily. 90 tablet 1  . aspirin 81 MG tablet     . cetirizine (ZYRTEC) 10 MG tablet Take 1 tablet (10 mg total) by mouth daily as needed. 90 tablet 3  . Cholecalciferol (VITAMIN D3) 1000 UNITS tablet Take 1,000 Units by mouth daily.      . Multiple Vitamins-Minerals (WOMENS MULTIVITAMIN PLUS PO) daily.    . pantoprazole (PROTONIX) 40 MG tablet TAKE 1 TABLET EVERY DAY; NEED ANNUAL PHYSICAL 90 tablet 1   No facility-administered medications prior to visit.     ROS: Review of Systems  Constitutional: Positive for fever.  HENT: Positive for mouth sores. Negative for facial swelling, nosebleeds, postnasal drip, rhinorrhea, trouble swallowing and voice change.     Objective:  There were no vitals taken for this visit.  BP Readings from Last 3 Encounters:  02/24/18 124/72  10/14/17 122/74  09/09/17 124/72    Wt Readings from Last 3 Encounters:  02/24/18 170 lb (77.1 kg)  10/14/17 168 lb (76.2 kg)  09/09/17 170 lb (77.1 kg)    Physical Exam  Constitutional: She appears well-developed. No distress.  HENT:  Head: Normocephalic.  Right Ear: External ear normal.  Left Ear: External ear normal.  Nose: Nose normal.  Mouth/Throat: Oropharynx is clear and moist.  Eyes: Pupils are equal, round, and reactive to light. Conjunctivae are normal. Right eye exhibits no discharge. Left eye exhibits no discharge.  Neck: Normal range of motion. Neck supple. No JVD present. No tracheal deviation present. No thyromegaly present.  Pulmonary/Chest: No respiratory distress.  Abdominal: Soft. Bowel sounds are normal.  Lymphadenopathy:    She has no cervical adenopathy.  Neurological: No cranial nerve  deficit.  Skin: No rash noted.  Psychiatric: She has a normal mood and affect. Her behavior is normal. Judgment and thought content normal.    Lab Results  Component Value Date   WBC 6.7 02/24/2018   HGB 10.4 (L) 02/24/2018   HCT 33.9 (L) 02/24/2018   PLT 439.0 Repeated and verified X2. (H) 02/24/2018   GLUCOSE 108 (H) 06/24/2017   CHOL 192 06/24/2017   TRIG 126.0 06/24/2017   HDL 37.00 (L) 06/24/2017   LDLDIRECT 120.0 05/13/2015   LDLCALC 130 (H) 06/24/2017   ALT 22 06/24/2017   AST 20 06/24/2017   NA 139 06/24/2017   K 3.9 06/24/2017   CL 104 06/24/2017   CREATININE 0.95 06/24/2017   BUN 15 06/24/2017   CO2 28 06/24/2017   TSH 2.06 06/24/2017   INR 1.0 04/19/2007   HGBA1C 6.3 06/24/2017   MICROALBUR 1.5 02/06/2016    Dg Hand Complete Right  Result Date: 02/24/2018 CLINICAL DATA:  Pain at the base of the second and third digits for 6 months with no trauma. EXAM: RIGHT HAND - COMPLETE 3+ VIEW COMPARISON:  None. FINDINGS: Mild osteoarthritic degenerative changes most prominent in the first interphalangeal joint in the third MCP joint. IMPRESSION: Mild degenerative changes as above.  No other acute abnormalities. Electronically Signed   By: Dorise Bullion III M.D   On: 02/24/2018 15:07    Assessment & Plan:   There are no diagnoses linked to this encounter.  Meds ordered this encounter  Medications  . pantoprazole (PROTONIX) 40 MG tablet    Sig: Take 1 tablet (40 mg total) by mouth daily.    Dispense:  90 tablet    Refill:  3     Follow-up: No follow-ups on file.  Walker Kehr, MD

## 2018-03-15 ENCOUNTER — Other Ambulatory Visit: Payer: Self-pay | Admitting: Internal Medicine

## 2018-03-22 ENCOUNTER — Ambulatory Visit (AMBULATORY_SURGERY_CENTER): Payer: Self-pay

## 2018-03-22 VITALS — Ht 61.0 in | Wt 171.4 lb

## 2018-03-22 DIAGNOSIS — Z86010 Personal history of colonic polyps: Secondary | ICD-10-CM

## 2018-03-22 NOTE — Progress Notes (Signed)
Per pt, no allergies to soy or egg products.Pt not taking any weight loss meds or using  O2 at home.   Pt refused emmi video.   Pt came into the office today for her PV prior to her colon on 04/05/18. While asking questions, the pt states that Dr Alain Marion had dicussed with her having an endoscopy for anemia. Informed pt we could do an endo/colon at the same time, but we would need an order to schedule and she would need to be evaluated by our doctor. Scheduled an OV with Jessica-PA on 04/06/18 at 2:30pm to discuss an endo/ colon. Colon on 04/05/18 was cx'd. Pt understood. She will call back if she has further questions. Gwyndolyn Saxon

## 2018-03-23 ENCOUNTER — Telehealth: Payer: Self-pay | Admitting: Internal Medicine

## 2018-03-23 NOTE — Telephone Encounter (Signed)
Copied from [place] [id number]. Topic: General - Other >> Mar 23, 2018  8:47 AM Cecelia Byars, NT wrote:  Reason for CRM: The patient called and said she was supposed to have had  an endoscopy and a colonoscopy done at the  same time . It could be be done due to the order  not being sent over from the practice for the endo ,and she was told during her visit  on 03/03/18 , she could have them done together due to her being anemic,She also would like to know why she was prescribed  magic mouthwash SOLN this has never been discussed with her  , please advise [phone] or  [phone] please all as soon as possible

## 2018-03-23 NOTE — Telephone Encounter (Signed)
Please advise about endoscopy.

## 2018-03-24 NOTE — Telephone Encounter (Signed)
Sorry for the misunderstanding. She needs to see Dr Fuller Plan first to have him decide if she can have both procedures done. Pls contact his office to make an appt. Thx

## 2018-03-29 NOTE — Telephone Encounter (Signed)
LMTCB

## 2018-04-05 ENCOUNTER — Encounter: Payer: Medicare HMO | Admitting: Gastroenterology

## 2018-04-06 ENCOUNTER — Encounter: Payer: Self-pay | Admitting: Gastroenterology

## 2018-04-06 ENCOUNTER — Ambulatory Visit: Payer: Medicare PPO | Admitting: Gastroenterology

## 2018-04-06 VITALS — BP 108/66 | HR 96 | Ht 61.0 in | Wt 170.0 lb

## 2018-04-06 DIAGNOSIS — D509 Iron deficiency anemia, unspecified: Secondary | ICD-10-CM | POA: Diagnosis not present

## 2018-04-06 DIAGNOSIS — Z86010 Personal history of colonic polyps: Secondary | ICD-10-CM | POA: Diagnosis not present

## 2018-04-06 MED ORDER — NA SULFATE-K SULFATE-MG SULF 17.5-3.13-1.6 GM/177ML PO SOLN: 1.0000 | ORAL | 0 refills | Status: DC

## 2018-04-06 NOTE — Patient Instructions (Signed)

## 2018-04-06 NOTE — Progress Notes (Signed)
04/06/2018 Monica Horn [id number] 25-Apr-1946   HISTORY OF PRESENT ILLNESS:  This is a 72 year old female who is a patient of Dr. Lynne Leader.  She had a colonoscopy in 01/2015 at which time she was found to have 5 polyps that were removed and were a couple were adenomatous polyps.  Repeat was recommended in 3 years from that time.  She is here today to schedule that colonoscopy, but also her PCP is suggesting an endoscopy as well.  She was recently found to be anemic.  This is a new issue as she has never been anemic in the past.  Hemoglobin was 10.4 g.  MCV is low.  Iron studies are low.  She started on an over-the-counter oral iron supplement on her own, but is unsure of the dose.  She denies seeing any blood in her stools or any black stools.  No hemoccults performed.   Past Medical History:  Diagnosis Date  . Allergy   . Arthritis   . Blood transfusion   . Blood transfusion without reported diagnosis   . Diabetes mellitus    PT. DENIES- no medicines for this   . Fatty infiltration of liver   . GERD (gastroesophageal reflux disease)   . Hydradenitis 01/2009   Right S aureus  . Hyperlipidemia   . Hypertension   . Internal hemorrhoids   . Tubulovillous adenoma polyp of colon 03/2007   w/ focal high grade dysplasia, no carcinoma   Past Surgical History:  Procedure Laterality Date  . CARPAL TUNNEL RELEASE     Lt  wrist, rt wrist 2014  . COLONOSCOPY    . POLYPECTOMY    . TUBAL LIGATION      reports that she quit smoking about 40 years ago. She has never used smokeless tobacco. She reports that she drinks alcohol. She reports that she does not use drugs. family history includes Alcohol abuse in her father; Cancer in her father; Hypertension in her mother and other; Lung cancer in her mother; Stroke in her mother and other. Allergies  Allergen Reactions  . Benazepril     cough  . Losartan Other (See Comments)    Headache  . Lovastatin     achy      Outpatient Encounter  Medications as of 04/06/2018  Medication Sig  . AMBULATORY NON FORMULARY MEDICATION daily. Medication Name: iron supplement (1800) OTC  . amLODipine (NORVASC) 5 MG tablet TAKE 1 TABLET EVERY DAY (Patient taking differently: TAKE 1/2 tablet daily)  . aspirin 81 MG tablet Take by mouth daily.   . cetirizine (ZYRTEC) 10 MG tablet Take 1 tablet (10 mg total) by mouth daily as needed.  . Cholecalciferol (VITAMIN D3) 1000 UNITS tablet Take 1,000 Units by mouth daily.    . magic mouthwash SOLN Take 5 mLs by mouth 4 (four) times daily as needed (swish, hold and spit prn). (Patient not taking: Reported on 03/22/2018)  . Multiple Vitamins-Minerals (WOMENS MULTIVITAMIN PLUS PO) daily.  . pantoprazole (PROTONIX) 40 MG tablet TAKE 1 TABLET EVERY DAY; NEED ANNUAL PHYSICAL   No facility-administered encounter medications on file as of 04/06/2018.      REVIEW OF SYSTEMS  : All other systems reviewed and negative except where noted in the History of Present Illness.   PHYSICAL EXAM: BP 108/66   Pulse 96   Ht 5\' 1"  (1.549 m)   Wt 170 lb (77.1 kg)   BMI 32.12 kg/m  General: Well developed black female in  no acute distress Head: Normocephalic and atraumatic Eyes:  Sclerae anicteric, conjunctiva pink. Ears: Normal auditory acuity Lungs: Clear throughout to auscultation; no increased WOB. Heart: Regular rate and rhythm; no M/R/G. Abdomen: Soft, non-distended.  BS present.  Non-tender. Rectal:  Will be done at the time of colonoscopy. Musculoskeletal: Symmetrical with no gross deformities  Skin: No lesions on visible extremities Extremities: No edema  Neurological: Alert oriented x 4, grossly non-focal Psychological:  Alert and cooperative. Normal mood and affect  ASSESSMENT AND PLAN: *Personal history of colon polyps:  Several adenomatous polyps in 01/2015.  Due for colonoscopy with Dr. Fuller Plan.  Will schedule. *IDA:  Hgb 10.4 grams.  This is new as it does not appear she has ever been anemic in the  past.  MCV is low and iron studies low.  No hemoccults performed.  No overt sign of bleeding.  Will schedule for EGD with colonoscopy to rule out GI source of blood loss.  Continue OTC iron supplement for now.  **The risks, benefits, and alternatives to EGD and colonoscpoy were discussed with the patient and she consents to proceed.    CC:  Plotnikov, Evie Lacks, MD

## 2018-04-07 MED ORDER — NA SULFATE-K SULFATE-MG SULF 17.5-3.13-1.6 GM/177ML PO SOLN: 1.0000 | ORAL | 0 refills | Status: AC

## 2018-04-07 NOTE — Addendum Note (Signed)
Addended by: Wyline Beady on: 04/07/2018 10:53 AM   Modules accepted: Orders

## 2018-04-10 ENCOUNTER — Telehealth: Payer: Self-pay | Admitting: Gastroenterology

## 2018-04-10 ENCOUNTER — Telehealth: Payer: Self-pay | Admitting: Internal Medicine

## 2018-04-10 NOTE — Telephone Encounter (Signed)
Copied from [place] [id number]. Topic: Quick Communication - See Telephone Encounter >> Apr 10, 2018  3:23 PM Synthia Innocent wrote: CRM for notification. See Telephone encounter for: 04/10/18. Patient [facility], Dr Lynnette Caffey, on 04/05/18 for vaginal bleeding, had US done, was told she has blood in uterus. They were unable to get a bx of uterus, patient will have to be put to sleep for procedure. Also Would like to know which Iron med that Dr Alain Marion recommends she take.

## 2018-04-10 NOTE — Telephone Encounter (Signed)
Please advise 

## 2018-04-10 NOTE — Telephone Encounter (Signed)
Informed patient we have a free sample of a Suprep if she can come by the office to pick it up. Patient states she will come by the office today.

## 2018-04-10 NOTE — Progress Notes (Signed)
Reviewed and agree with management plan.  Kianni Lheureux T. Matti Killingsworth, MD FACG 

## 2018-04-11 NOTE — Telephone Encounter (Signed)
OTC Ferrous sulfate 325 mg/d Good luck w/procedure! Thx

## 2018-04-11 NOTE — Telephone Encounter (Signed)
LM notifying pt

## 2018-04-20 ENCOUNTER — Telehealth: Payer: Self-pay | Admitting: Gastroenterology

## 2018-04-21 NOTE — Telephone Encounter (Signed)
Spoke to patient she was confused that we did not give her the prep instructions for her endoscopy. I explained to her that the prep for the colonoscopy includes the endoscopy prep, just clear liquids and fasting 3 hours before. She verbalized understanding.

## 2018-04-25 ENCOUNTER — Other Ambulatory Visit: Payer: Self-pay | Admitting: Internal Medicine

## 2018-04-25 DIAGNOSIS — Z1231 Encounter for screening mammogram for malignant neoplasm of breast: Secondary | ICD-10-CM

## 2018-05-26 ENCOUNTER — Ambulatory Visit: Payer: Medicare PPO

## 2018-06-01 ENCOUNTER — Ambulatory Visit: Payer: Medicare PPO | Admitting: Internal Medicine

## 2018-06-05 ENCOUNTER — Ambulatory Visit: Payer: Medicare PPO | Admitting: Internal Medicine

## 2018-06-05 DIAGNOSIS — Z0289 Encounter for other administrative examinations: Secondary | ICD-10-CM

## 2018-06-06 ENCOUNTER — Encounter: Payer: Self-pay | Admitting: Gastroenterology

## 2018-06-06 ENCOUNTER — Ambulatory Visit (AMBULATORY_SURGERY_CENTER): Payer: Medicare PPO | Admitting: Gastroenterology

## 2018-06-06 VITALS — BP 124/73 | HR 90 | Temp 98.4°F | Resp 16 | Ht 61.0 in | Wt 170.0 lb

## 2018-06-06 DIAGNOSIS — D120 Benign neoplasm of cecum: Secondary | ICD-10-CM | POA: Diagnosis not present

## 2018-06-06 DIAGNOSIS — K317 Polyp of stomach and duodenum: Secondary | ICD-10-CM | POA: Diagnosis not present

## 2018-06-06 DIAGNOSIS — D509 Iron deficiency anemia, unspecified: Secondary | ICD-10-CM

## 2018-06-06 DIAGNOSIS — Z86010 Personal history of colonic polyps: Secondary | ICD-10-CM

## 2018-06-06 DIAGNOSIS — D123 Benign neoplasm of transverse colon: Secondary | ICD-10-CM

## 2018-06-06 DIAGNOSIS — Z860101 Personal history of adenomatous and serrated colon polyps: Secondary | ICD-10-CM

## 2018-06-06 DIAGNOSIS — K2970 Gastritis, unspecified, without bleeding: Secondary | ICD-10-CM | POA: Diagnosis not present

## 2018-06-06 MED ORDER — SODIUM CHLORIDE 0.9 % IV SOLN: 500.0000 mL | Freq: Once | INTRAVENOUS | Status: DC

## 2018-06-06 NOTE — Patient Instructions (Signed)
HANDOUTS GIVEN FOR POLYPS, HEMORRHOIDS AND GASTRITIS  DO NOT USE (NSAIDS): ASPIRIN, ALEVE, IBUPROFEN, NAPROXEN FOR 2 WEEKS.  YOU MAY USE TYLENOL IF NEEDED   YOU HAD AN ENDOSCOPIC PROCEDURE TODAY AT THE [facility]:   Refer to the procedure report that was given to you for any specific questions about what was found during the examination.  If the procedure report does not answer your questions, please call your gastroenterologist to clarify.  If you requested that your care partner not be given the details of your procedure findings, then the procedure report has been included in a sealed envelope for you to review at your convenience later.  YOU SHOULD EXPECT: Some feelings of bloating in the abdomen. Passage of more gas than usual.  Walking can help get rid of the air that was put into your GI tract during the procedure and reduce the bloating. If you had a lower endoscopy (such as a colonoscopy or flexible sigmoidoscopy) you may notice spotting of blood in your stool or on the toilet paper. If you underwent a bowel prep for your procedure, you may not have a normal bowel movement for a few days.  Please Note:  You might notice some irritation and congestion in your nose or some drainage.  This is from the oxygen used during your procedure.  There is no need for concern and it should clear up in a day or so.  SYMPTOMS TO REPORT IMMEDIATELY:   Following lower endoscopy (colonoscopy or flexible sigmoidoscopy):  Excessive amounts of blood in the stool  Significant tenderness or worsening of abdominal pains  Swelling of the abdomen that is new, acute  Fever of 100F or higher   Following upper endoscopy (EGD)  Vomiting of blood or coffee ground material  New chest pain or pain under the shoulder blades  Painful or persistently difficult swallowing  New shortness of breath  Fever of 100F or higher  Black, tarry-looking stools  For urgent or emergent issues, a  gastroenterologist can be reached at any hour by calling [phone].   DIET:  We do recommend a small meal at first, but then you may proceed to your regular diet.  Drink plenty of fluids but you should avoid alcoholic beverages for 24 hours.  ACTIVITY:  You should plan to take it easy for the rest of today and you should NOT DRIVE or use heavy machinery until tomorrow (because of the sedation medicines used during the test).    FOLLOW UP: Our staff will call the number listed on your records the next business day following your procedure to check on you and address any questions or concerns that you may have regarding the information given to you following your procedure. If we do not reach you, we will leave a message.  However, if you are feeling well and you are not experiencing any problems, there is no need to return our call.  We will assume that you have returned to your regular daily activities without incident.  If any biopsies were taken you will be contacted by phone or by letter within the next 1-3 weeks.  Please call us at [phone] if you have not heard about the biopsies in 3 weeks.    SIGNATURES/CONFIDENTIALITY: You and/or your care partner have signed paperwork which will be entered into your electronic medical record.  These signatures attest to the fact that that the information above on your After Visit Summary has been reviewed and is understood.  Full responsibility of the confidentiality of this discharge information lies with you and/or your care-partner.

## 2018-06-06 NOTE — Op Note (Signed)
[facility] Patient Name: Monica Horn Procedure Date: 06/06/2018 9:32 AM MRN: [id number] Endoscopist: Ladene Artist , MD Age: 72 Referring MD:  Date of Birth: Sep 14, 1945 Gender: Female Account #: [id number] Procedure:                Upper GI endoscopy Indications:              Unexplained iron deficiency anemia Medicines:                Monitored Anesthesia Care Procedure:                Pre-Anesthesia Assessment:                           - Prior to the procedure, a History and Physical                            was performed, and patient medications and                            allergies were reviewed. The patient's tolerance of                            previous anesthesia was also reviewed. The risks                            and benefits of the procedure and the sedation                            options and risks were discussed with the patient.                            All questions were answered, and informed consent                            was obtained. Prior Anticoagulants: The patient has                            taken no previous anticoagulant or antiplatelet                            agents. ASA Grade Assessment: II - A patient with                            mild systemic disease. After reviewing the risks                            and benefits, the patient was deemed in                            satisfactory condition to undergo the procedure.                           After obtaining informed consent, the endoscope was  passed under direct vision. Throughout the                            procedure, the patient's blood pressure, pulse, and                            oxygen saturations were monitored continuously. The                            Model [id number]-[id number]) scope was introduced                            through the mouth, and advanced to the second part                            of duodenum. The  upper GI endoscopy was                            accomplished without difficulty. The upper GI                            endoscopy was somewhat difficult due to abnormal                            anatomy. [Solution]. Scope In: Scope Out: Findings:                 The examined esophagus was normal.                           A single 35 mm pedunculated polyp with no bleeding                            and no stigmata of recent bleeding was found at the                            pylorus. The polyp was prolpased into the duodenal                            bulb. The polyp was removed with a hot snare.                            Resection and retrieval were complete. To prevent                            bleeding after the polypectomy, three hemostatic                            clips were successfully placed (MR conditional).                            The post polypectomy site was initially not easily  visualized. After the 1st clip was placed the                            polypectomy site prolapsed into the preplyoric area                            in the gastric lumen and visualization improved                            substantially. There was no bleeding at the end of                            the procedure.                           Diffuse mild inflammation characterized by                            erythema, friability and granularity was found in                            the gastric fundus and in the gastric body.                            Biopsies were taken with a cold forceps for                            histology.                           The exam of the stomach was otherwise normal.                           The duodenal bulb and second portion of the                            duodenum were normal. Complications:            No immediate complications. Estimated Blood Loss:     Estimated blood loss was minimal. Impression:                - Normal esophagus.                           - A single gastric polyp. Resected and retrieved.                            Clips (MR conditional) were placed.                           - Gastritis. Biopsied.                           - Normal duodenal bulb and second portion of the  duodenum. Recommendation:           - Patient has a contact number available for                            emergencies. The signs and symptoms of potential                            delayed complications were discussed with the                            patient. Return to normal activities tomorrow.                            Written discharge instructions were provided to the                            patient.                           - Resume previous diet.                           - Continue present medications.                           - No aspirin, ibuprofen, naproxen, or other                            non-steroidal anti-inflammatory drugs for 2 weeks                            after polyp removal.                           - Await pathology results. Ladene Artist, MD 06/06/2018 10:50:25 AM This report has been signed electronically.

## 2018-06-06 NOTE — Progress Notes (Signed)
To PACU, VSS. Report to [email or web address].tb 

## 2018-06-06 NOTE — Op Note (Addendum)
[facility] Patient Name: Monica Horn Procedure Date: 06/06/2018 9:32 AM MRN: [id number] Endoscopist: Ladene Artist , MD Age: 72 Referring MD:  Date of Birth: 05/08/1946 Gender: Female Account #: [id number] Procedure:                Colonoscopy Indications:              Surveillance: Personal history of adenomatous                            polyps on last colonoscopy 3 years ago. Iron                            deficiency anemia. Medicines:                Monitored Anesthesia Care Procedure:                Pre-Anesthesia Assessment:                           - Prior to the procedure, a History and Physical                            was performed, and patient medications and                            allergies were reviewed. The patient's tolerance of                            previous anesthesia was also reviewed. The risks                            and benefits of the procedure and the sedation                            options and risks were discussed with the patient.                            All questions were answered, and informed consent                            was obtained. Prior Anticoagulants: The patient has                            taken no previous anticoagulant or antiplatelet                            agents. ASA Grade Assessment: II - A patient with                            mild systemic disease. After reviewing the risks                            and benefits, the patient was deemed in  satisfactory condition to undergo the procedure.                           After obtaining informed consent, the colonoscope                            was passed under direct vision. Throughout the                            procedure, the patient's blood pressure, pulse, and                            oxygen saturations were monitored continuously. The                            Colonoscope was introduced through the anus and                             advanced to the the cecum, identified by                            appendiceal orifice and ileocecal valve. The                            ileocecal valve, appendiceal orifice, and rectum                            were photographed. The quality of the bowel                            preparation was good. The patient tolerated the                            procedure well. The colonoscopy was somewhat                            difficult due to a tortuous colon. Successful                            completion of the procedure was aided by using                            manual pressure, withdrawing and reinserting the                            scope, straightening and shortening the scope to                            obtain bowel loop reduction and using scope torsion. Scope In: 10:00:33 AM Scope Out: 10:20:32 AM Scope Withdrawal Time: 0 hours 12 minutes 1 second  Total Procedure Duration: 0 hours 19 minutes 59 seconds  Findings:                 The perianal and digital rectal examinations were  normal.                           A 8 mm polyp was found in the cecum. The polyp was                            sessile. The polyp was removed with a cold snare.                            Resection and retrieval were complete.                           A 5 mm polyp was found in the transverse colon. The                            polyp was sessile. The polyp was removed with a                            cold biopsy forceps. Resection and retrieval were                            complete.                           A tattoo was seen at the hepatic flexure. A                            post-polypectomy scar was found at the tattoo site.                           Internal hemorrhoids were found during                            retroflexion. The hemorrhoids were small and Grade                            I (internal hemorrhoids that do not  prolapse).                           The exam was otherwise without abnormality on                            direct and retroflexion views. Complications:            No immediate complications. Estimated blood loss:                            None. Estimated Blood Loss:     Estimated blood loss: none. Impression:               - One 8 mm polyp in the cecum, removed with a cold                            snare. Resected and retrieved.                           -  One 5 mm polyp in the transverse colon, removed                            with a cold biopsy forceps. Resected and retrieved.                           - A tattoo was seen at the hepatic flexure. A                            post-polypectomy scar was found at the tattoo site.                           - Internal hemorrhoids.                           - The examination was otherwise normal on direct                            and retroflexion views. Recommendation:           - Repeat colonoscopy in 3 years for surveillance.                           - Patient has a contact number available for                            emergencies. The signs and symptoms of potential                            delayed complications were discussed with the                            patient. Return to normal activities tomorrow.                            Written discharge instructions were provided to the                            patient.                           - Resume previous diet.                           - Continue present medications.                           - Await pathology results.                           - No aspirin, ibuprofen, naproxen, or other                            non-steroidal anti-inflammatory drugs for 2 weeks  after polyp removal. Ladene Artist, MD 06/06/2018 10:41:55 AM This report has been signed electronically.

## 2018-06-06 NOTE — Progress Notes (Signed)
Called to room to assist during endoscopic procedure.  Patient ID and intended procedure confirmed with present staff. Received instructions for my participation in the procedure from the performing physician.  

## 2018-06-07 ENCOUNTER — Telehealth: Payer: Self-pay

## 2018-06-07 NOTE — Telephone Encounter (Signed)
  Follow up Call-  Call back number 06/06/2018  Post procedure Call Back phone  # [phone]  Permission to leave phone message Yes  Some recent data might be hidden     Patient questions:  Do you have a fever, pain , or abdominal swelling? No. Pain Score  0 *  Have you tolerated food without any problems? Yes.    Have you been able to return to your normal activities? Yes.    Do you have any questions about your discharge instructions: Diet   No. Medications  No. Follow up visit  No.  Do you have questions or concerns about your Care? No.  Actions: * If pain score is 4 or above: No action needed, pain <4.

## 2018-06-23 ENCOUNTER — Telehealth: Payer: Self-pay | Admitting: Gastroenterology

## 2018-06-23 NOTE — Telephone Encounter (Signed)
H. pylori gastritis Hyperplastic gastric polyp (not precancerous) no further follow up needed Adenomatous colon polyps (precancerous). Colonoscopy in 05/2021 Pylera 3 po qid, 14 days Omeprazole 20 mg po bid, 14 days No path letter needed

## 2018-06-23 NOTE — Telephone Encounter (Signed)
Patient would like procedure results from 10/22

## 2018-06-23 NOTE — Telephone Encounter (Signed)
Pt calling for results from procedures. Please advise.

## 2018-06-26 MED ORDER — BIS SUBCIT-METRONID-TETRACYC 140-125-125 MG PO CAPS: 3.0000 | ORAL_CAPSULE | Freq: Three times a day (TID) | ORAL | 0 refills | Status: DC

## 2018-06-26 MED ORDER — OMEPRAZOLE 20 MG PO CPDR: 20.0000 mg | DELAYED_RELEASE_CAPSULE | Freq: Two times a day (BID) | ORAL | 0 refills | Status: DC

## 2018-06-26 NOTE — Telephone Encounter (Signed)
Patient notified rx sent I mailed her a copy at her request

## 2018-06-29 ENCOUNTER — Telehealth: Payer: Self-pay | Admitting: Gastroenterology

## 2018-06-30 MED ORDER — BISMUTH SUBSALICYLATE 262 MG PO CHEW: 524.0000 mg | CHEWABLE_TABLET | Freq: Four times a day (QID) | ORAL | 0 refills | Status: AC

## 2018-06-30 MED ORDER — DOXYCYCLINE HYCLATE 100 MG PO CAPS: 100.0000 mg | ORAL_CAPSULE | Freq: Two times a day (BID) | ORAL | 0 refills | Status: AC

## 2018-06-30 MED ORDER — METRONIDAZOLE 250 MG PO TABS: 250.0000 mg | ORAL_TABLET | Freq: Four times a day (QID) | ORAL | 0 refills | Status: AC

## 2018-06-30 NOTE — Telephone Encounter (Signed)
Alternative prescriptions sent to patient's pharmacy. Left a message for patient to return my call.

## 2018-06-30 NOTE — Telephone Encounter (Signed)
Patient notified of the prescriptions I sent to the pharmacy to replace Pylera and for her to take the omeprazole also x 14 days that was already sent in previously. Patient verbalized understanding.

## 2018-07-03 ENCOUNTER — Ambulatory Visit
Admission: RE | Admit: 2018-07-03 | Discharge: 2018-07-03 | Disposition: A | Discharge status: Home / Self Care | Payer: Medicare PPO | Source: Ambulatory Visit | Attending: Internal Medicine | Admitting: Internal Medicine

## 2018-07-03 DIAGNOSIS — Z1231 Encounter for screening mammogram for malignant neoplasm of breast: Secondary | ICD-10-CM

## 2018-07-18 ENCOUNTER — Encounter: Payer: Self-pay | Admitting: Internal Medicine

## 2018-07-18 ENCOUNTER — Encounter

## 2018-07-18 ENCOUNTER — Ambulatory Visit: Payer: Medicare PPO | Admitting: Internal Medicine

## 2018-07-18 VITALS — BP 132/78 | HR 98 | Temp 97.5°F | Ht 61.0 in | Wt 173.0 lb

## 2018-07-18 DIAGNOSIS — K635 Polyp of colon: Secondary | ICD-10-CM | POA: Diagnosis not present

## 2018-07-18 DIAGNOSIS — K921 Melena: Secondary | ICD-10-CM

## 2018-07-18 DIAGNOSIS — E119 Type 2 diabetes mellitus without complications: Secondary | ICD-10-CM | POA: Diagnosis not present

## 2018-07-18 DIAGNOSIS — Z23 Encounter for immunization: Secondary | ICD-10-CM | POA: Diagnosis not present

## 2018-07-18 DIAGNOSIS — D509 Iron deficiency anemia, unspecified: Secondary | ICD-10-CM

## 2018-07-18 DIAGNOSIS — K317 Polyp of stomach and duodenum: Secondary | ICD-10-CM

## 2018-07-18 DIAGNOSIS — K922 Gastrointestinal hemorrhage, unspecified: Secondary | ICD-10-CM

## 2018-07-18 HISTORY — DX: Polyp of stomach and duodenum: K31.7

## 2018-07-18 NOTE — Assessment & Plan Note (Signed)
CT ca scoring info given

## 2018-07-18 NOTE — Assessment & Plan Note (Addendum)
11/19 Dr Fuller Plan Repeat in EGD 3 years

## 2018-07-18 NOTE — Assessment & Plan Note (Signed)
On iron Polyps removed

## 2018-07-18 NOTE — Patient Instructions (Signed)
Cardiac CT calcium scoring test $150   Computed tomography, more commonly known as a CT or CAT scan, is a diagnostic medical imaging test. Like traditional x-rays, it produces multiple images or pictures of the inside of the body. The cross-sectional images generated during a CT scan can be reformatted in multiple planes. They can even generate three-dimensional images. These images can be viewed on a computer monitor, printed on film or by a 3D printer, or transferred to a CD or DVD. CT images of internal organs, bones, soft tissue and blood vessels provide greater detail than traditional x-rays, particularly of soft tissues and blood vessels. A cardiac CT scan for coronary calcium is a non-invasive way of obtaining information about the presence, location and extent of calcified plaque in the coronary arteries-the vessels that supply oxygen-containing blood to the heart muscle. Calcified plaque results when there is a build-up of fat and other substances under the inner layer of the artery. This material can calcify which signals the presence of atherosclerosis, a disease of the vessel [place], also called coronary artery disease (CAD). People with this disease have an increased risk for heart attacks. In addition, over time, progression of plaque build up (CAD) can narrow the arteries or even close off blood flow to the heart. The result may be chest pain, sometimes called "angina," or a heart attack. Because calcium is a marker of CAD, the amount of calcium detected on a cardiac CT scan is a helpful prognostic tool. The findings on cardiac CT are expressed as a calcium score. Another name for this test is coronary artery calcium scoring.  What are some common uses of the procedure? The goal of cardiac CT scan for calcium scoring is to determine if CAD is present and to what extent, even if there are no symptoms. It is a screening study that may be recommended by a physician for patients with risk factors  for CAD but no clinical symptoms. The major risk factors for CAD are: . high blood cholesterol levels  . family history of heart attacks  . diabetes  . high blood pressure  . cigarette smoking  . overweight or obese  . physical inactivity   A negative cardiac CT scan for calcium scoring shows no calcification within the coronary arteries. This suggests that CAD is absent or so minimal it cannot be seen by this technique. The chance of having a heart attack over the next two to five years is very low under these circumstances. A positive test means that CAD is present, regardless of whether or not the patient is experiencing any symptoms. The amount of calcification-expressed as the calcium score-may help to predict the likelihood of a myocardial infarction (heart attack) in the coming years and helps your medical doctor or cardiologist decide whether the patient may need to take preventive medicine or undertake other measures such as diet and exercise to lower the risk for heart attack. The extent of CAD is graded according to your calcium score:  Calcium Score  Presence of CAD  0 No evidence of CAD   1-10 Minimal evidence of CAD  11-100 Mild evidence of CAD  101-400 Moderate evidence of CAD  Over 400 Extensive evidence of CAD    

## 2018-07-18 NOTE — Addendum Note (Signed)
Addended by: Karren Cobble on: 07/18/2018 04:02 PM   Modules accepted: Orders

## 2018-07-18 NOTE — Assessment & Plan Note (Signed)
Resolved

## 2018-07-18 NOTE — Assessment & Plan Note (Signed)
Repeat EGD in 3 years

## 2018-07-18 NOTE — Assessment & Plan Note (Signed)
11/19 Dr Fuller Plan Repeat colon in 3 years

## 2018-07-18 NOTE — Progress Notes (Signed)
Subjective:  Patient ID: Monica Horn, female    DOB: 05/31/1946  Age: 72 y.o. MRN: [id number]  CC: No chief complaint on file.   HPI [name] presents for DM, HTN, GERD f/u F/u vag bleed - s/p D&C S/p gastric and colon polyps removed - Dr Fuller Plan  Outpatient Medications Prior to Visit  Medication Sig Dispense Refill  . AMBULATORY NON FORMULARY MEDICATION daily. Medication Name: iron supplement (1800) OTC    . amLODipine (NORVASC) 5 MG tablet TAKE 1 TABLET EVERY DAY (Patient taking differently: TAKE 1/2 tablet daily) 90 tablet 1  . aspirin 81 MG tablet Take by mouth daily.     . cetirizine (ZYRTEC) 10 MG tablet Take 1 tablet (10 mg total) by mouth daily as needed. 90 tablet 3  . Cholecalciferol (VITAMIN D3) 1000 UNITS tablet Take 1,000 Units by mouth daily.      . magic mouthwash SOLN Take 5 mLs by mouth 4 (four) times daily as needed (swish, hold and spit prn). 300 mL 1  . Multiple Vitamins-Minerals (WOMENS MULTIVITAMIN PLUS PO) daily.    Marland Kitchen omeprazole (PRILOSEC) 20 MG capsule Take 1 capsule (20 mg total) by mouth 2 (two) times daily. 28 capsule 0  . pantoprazole (PROTONIX) 40 MG tablet TAKE 1 TABLET EVERY DAY; NEED ANNUAL PHYSICAL 90 tablet 1   No facility-administered medications prior to visit.     ROS: Review of Systems  Constitutional: Negative for activity change, appetite change, chills, fatigue and unexpected weight change.  HENT: Negative for congestion, mouth sores and sinus pressure.   Eyes: Negative for visual disturbance.  Respiratory: Negative for cough and chest tightness.   Gastrointestinal: Negative for abdominal pain and nausea.  Genitourinary: Negative for difficulty urinating, frequency and vaginal pain.  Musculoskeletal: Negative for back pain and gait problem.  Skin: Negative for pallor and rash.  Neurological: Negative for dizziness, tremors, weakness, numbness and headaches.  Psychiatric/Behavioral: Negative for confusion, sleep disturbance and  suicidal ideas.    Objective:  BP 132/78 (BP Location: Right Arm, Patient Position: Sitting, Cuff Size: Normal)   Pulse 98   Temp (!) 97.5 F (36.4 C) (Oral)   Ht 5\' 1"  (1.549 m)   Wt 173 lb (78.5 kg)   SpO2 98%   BMI 32.69 kg/m   BP Readings from Last 3 Encounters:  07/18/18 132/78  06/06/18 124/73  04/06/18 108/66    Wt Readings from Last 3 Encounters:  07/18/18 173 lb (78.5 kg)  06/06/18 170 lb (77.1 kg)  04/06/18 170 lb (77.1 kg)    Physical Exam  Constitutional: She appears well-developed. No distress.  HENT:  Head: Normocephalic.  Right Ear: External ear normal.  Left Ear: External ear normal.  Nose: Nose normal.  Mouth/Throat: Oropharynx is clear and moist.  Eyes: Pupils are equal, round, and reactive to light. Conjunctivae are normal. Right eye exhibits no discharge. Left eye exhibits no discharge.  Neck: Normal range of motion. Neck supple. No JVD present. No tracheal deviation present. No thyromegaly present.  Cardiovascular: Normal rate, regular rhythm and normal heart sounds.  Pulmonary/Chest: No stridor. No respiratory distress. She has no wheezes.  Abdominal: Soft. Bowel sounds are normal. She exhibits no distension and no mass. There is no tenderness. There is no rebound and no guarding.  Musculoskeletal: She exhibits no edema or tenderness.  Lymphadenopathy:    She has no cervical adenopathy.  Neurological: She displays normal reflexes. No cranial nerve deficit. She exhibits normal muscle tone. Coordination normal.  Skin: No rash noted. No erythema.  Psychiatric: She has a normal mood and affect. Her behavior is normal. Judgment and thought content normal.    Lab Results  Component Value Date   WBC 6.7 02/24/2018   HGB 10.4 (L) 02/24/2018   HCT 33.9 (L) 02/24/2018   PLT 439.0 Repeated and verified X2. (H) 02/24/2018   GLUCOSE 108 (H) 06/24/2017   CHOL 192 06/24/2017   TRIG 126.0 06/24/2017   HDL 37.00 (L) 06/24/2017   LDLDIRECT 120.0 05/13/2015    LDLCALC 130 (H) 06/24/2017   ALT 22 06/24/2017   AST 20 06/24/2017   NA 139 06/24/2017   K 3.9 06/24/2017   CL 104 06/24/2017   CREATININE 0.95 06/24/2017   BUN 15 06/24/2017   CO2 28 06/24/2017   TSH 2.06 06/24/2017   INR 1.0 04/19/2007   HGBA1C 6.3 06/24/2017   MICROALBUR 1.5 02/06/2016    Mm 3d Screen Breast Bilateral  Result Date: 07/04/2018 CLINICAL DATA:  Screening. EXAM: DIGITAL SCREENING BILATERAL MAMMOGRAM WITH TOMO AND CAD COMPARISON:  Previous exam(s). ACR Breast Density Category b: There are scattered areas of fibroglandular density. FINDINGS: There are no findings suspicious for malignancy. Images were processed with CAD. IMPRESSION: No mammographic evidence of malignancy. A result letter of this screening mammogram will be mailed directly to the patient. RECOMMENDATION: Screening mammogram in one year. (Code:SM-B-01Y) BI-RADS CATEGORY  1: Negative. Electronically Signed   By: Everlean Alstrom M.D.   On: 07/04/2018 14:10    Assessment & Plan:   There are no diagnoses linked to this encounter.   No orders of the defined types were placed in this encounter.    Follow-up: No follow-ups on file.  Walker Kehr, MD

## 2018-08-17 ENCOUNTER — Telehealth: Payer: Self-pay | Admitting: Gastroenterology

## 2018-08-17 NOTE — Telephone Encounter (Signed)
Pt is requesting something for constipation sent to [facility] in [facility], she is requesting something inexpensive.

## 2018-08-17 NOTE — Telephone Encounter (Signed)
Informed patient since we have not seen her in the office for these issues that she would need to schedule a follow up visit with Dr. Fuller Plan before we can prescribe a medication. Informed patient she needs to increase her fluids and start over the counter Miralax mixing 17 grams in 8 oz of water daily and can titrate depending on her bowel movements. Patient verbalized understanding and wants to try this regimen before making an appointment.

## 2018-09-14 DIAGNOSIS — R87612 Low grade squamous intraepithelial lesion on cytologic smear of cervix (LGSIL): Secondary | ICD-10-CM | POA: Diagnosis not present

## 2018-09-15 ENCOUNTER — Ambulatory Visit (INDEPENDENT_AMBULATORY_CARE_PROVIDER_SITE_OTHER): Payer: Medicare Other | Admitting: *Deleted

## 2018-09-15 VITALS — BP 128/82 | HR 96 | Resp 17 | Ht 61.0 in | Wt 171.0 lb

## 2018-09-15 DIAGNOSIS — Z0000 Encounter for general adult medical examination without abnormal findings: Secondary | ICD-10-CM | POA: Diagnosis not present

## 2018-09-15 DIAGNOSIS — E119 Type 2 diabetes mellitus without complications: Secondary | ICD-10-CM | POA: Diagnosis not present

## 2018-09-15 NOTE — Progress Notes (Signed)
Medical screening examination/treatment/procedure(s) were performed by the Wellness Coach, RN. As primary care provider I was immediately available for consulation/collaboration. I agree with above documentation. Ashleigh Shambley, NP  

## 2018-09-15 NOTE — Progress Notes (Signed)
Subjective:   Monica Horn is a 73 y.o. female who presents for Medicare Annual (Subsequent) preventive examination.  Review of Systems:  No ROS.  Medicare Wellness Visit. Additional risk factors are reflected in the social history.  Cardiac Risk Factors include: advanced age (>62men, >33 women);diabetes mellitus;dyslipidemia;hypertension Sleep patterns: feels rested on waking,  gets up 1 times nightly to void and sleeps 7 hours nightly.    Home Safety/Smoke Alarms: Feels safe in home. Smoke alarms in place.  Living environment; residence and Firearm Safety: 2-story house. Lives with husband, no needs for DME, good support system Seat Belt Safety/Bike Helmet: Wears seat belt.     Objective:     Vitals: BP 128/82   Pulse 96   Resp 17   Ht 5\' 1"  (1.549 m)   Wt 171 lb (77.6 kg)   SpO2 100%   BMI 32.31 kg/m   Body mass index is 32.31 kg/m.  Advanced Directives 09/15/2018 09/09/2017 01/16/2015 01/03/2015  Does Patient Have a Medical Advance Directive? No No No No  Would patient like information on creating a medical advance directive? No - Patient declined Yes (ED - Information included in AVS) No - patient declined information -    Tobacco Social History   Tobacco Use  Smoking Status Former Smoker  . Last attempt to quit: 1979  . Years since quitting: 41.1  Smokeless Tobacco Never Used     Counseling given: Not Answered  Past Medical History:  Diagnosis Date  . Allergy   . Arthritis   . Blood transfusion   . Blood transfusion without reported diagnosis   . Colon polyps   . Diabetes mellitus    PT. DENIES- no medicines for this   . Fatty infiltration of liver   . GERD (gastroesophageal reflux disease)   . Hydradenitis 01/2009   Right S aureus  . Hyperlipidemia   . Hypertension   . Internal hemorrhoids   . Internal hemorrhoids   . Tubulovillous adenoma polyp of colon 03/2007   w/ focal high grade dysplasia, no carcinoma   Past Surgical History:  Procedure  Laterality Date  . CARPAL TUNNEL RELEASE     Lt  wrist, rt wrist 2014  . COLONOSCOPY    . POLYPECTOMY    . TUBAL LIGATION     Family History  Problem Relation Age of Onset  . Stroke Mother   . Hypertension Mother   . Lung cancer Mother   . Cancer Father        ? liver ca  . Alcohol abuse Father   . Hypertension Other   . Stroke Other   . Colon cancer Neg Hx   . Colon polyps Neg Hx   . Rectal cancer Neg Hx   . Stomach cancer Neg Hx    Social History   Socioeconomic History  . Marital status: Married    Spouse name: Not on file  . Number of children: Not on file  . Years of education: Not on file  . Highest education level: Not on file  Occupational History  . Not on file  Social Needs  . Financial resource strain: Not hard at all  . Food insecurity:    Worry: Never true    Inability: Never true  . Transportation needs:    Medical: No    Non-medical: No  Tobacco Use  . Smoking status: Former Smoker    Last attempt to quit: 1979    Years since quitting: 41.1  .  Smokeless tobacco: Never Used  Substance and Sexual Activity  . Alcohol use: Yes    Comment: occasional  . Drug use: No  . Sexual activity: Yes  Lifestyle  . Physical activity:    Days per week: 4 days    Minutes per session: 60 min  . Stress: Not at all  Relationships  . Social connections:    Talks on phone: More than three times a week    Gets together: More than three times a week    Attends religious service: More than 4 times per year    Active member of club or organization: Not on file    Attends meetings of clubs or organizations: More than 4 times per year    Relationship status: Married  Other Topics Concern  . Not on file  Social History Narrative  . Not on file    Outpatient Encounter Medications as of 09/15/2018  Medication Sig  . amLODipine (NORVASC) 5 MG tablet TAKE 1 TABLET EVERY DAY (Patient taking differently: TAKE 1/2 tablet daily)  . aspirin 81 MG tablet Take by mouth  daily.   . cetirizine (ZYRTEC) 10 MG tablet Take 1 tablet (10 mg total) by mouth daily as needed.  . Cholecalciferol (VITAMIN D3) 1000 UNITS tablet Take 1,000 Units by mouth daily.    . Multiple Vitamins-Minerals (WOMENS MULTIVITAMIN PLUS PO) daily.  . pantoprazole (PROTONIX) 40 MG tablet TAKE 1 TABLET EVERY DAY; NEED ANNUAL PHYSICAL  . [DISCONTINUED] AMBULATORY NON FORMULARY MEDICATION daily. Medication Name: iron supplement (1800) OTC  . [DISCONTINUED] magic mouthwash SOLN Take 5 mLs by mouth 4 (four) times daily as needed (swish, hold and spit prn). (Patient not taking: Reported on 09/15/2018)   No facility-administered encounter medications on file as of 09/15/2018.     Activities of Daily Living In your present state of health, do you have any difficulty performing the following activities: 09/15/2018  Hearing? N  Vision? N  Difficulty concentrating or making decisions? N  Walking or climbing stairs? N  Dressing or bathing? N  Doing errands, shopping? N  Preparing Food and eating ? N  Using the Toilet? N  In the past six months, have you accidently leaked urine? N  Do you have problems with loss of bowel control? N  Managing your Medications? N  Managing your Finances? N  Housekeeping or managing your Housekeeping? N  Some recent data might be hidden    Patient Care Team: Plotnikov, Evie Lacks, MD as PCP - General Ladene Artist, MD (Gastroenterology) Calvert Cantor, MD as Consulting Physician (Ophthalmology)    Assessment:   This is a routine wellness examination for Monica Horn. Physical assessment deferred to PCP.  Exercise Activities and Dietary recommendations Current Exercise Habits: Home exercise routine, Type of exercise: walking;strength training/weights;calisthenics, Time (Minutes): 45, Frequency (Times/Week): 4, Weekly Exercise (Minutes/Week): 180, Intensity: Mild, Exercise limited by: orthopedic condition(s)  Diet (meal preparation, eat out, water intake,  caffeinated beverages, dairy products, fruits and vegetables): in general, a "healthy" diet  , well balanced   Reviewed heart healthy and diabetic diet. Encouraged patient to increase daily water and healthy fluid intake. Relevant patient education assigned to patient using Emmi.  Goals    . Patient Stated     Continue to stimulate my mind to prevent dementia and challenge, write a book about ministry.  Be socially active as possible. Continue to [place], love family and life.    . Patient Stated     I want to write  my book, go to [facility] and become a notary, and decrease some of the committees I am involved in so that I can do more of what I want to do for myself.       Fall Risk Fall Risk  09/15/2018 09/09/2017 12/17/2016 02/06/2016 11/13/2014  Falls in the past year? 0 No Yes No No  Number falls in past yr: - - 1 - -  Injury with Fall? - - Yes - -   Depression Screen PHQ 2/9 Scores 09/15/2018 09/09/2017 12/17/2016 02/06/2016  PHQ - 2 Score 0 0 0 0  PHQ- 9 Score - 0 - -     Cognitive Function MMSE - Mini Mental State Exam 09/09/2017  Orientation to time 5  Orientation to Place 5  Registration 3  Attention/ Calculation 5  Recall 2  Language- name 2 objects 2  Language- repeat 1  Language- follow 3 step command 3  Language- read & follow direction 1  Write a sentence 1  Copy design 1  Total score 29       Ad8 score reviewed for issues:  Issues making decisions: no  Less interest in hobbies / activities: no  Repeats questions, stories (family complaining): no  Trouble using ordinary gadgets (microwave, computer, phone):no  Forgets the month or year: no  Mismanaging finances: no  Remembering appts: no  Daily problems with thinking and/or memory: no Ad8 score is= 0  Immunization History  Administered Date(s) Administered  . Influenza Split 06/13/2012  . Influenza Whole 07/24/2002, 05/22/2008, 06/06/2009, 07/27/2010  . Influenza, High Dose Seasonal PF 06/20/2017,  07/18/2018  . Influenza,inj,Quad PF,6+ Mos 08/03/2013, 08/02/2014, 06/27/2015, 05/04/2016  . Pneumococcal Conjugate-13 10/02/2015  . Pneumococcal Polysaccharide-23 06/27/2015  . Td 01/31/2009   Screening Tests Health Maintenance  Topic Date Due  . URINE MICROALBUMIN  02/05/2017  . HEMOGLOBIN A1C  12/22/2017  . OPHTHALMOLOGY EXAM  06/16/2018  . FOOT EXAM  09/09/2018  . TETANUS/TDAP  02/01/2019  . MAMMOGRAM  07/03/2020  . COLONOSCOPY  06/06/2021  . INFLUENZA VACCINE  Completed  . DEXA SCAN  Completed  . Hepatitis C Screening  Completed  . PNA vac Low Risk Adult  Completed      Plan:    Reviewed health maintenance screenings with patient today and relevant education, vaccines, and/or referrals were provided.   Continue doing brain stimulating activities (puzzles, reading, adult coloring books, staying active) to keep memory sharp.   Continue to eat heart healthy diet (full of fruits, vegetables, whole grains, lean protein, water--limit salt, fat, and sugar intake) and increase physical activity as tolerated.  I have personally reviewed and noted the following in the patient's chart:   . Medical and social history . Use of alcohol, tobacco or illicit drugs  . Current medications and supplements . Functional ability and status . Nutritional status . Physical activity . Advanced directives . List of other physicians . Vitals . Screenings to include cognitive, depression, and falls . Referrals and appointments  In addition, I have reviewed and discussed with patient certain preventive protocols, quality metrics, and best practice recommendations. A written personalized care plan for preventive services as well as general preventive health recommendations were provided to patient.     Michiel Cowboy, RN  09/15/2018

## 2018-09-15 NOTE — Patient Instructions (Addendum)
Continue doing brain stimulating activities (puzzles, reading, adult coloring books, staying active) to keep memory sharp.   Continue to eat heart healthy diet (full of fruits, vegetables, whole grains, lean protein, water--limit salt, fat, and sugar intake) and increase physical activity as tolerated.   Monica Horn , Thank you for taking time to come for your Medicare Wellness Visit. I appreciate your ongoing commitment to your health goals. Please review the following plan we discussed and let me know if I can assist you in the future.   These are the goals we discussed: Goals    . Patient Stated     Continue to stimulate my mind to prevent dementia and challenge, write a book about ministry.  Be socially active as possible. Continue to [place], love family and life.    . Patient Stated     I want to write my book, go to [facility] and become a notary, and decrease some of the committees I am involved in so that I can do more of what I want to do for myself.       This is a list of the screening recommended for you and due dates:  Health Maintenance  Topic Date Due  . Urine Protein Check  02/05/2017  . Hemoglobin A1C  12/22/2017  . Eye exam for diabetics  06/16/2018  . Complete foot exam   09/09/2018  . Tetanus Vaccine  02/01/2019  . Mammogram  07/03/2020  . Colon Cancer Screening  06/06/2021  . Flu Shot  Completed  . DEXA scan (bone density measurement)  Completed  .  Hepatitis C: One time screening is recommended by Center for Disease Control  (CDC) for  adults born from [birth year] through 1964.   Completed  . Pneumonia vaccines  Completed   Health Maintenance, Female Adopting a healthy lifestyle and getting preventive care can go a long way to promote health and wellness. Talk with your health care provider about what schedule of regular examinations is right for you. This is a good chance for you to check in with your provider about disease prevention and staying healthy. In between  checkups, there are plenty of things you can do on your own. Experts have done a lot of research about which lifestyle changes and preventive measures are most likely to keep you healthy. Ask your health care provider for more information. Weight and diet Eat a healthy diet  Be sure to include plenty of vegetables, fruits, low-fat dairy products, and lean protein.  Do not eat a lot of foods high in solid fats, added sugars, or salt.  Get regular exercise. This is one of the most important things you can do for your health. ? Most adults should exercise for at least 150 minutes each week. The exercise should increase your heart rate and make you sweat (moderate-intensity exercise). ? Most adults should also do strengthening exercises at least twice a week. This is in addition to the moderate-intensity exercise. Maintain a healthy weight  Body mass index (BMI) is a measurement that can be used to identify possible weight problems. It estimates body fat based on height and weight. Your health care provider can help determine your BMI and help you achieve or maintain a healthy weight.  For females 27 years of age and older: ? A BMI below 18.5 is considered underweight. ? A BMI of 18.5 to 24.9 is normal. ? A BMI of 25 to 29.9 is considered overweight. ? A BMI of 30 and  above is considered obese. Watch levels of cholesterol and blood lipids  You should start having your blood tested for lipids and cholesterol at 72 years of age, then have this test every 5 years.  You may need to have your cholesterol levels checked more often if: ? Your lipid or cholesterol levels are high. ? You are older than 72 years of age. ? You are at high risk for heart disease. Cancer screening Lung Cancer  Lung cancer screening is recommended for adults 55-54 years old who are at high risk for lung cancer because of a history of smoking.  A yearly low-dose CT scan of the lungs is recommended for people  who: ? Currently smoke. ? Have quit within the past 15 years. ? Have at least a 30-pack-year history of smoking. A pack year is smoking an average of one pack of cigarettes a day for 1 year.  Yearly screening should continue until it has been 15 years since you quit.  Yearly screening should stop if you develop a health problem that would prevent you from having lung cancer treatment. Breast Cancer  Practice breast self-awareness. This means understanding how your breasts normally appear and feel.  It also means doing regular breast self-exams. Let your health care provider know about any changes, no matter how small.  If you are in your 20s or 30s, you should have a clinical breast exam (CBE) by a health care provider every 1-3 years as part of a regular health exam.  If you are 44 or older, have a CBE every year. Also consider having a breast X-ray (mammogram) every year.  If you have a family history of breast cancer, talk to your health care provider about genetic screening.  If you are at high risk for breast cancer, talk to your health care provider about having an MRI and a mammogram every year.  Breast cancer gene (BRCA) assessment is recommended for women who have family members with BRCA-related cancers. BRCA-related cancers include: ? Breast. ? Ovarian. ? Tubal. ? Peritoneal cancers.  Results of the assessment will determine the need for genetic counseling and BRCA1 and BRCA2 testing. Cervical Cancer Your health care provider may recommend that you be screened regularly for cancer of the pelvic organs (ovaries, uterus, and vagina). This screening involves a pelvic examination, including checking for microscopic changes to the surface of your cervix (Pap test). You may be encouraged to have this screening done every 3 years, beginning at age 70.  For women ages 43-65, health care providers may recommend pelvic exams and Pap testing every 3 years, or they may recommend the  Pap and pelvic exam, combined with testing for human papilloma virus (HPV), every 5 years. Some types of HPV increase your risk of cervical cancer. Testing for HPV may also be done on women of any age with unclear Pap test results.  Other health care providers may not recommend any screening for nonpregnant women who are considered low risk for pelvic cancer and who do not have symptoms. Ask your health care provider if a screening pelvic exam is right for you.  If you have had past treatment for cervical cancer or a condition that could lead to cancer, you need Pap tests and screening for cancer for at least 20 years after your treatment. If Pap tests have been discontinued, your risk factors (such as having a new sexual partner) need to be reassessed to determine if screening should resume. Some women have medical problems that  increase the chance of getting cervical cancer. In these cases, your health care provider may recommend more frequent screening and Pap tests. Colorectal Cancer  This type of cancer can be detected and often prevented.  Routine colorectal cancer screening usually begins at 72 years of age and continues through 72 years of age.  Your health care provider may recommend screening at an earlier age if you have risk factors for colon cancer.  Your health care provider may also recommend using home test kits to check for hidden blood in the stool.  A small camera at the end of a tube can be used to examine your colon directly (sigmoidoscopy or colonoscopy). This is done to check for the earliest forms of colorectal cancer.  Routine screening usually begins at age 58.  Direct examination of the colon should be repeated every 5-10 years through 72 years of age. However, you may need to be screened more often if early forms of precancerous polyps or small growths are found. Skin Cancer  Check your skin from head to toe regularly.  Tell your health care provider about any new  moles or changes in moles, especially if there is a change in a mole's shape or color.  Also tell your health care provider if you have a mole that is larger than the size of a pencil eraser.  Always use sunscreen. Apply sunscreen liberally and repeatedly throughout the day.  Protect yourself by wearing long sleeves, pants, a wide-brimmed hat, and sunglasses whenever you are outside. Heart disease, diabetes, and high blood pressure  High blood pressure causes heart disease and increases the risk of stroke. High blood pressure is more likely to develop in: ? People who have blood pressure in the high end of the normal range (130-139/85-89 mm Hg). ? People who are overweight or obese. ? People who are African American.  If you are 8-42 years of age, have your blood pressure checked every 3-5 years. If you are 8 years of age or older, have your blood pressure checked every year. You should have your blood pressure measured twice-once when you are at a hospital or clinic, and once when you are not at a hospital or clinic. Record the average of the two measurements. To check your blood pressure when you are not at a hospital or clinic, you can use: ? An automated blood pressure machine at a pharmacy. ? A home blood pressure monitor.  If you are between 81 years and 54 years old, ask your health care provider if you should take aspirin to prevent strokes.  Have regular diabetes screenings. This involves taking a blood sample to check your fasting blood sugar level. ? If you are at a normal weight and have a low risk for diabetes, have this test once every three years after 72 years of age. ? If you are overweight and have a high risk for diabetes, consider being tested at a younger age or more often. Preventing infection Hepatitis B  If you have a higher risk for hepatitis B, you should be screened for this virus. You are considered at high risk for hepatitis B if: ? You were born in a country  where hepatitis B is common. Ask your health care provider which countries are considered high risk. ? Your parents were born in a high-risk country, and you have not been immunized against hepatitis B (hepatitis B vaccine). ? You have HIV or AIDS. ? You use needles to inject street drugs. ?  You live with someone who has hepatitis B. ? You have had sex with someone who has hepatitis B. ? You get hemodialysis treatment. ? You take certain medicines for conditions, including cancer, organ transplantation, and autoimmune conditions. Hepatitis C  Blood testing is recommended for: ? Everyone born from [birth year] through 1964. ? Anyone with known risk factors for hepatitis C. Sexually transmitted infections (STIs)  You should be screened for sexually transmitted infections (STIs) including gonorrhea and chlamydia if: ? You are sexually active and are younger than 72 years of age. ? You are older than 72 years of age and your health care provider tells you that you are at risk for this type of infection. ? Your sexual activity has changed since you were last screened and you are at an increased risk for chlamydia or gonorrhea. Ask your health care provider if you are at risk.  If you do not have HIV, but are at risk, it may be recommended that you take a prescription medicine daily to prevent HIV infection. This is called pre-exposure prophylaxis (PrEP). You are considered at risk if: ? You are sexually active and do not regularly use condoms or know the HIV status of your partner(s). ? You take drugs by injection. ? You are sexually active with a partner who has HIV. Talk with your health care provider about whether you are at high risk of being infected with HIV. If you choose to begin PrEP, you should first be tested for HIV. You should then be tested every 3 months for as long as you are taking PrEP. Pregnancy  If you are premenopausal and you may become pregnant, ask your health care provider  about preconception counseling.  If you may become pregnant, take 400 to 800 micrograms (mcg) of folic acid every day.  If you want to prevent pregnancy, talk to your health care provider about birth control (contraception). Osteoporosis and menopause  Osteoporosis is a disease in which the bones lose minerals and strength with aging. This can result in serious bone fractures. Your risk for osteoporosis can be identified using a bone density scan.  If you are 58 years of age or older, or if you are at risk for osteoporosis and fractures, ask your health care provider if you should be screened.  Ask your health care provider whether you should take a calcium or vitamin D supplement to lower your risk for osteoporosis.  Menopause may have certain physical symptoms and risks.  Hormone replacement therapy may reduce some of these symptoms and risks. Talk to your health care provider about whether hormone replacement therapy is right for you. Follow these instructions at home:  Schedule regular health, dental, and eye exams.  Stay current with your immunizations.  Do not use any tobacco products including cigarettes, chewing tobacco, or electronic cigarettes.  If you are pregnant, do not drink alcohol.  If you are breastfeeding, limit how much and how often you drink alcohol.  Limit alcohol intake to no more than 1 drink per day for nonpregnant women. One drink equals 12 ounces of beer, 5 ounces of [place], or 1 ounces of hard liquor.  Do not use street drugs.  Do not share needles.  Ask your health care provider for help if you need support or information about quitting drugs.  Tell your health care provider if you often feel depressed.  Tell your health care provider if you have ever been abused or do not feel safe at home. This information  is not intended to replace advice given to you by your health care provider. Make sure you discuss any questions you have with your health care  provider. Document Released: 02/15/2011 Document Revised: 01/08/2016 Document Reviewed: 05/06/2015 Elsevier Interactive Patient Education  2019 [employer].

## 2018-10-02 ENCOUNTER — Encounter: Payer: Self-pay | Admitting: Internal Medicine

## 2018-10-02 ENCOUNTER — Ambulatory Visit (INDEPENDENT_AMBULATORY_CARE_PROVIDER_SITE_OTHER): Payer: Medicare Other | Admitting: Internal Medicine

## 2018-10-02 VITALS — BP 126/80 | HR 100 | Temp 98.2°F | Ht 61.0 in | Wt 173.0 lb

## 2018-10-02 DIAGNOSIS — M25552 Pain in left hip: Secondary | ICD-10-CM | POA: Diagnosis not present

## 2018-10-02 DIAGNOSIS — E119 Type 2 diabetes mellitus without complications: Secondary | ICD-10-CM | POA: Diagnosis not present

## 2018-10-02 DIAGNOSIS — J329 Chronic sinusitis, unspecified: Secondary | ICD-10-CM

## 2018-10-02 DIAGNOSIS — M25559 Pain in unspecified hip: Secondary | ICD-10-CM | POA: Insufficient documentation

## 2018-10-02 DIAGNOSIS — J069 Acute upper respiratory infection, unspecified: Secondary | ICD-10-CM | POA: Diagnosis not present

## 2018-10-02 DIAGNOSIS — I10 Essential (primary) hypertension: Secondary | ICD-10-CM

## 2018-10-02 HISTORY — DX: Chronic sinusitis, unspecified: J32.9

## 2018-10-02 MED ORDER — AMLODIPINE BESYLATE 5 MG PO TABS: 2.5000 mg | ORAL_TABLET | Freq: Every day | ORAL | 3 refills | Status: DC

## 2018-10-02 MED ORDER — PANTOPRAZOLE SODIUM 40 MG PO TBEC: DELAYED_RELEASE_TABLET | ORAL | 1 refills | Status: DC

## 2018-10-02 MED ORDER — CEFDINIR 300 MG PO CAPS: 300.0000 mg | ORAL_CAPSULE | Freq: Two times a day (BID) | ORAL | 0 refills | Status: DC

## 2018-10-02 MED ORDER — ALIGN 4 MG PO CAPS: 1.0000 | ORAL_CAPSULE | Freq: Every day | ORAL | 0 refills | Status: DC

## 2018-10-02 NOTE — Progress Notes (Signed)
Subjective:  Patient ID: Monica Horn, female    DOB: Jun 21, 1946  Age: 73 y.o. MRN: [id number]  CC: No chief complaint on file.   HPI Monica Horn presents for URI sx's x 9 d - worse - green nasal d/c L THR - March 4th  Outpatient Medications Prior to Visit  Medication Sig Dispense Refill  . amLODipine (NORVASC) 5 MG tablet TAKE 1 TABLET EVERY DAY (Patient taking differently: TAKE 1/2 tablet daily) 90 tablet 1  . aspirin 81 MG tablet Take by mouth daily.     . cetirizine (ZYRTEC) 10 MG tablet Take 1 tablet (10 mg total) by mouth daily as needed. 90 tablet 3  . Cholecalciferol (VITAMIN D3) 1000 UNITS tablet Take 1,000 Units by mouth daily.      . Multiple Vitamins-Minerals (WOMENS MULTIVITAMIN PLUS PO) daily.    . pantoprazole (PROTONIX) 40 MG tablet TAKE 1 TABLET EVERY DAY; NEED ANNUAL PHYSICAL 90 tablet 1   No facility-administered medications prior to visit.     ROS: Review of Systems  Constitutional: Negative for activity change, appetite change, chills, fatigue and unexpected weight change.  HENT: Positive for congestion, postnasal drip, rhinorrhea, sinus pressure and sinus pain. Negative for mouth sores.   Eyes: Negative for visual disturbance.  Respiratory: Positive for cough. Negative for chest tightness.   Gastrointestinal: Negative for abdominal pain and nausea.  Genitourinary: Negative for difficulty urinating, frequency and vaginal pain.  Musculoskeletal: Positive for gait problem. Negative for back pain.  Skin: Negative for pallor and rash.  Neurological: Negative for dizziness, tremors, weakness, numbness and headaches.  Psychiatric/Behavioral: Negative for confusion, sleep disturbance and suicidal ideas.    Objective:  BP 126/80 (BP Location: Left Arm, Patient Position: Sitting, Cuff Size: Normal)   Pulse 100   Temp 98.2 F (36.8 C) (Oral)   Ht 5\' 1"  (1.549 m)   Wt 173 lb (78.5 kg)   SpO2 96%   BMI 32.69 kg/m   BP Readings from Last 3 Encounters:    10/02/18 126/80  09/15/18 128/82  07/18/18 132/78    Wt Readings from Last 3 Encounters:  10/02/18 173 lb (78.5 kg)  09/15/18 171 lb (77.6 kg)  07/18/18 173 lb (78.5 kg)    Physical Exam Constitutional:      General: She is not in acute distress.    Appearance: She is well-developed.  HENT:     Head: Normocephalic.     Right Ear: External ear normal.     Left Ear: External ear normal.     Nose: Nose normal.  Eyes:     General:        Right eye: No discharge.        Left eye: No discharge.     Conjunctiva/sclera: Conjunctivae normal.     Pupils: Pupils are equal, round, and reactive to light.  Neck:     Musculoskeletal: Normal range of motion and neck supple.     Thyroid: No thyromegaly.     Vascular: No JVD.     Trachea: No tracheal deviation.  Cardiovascular:     Rate and Rhythm: Normal rate and regular rhythm.     Heart sounds: Normal heart sounds.  Pulmonary:     Effort: No respiratory distress.     Breath sounds: No stridor. No wheezing.  Abdominal:     General: Bowel sounds are normal. There is no distension.     Palpations: Abdomen is soft. There is no mass.     Tenderness:  There is no abdominal tenderness. There is no guarding or rebound.  Musculoskeletal:        General: No tenderness.  Lymphadenopathy:     Cervical: No cervical adenopathy.  Skin:    Findings: No erythema or rash.  Neurological:     Cranial Nerves: No cranial nerve deficit.     Motor: No abnormal muscle tone.     Coordination: Coordination normal.     Deep Tendon Reflexes: Reflexes normal.  Psychiatric:        Behavior: Behavior normal.        Thought Content: Thought content normal.        Judgment: Judgment normal.   eryth nasal lining  Lab Results  Component Value Date   WBC 6.7 02/24/2018   HGB 10.4 (L) 02/24/2018   HCT 33.9 (L) 02/24/2018   PLT 439.0 Repeated and verified X2. (H) 02/24/2018   GLUCOSE 108 (H) 06/24/2017   CHOL 192 06/24/2017   TRIG 126.0 06/24/2017    HDL 37.00 (L) 06/24/2017   LDLDIRECT 120.0 05/13/2015   LDLCALC 130 (H) 06/24/2017   ALT 22 06/24/2017   AST 20 06/24/2017   NA 139 06/24/2017   K 3.9 06/24/2017   CL 104 06/24/2017   CREATININE 0.95 06/24/2017   BUN 15 06/24/2017   CO2 28 06/24/2017   TSH 2.06 06/24/2017   INR 1.0 04/19/2007   HGBA1C 6.3 06/24/2017   MICROALBUR 1.5 02/06/2016    Mm 3d Screen Breast Bilateral  Result Date: 07/04/2018 CLINICAL DATA:  Screening. EXAM: DIGITAL SCREENING BILATERAL MAMMOGRAM WITH TOMO AND CAD COMPARISON:  Previous exam(s). ACR Breast Density Category b: There are scattered areas of fibroglandular density. FINDINGS: There are no findings suspicious for malignancy. Images were processed with CAD. IMPRESSION: No mammographic evidence of malignancy. A result letter of this screening mammogram will be mailed directly to the patient. RECOMMENDATION: Screening mammogram in one year. (Code:SM-B-01Y) BI-RADS CATEGORY  1: Negative. Electronically Signed   By: Everlean Alstrom M.D.   On: 07/04/2018 14:10    Assessment & Plan:   There are no diagnoses linked to this encounter.   No orders of the defined types were placed in this encounter.    Follow-up: No follow-ups on file.  Walker Kehr, MD

## 2018-10-02 NOTE — Assessment & Plan Note (Signed)
BP Readings from Last 3 Encounters:  10/02/18 126/80  09/15/18 128/82  07/18/18 132/78

## 2018-10-02 NOTE — Assessment & Plan Note (Signed)
  On diet  

## 2018-10-02 NOTE — Assessment & Plan Note (Signed)
Dr Wynelle Link L hip - [facility] 10/18/18 pending

## 2018-10-02 NOTE — Assessment & Plan Note (Signed)
Acute sinusitis Cefdinir x 10 d [employer]

## 2018-10-04 NOTE — H&P (Signed)
TOTAL HIP ADMISSION H&P  Patient is admitted for left total hip arthroplasty.  Subjective:  Chief Complaint: left hip pain  HPI: Monica Horn, 73 y.o. female, has a history of pain and functional disability in the left hip(s) due to arthritis and patient has failed non-surgical conservative treatments for greater than 12 weeks to include NSAID's and/or analgesics and activity modification.  Onset of symptoms was gradual starting 8 years ago with gradually worsening course since that time.The patient noted no past surgery on the left hip(s).  Patient currently rates pain in the left hip at 8 out of 10 with activity. Patient has worsening of pain with activity and weight bearing, pain that interfers with activities of daily living and instability. Patient has evidence of central and inferior bone-on-bone arthritis by imaging studies. This condition presents safety issues increasing the risk of falls. There is no current active infection.  Patient Active Problem List   Diagnosis Date Noted  . Hip pain 10/02/2018  . Gastric polyp 07/18/2018  . Colon polyps 07/18/2018  . Nose fracture 12/17/2016  . Forehead contusion, initial encounter 11/19/2016  . Well adult exam 04/09/2016  . Ganglion cyst of wrist 11/11/2015  . Allergic rhinitis 11/11/2015  . Cough 10/24/2014  . Ingrown right big toenail 09/27/2014  . Arthralgia 07/02/2014  . Rash and nonspecific skin eruption 04/05/2014  . UTI (urinary tract infection) 01/04/2014  . Grief 05/10/2013  . Near syncope 10/13/2012  . Chest pain, atypical 08/26/2012  . GERD (gastroesophageal reflux disease) 08/26/2012  . Abnormal EKG 08/26/2012  . Foot pain 06/13/2012  . URI, acute 06/11/2011  . Finger contusion 11/30/2010  . Shoulder pain, right 11/30/2010  . OTITIS EXTERNA 07/27/2010  . Cerumen impaction 12/01/2009  . CYSTITIS 09/08/2009  . DIZZINESS 04/04/2009  . CELLULITIS AND ABSCESS OF OTHER SPECIFIED SITE 01/31/2009  . ACUTE TONSILLITIS  08/05/2008  . Edema 03/22/2008  . ANEMIA 10/27/2007  . Carpal tunnel syndrome 10/27/2007  . HEMORRHOIDS 10/27/2007  . Blood in stool 10/27/2007  . Hemorrhage of gastrointestinal tract 10/27/2007  . Osteoarthritis 10/27/2007  . Other abnormal glucose 10/24/2007  . ANEMIA, DUE TO BLOOD LOSS 07/11/2007  . Essential hypertension 07/11/2007  . History of colonic polyps 07/11/2007  . Dyslipidemia 06/20/2007  . Diabetes type 2, controlled ([place]) 06/12/2007   Past Medical History:  Diagnosis Date  . Allergy   . Arthritis   . Blood transfusion   . Blood transfusion without reported diagnosis   . Colon polyps   . Diabetes mellitus    PT. DENIES- no medicines for this   . Fatty infiltration of liver   . GERD (gastroesophageal reflux disease)   . Hydradenitis 01/2009   Right S aureus  . Hyperlipidemia   . Hypertension   . Internal hemorrhoids   . Internal hemorrhoids   . Tubulovillous adenoma polyp of colon 03/2007   w/ focal high grade dysplasia, no carcinoma    Past Surgical History:  Procedure Laterality Date  . CARPAL TUNNEL RELEASE     Lt  wrist, rt wrist 2014  . COLONOSCOPY    . POLYPECTOMY    . TUBAL LIGATION      No current facility-administered medications for this encounter.    Current Outpatient Medications  Medication Sig Dispense Refill Last Dose  . amLODipine (NORVASC) 5 MG tablet Take 0.5 tablets (2.5 mg total) by mouth daily. 45 tablet 3   . aspirin 81 MG tablet Take by mouth daily.    Taking  .  cefdinir (OMNICEF) 300 MG capsule Take 1 capsule (300 mg total) by mouth 2 (two) times daily. 20 capsule 0   . cetirizine (ZYRTEC) 10 MG tablet Take 1 tablet (10 mg total) by mouth daily as needed. 90 tablet 3 Taking  . Cholecalciferol (VITAMIN D3) 1000 UNITS tablet Take 1,000 Units by mouth daily.     Taking  . Multiple Vitamins-Minerals (WOMENS MULTIVITAMIN PLUS PO) daily.   Taking  . pantoprazole (PROTONIX) 40 MG tablet TAKE 1 TABLET EVERY DAY 90 tablet 1   .  Probiotic Product (ALIGN) 4 MG CAPS Take 1 capsule (4 mg total) by mouth daily. 30 capsule 0    Allergies  Allergen Reactions  . Benazepril     cough  . Losartan Other (See Comments)    Headache  . Lovastatin     achy    Social History   Tobacco Use  . Smoking status: Former Smoker    Last attempt to quit: 1979    Years since quitting: 41.1  . Smokeless tobacco: Never Used  Substance Use Topics  . Alcohol use: Yes    Comment: occasional    Family History  Problem Relation Age of Onset  . Stroke Mother   . Hypertension Mother   . Lung cancer Mother   . Cancer Father        ? liver ca  . Alcohol abuse Father   . Hypertension Other   . Stroke Other   . Colon cancer Neg Hx   . Colon polyps Neg Hx   . Rectal cancer Neg Hx   . Stomach cancer Neg Hx      Review of Systems  Constitutional: Negative for chills and fever.  HENT: Negative for congestion, sore throat and tinnitus.   Eyes: Negative for double vision, photophobia and pain.  Respiratory: Negative for cough, shortness of breath and wheezing.   Cardiovascular: Negative for chest pain, palpitations and orthopnea.  Gastrointestinal: Negative for heartburn, nausea and vomiting.  Genitourinary: Negative for dysuria, frequency and urgency.  Musculoskeletal: Positive for joint pain.  Neurological: Negative for dizziness, weakness and headaches.    Objective:  Physical Exam  Well nourished and well developed.  General: Alert and oriented x3, cooperative and pleasant, no acute distress.  Head: normocephalic, atraumatic, neck supple.  Eyes: EOMI.  Respiratory: breath sounds clear in all fields, no wheezing, rales, or rhonchi. Cardiovascular: Regular rate and rhythm, no murmurs, gallops or rubs.  Abdomen: non-tender to palpation and soft, normoactive bowel sounds. Musculoskeletal:  Left Hip Exam: ROM: Flexion to 90, No Internal Rotation, External Rotation 10-20, and abduction 20 without discomfort. There is no  tenderness over the greater trochanter bursa. There is no pain on provocative testing of the hip.  Calves soft and nontender. Motor function intact in LE. Strength 5/5 LE bilaterally. Neuro: Distal pulses 2+. Sensation to light touch intact in LE.  Vital signs in last 24 hours:  Blood pressure: 136/74 mmHg Pulse: 88 bpm  Labs:   Estimated body mass index is 32.69 kg/m as calculated from the following:   Height as of 10/02/18: 5\' 1"  (1.549 m).   Weight as of 10/02/18: 78.5 kg.   Imaging Review Plain radiographs demonstrate severe degenerative joint disease of the left hip(s). The bone quality appears to be adequate for age and reported activity level.      Assessment/Plan:  End stage arthritis, left hip(s)  The patient history, physical examination, clinical judgement of the provider and imaging studies are consistent with  end stage degenerative joint disease of the left hip(s) and total hip arthroplasty is deemed medically necessary. The treatment options including medical management, injection therapy, arthroscopy and arthroplasty were discussed at length. The risks and benefits of total hip arthroplasty were presented and reviewed. The risks due to aseptic loosening, infection, stiffness, dislocation/subluxation,  thromboembolic complications and other imponderables were discussed.  The patient acknowledged the explanation, agreed to proceed with the plan and consent was signed. Patient is being admitted for inpatient treatment for surgery, pain control, PT, OT, prophylactic antibiotics, VTE prophylaxis, progressive ambulation and ADL's and discharge planning.The patient is planning to be discharged home.  Therapy Plans: HEP Disposition: Home with husband Planned DVT Prophylaxis: Aspirin 325 mg BID DME needed: None PCP: Lew Dawes, MD TXA: IV Allergies: Benazepril, losartan, lovastatin Anesthesia Concerns: None BMI: 32.1  - Patient was instructed on what medications to  stop prior to surgery. - Follow-up visit in 2 weeks with Dr. Wynelle Link - Begin physical therapy following surgery - Pre-operative lab work as pre-surgical testing - Prescriptions will be provided in hospital at time of discharge  Theresa Duty, PA-C Orthopedic Surgery [facility] [place]

## 2018-10-10 ENCOUNTER — Other Ambulatory Visit (HOSPITAL_COMMUNITY): Payer: Self-pay | Admitting: *Deleted

## 2018-10-10 ENCOUNTER — Encounter (HOSPITAL_COMMUNITY): Payer: Self-pay

## 2018-10-10 NOTE — Patient Instructions (Addendum)
Monica Horn    Your procedure is scheduled on: 10-18-2018  Report to [facility] Main  Entrance  Report to admitting at 1245 PM    Call this number if you have problems the morning of surgery [phone]    Remember: Do not eat food :After Midnight.  CLEAR LIQUIDS FROM MIDNIGHT UNTIL 915 AM. NOTHING BY MOUTH AFTER 915 AM DAY OF SURGERY.BRUSH YOUR TEETH MORNING OF SURGERY AND RINSE YOUR MOUTH OUT, NO CHEWING GUM CANDY OR MINTS.     CLEAR LIQUID DIET   Foods Allowed                                                                     Foods Excluded  Coffee and tea, regular and decaf                             liquids that you cannot  Plain Jell-O in any flavor                                             see through such as: Fruit ices (not with fruit pulp)                                     milk, soups, orange juice  Iced Popsicles                                    All solid food Carbonated beverages, regular and diet                                    Cranberry, grape and apple juices Sports drinks like Gatorade Lightly seasoned clear broth or consume(fat free) Sugar, honey syrup  Sample Menu Breakfast                                Lunch                                     Supper Cranberry juice                    Beef broth                            Chicken broth Jell-O                                     Grape juice  Apple juice Coffee or tea                        Jell-O                                      Popsicle                                                Coffee or tea                        Coffee or tea  _____________________________________________________________________     Take these medicines the morning of surgery with A SIP OF WATER: AMLODIPINE (NORVASC), CEFDINIR(OMNICEF), PANTAPRAZOLE (PROTONIX)               You may not have any metal on your body including hair pins and              piercings  Do  not wear jewelry, make-up, lotions, powders or perfumes, deodorant             Do not wear nail polish.  Do not shave  48 hours prior to surgery.                Do not bring valuables to the hospital. [place].  Contacts, dentures or bridgework may not be worn into surgery.  Leave suitcase in the car. After surgery it may be brought to your room.     _____________________________________________________________________  [facility] - Preparing for Surgery Before surgery, you can play an important role.  Because skin is not sterile, your skin needs to be as free of germs as possible.  You can reduce the number of germs on your skin by washing with CHG (chlorahexidine gluconate) soap before surgery.  CHG is an antiseptic cleaner which kills germs and bonds with the skin to continue killing germs even after washing. Please DO NOT use if you have an allergy to CHG or antibacterial soaps.  If your skin becomes reddened/irritated stop using the CHG and inform your nurse when you arrive at Short Stay. Do not shave (including legs and underarms) for at least 48 hours prior to the first CHG shower.  You may shave your face/neck. Please follow these instructions carefully:  1.  Shower with CHG Soap the night before surgery and the  morning of Surgery.  2.  If you choose to wash your hair, wash your hair first as usual with your  normal  shampoo.  3.  After you shampoo, rinse your hair and body thoroughly to remove the  shampoo.                           4.  Use CHG as you would any other liquid soap.  You can apply chg directly  to the skin and wash                       Gently with a scrungie or clean washcloth.  5.  Apply the CHG Soap to your body  ONLY FROM THE NECK DOWN.   Do not use on face/ open                           Wound or open sores. Avoid contact with eyes, ears mouth and genitals (private parts).                       Wash face,  Genitals  (private parts) with your normal soap.             6.  Wash thoroughly, paying special attention to the area where your surgery  will be performed.  7.  Thoroughly rinse your body with warm water from the neck down.  8.  DO NOT shower/wash with your normal soap after using and rinsing off  the CHG Soap.                9.  Pat yourself dry with a clean towel.            10.  Wear clean pajamas.            11.  Place clean sheets on your bed the night of your first shower and do not  sleep with pets. Day of Surgery : Do not apply any lotions/deodorants the morning of surgery.  Please wear clean clothes to the hospital/surgery center.  FAILURE TO FOLLOW THESE INSTRUCTIONS MAY RESULT IN THE CANCELLATION OF YOUR SURGERY PATIENT SIGNATURE_________________________________  NURSE SIGNATURE__________________________________  ________________________________________________________________________   Adam Phenix  An incentive spirometer is a tool that can help keep your lungs clear and active. This tool measures how well you are filling your lungs with each breath. Taking long deep breaths may help reverse or decrease the chance of developing breathing (pulmonary) problems (especially infection) following:  A long period of time when you are unable to move or be active. BEFORE THE PROCEDURE   If the spirometer includes an indicator to show your best effort, your nurse or respiratory therapist will set it to a desired goal.  If possible, sit up straight or lean slightly forward. Try not to slouch.  Hold the incentive spirometer in an upright position. INSTRUCTIONS FOR USE  1. Sit on the edge of your bed if possible, or sit up as far as you can in bed or on a chair. 2. Hold the incentive spirometer in an upright position. 3. Breathe out normally. 4. Place the mouthpiece in your mouth and seal your lips tightly around it. 5. Breathe in slowly and as deeply as possible, raising the  piston or the ball toward the top of the column. 6. Hold your breath for 3-5 seconds or for as long as possible. Allow the piston or ball to fall to the bottom of the column. 7. Remove the mouthpiece from your mouth and breathe out normally. 8. Rest for a few seconds and repeat Steps 1 through 7 at least 10 times every 1-2 hours when you are awake. Take your time and take a few normal breaths between deep breaths. 9. The spirometer may include an indicator to show your best effort. Use the indicator as a goal to work toward during each repetition. 10. After each set of 10 deep breaths, practice coughing to be sure your lungs are clear. If you have an incision (the cut made at the time of surgery), support your incision when coughing by placing a pillow or rolled up towels firmly against it.  Once you are able to get out of bed, walk around indoors and cough well. You may stop using the incentive spirometer when instructed by your caregiver.  RISKS AND COMPLICATIONS  Take your time so you do not get dizzy or light-headed.  If you are in pain, you may need to take or ask for pain medication before doing incentive spirometry. It is harder to take a deep breath if you are having pain. AFTER USE  Rest and breathe slowly and easily.  It can be helpful to keep track of a log of your progress. Your caregiver can provide you with a simple table to help with this. If you are using the spirometer at home, follow these instructions: [place] IF:   You are having difficultly using the spirometer.  You have trouble using the spirometer as often as instructed.  Your pain medication is not giving enough relief while using the spirometer.  You develop fever of 100.5 F (38.1 C) or higher. SEEK IMMEDIATE MEDICAL CARE IF:   You cough up bloody sputum that had not been present before.  You develop fever of 102 F (38.9 C) or greater.  You develop worsening pain at or near the incision  site. MAKE SURE YOU:   Understand these instructions.  Will watch your condition.  Will get help right away if you are not doing well or get worse. Document Released: 12/13/2006 Document Revised: 10/25/2011 Document Reviewed: 02/13/2007 ExitCare Patient Information 2014 ExitCare, [place].   ________________________________________________________________________  WHAT IS A BLOOD TRANSFUSION? Blood Transfusion Information  A transfusion is the replacement of blood or some of its parts. Blood is made up of multiple cells which provide different functions.  Red blood cells carry oxygen and are used for blood loss replacement.  White blood cells fight against infection.  Platelets control bleeding.  Plasma helps clot blood.  Other blood products are available for specialized needs, such as hemophilia or other clotting disorders. BEFORE THE TRANSFUSION  Who gives blood for transfusions?   Healthy volunteers who are fully evaluated to make sure their blood is safe. This is blood bank blood. Transfusion therapy is the safest it has ever been in the practice of medicine. Before blood is taken from a donor, a complete history is taken to make sure that person has no history of diseases nor engages in risky social behavior (examples are intravenous drug use or sexual activity with multiple partners). The donor's travel history is screened to minimize risk of transmitting infections, such as malaria. The donated blood is tested for signs of infectious diseases, such as HIV and hepatitis. The blood is then tested to be sure it is compatible with you in order to minimize the chance of a transfusion reaction. If you or a relative donates blood, this is often done in anticipation of surgery and is not appropriate for emergency situations. It takes many days to process the donated blood. RISKS AND COMPLICATIONS Although transfusion therapy is very safe and saves many lives, the main dangers of  transfusion include:   Getting an infectious disease.  Developing a transfusion reaction. This is an allergic reaction to something in the blood you were given. Every precaution is taken to prevent this. The decision to have a blood transfusion has been considered carefully by your caregiver before blood is given. Blood is not given unless the benefits outweigh the risks. AFTER THE TRANSFUSION  Right after receiving a blood transfusion, you will usually feel much better and more energetic.  This is especially true if your red blood cells have gotten low (anemic). The transfusion raises the level of the red blood cells which carry oxygen, and this usually causes an energy increase.  The nurse administering the transfusion will monitor you carefully for complications. HOME CARE INSTRUCTIONS  No special instructions are needed after a transfusion. You may find your energy is better. Speak with your caregiver about any limitations on activity for underlying diseases you may have. SEEK MEDICAL CARE IF:   Your condition is not improving after your transfusion.  You develop redness or irritation at the intravenous (IV) site. SEEK IMMEDIATE MEDICAL CARE IF:  Any of the following symptoms occur over the next 12 hours:  Shaking chills.  You have a temperature by mouth above 102 F (38.9 C), not controlled by medicine.  Chest, back, or muscle pain.  People around you feel you are not acting correctly or are confused.  Shortness of breath or difficulty breathing.  Dizziness and fainting.  You get a rash or develop hives.  You have a decrease in urine output.  Your urine turns a dark color or changes to pink, red, or brown. Any of the following symptoms occur over the next 10 days:  You have a temperature by mouth above 102 F (38.9 C), not controlled by medicine.  Shortness of breath.  Weakness after normal activity.  The white part of the eye turns yellow (jaundice).  You have a  decrease in the amount of urine or are urinating less often.  Your urine turns a dark color or changes to pink, red, or brown. Document Released: 07/30/2000 Document Revised: 10/25/2011 Document Reviewed: 03/18/2008 [facility] Patient Information 2014 [place], [place].  _______________________________________________________________________

## 2018-10-12 DIAGNOSIS — R87612 Low grade squamous intraepithelial lesion on cytologic smear of cervix (LGSIL): Secondary | ICD-10-CM | POA: Diagnosis not present

## 2018-10-12 DIAGNOSIS — N870 Mild cervical dysplasia: Secondary | ICD-10-CM | POA: Diagnosis not present

## 2018-10-13 ENCOUNTER — Encounter (HOSPITAL_COMMUNITY)
Admission: RE | Admit: 2018-10-13 | Discharge: 2018-10-13 | Disposition: A | Discharge status: Home / Self Care | Payer: Medicare Other | Source: Ambulatory Visit | Attending: Orthopedic Surgery | Admitting: Orthopedic Surgery

## 2018-10-13 ENCOUNTER — Other Ambulatory Visit: Payer: Self-pay

## 2018-10-13 ENCOUNTER — Encounter (HOSPITAL_COMMUNITY): Payer: Self-pay

## 2018-10-13 DIAGNOSIS — I10 Essential (primary) hypertension: Secondary | ICD-10-CM | POA: Diagnosis not present

## 2018-10-13 DIAGNOSIS — Z01818 Encounter for other preprocedural examination: Secondary | ICD-10-CM | POA: Diagnosis not present

## 2018-10-13 HISTORY — DX: Abnormal electrocardiogram (ECG) (EKG): R94.31

## 2018-10-13 HISTORY — DX: Chronic sinusitis, unspecified: J32.9

## 2018-10-13 LAB — APTT: aPTT: 31 seconds (ref 24–36)

## 2018-10-13 LAB — COMPREHENSIVE METABOLIC PANEL
ALT: 28 U/L (ref 0–44)
AST: 23 U/L (ref 15–41)
Albumin: 4.4 g/dL (ref 3.5–5.0)
Alkaline Phosphatase: 141 U/L — ABNORMAL HIGH (ref 38–126)
Anion gap: 7 (ref 5–15)
BUN: 13 mg/dL (ref 8–23)
CO2: 24 mmol/L (ref 22–32)
Calcium: 9.4 mg/dL (ref 8.9–10.3)
Chloride: 107 mmol/L (ref 98–111)
Creatinine, Ser: 0.92 mg/dL (ref 0.44–1.00)
GFR calc Af Amer: >60 mL/min (ref >60)
GFR calc non Af Amer: >60 mL/min (ref >60)
Glucose, Bld: 102 mg/dL — ABNORMAL HIGH (ref 70–99)
POTASSIUM: 3.8 mmol/L (ref 3.5–5.1)
SODIUM: 138 mmol/L (ref 135–145)
Total Bilirubin: 0.5 mg/dL (ref 0.3–1.2)
Total Protein: 8 g/dL (ref 6.5–8.1)

## 2018-10-13 LAB — CBC
HCT: 44.4 % (ref 36.0–46.0)
Hemoglobin: 12.5 g/dL (ref 12.0–15.0)
MCH: 22.7 pg — ABNORMAL LOW (ref 26.0–34.0)
MCHC: 28.2 g/dL — ABNORMAL LOW (ref 30.0–36.0)
MCV: 80.7 fL (ref 80.0–100.0)
Platelets: 377 10*3/uL (ref 150–400)
RBC: 5.5 MIL/uL — ABNORMAL HIGH (ref 3.87–5.11)
RDW: 14.3 % (ref 11.5–15.5)
WBC: 7.4 10*3/uL (ref 4.0–10.5)
nRBC: 0 % (ref 0.0–0.2)

## 2018-10-13 LAB — GLUCOSE, CAPILLARY: Glucose-Capillary: 110 mg/dL — ABNORMAL HIGH (ref 70–99)

## 2018-10-13 LAB — HEMOGLOBIN A1C
Hgb A1c MFr Bld: 6.3 % — ABNORMAL HIGH (ref 4.8–5.6)
Mean Plasma Glucose: 134.11 mg/dL

## 2018-10-13 LAB — SURGICAL PCR SCREEN
MRSA, PCR: NEGATIVE
Staphylococcus aureus: NEGATIVE

## 2018-10-13 LAB — PROTIME-INR
INR: 0.9 (ref 0.8–1.2)
Prothrombin Time: 12.4 seconds (ref 11.4–15.2)

## 2018-10-16 ENCOUNTER — Ambulatory Visit (INDEPENDENT_AMBULATORY_CARE_PROVIDER_SITE_OTHER)
Admission: RE | Admit: 2018-10-16 | Discharge: 2018-10-16 | Disposition: A | Discharge status: Home / Self Care | Payer: Medicare Other | Source: Ambulatory Visit | Attending: Internal Medicine | Admitting: Internal Medicine

## 2018-10-16 ENCOUNTER — Encounter (HOSPITAL_COMMUNITY): Payer: Self-pay

## 2018-10-16 ENCOUNTER — Telehealth: Payer: Self-pay | Admitting: Internal Medicine

## 2018-10-16 DIAGNOSIS — R05 Cough: Secondary | ICD-10-CM

## 2018-10-16 DIAGNOSIS — R059 Cough, unspecified: Secondary | ICD-10-CM

## 2018-10-16 MED ORDER — CEFDINIR 300 MG PO CAPS: 300.0000 mg | ORAL_CAPSULE | Freq: Two times a day (BID) | ORAL | 0 refills | Status: DC

## 2018-10-16 NOTE — Telephone Encounter (Signed)
Copied from [place] [id number]. Topic: Quick Communication - Rx Refill/Question >> Oct 16, 2018  8:48 AM Douglass Rivers L wrote: Medication: cefdinir (OMNICEF) 300 MG capsule  Has the patient contacted their pharmacy? Yes.    (Agent: If yes, when and what did the pharmacy advise?) Pt is out of refills  Preferred Pharmacy (with phone number or street name): [place], [place] - [address] [phone] (Phone) [phone] (Fax)    Agent: Please be advised that RX refills may take up to 3 business days. We ask that you follow-up with your pharmacy.

## 2018-10-16 NOTE — Progress Notes (Addendum)
Anesthesia Chart Review   Case:  [id number] Date/Time:  10/18/18 1500   Procedure:  TOTAL HIP ARTHROPLASTY ANTERIOR APPROACH (Left ) - 18min   Anesthesia type:  Choice   Pre-op diagnosis:  left hip osteoarthritis   Location:  [facility] ROOM 10 / [facility]S   Surgeon:  Gaynelle Arabian, MD      DISCUSSION: 73 yo former smoker (quit 08/16/77) with h/o GERD, HTN, HLD, DM II, left hip OA scheduled for above procedure 10/18/18 with Dr. Gaynelle Arabian.    Pt seen by PCP, Dr. Lew Dawes, on 10/02/18.  Diagnosed with sinus infection and started on abx.  Chronic conditions stable.  She called Dr. Alain Marion 10/16/18 complaining of persistent sx.  Chest Xray done on 10/16/18 due to persistent sx, normal exam.  Spoke with Dr. Judeen Hammans nurse today, she states he is ok with pt proceeding with surgery.    Ok to proceed with planned procedure barring acute status change, discussed with Dr. Valma Cava.  VS: BP (!) 138/36   Pulse 97   Temp 36.7 C (Oral)   Resp 16   Ht 5\' 1"  (1.549 m)   Wt 77.6 kg   SpO2 98%   BMI 32.31 kg/m   PROVIDERS: Plotnikov, Evie Lacks, MD is PCP    LABS: Labs reviewed: Acceptable for surgery. (all labs ordered are listed, but only abnormal results are displayed)  Labs Reviewed  GLUCOSE, CAPILLARY - Abnormal; Notable for the following components:      Result Value   Glucose-Capillary 110 (*)    All other components within normal limits  HEMOGLOBIN A1C - Abnormal; Notable for the following components:   Hgb A1c MFr Bld 6.3 (*)    All other components within normal limits  CBC - Abnormal; Notable for the following components:   RBC 5.50 (*)    MCH 22.7 (*)    MCHC 28.2 (*)    All other components within normal limits  COMPREHENSIVE METABOLIC PANEL - Abnormal; Notable for the following components:   Glucose, Bld 102 (*)    Alkaline Phosphatase 141 (*)    All other components within normal limits  SURGICAL PCR SCREEN  APTT  PROTIME-INR  TYPE AND SCREEN      IMAGES:   EKG: 10/13/2018 Rate 85 bpm Normal sinus rhythm  Minimal voltage criteria for LVH, may be normal variant T wave abnormality, consider inferolateral ischemia  Abnormal ACG No significant change since 11/23/12  CV: Echo 09/12/2012 Study Conclusions  Left ventricle: The cavity size was normal. Yzaguirre thickness was increased in a pattern of mild LVH. There was concentric hypertrophy. Systolic function was vigorous. The estimated ejection fraction was in the range of 65% to 70%. There was dynamic obstruction during Valsalvain the mid cavity, with mid-cavity obliteration, a peak velocity of 271cm/sec, and a peak gradient of 2mm Hg. Mayor motion was normal; there were no regional Eggert motion abnormalities. There was an increased relative contribution of atrial contraction to ventricular filling. Doppler parameters are consistent with abnormal left ventricular relaxation (grade 1 diastolic dysfunction). Past Medical History:  Diagnosis Date  . Abnormal EKG 2014   NONSPECIFIC ST DEPRESSION + T ANORMALITY, WORKED UP 2014 DR GREG TAYLOR AND RELEASED BY DR Lovena Le  . Allergy   . Arthritis   . Blood transfusion without reported diagnosis   . Colon polyps   . Diabetes mellitus    PT. DENIES- no medicines for this   . Fatty infiltration of liver   . GERD (gastroesophageal reflux disease)   .  Hydradenitis 01/2009   Right S aureus  . Hyperlipidemia   . Hypertension   . Internal hemorrhoids   . Sinus infection FEB 10-02-2018   FINISHING ANTIBIOTICS NOW  . Tubulovillous adenoma polyp of colon 03/2007   w/ focal high grade dysplasia, no carcinoma    Past Surgical History:  Procedure Laterality Date  . CARPAL TUNNEL RELEASE     Lt  wrist, rt wrist 2014  . COLONOSCOPY    . POLYPECTOMY    . TUBAL LIGATION      MEDICATIONS: . amLODipine (NORVASC) 5 MG tablet  . aspirin 81 MG tablet  . cefdinir (OMNICEF) 300 MG capsule  . cetirizine (ZYRTEC) 10 MG tablet  .  Cholecalciferol (VITAMIN D3) 1000 UNITS tablet  . Multiple Vitamins-Minerals (WOMENS MULTIVITAMIN PLUS PO)  . pantoprazole (PROTONIX) 40 MG tablet  . Probiotic Product (ALIGN) 4 MG CAPS  . trolamine salicylate (ASPERCREME) 10 % cream   No current facility-administered medications for this encounter.    Maia Plan [facility] Pre-Surgical Testing [phone] 10/17/18 2:29 PM

## 2018-10-16 NOTE — Telephone Encounter (Signed)
Left message for pt to return call to office to schedule appt for requested medication.

## 2018-10-16 NOTE — Telephone Encounter (Signed)
Still sick - needs abx renewed

## 2018-10-17 ENCOUNTER — Telehealth: Payer: Self-pay | Admitting: Internal Medicine

## 2018-10-17 NOTE — Anesthesia Preprocedure Evaluation (Addendum)
Anesthesia Evaluation  Patient identified by MRN, date of birth, ID band Patient awake    Reviewed: Allergy & Precautions, NPO status , Patient's Chart, lab work & pertinent test results  History of Anesthesia Complications Negative for: history of anesthetic complications  Airway Mallampati: III  TM Distance: >3 FB Neck ROM: Full    Dental  (+) Edentulous Upper, Edentulous Lower   Pulmonary former smoker (quit 1979),    breath sounds clear to auscultation       Cardiovascular hypertension, Pt. on medications (-) angina Rhythm:Regular Rate:Normal  '14 stress: Normal perfusion and LV function '14 ECHO: EF 65-70%, valves OK   Neuro/Psych negative neurological ROS     GI/Hepatic Neg liver ROS, GERD  Medicated and Controlled,  Endo/Other  diabetes (glu 110)  Renal/GU negative Renal ROS     Musculoskeletal  (+) Arthritis , Osteoarthritis,    Abdominal (+) + obese,   Peds  Hematology negative hematology ROS (+)   Anesthesia Other Findings   Reproductive/Obstetrics                           Anesthesia Physical Anesthesia Plan  ASA: II  Anesthesia Plan: Spinal   Post-op Pain Management:    Induction:   PONV Risk Score and Plan: 2 and Ondansetron, Treatment may vary due to age or medical condition and Dexamethasone  Airway Management Planned: Natural Airway and Simple Face Mask  Additional Equipment:   Intra-op Plan:   Post-operative Plan:   Informed Consent: I have reviewed the patients History and Physical, chart, labs and discussed the procedure including the risks, benefits and alternatives for the proposed anesthesia with the patient or authorized representative who has indicated his/her understanding and acceptance.     Dental advisory given  Plan Discussed with: CRNA and Surgeon  Anesthesia Plan Comments: (See PAT note 10/13/18, Konrad Felix, PA-C Plan routine  monitors, SAB)      Anesthesia Quick Evaluation

## 2018-10-17 NOTE — Telephone Encounter (Signed)
Pt stopped by office and Dr. Alain Marion printed off results

## 2018-10-17 NOTE — Telephone Encounter (Signed)
Patient calling to check status of this request. States that the scheduler for the surgery office keeps calling her needing to know. States that she is around the corner from the office, so she is going to stop by the office.

## 2018-10-17 NOTE — Telephone Encounter (Signed)
Patient has called stating that her hip surgery has been moved up to 10am tomorrow morning.  States that the surgeon needs to know the results of her x ray taken yesterday before they can proceed.  Patient hung up before call agent could get the name of the surgeon.  Please follow up with patient.

## 2018-10-18 ENCOUNTER — Inpatient Hospital Stay (HOSPITAL_COMMUNITY): Payer: Medicare Other

## 2018-10-18 ENCOUNTER — Other Ambulatory Visit: Payer: Self-pay

## 2018-10-18 ENCOUNTER — Inpatient Hospital Stay (HOSPITAL_COMMUNITY): Payer: Medicare Other | Admitting: Physician Assistant

## 2018-10-18 ENCOUNTER — Encounter (HOSPITAL_COMMUNITY)
Admission: RE | Disposition: A | Discharge status: Home / Self Care | Payer: Self-pay | Source: Home / Self Care | Attending: Orthopedic Surgery

## 2018-10-18 ENCOUNTER — Inpatient Hospital Stay (HOSPITAL_COMMUNITY)
Admission: RE | Admit: 2018-10-18 | Discharge: 2018-10-19 | DRG: 470 | Disposition: A | Discharge status: Home / Self Care | Payer: Medicare Other | Attending: Orthopedic Surgery | Admitting: Orthopedic Surgery

## 2018-10-18 ENCOUNTER — Inpatient Hospital Stay (HOSPITAL_COMMUNITY): Payer: Medicare Other | Admitting: Certified Registered Nurse Anesthetist

## 2018-10-18 ENCOUNTER — Encounter (HOSPITAL_COMMUNITY): Payer: Self-pay | Admitting: General Practice

## 2018-10-18 DIAGNOSIS — Z96642 Presence of left artificial hip joint: Secondary | ICD-10-CM | POA: Diagnosis not present

## 2018-10-18 DIAGNOSIS — Z96649 Presence of unspecified artificial hip joint: Secondary | ICD-10-CM

## 2018-10-18 DIAGNOSIS — E785 Hyperlipidemia, unspecified: Secondary | ICD-10-CM | POA: Diagnosis not present

## 2018-10-18 DIAGNOSIS — E669 Obesity, unspecified: Secondary | ICD-10-CM | POA: Diagnosis present

## 2018-10-18 DIAGNOSIS — E119 Type 2 diabetes mellitus without complications: Secondary | ICD-10-CM | POA: Diagnosis not present

## 2018-10-18 DIAGNOSIS — M1612 Unilateral primary osteoarthritis, left hip: Secondary | ICD-10-CM | POA: Diagnosis not present

## 2018-10-18 DIAGNOSIS — Z7982 Long term (current) use of aspirin: Secondary | ICD-10-CM

## 2018-10-18 DIAGNOSIS — M25752 Osteophyte, left hip: Secondary | ICD-10-CM | POA: Diagnosis present

## 2018-10-18 DIAGNOSIS — Z888 Allergy status to other drugs, medicaments and biological substances status: Secondary | ICD-10-CM

## 2018-10-18 DIAGNOSIS — Z9181 History of falling: Secondary | ICD-10-CM | POA: Diagnosis not present

## 2018-10-18 DIAGNOSIS — M169 Osteoarthritis of hip, unspecified: Secondary | ICD-10-CM | POA: Diagnosis present

## 2018-10-18 DIAGNOSIS — M25559 Pain in unspecified hip: Secondary | ICD-10-CM

## 2018-10-18 DIAGNOSIS — Z87891 Personal history of nicotine dependence: Secondary | ICD-10-CM | POA: Diagnosis not present

## 2018-10-18 DIAGNOSIS — I10 Essential (primary) hypertension: Secondary | ICD-10-CM | POA: Diagnosis not present

## 2018-10-18 DIAGNOSIS — Z79899 Other long term (current) drug therapy: Secondary | ICD-10-CM | POA: Diagnosis not present

## 2018-10-18 DIAGNOSIS — Z6832 Body mass index (BMI) 32.0-32.9, adult: Secondary | ICD-10-CM | POA: Diagnosis not present

## 2018-10-18 DIAGNOSIS — J309 Allergic rhinitis, unspecified: Secondary | ICD-10-CM | POA: Diagnosis not present

## 2018-10-18 DIAGNOSIS — Z471 Aftercare following joint replacement surgery: Secondary | ICD-10-CM | POA: Diagnosis not present

## 2018-10-18 DIAGNOSIS — K219 Gastro-esophageal reflux disease without esophagitis: Secondary | ICD-10-CM | POA: Diagnosis present

## 2018-10-18 HISTORY — PX: TOTAL HIP ARTHROPLASTY: SHX124

## 2018-10-18 HISTORY — DX: Osteoarthritis of hip, unspecified: M16.9

## 2018-10-18 LAB — TYPE AND SCREEN
ABO/RH(D): A POS
ANTIBODY SCREEN: NEGATIVE

## 2018-10-18 LAB — GLUCOSE, CAPILLARY: Glucose-Capillary: 120 mg/dL — ABNORMAL HIGH (ref 70–99)

## 2018-10-18 SURGERY — ARTHROPLASTY, HIP, TOTAL, ANTERIOR APPROACH
Anesthesia: Spinal | Site: Hip | Laterality: Left

## 2018-10-18 MED ORDER — HYDROMORPHONE HCL 1 MG/ML IJ SOLN: 0.2500 mg | INTRAMUSCULAR | Status: DC | PRN

## 2018-10-18 MED ORDER — FENTANYL CITRATE (PF) 100 MCG/2ML IJ SOLN
INTRAMUSCULAR | Status: AC
  Filled 2018-10-18: qty 2

## 2018-10-18 MED ORDER — MEPERIDINE HCL 50 MG/ML IJ SOLN: 6.2500 mg | INTRAMUSCULAR | Status: DC | PRN

## 2018-10-18 MED ORDER — CEFAZOLIN SODIUM-DEXTROSE 2-4 GM/100ML-% IV SOLN
2.0000 g | INTRAVENOUS | Status: AC
  Administered 2018-10-18: 2 g via INTRAVENOUS
  Filled 2018-10-18: qty 100

## 2018-10-18 MED ORDER — CEFDINIR 300 MG PO CAPS
300.0000 mg | ORAL_CAPSULE | Freq: Two times a day (BID) | ORAL | Status: DC
  Administered 2018-10-19: 300 mg via ORAL
  Filled 2018-10-18: qty 1

## 2018-10-18 MED ORDER — PANTOPRAZOLE SODIUM 40 MG PO TBEC
40.0000 mg | DELAYED_RELEASE_TABLET | Freq: Every day | ORAL | Status: DC
  Administered 2018-10-19: 40 mg via ORAL
  Filled 2018-10-18: qty 1

## 2018-10-18 MED ORDER — ONDANSETRON HCL 4 MG/2ML IJ SOLN
INTRAMUSCULAR | Status: AC
  Filled 2018-10-18: qty 2

## 2018-10-18 MED ORDER — MORPHINE SULFATE (PF) 2 MG/ML IV SOLN
0.5000 mg | INTRAVENOUS | Status: DC | PRN
  Administered 2018-10-18: 1 mg via INTRAVENOUS
  Filled 2018-10-18: qty 1

## 2018-10-18 MED ORDER — LORATADINE 10 MG PO TABS: 10.0000 mg | ORAL_TABLET | Freq: Every day | ORAL | Status: DC | PRN

## 2018-10-18 MED ORDER — PROMETHAZINE HCL 25 MG/ML IJ SOLN: 6.2500 mg | INTRAMUSCULAR | Status: DC | PRN

## 2018-10-18 MED ORDER — MIDAZOLAM HCL 2 MG/2ML IJ SOLN
INTRAMUSCULAR | Status: AC
  Filled 2018-10-18: qty 2

## 2018-10-18 MED ORDER — DOCUSATE SODIUM 100 MG PO CAPS
100.0000 mg | ORAL_CAPSULE | Freq: Two times a day (BID) | ORAL | Status: DC
  Administered 2018-10-18 – 2018-10-19 (×2): 100 mg via ORAL
  Filled 2018-10-18 (×2): qty 1

## 2018-10-18 MED ORDER — BISACODYL 10 MG RE SUPP: 10.0000 mg | Freq: Every day | RECTAL | Status: DC | PRN

## 2018-10-18 MED ORDER — BUPIVACAINE-EPINEPHRINE (PF) 0.25% -1:200000 IJ SOLN
INTRAMUSCULAR | Status: AC
  Filled 2018-10-18: qty 30

## 2018-10-18 MED ORDER — PHENYLEPHRINE HCL 10 MG/ML IJ SOLN
INTRAMUSCULAR | Status: AC
  Filled 2018-10-18: qty 1

## 2018-10-18 MED ORDER — BUPIVACAINE-EPINEPHRINE (PF) 0.25% -1:200000 IJ SOLN
INTRAMUSCULAR | Status: DC | PRN
  Administered 2018-10-18: 30 mL

## 2018-10-18 MED ORDER — FENTANYL CITRATE (PF) 100 MCG/2ML IJ SOLN
INTRAMUSCULAR | Status: DC | PRN
  Administered 2018-10-18: 25 ug via INTRAVENOUS

## 2018-10-18 MED ORDER — TRANEXAMIC ACID-NACL 1000-0.7 MG/100ML-% IV SOLN
1000.0000 mg | INTRAVENOUS | Status: AC
  Administered 2018-10-18: 1000 mg via INTRAVENOUS
  Filled 2018-10-18: qty 100

## 2018-10-18 MED ORDER — ACETAMINOPHEN 500 MG PO TABS
500.0000 mg | ORAL_TABLET | Freq: Four times a day (QID) | ORAL | Status: AC
  Administered 2018-10-18 – 2018-10-19 (×4): 500 mg via ORAL
  Filled 2018-10-18 (×4): qty 1

## 2018-10-18 MED ORDER — PHENOL 1.4 % MT LIQD: 1.0000 | OROMUCOSAL | Status: DC | PRN

## 2018-10-18 MED ORDER — MIDAZOLAM HCL 2 MG/2ML IJ SOLN: 0.5000 mg | Freq: Once | INTRAMUSCULAR | Status: DC | PRN

## 2018-10-18 MED ORDER — DEXAMETHASONE SODIUM PHOSPHATE 10 MG/ML IJ SOLN
8.0000 mg | Freq: Once | INTRAMUSCULAR | Status: AC
  Administered 2018-10-18: 10 mg via INTRAVENOUS

## 2018-10-18 MED ORDER — 0.9 % SODIUM CHLORIDE (POUR BTL) OPTIME
TOPICAL | Status: DC | PRN
  Administered 2018-10-18: 1000 mL

## 2018-10-18 MED ORDER — CEFAZOLIN SODIUM-DEXTROSE 2-4 GM/100ML-% IV SOLN
2.0000 g | Freq: Four times a day (QID) | INTRAVENOUS | Status: AC
  Administered 2018-10-18 (×2): 2 g via INTRAVENOUS
  Filled 2018-10-18 (×3): qty 100

## 2018-10-18 MED ORDER — METHOCARBAMOL 500 MG PO TABS
500.0000 mg | ORAL_TABLET | Freq: Four times a day (QID) | ORAL | Status: DC | PRN
  Administered 2018-10-18 – 2018-10-19 (×3): 500 mg via ORAL
  Filled 2018-10-18 (×3): qty 1

## 2018-10-18 MED ORDER — HYDROCODONE-ACETAMINOPHEN 5-325 MG PO TABS
1.0000 | ORAL_TABLET | ORAL | Status: DC | PRN
  Administered 2018-10-18 – 2018-10-19 (×2): 1 via ORAL
  Filled 2018-10-18 (×2): qty 1

## 2018-10-18 MED ORDER — BUPIVACAINE IN DEXTROSE 0.75-8.25 % IT SOLN
INTRATHECAL | Status: DC | PRN
  Administered 2018-10-18: 1.8 mL via INTRATHECAL

## 2018-10-18 MED ORDER — ONDANSETRON HCL 4 MG/2ML IJ SOLN
4.0000 mg | Freq: Four times a day (QID) | INTRAMUSCULAR | Status: DC | PRN
  Administered 2018-10-18 – 2018-10-19 (×2): 4 mg via INTRAVENOUS
  Filled 2018-10-18 (×2): qty 2

## 2018-10-18 MED ORDER — DEXAMETHASONE SODIUM PHOSPHATE 10 MG/ML IJ SOLN
INTRAMUSCULAR | Status: AC
  Filled 2018-10-18: qty 1

## 2018-10-18 MED ORDER — PHENYLEPHRINE HCL 10 MG/ML IJ SOLN
INTRAVENOUS | Status: DC | PRN
  Administered 2018-10-18: 35 ug/min via INTRAVENOUS

## 2018-10-18 MED ORDER — PHENYLEPHRINE 40 MCG/ML (10ML) SYRINGE FOR IV PUSH (FOR BLOOD PRESSURE SUPPORT)
PREFILLED_SYRINGE | INTRAVENOUS | Status: DC | PRN
  Administered 2018-10-18: 80 ug via INTRAVENOUS

## 2018-10-18 MED ORDER — ACETAMINOPHEN 10 MG/ML IV SOLN
1000.0000 mg | Freq: Once | INTRAVENOUS | Status: AC
  Administered 2018-10-18: 1000 mg via INTRAVENOUS
  Filled 2018-10-18: qty 100

## 2018-10-18 MED ORDER — RIVAROXABAN 10 MG PO TABS
10.0000 mg | ORAL_TABLET | Freq: Every day | ORAL | Status: DC
  Administered 2018-10-19: 10 mg via ORAL
  Filled 2018-10-18: qty 1

## 2018-10-18 MED ORDER — METOCLOPRAMIDE HCL 5 MG PO TABS: 5.0000 mg | ORAL_TABLET | Freq: Three times a day (TID) | ORAL | Status: DC | PRN

## 2018-10-18 MED ORDER — METOCLOPRAMIDE HCL 5 MG/ML IJ SOLN
5.0000 mg | Freq: Three times a day (TID) | INTRAMUSCULAR | Status: DC | PRN
  Administered 2018-10-18: 5 mg via INTRAVENOUS
  Filled 2018-10-18: qty 2

## 2018-10-18 MED ORDER — AMLODIPINE BESYLATE 5 MG PO TABS
2.5000 mg | ORAL_TABLET | Freq: Every day | ORAL | Status: DC
  Administered 2018-10-19: 2.5 mg via ORAL
  Filled 2018-10-18: qty 1

## 2018-10-18 MED ORDER — DIPHENHYDRAMINE HCL 12.5 MG/5ML PO ELIX: 12.5000 mg | ORAL_SOLUTION | ORAL | Status: DC | PRN

## 2018-10-18 MED ORDER — DEXAMETHASONE SODIUM PHOSPHATE 10 MG/ML IJ SOLN
10.0000 mg | Freq: Once | INTRAMUSCULAR | Status: AC
  Administered 2018-10-19: 10 mg via INTRAVENOUS
  Filled 2018-10-18: qty 1

## 2018-10-18 MED ORDER — PROPOFOL 10 MG/ML IV BOLUS
INTRAVENOUS | Status: AC
  Filled 2018-10-18: qty 60

## 2018-10-18 MED ORDER — METHOCARBAMOL 1000 MG/10ML IJ SOLN
500.0000 mg | Freq: Four times a day (QID) | INTRAVENOUS | Status: DC | PRN
  Filled 2018-10-18: qty 5

## 2018-10-18 MED ORDER — LACTATED RINGERS IV SOLN
INTRAVENOUS | Status: DC
  Administered 2018-10-18: 08:00:00 via INTRAVENOUS

## 2018-10-18 MED ORDER — STERILE WATER FOR IRRIGATION IR SOLN
Status: DC | PRN
  Administered 2018-10-18: 2000 mL

## 2018-10-18 MED ORDER — MENTHOL 3 MG MT LOZG: 1.0000 | LOZENGE | OROMUCOSAL | Status: DC | PRN

## 2018-10-18 MED ORDER — PROPOFOL 500 MG/50ML IV EMUL
INTRAVENOUS | Status: DC | PRN
  Administered 2018-10-18: 75 ug/kg/min via INTRAVENOUS

## 2018-10-18 MED ORDER — TRAMADOL HCL 50 MG PO TABS
50.0000 mg | ORAL_TABLET | Freq: Four times a day (QID) | ORAL | Status: DC | PRN
  Administered 2018-10-18: 100 mg via ORAL
  Administered 2018-10-19: 50 mg via ORAL
  Filled 2018-10-18: qty 2
  Filled 2018-10-18: qty 1

## 2018-10-18 MED ORDER — ACETAMINOPHEN 10 MG/ML IV SOLN: 1000.0000 mg | Freq: Four times a day (QID) | INTRAVENOUS | Status: DC

## 2018-10-18 MED ORDER — MIDAZOLAM HCL 5 MG/5ML IJ SOLN
INTRAMUSCULAR | Status: DC | PRN
  Administered 2018-10-18: 1 mg via INTRAVENOUS

## 2018-10-18 MED ORDER — FLEET ENEMA 7-19 GM/118ML RE ENEM: 1.0000 | ENEMA | Freq: Once | RECTAL | Status: DC | PRN

## 2018-10-18 MED ORDER — CHLORHEXIDINE GLUCONATE 4 % EX LIQD: 60.0000 mL | Freq: Once | CUTANEOUS | Status: DC

## 2018-10-18 MED ORDER — POLYETHYLENE GLYCOL 3350 17 G PO PACK: 17.0000 g | PACK | Freq: Every day | ORAL | Status: DC | PRN

## 2018-10-18 MED ORDER — TRANEXAMIC ACID-NACL 1000-0.7 MG/100ML-% IV SOLN
1000.0000 mg | Freq: Once | INTRAVENOUS | Status: AC
  Administered 2018-10-18: 1000 mg via INTRAVENOUS
  Filled 2018-10-18: qty 100

## 2018-10-18 MED ORDER — ONDANSETRON HCL 4 MG/2ML IJ SOLN
INTRAMUSCULAR | Status: DC | PRN
  Administered 2018-10-18: 4 mg via INTRAVENOUS

## 2018-10-18 MED ORDER — SODIUM CHLORIDE 0.9 % IV SOLN
INTRAVENOUS | Status: DC
  Administered 2018-10-18 (×2): via INTRAVENOUS

## 2018-10-18 MED ORDER — ONDANSETRON HCL 4 MG PO TABS: 4.0000 mg | ORAL_TABLET | Freq: Four times a day (QID) | ORAL | Status: DC | PRN

## 2018-10-18 SURGICAL SUPPLY — 44 items
BAG DECANTER FOR FLEXI CONT (MISCELLANEOUS) ×3 IMPLANT
BAG ZIPLOCK 12X15 (MISCELLANEOUS) IMPLANT
BLADE SAG 18X100X1.27 (BLADE) ×3 IMPLANT
BLADE SURG SZ10 CARB STEEL (BLADE) ×6 IMPLANT
CLOSURE STERI-STRIP 1/2X4 (GAUZE/BANDAGES/DRESSINGS) ×2
CLOSURE WOUND 1/2 X4 (GAUZE/BANDAGES/DRESSINGS) ×1
CLSR STERI-STRIP ANTIMIC 1/2X4 (GAUZE/BANDAGES/DRESSINGS) ×4 IMPLANT
COVER PERINEAL POST (MISCELLANEOUS) ×3 IMPLANT
COVER SURGICAL LIGHT HANDLE (MISCELLANEOUS) ×3 IMPLANT
COVER WAND RF STERILE (DRAPES) IMPLANT
CUP ACET PINNACLE SECTR 50MM (Hips) ×1 IMPLANT
DECANTER SPIKE VIAL GLASS SM (MISCELLANEOUS) ×3 IMPLANT
DRAPE STERI IOBAN 125X83 (DRAPES) ×3 IMPLANT
DRAPE U-SHAPE 47X51 STRL (DRAPES) ×6 IMPLANT
DRSG ADAPTIC 3X8 NADH LF (GAUZE/BANDAGES/DRESSINGS) ×3 IMPLANT
DRSG MEPILEX BORDER 4X4 (GAUZE/BANDAGES/DRESSINGS) ×3 IMPLANT
DRSG MEPILEX BORDER 4X8 (GAUZE/BANDAGES/DRESSINGS) ×3 IMPLANT
DURAPREP 26ML APPLICATOR (WOUND CARE) ×3 IMPLANT
ELECT REM PT RETURN 15FT ADLT (MISCELLANEOUS) ×3 IMPLANT
EVACUATOR 1/8 PVC DRAIN (DRAIN) ×3 IMPLANT
GLOVE BIO SURGEON STRL SZ7 (GLOVE) ×3 IMPLANT
GLOVE BIO SURGEON STRL SZ8 (GLOVE) ×3 IMPLANT
GLOVE BIOGEL PI IND STRL 7.0 (GLOVE) ×1 IMPLANT
GLOVE BIOGEL PI IND STRL 8 (GLOVE) ×1 IMPLANT
GLOVE BIOGEL PI INDICATOR 7.0 (GLOVE) ×2
GLOVE BIOGEL PI INDICATOR 8 (GLOVE) ×2
GOWN STRL REUS W/TWL LRG LVL3 (GOWN DISPOSABLE) ×6 IMPLANT
HEAD FEMORAL 32 CERAMIC (Hips) ×3 IMPLANT
HOLDER FOLEY CATH W/STRAP (MISCELLANEOUS) ×3 IMPLANT
LINER MARATHON 32 50 (Hips) ×3 IMPLANT
MANIFOLD NEPTUNE II (INSTRUMENTS) ×3 IMPLANT
PACK ANTERIOR HIP CUSTOM (KITS) ×3 IMPLANT
PINNACLE SECTOR CUP 50MM (Hips) ×3 IMPLANT
STEM FEM ACTIS HIGH SZ2 (Stem) ×3 IMPLANT
STRIP CLOSURE SKIN 1/2X4 (GAUZE/BANDAGES/DRESSINGS) ×2 IMPLANT
SUT ETHIBOND NAB CT1 #1 30IN (SUTURE) ×3 IMPLANT
SUT MNCRL AB 4-0 PS2 18 (SUTURE) ×3 IMPLANT
SUT STRATAFIX 0 PDS 27 VIOLET (SUTURE) ×3
SUT VIC AB 2-0 CT1 27 (SUTURE) ×4
SUT VIC AB 2-0 CT1 TAPERPNT 27 (SUTURE) ×2 IMPLANT
SUTURE STRATFX 0 PDS 27 VIOLET (SUTURE) ×1 IMPLANT
SYR 50ML LL SCALE MARK (SYRINGE) IMPLANT
TRAY FOLEY MTR SLVR 16FR STAT (SET/KITS/TRAYS/PACK) ×3 IMPLANT
YANKAUER SUCT BULB TIP 10FT TU (MISCELLANEOUS) ×3 IMPLANT

## 2018-10-18 NOTE — Transfer of Care (Signed)
Immediate Anesthesia Transfer of Care Note  Patient: Monica Horn  Procedure(s) Performed: TOTAL HIP ARTHROPLASTY ANTERIOR APPROACH (Left Hip)  Patient Location: PACU  Anesthesia Type:Spinal  Level of Consciousness: awake, alert  and oriented  Airway & Oxygen Therapy: Patient Spontanous Breathing and Patient connected to face mask oxygen  Post-op Assessment: Report given to RN  Post vital signs: Reviewed and stable  Last Vitals:  Vitals Value Taken Time  BP 80/61 10/18/2018 11:37 AM  Temp    Pulse 84 10/18/2018 11:36 AM  Resp 27 10/18/2018 11:38 AM  SpO2 83 % 10/18/2018 11:36 AM  Vitals shown include unvalidated device data.  Last Pain:  Vitals:   10/18/18 0805  TempSrc: Oral  PainSc: 0-No pain         Complications: No apparent anesthesia complications

## 2018-10-18 NOTE — Anesthesia Procedure Notes (Signed)
Spinal  Patient location during procedure: OR End time: 10/18/2018 9:56 AM Staffing Anesthesiologist: Annye Asa, MD Resident/CRNA: Maxwell Caul, CRNA Performed: resident/CRNA  Preanesthetic Checklist Completed: patient identified, site marked, surgical consent, pre-op evaluation, timeout performed, IV checked, risks and benefits discussed and monitors and equipment checked Spinal Block Patient position: sitting Prep: DuraPrep Patient monitoring: heart rate, cardiac monitor, continuous pulse ox and blood pressure Approach: midline Location: L3-4 Injection technique: single-shot Needle Needle type: Pencan  Needle gauge: 24 G Needle length: 10 cm Additional Notes Spinal kit checked-Expiration date checked and within date. Patient sitting on stretcher for SAB placement. Single shot, negative heme/negative paresthesia. Patient tolerated well.

## 2018-10-18 NOTE — Op Note (Signed)
OPERATIVE REPORT- TOTAL HIP ARTHROPLASTY   PREOPERATIVE DIAGNOSIS: Osteoarthritis of the Left hip.   POSTOPERATIVE DIAGNOSIS: Osteoarthritis of the Left  hip.   PROCEDURE: Left total hip arthroplasty, anterior approach.   SURGEON: Gaynelle Arabian, MD   ASSISTANT: Griffith Citron, PA-C  ANESTHESIA:  Spinal  ESTIMATED BLOOD LOSS:-100 mL    DRAINS: Hemovac x1.   COMPLICATIONS: None   CONDITION: PACU - hemodynamically stable.   BRIEF CLINICAL NOTE: Monica Horn is a 73 y.o. female who has advanced end-  stage arthritis of their Left  hip with progressively worsening pain and  dysfunction.The patient has failed nonoperative management and presents for  total hip arthroplasty.   PROCEDURE IN DETAIL: After successful administration of spinal  anesthetic, the traction boots for the [facility] bed were placed on both  feet and the patient was placed onto the [facility] bed, boots placed into the leg  holders. The Left hip was then isolated from the perineum with plastic  drapes and prepped and draped in the usual sterile fashion. ASIS and  greater trochanter were marked and a oblique incision was made, starting  at about 1 cm lateral and 2 cm distal to the ASIS and coursing towards  the anterior cortex of the femur. The skin was cut with a 10 blade  through subcutaneous tissue to the level of the fascia overlying the  tensor fascia lata muscle. The fascia was then incised in line with the  incision at the junction of the anterior third and posterior 2/3rd. The  muscle was teased off the fascia and then the interval between the TFL  and the rectus was developed. The Hohmann retractor was then placed at  the top of the femoral neck over the capsule. The vessels overlying the  capsule were cauterized and the fat on top of the capsule was removed.  A Hohmann retractor was then placed anterior underneath the rectus  femoris to give exposure to the entire anterior capsule. A T-shaped   capsulotomy was performed. The edges were tagged and the femoral head  was identified.       Osteophytes are removed off the superior acetabulum.  The femoral neck was then cut in situ with an oscillating saw. Traction  was then applied to the left lower extremity utilizing the [facility]  traction. The femoral head was then removed. Retractors were placed  around the acetabulum and then circumferential removal of the labrum was  performed. Osteophytes were also removed. Reaming starts at 45 mm to  medialize and  Increased in 2 mm increments to 49 mm. We reamed in  approximately 40 degrees of abduction, 20 degrees anteversion. A 50 mm  pinnacle acetabular shell was then impacted in anatomic position under  fluoroscopic guidance with excellent purchase. We did not need to place  any additional dome screws. A 32 mm neutral + 4 marathon liner was then  placed into the acetabular shell.       The femoral lift was then placed along the lateral aspect of the femur  just distal to the vastus ridge. The leg was  externally rotated and capsule  was stripped off the inferior aspect of the femoral neck down to the  level of the lesser trochanter, this was done with electrocautery. The femur was lifted after this was performed. The  leg was then placed in an extended and adducted position essentially delivering the femur. We also removed the capsule superiorly and the piriformis from the piriformis  fossa to gain excellent exposure of the  proximal femur. Rongeur was used to remove some cancellous bone to get  into the lateral portion of the proximal femur for placement of the  initial starter reamer. The starter broaches was placed  the starter broach  and was shown to go down the center of the canal. Broaching  with the Actis system was then performed starting at size 0  coursing  Up to size 2. A size 2 had excellent torsional and rotational  and axial stability. The trial high offset neck was then placed   with a 32 + 1 trial head. The hip was then reduced. We confirmed that  the stem was in the canal both on AP and lateral x-rays. It also has excellent sizing. The hip was reduced with outstanding stability through full extension and full external rotation.. AP pelvis was taken and the leg lengths were measured and found to be equal. Hip was then dislocated again and the femoral head and neck removed. The  femoral broach was removed. Size 2 Actis stem with a high offset  neck was then impacted into the femur following native anteversion. Has  excellent purchase in the canal. Excellent torsional and rotational and  axial stability. It is confirmed to be in the canal on AP and lateral  fluoroscopic views. The 32 + 1 ceramic head was placed and the hip  reduced with outstanding stability. Again AP pelvis was taken and it  confirmed that the leg lengths were equal. The wound was then copiously  irrigated with saline solution and the capsule reattached and repaired  with Ethibond suture. 30 ml of .25% Bupivicaine was  injected into the capsule and into the edge of the tensor fascia lata as well as subcutaneous tissue. The fascia overlying the tensor fascia lata was then closed with a running #1 V-Loc. Subcu was closed with interrupted 2-0 Vicryl and subcuticular running 4-0 Monocryl. Incision was cleaned  and dried. Steri-Strips and a bulky sterile dressing applied. Hemovac  drain was hooked to suction and then the patient was awakened and transported to  recovery in stable condition.        Please note that a surgical assistant was a medical necessity for this procedure to perform it in a safe and expeditious manner. Assistant was necessary to provide appropriate retraction of vital neurovascular structures and to prevent femoral fracture and allow for anatomic placement of the prosthesis.  Gaynelle Arabian, M.D.

## 2018-10-18 NOTE — Interval H&P Note (Signed)
History and Physical Interval Note:  10/18/2018 7:56 AM  Monica Horn  has presented today for surgery, with the diagnosis of left hip osteoarthritis  The various methods of treatment have been discussed with the patient and family. After consideration of risks, benefits and other options for treatment, the patient has consented to  Procedure(s) with comments: [place] (Left) - 185min as a surgical intervention .  The patient's history has been reviewed, patient examined, no change in status, stable for surgery.  I have reviewed the patient's chart and labs.  Questions were answered to the patient's satisfaction.     Pilar Plate Niah Heinle

## 2018-10-18 NOTE — Evaluation (Signed)
Physical Therapy Evaluation Patient Details Name: Monica Horn MRN: [id number] DOB: 07-01-1946 Today's Date: 10/18/2018   History of Present Illness  L DA-THA  Clinical Impression  Pt is s/p THA resulting in the deficits listed below (see PT Problem List). Pt ambulated 23' with RW, no loss of balance. Initiated HEP. Good progress expected.  Pt will benefit from skilled PT to increase their independence and safety with mobility to allow discharge to the venue listed below.      Follow Up Recommendations Follow surgeon's recommendation for DC plan and follow-up therapies(plan is home with HEP )    Equipment Recommendations  None recommended by PT    Recommendations for Other Services       Precautions / Restrictions Precautions Precautions: None Restrictions Weight Bearing Restrictions: No Other Position/Activity Restrictions: WBAT      Mobility  Bed Mobility Overal bed mobility: Needs Assistance Bed Mobility: Supine to Sit     Supine to sit: Min assist;HOB elevated     General bed mobility comments: min A to support LLE and to pivot hips to edge of bed  Transfers Overall transfer level: Needs assistance Equipment used: Rolling walker (2 wheeled) Transfers: Sit to/from Stand Sit to Stand: Min assist         General transfer comment: min A to rise, VCs hand placement  Ambulation/Gait Ambulation/Gait assistance: Min guard Gait Distance (Feet): 55 Feet Assistive device: Rolling walker (2 wheeled) Gait Pattern/deviations: Step-to pattern;Decreased stance time - left;Decreased stride length Gait velocity: decr   General Gait Details: VCs sequencing and to lift head, no loss of balance  Stairs            Wheelchair Mobility    Modified Rankin (Stroke Patients Only)       Balance Overall balance assessment: Modified Independent                                           Pertinent Vitals/Pain Pain Assessment: 0-10 Pain Score: 3   Pain Location: L hip with walking Pain Descriptors / Indicators: Sore Pain Intervention(s): Limited activity within patient's tolerance;Monitored during session;Premedicated before session;Ice applied    Home Living Family/patient expects to be discharged to:: Private residence Living Arrangements: Spouse/significant other Available Help at Discharge: Family   Home Access: Stairs to enter Entrance Stairs-Rails: Right Entrance Stairs-Number of Steps: 3 Home Layout: One level Home Equipment: Environmental consultant - 2 wheels      Prior Function Level of Independence: Independent         Comments: no falls in past year     Hand Dominance        Extremity/Trunk Assessment   Upper Extremity Assessment Upper Extremity Assessment: Overall WFL for tasks assessed    Lower Extremity Assessment Lower Extremity Assessment: LLE deficits/detail LLE Deficits / Details: Hip flexion and ABDuction AAROM decr ~50% limited by pain, knee ext at least 3/5 LLE Sensation: WNL       Communication      Cognition Arousal/Alertness: Awake/alert Behavior During Therapy: WFL for tasks assessed/performed Overall Cognitive Status: Within Functional Limits for tasks assessed                                        General Comments      Exercises Total Joint Exercises Ankle  Circles/Pumps: AROM;Both;10 reps;Supine Quad Sets: AROM;Both;5 reps;Supine Heel Slides: AAROM;Left;10 reps;Supine Hip ABduction/ADduction: AAROM;Left;10 reps;Supine Long Arc Quad: AROM;Left;10 reps;Seated   Assessment/Plan    PT Assessment Patient needs continued PT services  PT Problem List Decreased activity tolerance;Decreased strength;Decreased mobility;Pain;Decreased knowledge of use of DME       PT Treatment Interventions Gait training;DME instruction;Stair training;Functional mobility training;Therapeutic activities;Patient/family education;Therapeutic exercise    PT Goals (Current goals can be found in  the Care Plan section)  Acute Rehab PT Goals Patient Stated Goal: go to exercise classes at [facility] center PT Goal Formulation: With patient/family Time For Goal Achievement: 10/25/18 Potential to Achieve Goals: Good    Frequency 7X/week   Barriers to discharge        Co-evaluation               AM-PAC PT "6 Clicks" Mobility  Outcome Measure Help needed turning from your back to your side while in a flat bed without using bedrails?: A Little Help needed moving from lying on your back to sitting on the side of a flat bed without using bedrails?: A Little Help needed moving to and from a bed to a chair (including a wheelchair)?: A Little Help needed standing up from a chair using your arms (e.g., wheelchair or bedside chair)?: A Little Help needed to walk in hospital room?: A Little Help needed climbing 3-5 steps with a railing? : A Lot 6 Click Score: 17    End of Session Equipment Utilized During Treatment: Gait belt Activity Tolerance: Patient tolerated treatment well Patient left: in chair;with call bell/phone within reach;with family/visitor present Nurse Communication: Mobility status PT Visit Diagnosis: Difficulty in walking, not elsewhere classified (R26.2);Muscle weakness (generalized) (M62.81);Pain Pain - Right/Left: Left Pain - part of body: Hip    Time: 9038-3338 PT Time Calculation (min) (ACUTE ONLY): 34 min   Charges:   PT Evaluation $PT Eval Low Complexity: 1 Low PT Treatments $Gait Training: 8-22 mins        Blondell Reveal Kistler PT 10/18/2018  Acute Rehabilitation Services Pager [phone] Office [phone]

## 2018-10-18 NOTE — Anesthesia Postprocedure Evaluation (Signed)
Anesthesia Post Note  Patient: Auria J Curbow  Procedure(s) Performed: TOTAL HIP ARTHROPLASTY ANTERIOR APPROACH (Left Hip)     Patient location during evaluation: PACU Anesthesia Type: Spinal Level of consciousness: awake and alert, oriented and patient cooperative Pain management: pain level controlled Vital Signs Assessment: post-procedure vital signs reviewed and stable Respiratory status: spontaneous breathing, nonlabored ventilation and respiratory function stable Cardiovascular status: blood pressure returned to baseline and stable Postop Assessment: spinal receding and no apparent nausea or vomiting Anesthetic complications: no    Last Vitals:  Vitals:   10/18/18 1540 10/18/18 1630  BP: 132/89 129/88  Pulse: 96 95  Resp: 15 15  Temp: 36.6 C 36.7 C  SpO2: 99% 97%    Last Pain:  Vitals:   10/18/18 1540  TempSrc: Oral  PainSc:                  Zacheriah Stumpe,E. Jozelynn Danielson

## 2018-10-18 NOTE — Discharge Instructions (Addendum)
°Dr. Frank Aluisio °Total Joint Specialist °[facility] °[address] °[place], [place] [address] °[phone] ° °ANTERIOR APPROACH TOTAL HIP REPLACEMENT POSTOPERATIVE DIRECTIONS ° ° °Hip Rehabilitation, Guidelines Following Surgery  °The results of a hip operation are greatly improved after range of motion and muscle strengthening exercises. Follow all safety measures which are given to protect your hip. If any of these exercises cause increased pain or swelling in your joint, decrease the amount until you are comfortable again. Then slowly increase the exercises. Call your caregiver if you have problems or questions.  ° °HOME CARE INSTRUCTIONS  °• Remove items at home which could result in a fall. This includes throw rugs or furniture in walking pathways.  °· ICE to the affected hip every three hours for 30 minutes at a time and then as needed for pain and swelling.  Continue to use ice on the hip for pain and swelling from surgery. You may notice swelling that will progress down to the foot and ankle.  This is normal after surgery.  Elevate the leg when you are not up walking on it.   °· Continue to use the breathing machine which will help keep your temperature down.  It is common for your temperature to cycle up and down following surgery, especially at night when you are not up moving around and exerting yourself.  The breathing machine keeps your lungs expanded and your temperature down. ° °DIET °You may resume your previous home diet once your are discharged from the hospital. ° °DRESSING / WOUND CARE / SHOWERING °You may change your dressing 3-5 days after surgery.  Then change the dressing every day with sterile gauze.  Please use good hand washing techniques before changing the dressing.  Do not use any lotions or creams on the incision until instructed by your surgeon. °You may start showering once you are discharged home but do not submerge the incision under water. Just pat the  incision dry and apply a dry gauze dressing on daily. °Change the surgical dressing daily and reapply a dry dressing each time. ° °ACTIVITY °Walk with your walker as instructed. °Use walker as long as suggested by your caregivers. °Avoid periods of inactivity such as sitting longer than an hour when not asleep. This helps prevent blood clots.  °You may resume a sexual relationship in one month or when given the OK by your doctor.  °You may return to work once you are cleared by your doctor.  °Do not drive a car for 6 weeks or until released by you surgeon.  °Do not drive while taking narcotics. ° °WEIGHT BEARING °Weight bearing as tolerated with assist device (walker, cane, etc) as directed, use it as long as suggested by your surgeon or therapist, typically at least 4-6 weeks. ° °POSTOPERATIVE CONSTIPATION PROTOCOL °Constipation - defined medically as fewer than three stools per week and severe constipation as less than one stool per week. ° °One of the most common issues patients have following surgery is constipation.  Even if you have a regular bowel pattern at home, your normal regimen is likely to be disrupted due to multiple reasons following surgery.  Combination of anesthesia, postoperative narcotics, change in appetite and fluid intake all can affect your bowels.  In order to avoid complications following surgery, here are some recommendations in order to help you during your recovery period. ° °Colace (docusate) - Pick up an over-the-counter form of Colace or another stool softener and take twice a day   as long as you are requiring postoperative pain medications.  Take with a full glass of water daily.  If you experience loose stools or diarrhea, hold the colace until you stool forms back up.  If your symptoms do not get better within 1 week or if they get worse, check with your doctor.  Dulcolax (bisacodyl) - Pick up over-the-counter and take as directed by the product packaging as needed to assist with  the movement of your bowels.  Take with a full glass of water.  Use this product as needed if not relieved by Colace only.   MiraLax (polyethylene glycol) - Pick up over-the-counter to have on hand.  MiraLax is a solution that will increase the amount of water in your bowels to assist with bowel movements.  Take as directed and can mix with a glass of water, juice, soda, coffee, or tea.  Take if you go more than two days without a movement. Do not use MiraLax more than once per day. Call your doctor if you are still constipated or irregular after using this medication for 7 days in a row.  If you continue to have problems with postoperative constipation, please contact the office for further assistance and recommendations.  If you experience "the worst abdominal pain ever" or develop nausea or vomiting, please contact the office immediatly for further recommendations for treatment.  ITCHING  If you experience itching with your medications, try taking only a single pain pill, or even half a pain pill at a time.  You can also use Benadryl over the counter for itching or also to help with sleep.   TED HOSE STOCKINGS Wear the elastic stockings on both legs for three weeks following surgery during the day but you may remove then at night for sleeping.  MEDICATIONS See your medication summary on the After Visit Summary that the nursing staff will review with you prior to discharge.  You may have some home medications which will be placed on hold until you complete the course of blood thinner medication.  It is important for you to complete the blood thinner medication as prescribed by your surgeon.  Continue your approved medications as instructed at time of discharge.  Information on my medicine - XARELTO (Rivaroxaban)  Why was Xarelto prescribed for you? Xarelto was prescribed for you to reduce the risk of blood clots forming after orthopedic surgery. The medical term for these abnormal blood  clots is venous thromboembolism (VTE).  What do you need to know about xarelto ? Take your Xarelto ONCE DAILY at the same time every day. You may take it either with or without food.  If you have difficulty swallowing the tablet whole, you may crush it and mix in applesauce just prior to taking your dose.  Take Xarelto exactly as prescribed by your doctor and DO NOT stop taking Xarelto without talking to the doctor who prescribed the medication.  Stopping without other VTE prevention medication to take the place of Xarelto may increase your risk of developing a clot.  After discharge, you should have regular check-up appointments with your healthcare provider that is prescribing your Xarelto.    What do you do if you miss a dose? If you miss a dose, take it as soon as you remember on the same day then continue your regularly scheduled once daily regimen the next day. Do not take two doses of Xarelto on the same day.   Important Safety Information A possible side effect of Xarelto  is bleeding. You should call your healthcare provider right away if you experience any of the following: ? Bleeding from an injury or your nose that does not stop. ? Unusual colored urine (red or dark brown) or unusual colored stools (red or black). ? Unusual bruising for unknown reasons. ? A serious fall or if you hit your head (even if there is no bleeding).  Some medicines may interact with Xarelto and might increase your risk of bleeding while on Xarelto. To help avoid this, consult your healthcare provider or pharmacist prior to using any new prescription or non-prescription medications, including herbals, vitamins, non-steroidal anti-inflammatory drugs (NSAIDs) and supplements.  This website has more information on Xarelto: [email or web address]   PRECAUTIONS If you experience chest pain or shortness of breath - call 911 immediately for transfer to the hospital emergency department.  If you develop a  fever greater that 101 F, purulent drainage from wound, increased redness or drainage from wound, foul odor from the wound/dressing, or calf pain - CONTACT YOUR SURGEON.                                                   FOLLOW-UP APPOINTMENTS Make sure you keep all of your appointments after your operation with your surgeon and caregivers. You should call the office at the above phone number and make an appointment for approximately two weeks after the date of your surgery or on the date instructed by your surgeon outlined in the "After Visit Summary".  RANGE OF MOTION AND STRENGTHENING EXERCISES  These exercises are designed to help you keep full movement of your hip joint. Follow your caregiver's or physical therapist's instructions. Perform all exercises about fifteen times, three times per day or as directed. Exercise both hips, even if you have had only one joint replacement. These exercises can be done on a training (exercise) mat, on the floor, on a table or on a bed. Use whatever works the best and is most comfortable for you. Use music or television while you are exercising so that the exercises are a pleasant break in your day. This will make your life better with the exercises acting as a break in routine you can look forward to.   Lying on your back, slowly slide your foot toward your buttocks, raising your knee up off the floor. Then slowly slide your foot back down until your leg is straight again.   Lying on your back spread your legs as far apart as you can without causing discomfort.   Lying on your side, raise your upper leg and foot straight up from the floor as far as is comfortable. Slowly lower the leg and repeat.   Lying on your back, tighten up the muscle in the front of your thigh (quadriceps muscles). You can do this by keeping your leg straight and trying to raise your heel off the floor. This helps strengthen the largest muscle supporting your knee.   Lying on your back,  tighten up the muscles of your buttocks both with the legs straight and with the knee bent at a comfortable angle while keeping your heel on the floor.   IF YOU ARE TRANSFERRED TO A SKILLED REHAB FACILITY If the patient is transferred to a skilled rehab facility following release from the hospital, a list of the current medications will be sent  to the facility for the patient to continue.  When discharged from the skilled rehab facility, please have the facility set up the patient's [place] prior to being released. Also, the skilled facility will be responsible for providing the patient with their medications at time of release from the facility to include their pain medication, the muscle relaxants, and their blood thinner medication. If the patient is still at the rehab facility at time of the two week follow up appointment, the skilled rehab facility will also need to assist the patient in arranging follow up appointment in our office and any transportation needs.  MAKE SURE YOU:   Understand these instructions.   Get help right away if you are not doing well or get worse.    Pick up stool softner and laxative for home use following surgery while on pain medications. Do not submerge incision under water. Please use good hand washing techniques while changing dressing each day. May shower starting three days after surgery. Please use a clean towel to pat the incision dry following showers. Continue to use ice for pain and swelling after surgery. Do not use any lotions or creams on the incision until instructed by your surgeon.

## 2018-10-19 ENCOUNTER — Ambulatory Visit: Payer: Medicare PPO | Admitting: Internal Medicine

## 2018-10-19 ENCOUNTER — Encounter (HOSPITAL_COMMUNITY): Payer: Self-pay | Admitting: Orthopedic Surgery

## 2018-10-19 LAB — CBC
HCT: 39.1 % (ref 36.0–46.0)
Hemoglobin: 11.3 g/dL — ABNORMAL LOW (ref 12.0–15.0)
MCH: 23.3 pg — ABNORMAL LOW (ref 26.0–34.0)
MCHC: 28.9 g/dL — ABNORMAL LOW (ref 30.0–36.0)
MCV: 80.8 fL (ref 80.0–100.0)
Platelets: 345 10*3/uL (ref 150–400)
RBC: 4.84 MIL/uL (ref 3.87–5.11)
RDW: 14.4 % (ref 11.5–15.5)
WBC: 17.1 10*3/uL — ABNORMAL HIGH (ref 4.0–10.5)
nRBC: 0 % (ref 0.0–0.2)

## 2018-10-19 LAB — BASIC METABOLIC PANEL
Anion gap: 8 (ref 5–15)
BUN: 13 mg/dL (ref 8–23)
CO2: 21 mmol/L — ABNORMAL LOW (ref 22–32)
Calcium: 9 mg/dL (ref 8.9–10.3)
Chloride: 106 mmol/L (ref 98–111)
Creatinine, Ser: 0.86 mg/dL (ref 0.44–1.00)
GFR calc Af Amer: >60 mL/min (ref >60)
GFR calc non Af Amer: >60 mL/min (ref >60)
Glucose, Bld: 123 mg/dL — ABNORMAL HIGH (ref 70–99)
Potassium: 3.8 mmol/L (ref 3.5–5.1)
SODIUM: 135 mmol/L (ref 135–145)

## 2018-10-19 MED ORDER — TRAMADOL HCL 50 MG PO TABS: 50.0000 mg | ORAL_TABLET | Freq: Four times a day (QID) | ORAL | 0 refills | Status: DC | PRN

## 2018-10-19 MED ORDER — RIVAROXABAN 10 MG PO TABS: 10.0000 mg | ORAL_TABLET | Freq: Every day | ORAL | 0 refills | Status: DC

## 2018-10-19 MED ORDER — METHOCARBAMOL 500 MG PO TABS: 500.0000 mg | ORAL_TABLET | Freq: Four times a day (QID) | ORAL | 0 refills | Status: DC | PRN

## 2018-10-19 MED ORDER — HYDROCODONE-ACETAMINOPHEN 5-325 MG PO TABS: 1.0000 | ORAL_TABLET | Freq: Four times a day (QID) | ORAL | 0 refills | Status: DC | PRN

## 2018-10-19 NOTE — Progress Notes (Signed)
Physical Therapy Treatment Patient Details Name: Monica Horn MRN: [id number] DOB: 1946-04-23 Today's Date: 10/19/2018    History of Present Illness L DA-THA    PT Comments    Pt ambulated in hallway and practiced safe stair technique.  Spouse present and observed session.  Pt also performed exercises and provided with HEP.  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  Pt and spouse had no further questions and pt ready for d/c home today.    Follow Up Recommendations  Follow surgeon's recommendation for DC plan and follow-up therapies(plan for only HEP)     Equipment Recommendations  None recommended by PT    Recommendations for Other Services       Precautions / Restrictions Precautions Precautions: None Restrictions Other Position/Activity Restrictions: WBAT    Mobility  Bed Mobility Overal bed mobility: Needs Assistance Bed Mobility: Supine to Sit     Supine to sit: HOB elevated;Min assist     General bed mobility comments: min A to support LLE per pt request  Transfers Overall transfer level: Needs assistance Equipment used: Rolling walker (2 wheeled) Transfers: Sit to/from Stand Sit to Stand: Min guard         General transfer comment: verbal cues for UE and LE positioning  Ambulation/Gait Ambulation/Gait assistance: Min guard Gait Distance (Feet): 90 Feet Assistive device: Rolling walker (2 wheeled) Gait Pattern/deviations: Step-to pattern;Decreased stance time - left;Antalgic     General Gait Details: verbal cues for step length, heel strike and posture   Stairs Stairs: Yes Stairs assistance: Min assist;Min guard Stair Management: Step to pattern;One rail Right;Forwards Number of Stairs: 3 General stair comments: verbal cues for sequence and safety, spouse present and observed;  min assist for last step due to incorrect leading leg; pt performed again correctly and with  min/guard   Wheelchair Mobility    Modified Rankin (Stroke Patients Only)       Balance                                            Cognition Arousal/Alertness: Awake/alert Behavior During Therapy: WFL for tasks assessed/performed Overall Cognitive Status: Within Functional Limits for tasks assessed                                        Exercises Total Joint Exercises Ankle Circles/Pumps: AROM;Both;10 reps;Supine Quad Sets: AROM;Both;Supine;10 reps Short Arc Quad: AROM;10 reps;Left Heel Slides: AAROM;Left;10 reps;Supine Hip ABduction/ADduction: AAROM;Left;10 reps;Supine Long Arc Quad: AROM;Left;10 reps;Seated    General Comments        Pertinent Vitals/Pain Pain Assessment: 0-10 Pain Score: 3  Pain Location: L hip with walking Pain Descriptors / Indicators: Sore Pain Intervention(s): Limited activity within patient's tolerance;Monitored during session;Premedicated before session;Repositioned    Home Living                      Prior Function            PT Goals (current goals can now be found in the care plan section) Progress towards PT goals: Progressing toward goals    Frequency    7X/week      PT Plan Current plan remains appropriate    Co-evaluation  AM-PAC PT "6 Clicks" Mobility   Outcome Measure  Help needed turning from your back to your side while in a flat bed without using bedrails?: A Little Help needed moving from lying on your back to sitting on the side of a flat bed without using bedrails?: A Little Help needed moving to and from a bed to a chair (including a wheelchair)?: A Little Help needed standing up from a chair using your arms (e.g., wheelchair or bedside chair)?: A Little Help needed to walk in hospital room?: A Little Help needed climbing 3-5 steps with a railing? : A Little 6 Click Score: 18    End of Session Equipment Utilized During Treatment: Gait  belt Activity Tolerance: Patient tolerated treatment well Patient left: in chair;with call bell/phone within reach;with family/visitor present Nurse Communication: Mobility status PT Visit Diagnosis: Difficulty in walking, not elsewhere classified (R26.2);Muscle weakness (generalized) (M62.81)     Time: 4497-5300 PT Time Calculation (min) (ACUTE ONLY): 21 min  Charges:  $Gait Training: 8-22 mins                     Carmelia Bake, PT, DPT Acute Rehabilitation Services Office: [phone] Pager: [phone]  Trena Platt 10/19/2018, 3:49 PM

## 2018-10-19 NOTE — Progress Notes (Signed)
   Subjective: 1 Day Post-Op Procedure(s) (LRB): TOTAL HIP ARTHROPLASTY ANTERIOR APPROACH (Left) Patient reports pain as mild.   Patient seen in rounds with Dr. Wynelle Link. Patient is well, and has had no acute complaints or problems. States she is ready to go home. Endorses good appetite. Denies chest pain or SOB. Foley catheter removed this AM. No issues overnight.  We will continue therapy today.   Objective: Vital signs in last 24 hours: Temp:  [97.6 F (36.4 C)-98.6 F (37 C)] 98.4 F (36.9 C) (03/05 0527) Pulse Rate:  [76-105] 102 (03/05 0527) Resp:  [12-22] 17 (03/05 0527) BP: (80-142)/(48-102) 136/81 (03/05 0527) SpO2:  [93 %-100 %] 97 % (03/05 0527) Weight:  [77.6 kg] 77.6 kg (03/04 0756)  Intake/Output from previous day:  Intake/Output Summary (Last 24 hours) at 10/19/2018 0742 Last data filed at 10/19/2018 0600 Gross per 24 hour  Intake 4430.17 ml  Output 2210 ml  Net 2220.17 ml    Labs: Recent Labs    10/19/18 0506  HGB 11.3*   Recent Labs    10/19/18 0506  WBC 17.1*  RBC 4.84  HCT 39.1  PLT 345   Recent Labs    10/19/18 0506  NA 135  K 3.8  CL 106  CO2 21*  BUN 13  CREATININE 0.86  GLUCOSE 123*  CALCIUM 9.0   Exam: General - Patient is Alert and Oriented Extremity - Neurologically intact Neurovascular intact Sensation intact distally Dorsiflexion/Plantar flexion intact Dressing - dressing C/D/I Motor Function - intact, moving foot and toes well on exam.   Past Medical History:  Diagnosis Date  . Abnormal EKG 2014   NONSPECIFIC ST DEPRESSION + T ANORMALITY, WORKED UP 2014 DR GREG TAYLOR AND RELEASED BY DR Lovena Le  . Allergy   . Arthritis   . Blood transfusion without reported diagnosis   . Colon polyps   . Diabetes mellitus    PT. DENIES- no medicines for this   . Fatty infiltration of liver   . GERD (gastroesophageal reflux disease)   . Hydradenitis 01/2009   Right S aureus  . Hyperlipidemia   . Hypertension   . Internal hemorrhoids    . Sinus infection FEB 10-02-2018   FINISHING ANTIBIOTICS NOW  . Tubulovillous adenoma polyp of colon 03/2007   w/ focal high grade dysplasia, no carcinoma    Assessment/Plan: 1 Day Post-Op Procedure(s) (LRB): TOTAL HIP ARTHROPLASTY ANTERIOR APPROACH (Left) Principal Problem:   OA (osteoarthritis) of hip  Estimated body mass index is 32.31 kg/m as calculated from the following:   Height as of this encounter: 5\' 1"  (1.549 m).   Weight as of this encounter: 77.6 kg. Advance diet Up with therapy D/C IV fluids  DVT Prophylaxis - Xarelto Weight bearing as tolerated. D/C O2 and pulse ox and try on room air. Hemovac pulled without difficulty, will continue therapy.  Plan is to go Home after hospital stay. Plan for discharge today with HEP as long as progresses with therapy and meeting her goals. Follow-up in the office in 2 weeks.   Theresa Duty, PA-C Orthopedic Surgery 10/19/2018, 7:42 AM

## 2018-10-19 NOTE — Plan of Care (Signed)
  Problem: Clinical Measurements: Goal: Will remain free from infection Outcome: Progressing   Problem: Clinical Measurements: Goal: Diagnostic test results will improve Outcome: Progressing   Problem: Clinical Measurements: Goal: Respiratory complications will improve Outcome: Progressing   Problem: Clinical Measurements: Goal: Cardiovascular complication will be avoided Outcome: Progressing   Problem: Activity: Goal: Risk for activity intolerance will decrease Outcome: Progressing   Problem: Nutrition: Goal: Adequate nutrition will be maintained Outcome: Progressing   Problem: Pain Managment: Goal: General experience of comfort will improve Outcome: Progressing

## 2018-10-19 NOTE — Plan of Care (Signed)
Pt alert and oriented, doing well post op. Pain well controlled. Passed therapy goals, plan to d/c today per MD order. RN will monitor.

## 2018-10-23 NOTE — Discharge Summary (Signed)
Physician Discharge Summary   Patient ID: SOLACE MANWARREN MRN: [id number] DOB/AGE: Oct 08, 1945 73 y.o.  Admit date: 10/18/2018 Discharge date: 10/19/2018  Primary Diagnosis: Osteoarthritis, left hip   Admission Diagnoses:  Past Medical History:  Diagnosis Date  . Abnormal EKG 2014   NONSPECIFIC ST DEPRESSION + T ANORMALITY, WORKED UP 2014 DR GREG TAYLOR AND RELEASED BY DR Lovena Le  . Allergy   . Arthritis   . Blood transfusion without reported diagnosis   . Colon polyps   . Diabetes mellitus    PT. DENIES- no medicines for this   . Fatty infiltration of liver   . GERD (gastroesophageal reflux disease)   . Hydradenitis 01/2009   Right S aureus  . Hyperlipidemia   . Hypertension   . Internal hemorrhoids   . Sinus infection FEB 10-02-2018   FINISHING ANTIBIOTICS NOW  . Tubulovillous adenoma polyp of colon 03/2007   w/ focal high grade dysplasia, no carcinoma   Discharge Diagnoses:   Principal Problem:   OA (osteoarthritis) of hip  Estimated body mass index is 32.31 kg/m as calculated from the following:   Height as of this encounter: 5\' 1"  (1.549 m).   Weight as of this encounter: 77.6 kg.  Procedure:  Procedure(s) (LRB): TOTAL HIP ARTHROPLASTY ANTERIOR APPROACH (Left)   Consults: None  HPI: Monica Horn is a 72 y.o. female who has advanced end-stage arthritis of their Left  hip with progressively worsening pain and dysfunction.The patient has failed nonoperative management and presents for total hip arthroplasty.   Laboratory Data: Admission on 10/18/2018, Discharged on 10/19/2018  Component Date Value Ref Range Status  . Glucose-Capillary 10/18/2018 120* 70 - 99 mg/dL Final  . WBC 10/19/2018 17.1* 4.0 - 10.5 K/uL Final  . RBC 10/19/2018 4.84  3.87 - 5.11 MIL/uL Final  . Hemoglobin 10/19/2018 11.3* 12.0 - 15.0 g/dL Final  . HCT 10/19/2018 39.1  36.0 - 46.0 % Final  . MCV 10/19/2018 80.8  80.0 - 100.0 fL Final  . MCH 10/19/2018 23.3* 26.0 - 34.0 pg Final  . MCHC  10/19/2018 28.9* 30.0 - 36.0 g/dL Final  . RDW 10/19/2018 14.4  11.5 - 15.5 % Final  . Platelets 10/19/2018 345  150 - 400 K/uL Final  . nRBC 10/19/2018 0.0  0.0 - 0.2 % Final   Performed at [facility], [place] [address], [place], [place] [address]  . Sodium 10/19/2018 135  135 - 145 mmol/L Final  . Potassium 10/19/2018 3.8  3.5 - 5.1 mmol/L Final  . Chloride 10/19/2018 106  98 - 111 mmol/L Final  . CO2 10/19/2018 21* 22 - 32 mmol/L Final  . Glucose, Bld 10/19/2018 123* 70 - 99 mg/dL Final  . BUN 10/19/2018 13  8 - 23 mg/dL Final  . Creatinine, Ser 10/19/2018 0.86  0.44 - 1.00 mg/dL Final  . Calcium 10/19/2018 9.0  8.9 - 10.3 mg/dL Final  . GFR calc non Af Amer 10/19/2018 >60  >60 mL/min Final  . GFR calc Af Amer 10/19/2018 >60  >60 mL/min Final  . Anion gap 10/19/2018 8  5 - 15 Final   Performed at [facility], [place] [address], [place], [place] [address]  Hospital Outpatient Visit on 10/13/2018  Component Date Value Ref Range Status  . Glucose-Capillary 10/13/2018 110* 70 - 99 mg/dL Final  . Hgb A1c MFr Bld 10/13/2018 6.3* 4.8 - 5.6 % Final   Comment: (NOTE) Pre diabetes:  5.7%-6.4% Diabetes:              >6.4% Glycemic control for   <7.0% adults with diabetes   . Mean Plasma Glucose 10/13/2018 134.11  mg/dL Final   Performed at [place] [address], [place], [place] [address]  . aPTT 10/13/2018 31  24 - 36 seconds Final   Performed at [facility], [place] [address], [place], [place] [address]  . WBC 10/13/2018 7.4  4.0 - 10.5 K/uL Final  . RBC 10/13/2018 5.50* 3.87 - 5.11 MIL/uL Final  . Hemoglobin 10/13/2018 12.5  12.0 - 15.0 g/dL Final  . HCT 10/13/2018 44.4  36.0 - 46.0 % Final  . MCV 10/13/2018 80.7  80.0 - 100.0 fL Final  . MCH 10/13/2018 22.7* 26.0 - 34.0 pg Final  . MCHC 10/13/2018 28.2* 30.0 - 36.0 g/dL Final  . RDW 10/13/2018 14.3  11.5 - 15.5 % Final  . Platelets 10/13/2018 377  150 - 400  K/uL Final  . nRBC 10/13/2018 0.0  0.0 - 0.2 % Final   Performed at [facility], [place] [address] [place], [place] [address]  . Sodium 10/13/2018 138  135 - 145 mmol/L Final  . Potassium 10/13/2018 3.8  3.5 - 5.1 mmol/L Final  . Chloride 10/13/2018 107  98 - 111 mmol/L Final  . CO2 10/13/2018 24  22 - 32 mmol/L Final  . Glucose, Bld 10/13/2018 102* 70 - 99 mg/dL Final  . BUN 10/13/2018 13  8 - 23 mg/dL Final  . Creatinine, Ser 10/13/2018 0.92  0.44 - 1.00 mg/dL Final  . Calcium 10/13/2018 9.4  8.9 - 10.3 mg/dL Final  . Total Protein 10/13/2018 8.0  6.5 - 8.1 g/dL Final  . Albumin 10/13/2018 4.4  3.5 - 5.0 g/dL Final  . AST 10/13/2018 23  15 - 41 U/L Final  . ALT 10/13/2018 28  0 - 44 U/L Final  . Alkaline Phosphatase 10/13/2018 141* 38 - 126 U/L Final  . Total Bilirubin 10/13/2018 0.5  0.3 - 1.2 mg/dL Final  . GFR calc non Af Amer 10/13/2018 >60  >60 mL/min Final  . GFR calc Af Amer 10/13/2018 >60  >60 mL/min Final  . Anion gap 10/13/2018 7  5 - 15 Final   Performed at [facility], [place] [address] [place], [place] [address]  . Prothrombin Time 10/13/2018 12.4  11.4 - 15.2 seconds Final  . INR 10/13/2018 0.9  0.8 - 1.2 Final   Comment: (NOTE) INR goal varies based on device and disease states. Performed at [facility], [place] [address] [place], [place] [address]   . ABO/RH(D) 10/13/2018 A POS   Final  . Antibody Screen 10/13/2018 NEG   Final  . Sample Expiration 10/13/2018 10/21/2018   Final  . Extend sample reason 10/13/2018    Final                   Value:NO TRANSFUSIONS OR PREGNANCY IN THE PAST 3 MONTHS Performed at [facility], [place] [address], [place], [place] [address]   . MRSA, PCR 10/13/2018 NEGATIVE  NEGATIVE Final  . Staphylococcus aureus 10/13/2018 NEGATIVE  NEGATIVE Final   Comment: (NOTE) The Xpert SA Assay (FDA approved for NASAL specimens in patients 19 years of age and older), is one  component of a comprehensive surveillance program. It is not intended to diagnose infection nor to guide or monitor treatment. Performed at [facility], [place] [address], [place], [place] [address]      X-Rays:Dg Chest 2 View  Result Date: 10/17/2018 CLINICAL DATA:  Cough.  Chest congestion. EXAM: CHEST - 2 VIEW COMPARISON:  None. FINDINGS: The heart size and mediastinal contours are within normal limits. Both lungs are clear. The visualized skeletal structures are unremarkable. IMPRESSION: Normal exam. Electronically Signed   By: Lorriane Shire M.D.   On: 10/17/2018 08:26   Dg Pelvis Portable  Result Date: 10/18/2018 CLINICAL DATA:  Left total hip arthroplasty. EXAM: PORTABLE PELVIS 1-2 VIEWS COMPARISON:  Intraoperative x-rays from same day. FINDINGS: The left hip demonstrates a total arthroplasty without evidence of hardware failure or complication. There is expected intra-articular air. There is no fracture or dislocation. The alignment is anatomic. Post-surgical changes and surgical drain noted in the surrounding soft tissues. The right hip joint is unremarkable. IMPRESSION: Interval left total hip arthroplasty without evidence of acute postoperative complication. Electronically Signed   By: Titus Dubin M.D.   On: 10/18/2018 12:38   Dg C-arm 1-60 Min  Result Date: 10/18/2018 CLINICAL DATA:  Left total hip replacement. EXAM: OPERATIVE LEFT HIP (WITH PELVIS IF PERFORMED) TECHNIQUE: Fluoroscopic spot image(s) were submitted for interpretation post-operatively. FLUOROSCOPY TIME:  12 seconds. COMPARISON:  None. FINDINGS: Intraoperative fluoroscopic images demonstrate left total hip arthroplasty. Components are well aligned. No acute osseous abnormality. IMPRESSION: Intraoperative fluoroscopic guidance for left total hip arthroplasty. Electronically Signed   By: Titus Dubin M.D.   On: 10/18/2018 12:37   Dg Hip Operative Unilat W Or W/o Pelvis Left  Result Date:  10/18/2018 CLINICAL DATA:  Left total hip replacement. EXAM: OPERATIVE LEFT HIP (WITH PELVIS IF PERFORMED) TECHNIQUE: Fluoroscopic spot image(s) were submitted for interpretation post-operatively. FLUOROSCOPY TIME:  12 seconds. COMPARISON:  None. FINDINGS: Intraoperative fluoroscopic images demonstrate left total hip arthroplasty. Components are well aligned. No acute osseous abnormality. IMPRESSION: Intraoperative fluoroscopic guidance for left total hip arthroplasty. Electronically Signed   By: Titus Dubin M.D.   On: 10/18/2018 12:37    EKG: Orders placed or performed during the hospital encounter of 10/13/18  . EKG 12 lead  . EKG 12 lead     Hospital Course: Ramanda J Garms is a 73 y.o. who was admitted to [facility]. They were brought to the operating room on 10/18/2018 and underwent Procedure(s): [place].  Patient tolerated the procedure well and was later transferred to the recovery room and then to the orthopaedic floor for postoperative care. They were given PO and IV analgesics for pain control following their surgery. They were given 24 hours of postoperative antibiotics of  Anti-infectives (From admission, onward)   Start     Dose/Rate Route Frequency Ordered Stop   10/19/18 1000  cefdinir (OMNICEF) capsule 300 mg  Status:  Discontinued     300 mg Oral 2 times daily 10/18/18 1327 10/19/18 1551   10/18/18 1600  ceFAZolin (ANCEF) IVPB 2g/100 mL premix     2 g 200 mL/hr over 30 Minutes Intravenous Every 6 hours 10/18/18 1327 10/18/18 2206   10/18/18 0800  ceFAZolin (ANCEF) IVPB 2g/100 mL premix     2 g 200 mL/hr over 30 Minutes Intravenous On call to O.R. 10/18/18 0750 10/18/18 1013     and started on DVT prophylaxis in the form of Xarelto.   PT and OT were ordered for total joint protocol. Discharge planning consulted to help with postop disposition and equipment needs.  Patient had a good night  on the evening of surgery. They started to get  up OOB with therapy on POD #0. Pt was seen during rounds and was ready to go home pending progress with therapy. Hemovac drain was pulled without difficulty. She worked with therapy on POD #1 and was meeting her goals. Pt was discharged to home later that day in stable condition.  Diet: Diabetic diet Activity: WBAT Follow-up: in 2 weeks Disposition: Home with HEP Discharged Condition: stable   Discharge Instructions    Call MD / Call 911   Complete by:  As directed    If you experience chest pain or shortness of breath, CALL 911 and be transported to the hospital emergency room.  If you develope a fever above 101 F, pus (white drainage) or increased drainage or redness at the wound, or calf pain, call your surgeon's office.   Change dressing   Complete by:  As directed    You may change your dressing on Friday, then change the dressing daily with sterile 4 x 4 inch gauze dressing and paper tape.   Constipation Prevention   Complete by:  As directed    Drink plenty of fluids.  Prune juice may be helpful.  You may use a stool softener, such as Colace (over the counter) 100 mg twice a day.  Use MiraLax (over the counter) for constipation as needed.   Diet - low sodium heart healthy   Complete by:  As directed    Discharge instructions   Complete by:  As directed    Dr. Gaynelle Arabian Total Joint Specialist [facility] [address], [place], [place] [address] [phone]  ANTERIOR APPROACH TOTAL HIP REPLACEMENT POSTOPERATIVE DIRECTIONS   Hip Rehabilitation, Guidelines Following Surgery  The results of a hip operation are greatly improved after range of motion and muscle strengthening exercises. Follow all safety measures which are given to protect your hip. If any of these exercises cause increased pain or swelling in your joint, decrease the amount until you are comfortable again. Then slowly increase the exercises. Call your caregiver if you have problems or questions.    HOME CARE INSTRUCTIONS  Remove items at home which could result in a fall. This includes throw rugs or furniture in walking pathways.  ICE to the affected hip every three hours for 30 minutes at a time and then as needed for pain and swelling.  Continue to use ice on the hip for pain and swelling from surgery. You may notice swelling that will progress down to the foot and ankle.  This is normal after surgery.  Elevate the leg when you are not up walking on it.   Continue to use the breathing machine which will help keep your temperature down.  It is common for your temperature to cycle up and down following surgery, especially at night when you are not up moving around and exerting yourself.  The breathing machine keeps your lungs expanded and your temperature down.  DIET You may resume your previous home diet once your are discharged from the hospital.  DRESSING / WOUND CARE / SHOWERING You may change your dressing 3-5 days after surgery.  Then change the dressing every day with sterile gauze.  Please use good hand washing techniques before changing the dressing.  Do not use any lotions or creams on the incision until instructed by your surgeon. You may start showering once you are discharged home but do not submerge the incision under water. Just pat the incision  dry and apply a dry gauze dressing on daily. Change the surgical dressing daily and reapply a dry dressing each time.  ACTIVITY Walk with your walker as instructed. Use walker as long as suggested by your caregivers. Avoid periods of inactivity such as sitting longer than an hour when not asleep. This helps prevent blood clots.  You may resume a sexual relationship in one month or when given the OK by your doctor.  You may return to work once you are cleared by your doctor.  Do not drive a car for 6 weeks or until released by you surgeon.  Do not drive while taking narcotics.  WEIGHT BEARING Weight bearing as tolerated with  assist device (walker, cane, etc) as directed, use it as long as suggested by your surgeon or therapist, typically at least 4-6 weeks.  POSTOPERATIVE CONSTIPATION PROTOCOL Constipation - defined medically as fewer than three stools per week and severe constipation as less than one stool per week.  One of the most common issues patients have following surgery is constipation.  Even if you have a regular bowel pattern at home, your normal regimen is likely to be disrupted due to multiple reasons following surgery.  Combination of anesthesia, postoperative narcotics, change in appetite and fluid intake all can affect your bowels.  In order to avoid complications following surgery, here are some recommendations in order to help you during your recovery period.  Colace (docusate) - Pick up an over-the-counter form of Colace or another stool softener and take twice a day as long as you are requiring postoperative pain medications.  Take with a full glass of water daily.  If you experience loose stools or diarrhea, hold the colace until you stool forms back up.  If your symptoms do not get better within 1 week or if they get worse, check with your doctor.  Dulcolax (bisacodyl) - Pick up over-the-counter and take as directed by the product packaging as needed to assist with the movement of your bowels.  Take with a full glass of water.  Use this product as needed if not relieved by Colace only.   MiraLax (polyethylene glycol) - Pick up over-the-counter to have on hand.  MiraLax is a solution that will increase the amount of water in your bowels to assist with bowel movements.  Take as directed and can mix with a glass of water, juice, soda, coffee, or tea.  Take if you go more than two days without a movement. Do not use MiraLax more than once per day. Call your doctor if you are still constipated or irregular after using this medication for 7 days in a row.  If you continue to have problems with postoperative  constipation, please contact the office for further assistance and recommendations.  If you experience "the worst abdominal pain ever" or develop nausea or vomiting, please contact the office immediatly for further recommendations for treatment.  ITCHING  If you experience itching with your medications, try taking only a single pain pill, or even half a pain pill at a time.  You can also use Benadryl over the counter for itching or also to help with sleep.   TED HOSE STOCKINGS Wear the elastic stockings on both legs for three weeks following surgery during the day but you may remove then at night for sleeping.  MEDICATIONS See your medication summary on the "After Visit Summary" that the nursing staff will review with you prior to discharge.  You may have some home medications which  will be placed on hold until you complete the course of blood thinner medication.  It is important for you to complete the blood thinner medication as prescribed by your surgeon.  Continue your approved medications as instructed at time of discharge.  PRECAUTIONS If you experience chest pain or shortness of breath - call 911 immediately for transfer to the hospital emergency department.  If you develop a fever greater that 101 F, purulent drainage from wound, increased redness or drainage from wound, foul odor from the wound/dressing, or calf pain - CONTACT YOUR SURGEON.                                                   FOLLOW-UP APPOINTMENTS Make sure you keep all of your appointments after your operation with your surgeon and caregivers. You should call the office at the above phone number and make an appointment for approximately two weeks after the date of your surgery or on the date instructed by your surgeon outlined in the "After Visit Summary".  RANGE OF MOTION AND STRENGTHENING EXERCISES  These exercises are designed to help you keep full movement of your hip joint. Follow your caregiver's or physical  therapist's instructions. Perform all exercises about fifteen times, three times per day or as directed. Exercise both hips, even if you have had only one joint replacement. These exercises can be done on a training (exercise) mat, on the floor, on a table or on a bed. Use whatever works the best and is most comfortable for you. Use music or television while you are exercising so that the exercises are a pleasant break in your day. This will make your life better with the exercises acting as a break in routine you can look forward to.  Lying on your back, slowly slide your foot toward your buttocks, raising your knee up off the floor. Then slowly slide your foot back down until your leg is straight again.  Lying on your back spread your legs as far apart as you can without causing discomfort.  Lying on your side, raise your upper leg and foot straight up from the floor as far as is comfortable. Slowly lower the leg and repeat.  Lying on your back, tighten up the muscle in the front of your thigh (quadriceps muscles). You can do this by keeping your leg straight and trying to raise your heel off the floor. This helps strengthen the largest muscle supporting your knee.  Lying on your back, tighten up the muscles of your buttocks both with the legs straight and with the knee bent at a comfortable angle while keeping your heel on the floor.   IF YOU ARE TRANSFERRED TO A SKILLED REHAB FACILITY If the patient is transferred to a skilled rehab facility following release from the hospital, a list of the current medications will be sent to the facility for the patient to continue.  When discharged from the skilled rehab facility, please have the facility set up the patient's [place] prior to being released. Also, the skilled facility will be responsible for providing the patient with their medications at time of release from the facility to include their pain medication, the muscle relaxants, and  their blood thinner medication. If the patient is still at the rehab facility at time of the two week follow up appointment,  the skilled rehab facility will also need to assist the patient in arranging follow up appointment in our office and any transportation needs.  MAKE SURE YOU:  Understand these instructions.  Get help right away if you are not doing well or get worse.    Pick up stool softner and laxative for home use following surgery while on pain medications. Do not submerge incision under water. Please use good hand washing techniques while changing dressing each day. May shower starting three days after surgery. Please use a clean towel to pat the incision dry following showers. Continue to use ice for pain and swelling after surgery. Do not use any lotions or creams on the incision until instructed by your surgeon.   Do not sit on low chairs, stoools or toilet seats, as it may be difficult to get up from low surfaces   Complete by:  As directed    Driving restrictions   Complete by:  As directed    No driving for two weeks   TED hose   Complete by:  As directed    Use stockings (TED hose) for three weeks on both leg(s).  You may remove them at night for sleeping.   Weight bearing as tolerated   Complete by:  As directed      Allergies as of 10/19/2018      Reactions   Benazepril Cough   Losartan Other (See Comments)   Headache   Lovastatin    myalgia      Medication List    STOP taking these medications   aspirin 81 MG tablet   cholecalciferol 25 MCG (1000 UT) tablet Commonly known as:  VITAMIN D   WOMENS MULTIVITAMIN PLUS PO     TAKE these medications   Align 4 MG Caps Take 1 capsule (4 mg total) by mouth daily.   amLODipine 5 MG tablet Commonly known as:  NORVASC Take 0.5 tablets (2.5 mg total) by mouth daily.   cefdinir 300 MG capsule Commonly known as:  OMNICEF Take 1 capsule (300 mg total) by mouth 2 (two) times daily.   cetirizine 10 MG  tablet Commonly known as:  ZYRTEC Take 1 tablet (10 mg total) by mouth daily as needed. What changed:  reasons to take this   HYDROcodone-acetaminophen 5-325 MG tablet Commonly known as:  NORCO/VICODIN Take 1-2 tablets by mouth every 6 (six) hours as needed for severe pain.   methocarbamol 500 MG tablet Commonly known as:  ROBAXIN Take 1 tablet (500 mg total) by mouth every 6 (six) hours as needed for muscle spasms.   pantoprazole 40 MG tablet Commonly known as:  PROTONIX TAKE 1 TABLET EVERY DAY What changed:    how much to take  how to take this  when to take this   rivaroxaban 10 MG Tabs tablet Commonly known as:  XARELTO Take 1 tablet (10 mg total) by mouth daily with breakfast for 20 days. Then resume one 81 mg aspirin once a day.   traMADol 50 MG tablet Commonly known as:  ULTRAM Take 1-2 tablets (50-100 mg total) by mouth every 6 (six) hours as needed for moderate pain.   trolamine salicylate 10 % cream Commonly known as:  ASPERCREME Apply 1 application topically 2 (two) times daily as needed for muscle pain.            Discharge Care Instructions  (From admission, onward)         Start     Ordered  10/19/18 0000  Weight bearing as tolerated     10/19/18 0745   10/19/18 0000  Change dressing    Comments:  You may change your dressing on Friday, then change the dressing daily with sterile 4 x 4 inch gauze dressing and paper tape.   10/19/18 0745         Follow-up Information    Gaynelle Arabian, MD. Schedule an appointment as soon as possible for a visit on 11/02/2018.   Specialty:  Orthopedic Surgery Contact information: [address] [place] [address] [phone]           Signed: Theresa Duty, PA-C Orthopedic Surgery 10/23/2018, 7:56 AM

## 2018-11-17 DIAGNOSIS — Z96642 Presence of left artificial hip joint: Secondary | ICD-10-CM | POA: Insufficient documentation

## 2018-11-17 HISTORY — DX: Presence of left artificial hip joint: Z96.642

## 2018-11-21 DIAGNOSIS — Z96642 Presence of left artificial hip joint: Secondary | ICD-10-CM | POA: Diagnosis not present

## 2018-11-21 DIAGNOSIS — Z471 Aftercare following joint replacement surgery: Secondary | ICD-10-CM | POA: Diagnosis not present

## 2019-01-09 DIAGNOSIS — H04123 Dry eye syndrome of bilateral lacrimal glands: Secondary | ICD-10-CM | POA: Diagnosis not present

## 2019-01-09 DIAGNOSIS — H10413 Chronic giant papillary conjunctivitis, bilateral: Secondary | ICD-10-CM | POA: Diagnosis not present

## 2019-01-09 DIAGNOSIS — H40023 Open angle with borderline findings, high risk, bilateral: Secondary | ICD-10-CM | POA: Diagnosis not present

## 2019-03-07 ENCOUNTER — Ambulatory Visit (INDEPENDENT_AMBULATORY_CARE_PROVIDER_SITE_OTHER): Payer: Medicare Other | Admitting: Internal Medicine

## 2019-03-07 ENCOUNTER — Encounter: Payer: Self-pay | Admitting: Internal Medicine

## 2019-03-07 ENCOUNTER — Other Ambulatory Visit: Payer: Self-pay

## 2019-03-07 ENCOUNTER — Other Ambulatory Visit (INDEPENDENT_AMBULATORY_CARE_PROVIDER_SITE_OTHER): Payer: Medicare Other

## 2019-03-07 DIAGNOSIS — E119 Type 2 diabetes mellitus without complications: Secondary | ICD-10-CM

## 2019-03-07 DIAGNOSIS — I10 Essential (primary) hypertension: Secondary | ICD-10-CM

## 2019-03-07 DIAGNOSIS — R600 Localized edema: Secondary | ICD-10-CM | POA: Diagnosis not present

## 2019-03-07 DIAGNOSIS — M2550 Pain in unspecified joint: Secondary | ICD-10-CM | POA: Diagnosis not present

## 2019-03-07 DIAGNOSIS — K921 Melena: Secondary | ICD-10-CM

## 2019-03-07 DIAGNOSIS — E785 Hyperlipidemia, unspecified: Secondary | ICD-10-CM | POA: Diagnosis not present

## 2019-03-07 LAB — BASIC METABOLIC PANEL
BUN: 15 mg/dL (ref 6–23)
CO2: 26 mEq/L (ref 19–32)
Calcium: 9.9 mg/dL (ref 8.4–10.5)
Chloride: 107 mEq/L (ref 96–112)
Creatinine, Ser: 0.98 mg/dL (ref 0.40–1.20)
GFR: 67.45 mL/min (ref >60.00)
Glucose, Bld: 81 mg/dL (ref 70–99)
Potassium: 3.9 mEq/L (ref 3.5–5.1)
Sodium: 141 mEq/L (ref 135–145)

## 2019-03-07 LAB — CBC WITH DIFFERENTIAL/PLATELET
Basophils Absolute: 0 10*3/uL (ref 0.0–0.1)
Basophils Relative: 0.3 % (ref 0.0–3.0)
Eosinophils Absolute: 0.2 10*3/uL (ref 0.0–0.7)
Eosinophils Relative: 2.6 % (ref 0.0–5.0)
HCT: 43.3 % (ref 36.0–46.0)
Hemoglobin: 13.2 g/dL (ref 12.0–15.0)
Lymphocytes Relative: 31 % (ref 12.0–46.0)
Lymphs Abs: 2.5 10*3/uL (ref 0.7–4.0)
MCHC: 30.5 g/dL (ref 30.0–36.0)
MCV: 75.2 fl — ABNORMAL LOW (ref 78.0–100.0)
Monocytes Absolute: 1.2 10*3/uL — ABNORMAL HIGH (ref 0.1–1.0)
Monocytes Relative: 15.7 % — ABNORMAL HIGH (ref 3.0–12.0)
Neutro Abs: 4 10*3/uL (ref 1.4–7.7)
Neutrophils Relative %: 50.4 % (ref 43.0–77.0)
Platelets: 319 10*3/uL (ref 150.0–400.0)
RBC: 5.76 Mil/uL — ABNORMAL HIGH (ref 3.87–5.11)
RDW: 15.1 % (ref 11.5–15.5)
WBC: 8 10*3/uL (ref 4.0–10.5)

## 2019-03-07 LAB — HEMOGLOBIN A1C: Hgb A1c MFr Bld: 6.5 % (ref 4.6–6.5)

## 2019-03-07 NOTE — Progress Notes (Signed)
Subjective:  Patient ID: Monica Horn, female    DOB: [birth year]  Age: 73 y.o. MRN: [id number]  CC: No chief complaint on file.   HPI Monica Horn presents for HTN, s/p THR - less pain, GERD  Outpatient Medications Prior to Visit  Medication Sig Dispense Refill  . amLODipine (NORVASC) 5 MG tablet Take 0.5 tablets (2.5 mg total) by mouth daily. 45 tablet 3  . cetirizine (ZYRTEC) 10 MG tablet Take 1 tablet (10 mg total) by mouth daily as needed. (Patient taking differently: Take 10 mg by mouth daily as needed for allergies. ) 90 tablet 3  . pantoprazole (PROTONIX) 40 MG tablet TAKE 1 TABLET EVERY DAY (Patient taking differently: Take 40 mg by mouth daily. TAKE 1 TABLET EVERY DAY) 90 tablet 1  . Probiotic Product (ALIGN) 4 MG CAPS Take 1 capsule (4 mg total) by mouth daily. 30 capsule 0  . trolamine salicylate (ASPERCREME) 10 % cream Apply 1 application topically 2 (two) times daily as needed for muscle pain.    Marland Kitchen HYDROcodone-acetaminophen (NORCO/VICODIN) 5-325 MG tablet Take 1-2 tablets by mouth every 6 (six) hours as needed for severe pain. (Patient not taking: Reported on 03/07/2019) 56 tablet 0  . methocarbamol (ROBAXIN) 500 MG tablet Take 1 tablet (500 mg total) by mouth every 6 (six) hours as needed for muscle spasms. (Patient not taking: Reported on 03/07/2019) 40 tablet 0  . rivaroxaban (XARELTO) 10 MG TABS tablet Take 1 tablet (10 mg total) by mouth daily with breakfast for 20 days. Then resume one 81 mg aspirin once a day. 20 tablet 0  . traMADol (ULTRAM) 50 MG tablet Take 1-2 tablets (50-100 mg total) by mouth every 6 (six) hours as needed for moderate pain. (Patient not taking: Reported on 03/07/2019) 40 tablet 0  . cefdinir (OMNICEF) 300 MG capsule Take 1 capsule (300 mg total) by mouth 2 (two) times daily. (Patient not taking: Reported on 03/07/2019) 14 capsule 0   No facility-administered medications prior to visit.     ROS: Review of Systems  Constitutional: Negative for  activity change, appetite change, chills, fatigue and unexpected weight change.  HENT: Negative for congestion, mouth sores and sinus pressure.   Eyes: Negative for visual disturbance.  Respiratory: Negative for cough and chest tightness.   Gastrointestinal: Negative for abdominal pain and nausea.  Genitourinary: Negative for difficulty urinating, frequency and vaginal pain.  Musculoskeletal: Positive for arthralgias. Negative for back pain and gait problem.  Skin: Negative for pallor and rash.  Neurological: Negative for dizziness, tremors, weakness, numbness and headaches.  Psychiatric/Behavioral: Negative for confusion and sleep disturbance.    Objective:  BP 118/78 (BP Location: Left Arm, Patient Position: Sitting, Cuff Size: Normal)   Pulse (!) 110   Temp 98.1 F (36.7 C) (Oral)   Ht 5\' 1"  (1.549 m)   Wt 172 lb (78 kg)   SpO2 96%   BMI 32.50 kg/m   BP Readings from Last 3 Encounters:  03/07/19 118/78  10/19/18 (!) 163/94  10/13/18 (!) 138/36    Wt Readings from Last 3 Encounters:  03/07/19 172 lb (78 kg)  10/18/18 171 lb (77.6 kg)  10/13/18 171 lb (77.6 kg)    Physical Exam Constitutional:      General: She is not in acute distress.    Appearance: She is well-developed.  HENT:     Head: Normocephalic.     Right Ear: External ear normal.     Left Ear: External ear normal.  Nose: Nose normal.  Eyes:     General:        Right eye: No discharge.        Left eye: No discharge.     Conjunctiva/sclera: Conjunctivae normal.     Pupils: Pupils are equal, round, and reactive to light.  Neck:     Musculoskeletal: Normal range of motion and neck supple.     Thyroid: No thyromegaly.     Vascular: No JVD.     Trachea: No tracheal deviation.  Cardiovascular:     Rate and Rhythm: Normal rate and regular rhythm.     Heart sounds: Normal heart sounds.  Pulmonary:     Effort: No respiratory distress.     Breath sounds: No stridor. No wheezing.  Abdominal:      General: Bowel sounds are normal. There is no distension.     Palpations: Abdomen is soft. There is no mass.     Tenderness: There is no abdominal tenderness. There is no guarding or rebound.  Musculoskeletal:        General: No tenderness.  Lymphadenopathy:     Cervical: No cervical adenopathy.  Skin:    Findings: No erythema or rash.  Neurological:     Cranial Nerves: No cranial nerve deficit.     Motor: No abnormal muscle tone.     Coordination: Coordination normal.     Deep Tendon Reflexes: Reflexes normal.  Psychiatric:        Behavior: Behavior normal.        Thought Content: Thought content normal.        Judgment: Judgment normal.     Lab Results  Component Value Date   WBC 17.1 (H) 10/19/2018   HGB 11.3 (L) 10/19/2018   HCT 39.1 10/19/2018   PLT 345 10/19/2018   GLUCOSE 123 (H) 10/19/2018   CHOL 192 06/24/2017   TRIG 126.0 06/24/2017   HDL 37.00 (L) 06/24/2017   LDLDIRECT 120.0 05/13/2015   LDLCALC 130 (H) 06/24/2017   ALT 28 10/13/2018   AST 23 10/13/2018   NA 135 10/19/2018   K 3.8 10/19/2018   CL 106 10/19/2018   CREATININE 0.86 10/19/2018   BUN 13 10/19/2018   CO2 21 (L) 10/19/2018   TSH 2.06 06/24/2017   INR 0.9 10/13/2018   HGBA1C 6.3 (H) 10/13/2018   MICROALBUR 1.5 02/06/2016    Dg Chest 2 View  Result Date: 10/17/2018 CLINICAL DATA:  Cough.  Chest congestion. EXAM: CHEST - 2 VIEW COMPARISON:  None. FINDINGS: The heart size and mediastinal contours are within normal limits. Both lungs are clear. The visualized skeletal structures are unremarkable. IMPRESSION: Normal exam. Electronically Signed   By: Lorriane Shire M.D.   On: 10/17/2018 08:26    Assessment & Plan:   There are no diagnoses linked to this encounter.   No orders of the defined types were placed in this encounter.    Follow-up: No follow-ups on file.  Walker Kehr, MD

## 2019-03-07 NOTE — Assessment & Plan Note (Signed)
  On diet  

## 2019-03-07 NOTE — Assessment & Plan Note (Signed)
L foot is better w/less amlodipne Compression sock

## 2019-03-07 NOTE — Patient Instructions (Signed)
These suggestions will probably help you to improve your metabolism if you are not overweight and to lose weight if you are overweight: 1.  Reduce your consumption of sugars and starches.  Eliminate high fructose corn syrup from your diet.  Reduce your consumption of processed foods.  For desserts try to have seasonal fruits, berries with with green, nuts, cheeses or dark chocolate with more than 70% cacao. 2.  Do not snack 3.  You do not have to eat breakfast.  If you choose to have breakfast-eat plain greek yogurt, eggs, oatmeal (without sugar) 4.  Drink water, freshly brewed unsweetened tea (green, black or herbal) or coffee.  Do not drink sodas including diet sodas , juices, beverages sweetened with artificial sweeteners. 5.  Reduce your consumption of refined grains. 6.  Avoid protein drinks such as Optifast, Slim fast etc. Eat chicken, fish, meat, dairy and beans for your sources of protein 7.  Natural unprocessed fats like cold pressed virgin olive oil, butter, coconut oil are good for you.  Eat avocados 8.  Increase your consumption of fiber.  Fruits, berries, vegetables, whole grains, flaxseeds, Chia seeds, beans, popcorn, nuts, oatmeal are good sources of fiber 9.  Use vinegar in your diet, i.e. apple cider vinegar, red wine or balsamic vinegar 10.  You can try fasting.  For example you can skip breakfast and lunch every other day (24-hour fast) 11.  Stress reduction, good night sleep, relaxation, meditation, yoga and other physical activity is likely to help you to maintain low weight too. 12.  If you drink alcohol, limit your alcohol intake to no more than 2 drinks a day.   Mediterranean diet is good for you. ([employer] has a typical Mediterranean cuisine menu) The Mediterranean diet is a way of eating based on the traditional cuisine of countries bordering the [employer]. While there is no single definition of the Mediterranean diet, it is typically high in vegetables,  fruits, whole grains, beans, nut and seeds, and olive oil. The main components of Mediterranean diet include: Marland Kitchen Daily consumption of vegetables, fruits, whole grains and healthy fats  . Weekly intake of fish, poultry, beans and eggs  . Moderate portions of dairy products  . Limited intake of red meat Other important elements of the Mediterranean diet are sharing meals with family and friends, enjoying a glass of red wine and being physically active. Health benefits of a Mediterranean diet: A traditional Mediterranean diet consisting of large quantities of fresh fruits and vegetables, nuts, fish and olive oil-coupled with physical activity-can reduce your risk of serious mental and physical health problems by: Preventing heart disease and strokes. Following a Mediterranean diet limits your intake of refined breads, processed foods, and red meat, and encourages drinking red wine instead of hard liquor-all factors that can help prevent heart disease and stroke. Keeping you agile. If you're an older adult, the nutrients gained with a Mediterranean diet may reduce your risk of developing muscle weakness and other signs of frailty by about 70 percent. Reducing the risk of Alzheimer's. Research suggests that the [place] diet may improve cholesterol, blood sugar levels, and overall blood vessel health, which in turn may reduce your risk of Alzheimer's disease or dementia. Halving the risk of Parkinson's disease. The high levels of antioxidants in the Mediterranean diet can prevent cells from undergoing a damaging process called oxidative stress, thereby cutting the risk of Parkinson's disease in half. Increasing longevity. By reducing your risk of developing heart disease or cancer with  the Mediterranean diet, you're reducing your risk of death at any age by 20%. Protecting against type 2 diabetes. A Mediterranean diet is rich in fiber which digests slowly, prevents huge swings in blood sugar, and can  help you maintain a healthy weight.    Cabbage soup recipe that will not make you gain weight: Take 1 small head of cabbage, 1 average pack of celery, 4 green peppers, 4 onions, 2 cans diced tomatoes (they are not available without salt), salt and spices to taste.  Chop cabbage, celery, peppers and onions.  And tomatoes and 2-2.5 liters (2.5 quarts) of water so that it would just cover the vegetables.  Bring to boil.  Add spices and salt.  Turn heat to low/medium and simmer for 20-25 minutes.  Naturally, you can make a smaller batch and change some of the ingredients.

## 2019-03-07 NOTE — Assessment & Plan Note (Signed)
Norvasc

## 2019-03-07 NOTE — Assessment & Plan Note (Signed)
Better after THR

## 2019-03-08 LAB — IRON,TIBC AND FERRITIN PANEL
%SAT: 30 % (calc) (ref 16–45)
Ferritin: 80 ng/mL (ref 16–288)
Iron: 113 ug/dL (ref 45–160)
TIBC: 377 mcg/dL (calc) (ref 250–450)

## 2019-03-22 ENCOUNTER — Other Ambulatory Visit: Payer: Self-pay | Admitting: Internal Medicine

## 2019-04-17 ENCOUNTER — Ambulatory Visit: Payer: Self-pay | Admitting: *Deleted

## 2019-04-17 ENCOUNTER — Other Ambulatory Visit: Payer: Self-pay

## 2019-04-17 ENCOUNTER — Ambulatory Visit (INDEPENDENT_AMBULATORY_CARE_PROVIDER_SITE_OTHER)
Admission: RE | Admit: 2019-04-17 | Discharge: 2019-04-17 | Disposition: A | Discharge status: Home / Self Care | Payer: Medicare Other | Source: Ambulatory Visit | Attending: Internal Medicine | Admitting: Internal Medicine

## 2019-04-17 ENCOUNTER — Encounter: Payer: Self-pay | Admitting: Internal Medicine

## 2019-04-17 ENCOUNTER — Ambulatory Visit (INDEPENDENT_AMBULATORY_CARE_PROVIDER_SITE_OTHER): Payer: Medicare Other | Admitting: Internal Medicine

## 2019-04-17 VITALS — BP 130/90 | HR 95 | Temp 98.1°F | Ht 61.0 in | Wt 171.0 lb

## 2019-04-17 DIAGNOSIS — M25473 Effusion, unspecified ankle: Secondary | ICD-10-CM

## 2019-04-17 DIAGNOSIS — M7989 Other specified soft tissue disorders: Secondary | ICD-10-CM | POA: Diagnosis not present

## 2019-04-17 DIAGNOSIS — M6740 Ganglion, unspecified site: Secondary | ICD-10-CM

## 2019-04-17 DIAGNOSIS — I10 Essential (primary) hypertension: Secondary | ICD-10-CM

## 2019-04-17 DIAGNOSIS — R600 Localized edema: Secondary | ICD-10-CM

## 2019-04-17 DIAGNOSIS — Z23 Encounter for immunization: Secondary | ICD-10-CM | POA: Diagnosis not present

## 2019-04-17 MED ORDER — TRIAMTERENE-HCTZ 37.5-25 MG PO TABS: 1.0000 | ORAL_TABLET | Freq: Every day | ORAL | 3 refills | Status: DC

## 2019-04-17 NOTE — Patient Instructions (Signed)
Stop Amlodipine Start Triamt/HCT

## 2019-04-17 NOTE — Assessment & Plan Note (Signed)
Norvasc - d/c (swelling)  Start Maxzide

## 2019-04-17 NOTE — Telephone Encounter (Signed)
Scheduled

## 2019-04-17 NOTE — Progress Notes (Signed)
Subjective:  Patient ID: Monica Horn, female    DOB: [birth year]  Age: 73 y.o. MRN: [id number]  CC: Leg Swelling (Left x 5 weeks)   HPI Monica Horn presents for B feet and LE swelling C/o L foot cyst - worse  Outpatient Medications Prior to Visit  Medication Sig Dispense Refill  . amLODipine (NORVASC) 5 MG tablet Take 0.5 tablets (2.5 mg total) by mouth daily. 45 tablet 3  . cetirizine (ZYRTEC) 10 MG tablet Take 1 tablet (10 mg total) by mouth daily as needed. (Patient taking differently: Take 10 mg by mouth daily as needed for allergies. ) 90 tablet 3  . HYDROcodone-acetaminophen (NORCO/VICODIN) 5-325 MG tablet Take 1-2 tablets by mouth every 6 (six) hours as needed for severe pain. 56 tablet 0  . methocarbamol (ROBAXIN) 500 MG tablet Take 1 tablet (500 mg total) by mouth every 6 (six) hours as needed for muscle spasms. 40 tablet 0  . pantoprazole (PROTONIX) 40 MG tablet TAKE 1 TABLET BY MOUTH  EVERY DAY 90 tablet 1  . Probiotic Product (ALIGN) 4 MG CAPS Take 1 capsule (4 mg total) by mouth daily. 30 capsule 0  . traMADol (ULTRAM) 50 MG tablet Take 1-2 tablets (50-100 mg total) by mouth every 6 (six) hours as needed for moderate pain. 40 tablet 0  . trolamine salicylate (ASPERCREME) 10 % cream Apply 1 application topically 2 (two) times daily as needed for muscle pain.    . rivaroxaban (XARELTO) 10 MG TABS tablet Take 1 tablet (10 mg total) by mouth daily with breakfast for 20 days. Then resume one 81 mg aspirin once a day. 20 tablet 0   No facility-administered medications prior to visit.     ROS: Review of Systems  Constitutional: Negative for activity change, appetite change, chills, fatigue and unexpected weight change.  HENT: Negative for congestion, mouth sores and sinus pressure.   Eyes: Negative for visual disturbance.  Respiratory: Negative for cough and chest tightness.   Cardiovascular: Positive for leg swelling.  Gastrointestinal: Negative for abdominal pain and  nausea.  Genitourinary: Negative for difficulty urinating, frequency and vaginal pain.  Musculoskeletal: Negative for back pain and gait problem.  Skin: Negative for pallor and rash.  Neurological: Negative for dizziness, tremors, weakness, numbness and headaches.  Psychiatric/Behavioral: Negative for confusion and sleep disturbance.    Objective:  BP 130/90 (BP Location: Left Arm, Patient Position: Sitting, Cuff Size: Normal)   Pulse 95   Temp 98.1 F (36.7 C) (Oral)   Ht 5\' 1"  (1.549 m)   Wt 171 lb (77.6 kg)   SpO2 97%   BMI 32.31 kg/m   BP Readings from Last 3 Encounters:  04/17/19 130/90  03/07/19 118/78  10/19/18 (!) 163/94    Wt Readings from Last 3 Encounters:  04/17/19 171 lb (77.6 kg)  03/07/19 172 lb (78 kg)  10/18/18 171 lb (77.6 kg)    Physical Exam Constitutional:      General: She is not in acute distress.    Appearance: She is well-developed.  HENT:     Head: Normocephalic.     Right Ear: External ear normal.     Left Ear: External ear normal.     Nose: Nose normal.  Eyes:     General:        Right eye: No discharge.        Left eye: No discharge.     Conjunctiva/sclera: Conjunctivae normal.     Pupils: Pupils are equal, round,  and reactive to light.  Neck:     Musculoskeletal: Normal range of motion and neck supple.     Thyroid: No thyromegaly.     Vascular: No JVD.     Trachea: No tracheal deviation.  Cardiovascular:     Rate and Rhythm: Normal rate and regular rhythm.     Heart sounds: Normal heart sounds.  Pulmonary:     Effort: No respiratory distress.     Breath sounds: No stridor. No wheezing.  Abdominal:     General: Bowel sounds are normal. There is no distension.     Palpations: Abdomen is soft. There is no mass.     Tenderness: There is no abdominal tenderness. There is no guarding or rebound.  Musculoskeletal:        General: Swelling and tenderness present.  Lymphadenopathy:     Cervical: No cervical adenopathy.  Skin:     Findings: No bruising, erythema or rash.  Neurological:     Cranial Nerves: No cranial nerve deficit.     Motor: No abnormal muscle tone.     Coordination: Coordination normal.     Deep Tendon Reflexes: Reflexes normal.  Psychiatric:        Behavior: Behavior normal.        Thought Content: Thought content normal.        Judgment: Judgment normal.   L>R foot swelling  Lab Results  Component Value Date   WBC 8.0 03/07/2019   HGB 13.2 03/07/2019   HCT 43.3 03/07/2019   PLT 319.0 03/07/2019   GLUCOSE 81 03/07/2019   CHOL 192 06/24/2017   TRIG 126.0 06/24/2017   HDL 37.00 (L) 06/24/2017   LDLDIRECT 120.0 05/13/2015   LDLCALC 130 (H) 06/24/2017   ALT 28 10/13/2018   AST 23 10/13/2018   NA 141 03/07/2019   K 3.9 03/07/2019   CL 107 03/07/2019   CREATININE 0.98 03/07/2019   BUN 15 03/07/2019   CO2 26 03/07/2019   TSH 2.06 06/24/2017   INR 0.9 10/13/2018   HGBA1C 6.5 03/07/2019   MICROALBUR 1.5 02/06/2016    Dg Chest 2 View  Result Date: 10/17/2018 CLINICAL DATA:  Cough.  Chest congestion. EXAM: CHEST - 2 VIEW COMPARISON:  None. FINDINGS: The heart size and mediastinal contours are within normal limits. Both lungs are clear. The visualized skeletal structures are unremarkable. IMPRESSION: Normal exam. Electronically Signed   By: Lorriane Shire M.D.   On: 10/17/2018 08:26    Assessment & Plan:   Monica Horn was seen today for leg swelling.  Diagnoses and all orders for this visit:  Need for influenza vaccination -     Flu Vaccine QUAD High Dose(Fluad)     No orders of the defined types were placed in this encounter.    Follow-up: No follow-ups on file.  Walker Kehr, MD

## 2019-04-17 NOTE — Telephone Encounter (Signed)
;  eft ankle/foot edema. Has been occurring over the last 3 weeks. Gets worse as the day goes on. No redness/warmth. Only painful later in the day as more swelling occurs. No calf pain. Denies SOB/CP. No fever/no travels/no known exposures. Attempted warm transfer for appointment, patient was not on the line for transfer. Will attempt to reach her and reach out to pcp again. Reason for Disposition . [1] Thigh, calf, or ankle swelling AND [2] bilateral AND [3] 1 side is more swollen  Answer Assessment - Initial Assessment Questions 1. LOCATION: "Which joint is swollen?"    Left ankle swelling 2. ONSET: "When did the swelling start?"     3 weeks ago 3. SIZE: "How large is the swelling?"     Hanging over the shoe 4. PAIN: "Is there any pain?" If so, ask: "How bad is it?" (Scale 1-10; or mild, moderate, severe)     Usually painful to walk on by the end of the day 5. CAUSE: "What do you think caused the swollen joint?"     fluid 6. OTHER SYMPTOMS: "Do you have any other symptoms?" (e.g., fever, chest pain, difficulty breathing, calf pain)    none 7. PREGNANCY: "Is there any chance you are pregnant?" "When was your last menstrual period?"     na  Protocols used: [facility]

## 2019-04-17 NOTE — Telephone Encounter (Signed)
Pt scheduled for 3pm today

## 2019-04-23 ENCOUNTER — Encounter: Payer: Self-pay | Admitting: Internal Medicine

## 2019-04-23 DIAGNOSIS — M25473 Effusion, unspecified ankle: Secondary | ICD-10-CM

## 2019-04-23 HISTORY — DX: Effusion, unspecified ankle: M25.473

## 2019-04-23 NOTE — Assessment & Plan Note (Signed)
Worse D/c amlodipine

## 2019-04-23 NOTE — Assessment & Plan Note (Signed)
?  L foot 2020 Podiatry ref offered Treat LE edema - d/c'd Norvasc

## 2019-04-27 ENCOUNTER — Telehealth: Payer: Self-pay

## 2019-04-27 DIAGNOSIS — M79672 Pain in left foot: Secondary | ICD-10-CM

## 2019-04-27 NOTE — Telephone Encounter (Signed)
pt would like to know what is the next step. Does she need to follow up with you or a referral to another doctor?

## 2019-04-27 NOTE — Telephone Encounter (Signed)
Written by Cassandria Anger, MD on 04/18/2019 4:51 PM Dear Mrs Verl Blalock,  Your foot X ray shows a "soft tissue swelling without acute bony abnormality". Plan - as we discussed.  Sincerely,  Lew Dawes, MD   Please see above the report I sent.  If the swelling not better after discontinuing amlodipine, we were planning to have Mrs. Schmoker to see a podiatrist.  Should we schedule appointment? Thanks

## 2019-04-27 NOTE — Telephone Encounter (Signed)
LM notifying pt

## 2019-04-27 NOTE — Telephone Encounter (Signed)
Copied from [place] [id number]. Topic: Quick Communication - Other Results (Clinic Use ONLY) >> Apr 27, 2019  8:46 AM Pauline Good wrote: Pt want to know the results from xray on her foot 9.1.20

## 2019-04-30 NOTE — Telephone Encounter (Addendum)
Pt stated she is still having swelling and would like to know if Dr. Alain Marion will send referral to podiatrist. Also she had questions regarding medication. Please advise. Requesting CB from [facility]

## 2019-05-02 NOTE — Telephone Encounter (Signed)
Pt would like referral and she states the triamterene-hydrochlorothiazide gives her a dull headache and would like to know if she can go back on the amlodipine but a half dose since is does not seem to be the cause of her problems

## 2019-05-02 NOTE — Telephone Encounter (Signed)
Is it a referral to Podiatry? Amlodipine is causing foot swelling. Try Triamt-HCTZ 1/2 tab a day - may be able to tolerate a lower dose. Thx

## 2019-05-03 NOTE — Telephone Encounter (Signed)
LM notifying pt and yes a referral to podiatry

## 2019-05-04 NOTE — Addendum Note (Signed)
Addended by: Cassandria Anger on: 05/04/2019 08:36 AM   Modules accepted: Orders

## 2019-05-04 NOTE — Telephone Encounter (Signed)
Done. Thanks.

## 2019-05-24 DIAGNOSIS — Z96642 Presence of left artificial hip joint: Secondary | ICD-10-CM | POA: Diagnosis not present

## 2019-05-24 DIAGNOSIS — Z471 Aftercare following joint replacement surgery: Secondary | ICD-10-CM | POA: Diagnosis not present

## 2019-05-25 ENCOUNTER — Ambulatory Visit: Payer: Medicare Other | Admitting: Podiatry

## 2019-05-30 ENCOUNTER — Other Ambulatory Visit: Payer: Self-pay

## 2019-05-30 ENCOUNTER — Ambulatory Visit: Payer: Medicare Other | Admitting: Podiatry

## 2019-05-30 ENCOUNTER — Encounter: Payer: Self-pay | Admitting: Podiatry

## 2019-05-30 ENCOUNTER — Ambulatory Visit: Payer: Medicare Other

## 2019-05-30 VITALS — BP 106/74 | HR 96 | Resp 16

## 2019-05-30 DIAGNOSIS — M779 Enthesopathy, unspecified: Secondary | ICD-10-CM

## 2019-05-30 DIAGNOSIS — M76822 Posterior tibial tendinitis, left leg: Secondary | ICD-10-CM | POA: Diagnosis not present

## 2019-05-30 DIAGNOSIS — M79672 Pain in left foot: Secondary | ICD-10-CM

## 2019-05-30 NOTE — Progress Notes (Signed)
   Subjective:    Patient ID: Monica Horn, female    DOB: Jul 13, 1946, 73 y.o.   MRN: [id number]  HPI    Review of Systems  All other systems reviewed and are negative.      Objective:   Physical Exam        Assessment & Plan:

## 2019-05-30 NOTE — Progress Notes (Signed)
Subjective:   Patient ID: Monica Horn, female   DOB: 73 y.o.   MRN: [id number]   HPI Patient presents stating he is got no longer pain in the left ankle and also the dorsum of her foot can become inflamed and she has had swelling in her foot.  Patient does not smoke likes to be active   Review of Systems  All other systems reviewed and are negative.       Objective:  Physical Exam Vitals signs and nursing note reviewed.  Constitutional:      Appearance: She is well-developed.  Pulmonary:     Effort: Pulmonary effort is normal.  Musculoskeletal: Normal range of motion.  Skin:    General: Skin is warm.  Neurological:     Mental Status: She is alert.     Neurovascular status was found to be intact muscle strength was found to be adequate range of motion within normal limits with patient noted to have quite a bit of discomfort medial side of the left ankle with inflammation fluid moderate depression of the arch and mild discomfort dorsal with some inflammation of the midfoot and ankle joint with negative Homans sign noted     Assessment:  Posterior tibial tendinitis left with inflammation with mild forefoot pain and swelling noted     Plan:  H&P x-ray reviewed today I did sterile prep and injected the posterior tibial tendon sheath 3 mg Dexasone Kenalog 5 g Xylocaine near its insertion navicular.  Also dispensed ankle compression stocking and fascial brace and recheck again in 2 weeks or earlier if needed

## 2019-06-04 ENCOUNTER — Other Ambulatory Visit: Payer: Self-pay | Admitting: Internal Medicine

## 2019-06-04 DIAGNOSIS — Z1231 Encounter for screening mammogram for malignant neoplasm of breast: Secondary | ICD-10-CM

## 2019-06-07 ENCOUNTER — Ambulatory Visit: Payer: Medicare Other | Admitting: Internal Medicine

## 2019-06-13 ENCOUNTER — Ambulatory Visit: Payer: Medicare Other | Admitting: Podiatry

## 2019-06-14 ENCOUNTER — Ambulatory Visit (INDEPENDENT_AMBULATORY_CARE_PROVIDER_SITE_OTHER): Payer: Medicare Other | Admitting: Podiatry

## 2019-06-14 ENCOUNTER — Encounter: Payer: Self-pay | Admitting: Podiatry

## 2019-06-14 ENCOUNTER — Ambulatory Visit: Payer: Medicare Other | Admitting: Internal Medicine

## 2019-06-14 ENCOUNTER — Other Ambulatory Visit: Payer: Self-pay

## 2019-06-14 DIAGNOSIS — M76822 Posterior tibial tendinitis, left leg: Secondary | ICD-10-CM

## 2019-06-15 NOTE — Progress Notes (Signed)
Subjective:   Patient ID: Monica Horn, female   DOB: 73 y.o.   MRN: [id number]   HPI Patient states it seems that the pain has improved but still is present to a mild nature with brace helping   ROS      Objective:  Physical Exam  Neurovascular status intact with patient found to have flatfoot deformity bilateral with inflammation posterior tib left which seems improved with mild discomfort upon deep palpation     Assessment:  Posterior tibial tendinitis left which has improved but it is still present left with brace usage help     Plan:  H&P reviewed condition I recommended continued brace usage along with supportive shoes and not going barefoot or wearing anything flat.  This may require more treatment or possible immobilization orthotics and patient will be seen back on an as-needed basis

## 2019-06-20 ENCOUNTER — Encounter: Payer: Self-pay | Admitting: Internal Medicine

## 2019-06-20 ENCOUNTER — Telehealth: Payer: Self-pay | Admitting: Internal Medicine

## 2019-06-20 ENCOUNTER — Other Ambulatory Visit (INDEPENDENT_AMBULATORY_CARE_PROVIDER_SITE_OTHER): Payer: Medicare Other

## 2019-06-20 ENCOUNTER — Ambulatory Visit (INDEPENDENT_AMBULATORY_CARE_PROVIDER_SITE_OTHER): Payer: Medicare Other | Admitting: Internal Medicine

## 2019-06-20 ENCOUNTER — Other Ambulatory Visit: Payer: Self-pay

## 2019-06-20 VITALS — BP 116/78 | HR 93 | Temp 97.8°F | Ht 61.0 in | Wt 173.0 lb

## 2019-06-20 DIAGNOSIS — E119 Type 2 diabetes mellitus without complications: Secondary | ICD-10-CM | POA: Diagnosis not present

## 2019-06-20 DIAGNOSIS — E785 Hyperlipidemia, unspecified: Secondary | ICD-10-CM | POA: Diagnosis not present

## 2019-06-20 DIAGNOSIS — M2550 Pain in unspecified joint: Secondary | ICD-10-CM | POA: Diagnosis not present

## 2019-06-20 DIAGNOSIS — H60503 Unspecified acute noninfective otitis externa, bilateral: Secondary | ICD-10-CM

## 2019-06-20 DIAGNOSIS — M25472 Effusion, left ankle: Secondary | ICD-10-CM

## 2019-06-20 DIAGNOSIS — I10 Essential (primary) hypertension: Secondary | ICD-10-CM

## 2019-06-20 LAB — HEPATIC FUNCTION PANEL
ALT: 24 U/L (ref 0–35)
AST: 19 U/L (ref 0–37)
Albumin: 4.4 g/dL (ref 3.5–5.2)
Alkaline Phosphatase: 116 U/L (ref 39–117)
Bilirubin, Direct: 0.1 mg/dL (ref 0.0–0.3)
Total Bilirubin: 0.5 mg/dL (ref 0.2–1.2)
Total Protein: 7.8 g/dL (ref 6.0–8.3)

## 2019-06-20 LAB — BASIC METABOLIC PANEL
BUN: 15 mg/dL (ref 6–23)
CO2: 28 mEq/L (ref 19–32)
Calcium: 9.7 mg/dL (ref 8.4–10.5)
Chloride: 104 mEq/L (ref 96–112)
Creatinine, Ser: 1.03 mg/dL (ref 0.40–1.20)
GFR: 63.63 mL/min (ref >60.00)
Glucose, Bld: 115 mg/dL — ABNORMAL HIGH (ref 70–99)
Potassium: 3.7 mEq/L (ref 3.5–5.1)
Sodium: 140 mEq/L (ref 135–145)

## 2019-06-20 LAB — HEMOGLOBIN A1C: Hgb A1c MFr Bld: 6.4 % (ref 4.6–6.5)

## 2019-06-20 LAB — URIC ACID: Uric Acid, Serum: 5.9 mg/dL (ref 2.4–7.0)

## 2019-06-20 MED ORDER — NEOMYCIN-POLYMYXIN-HC 3.5-10000-1 OT SOLN: 3.0000 [drp] | Freq: Three times a day (TID) | OTIC | 3 refills | Status: AC

## 2019-06-20 NOTE — Telephone Encounter (Signed)
neomycin-polymyxin-hydrocortisone (CORTISPORIN) OTIC solution [id number]  Pt states that this was called in today and she can not pay $ 75.00 and wanted to know if you had any other suggestions

## 2019-06-20 NOTE — Assessment & Plan Note (Signed)
Brace Labs

## 2019-06-20 NOTE — Assessment & Plan Note (Signed)
Corisporin

## 2019-06-20 NOTE — Telephone Encounter (Signed)
Please advise 

## 2019-06-20 NOTE — Assessment & Plan Note (Signed)
  On diet  

## 2019-06-20 NOTE — Assessment & Plan Note (Signed)
Maxzide

## 2019-06-20 NOTE — Assessment & Plan Note (Signed)
Labs

## 2019-06-20 NOTE — Telephone Encounter (Signed)
Can try Miracell ProEar-for Itchy, Irritated Ears -- over-the-counter eardrops Thanks,

## 2019-06-20 NOTE — Progress Notes (Signed)
Subjective:  Patient ID: Monica Horn, female    DOB: 1946/06/21  Age: 73 y.o. MRN: [id number]  CC: No chief complaint on file.   HPI Monica Horn presents for foot pain, B ear complaints - itching,   Outpatient Medications Prior to Visit  Medication Sig Dispense Refill  . cetirizine (ZYRTEC) 10 MG tablet Take 1 tablet (10 mg total) by mouth daily as needed. (Patient taking differently: Take 10 mg by mouth daily as needed for allergies. ) 90 tablet 3  . methocarbamol (ROBAXIN) 500 MG tablet Take 1 tablet (500 mg total) by mouth every 6 (six) hours as needed for muscle spasms. 40 tablet 0  . pantoprazole (PROTONIX) 40 MG tablet TAKE 1 TABLET BY MOUTH  EVERY DAY 90 tablet 1  . Probiotic Product (ALIGN) 4 MG CAPS Take 1 capsule (4 mg total) by mouth daily. 30 capsule 0  . traMADol (ULTRAM) 50 MG tablet Take 1-2 tablets (50-100 mg total) by mouth every 6 (six) hours as needed for moderate pain. 40 tablet 0  . triamterene-hydrochlorothiazide (MAXZIDE-25) 37.5-25 MG tablet Take 1 tablet by mouth daily. (Patient taking differently: Take 1 tablet by mouth daily. Pt stated, "I take a 1/2 pill") 90 tablet 3  . trolamine salicylate (ASPERCREME) 10 % cream Apply 1 application topically 2 (two) times daily as needed for muscle pain.    . rivaroxaban (XARELTO) 10 MG TABS tablet Take 1 tablet (10 mg total) by mouth daily with breakfast for 20 days. Then resume one 81 mg aspirin once a day. 20 tablet 0   No facility-administered medications prior to visit.     ROS: Review of Systems  Constitutional: Negative for activity change, appetite change, chills, fatigue and unexpected weight change.  HENT: Negative for congestion, mouth sores and sinus pressure.   Eyes: Negative for visual disturbance.  Respiratory: Negative for cough and chest tightness.   Gastrointestinal: Negative for abdominal pain and nausea.  Genitourinary: Negative for difficulty urinating, frequency and vaginal pain.   Musculoskeletal: Positive for arthralgias. Negative for back pain and gait problem.  Skin: Negative for pallor and rash.  Neurological: Negative for dizziness, tremors, weakness, numbness and headaches.  Psychiatric/Behavioral: Negative for confusion, sleep disturbance and suicidal ideas.    Objective:  BP 116/78 (BP Location: Left Arm, Patient Position: Sitting, Cuff Size: Normal)   Pulse 93   Temp 97.8 F (36.6 C) (Oral)   Ht 5\' 1"  (1.549 m)   Wt 173 lb (78.5 kg)   SpO2 96%   BMI 32.69 kg/m   BP Readings from Last 3 Encounters:  06/20/19 116/78  05/30/19 106/74  04/17/19 130/90    Wt Readings from Last 3 Encounters:  06/20/19 173 lb (78.5 kg)  04/17/19 171 lb (77.6 kg)  03/07/19 172 lb (78 kg)    Physical Exam Constitutional:      General: She is not in acute distress.    Appearance: She is well-developed.  HENT:     Head: Normocephalic.     Right Ear: External ear normal.     Left Ear: External ear normal.     Nose: Nose normal.  Eyes:     General:        Right eye: No discharge.        Left eye: No discharge.     Conjunctiva/sclera: Conjunctivae normal.     Pupils: Pupils are equal, round, and reactive to light.  Neck:     Musculoskeletal: Normal range of motion and neck  supple.     Thyroid: No thyromegaly.     Vascular: No JVD.     Trachea: No tracheal deviation.  Cardiovascular:     Rate and Rhythm: Normal rate and regular rhythm.     Heart sounds: Normal heart sounds.  Pulmonary:     Effort: No respiratory distress.     Breath sounds: No stridor. No wheezing.  Abdominal:     General: Bowel sounds are normal. There is no distension.     Palpations: Abdomen is soft. There is no mass.     Tenderness: There is no abdominal tenderness. There is no guarding or rebound.  Musculoskeletal:        General: Tenderness present.  Lymphadenopathy:     Cervical: No cervical adenopathy.  Skin:    Findings: No erythema or rash.  Neurological:     Cranial  Nerves: No cranial nerve deficit.     Motor: No abnormal muscle tone.     Coordination: Coordination normal.     Gait: Gait abnormal.     Deep Tendon Reflexes: Reflexes normal.  Psychiatric:        Behavior: Behavior normal.        Thought Content: Thought content normal.        Judgment: Judgment normal.   2nd MCPs B swollen L foot dorsal pain  Lab Results  Component Value Date   WBC 8.0 03/07/2019   HGB 13.2 03/07/2019   HCT 43.3 03/07/2019   PLT 319.0 03/07/2019   GLUCOSE 81 03/07/2019   CHOL 192 06/24/2017   TRIG 126.0 06/24/2017   HDL 37.00 (L) 06/24/2017   LDLDIRECT 120.0 05/13/2015   LDLCALC 130 (H) 06/24/2017   ALT 28 10/13/2018   AST 23 10/13/2018   NA 141 03/07/2019   K 3.9 03/07/2019   CL 107 03/07/2019   CREATININE 0.98 03/07/2019   BUN 15 03/07/2019   CO2 26 03/07/2019   TSH 2.06 06/24/2017   INR 0.9 10/13/2018   HGBA1C 6.5 03/07/2019   MICROALBUR 1.5 02/06/2016    Dg Ankle Complete Left  Result Date: 04/18/2019 CLINICAL DATA:  Ankle swelling for several weeks, no known injury, initial encounter EXAM: LEFT ANKLE COMPLETE - 3+ VIEW COMPARISON:  None. FINDINGS: Generalized soft tissue swelling is noted about the ankle. Tarsal degenerative changes are seen. Calcaneal spurring is noted. No acute fracture or dislocation is noted. IMPRESSION: Soft tissue swelling without acute bony abnormality. Electronically Signed   By: Inez Catalina M.D.   On: 04/18/2019 09:00    Assessment & Plan:   There are no diagnoses linked to this encounter.   No orders of the defined types were placed in this encounter.    Follow-up: No follow-ups on file.  Walker Kehr, MD

## 2019-06-20 NOTE — Patient Instructions (Signed)
What is posterior tibial tendonitis October 20, 2018  posterior tibial tendonitis The posterior tibial tendon is one of the most important tendons in your lower extremities. Its purpose is to stabilize your ankle. It is the tendon that makes it possible for you to point your foot in several directions.  This tendon is prone to overuse injuries that cause inflammation and a host of other symptoms in the ankle and foot. Posterior tibial tendonitis is a common foot and ankle injury, particularly among those who play sports, dancers, and gymnasts. It also affects those with highly active lifestyles and hobbies.  Causes of Posterior Tibial Tendonitis There are several possible causes of an inflamed posterior tibial tendon, such as overuse, lack of healthy blood supply to the area, and poor biomechanics. Some are more prone to developing this condition, such as women, particularly those over the age of 87, and those with anatomical issues, such as flat feet. People who are overweight or obese and diagnosed with diabetes or high blood pressure are also at risk of developing this condition.  Symptoms of Posterior Tibial Tendonitis The symptoms of posterior tibial tendonitis include pain in the ankle that may travel up the calf. If you have this condition, you may experience pain and discomfort when walking, tip-toeing, or going up the stairs - essentially when lifting the affected foot. If left untreated, posterior tibial tendonitis can cause an increasing severity of symptoms.  Treating Posterior Tibial Tendonitis Treatment usually begins with a clinical diagnosis that may require imaging tests in order to rule out other possible conditions. Treatment is usually conservative in the beginning. Your doctor may recommend RICE therapy - rest, ice, compression, and elevation. Anti-inflammatory medication may also be recommended.  In addition, your treatment plan may include physical therapy, which includes  strengthening exercises. The exercises will help relieve pain and boost healing of the posterior tibial tendon. Complete rehabilitation is necessary before returning to sports and normal activities. Surgery is rarely needed to treat this condition. However, if it does not respond well to conservative treatments, you may be recommended by your doctor to get surgery.  When it comes to posterior tibial tendonitis, prevention is always the better option. If you regularly engage in high-intensity activities, such as sports, seeing a sports medicine doctor can help reduce your injury risk. Oftentimes, foot and ankle tendonitis injuries are caused by poor conditioning and form. If you have flat feet, a sports medicine doctor can provide custom shoe orthotics to prevent foot injuries.

## 2019-06-21 LAB — RHEUMATOID FACTOR: Rheumatoid fact SerPl-aCnc: <14 IU/mL (ref <14)

## 2019-06-22 NOTE — Telephone Encounter (Signed)
LM notifying pt

## 2019-07-09 ENCOUNTER — Other Ambulatory Visit: Payer: Self-pay | Admitting: Cardiology

## 2019-07-09 DIAGNOSIS — Z20822 Contact with and (suspected) exposure to covid-19: Secondary | ICD-10-CM

## 2019-07-11 LAB — NOVEL CORONAVIRUS, NAA: SARS-CoV-2, NAA: NOT DETECTED

## 2019-07-15 ENCOUNTER — Other Ambulatory Visit: Payer: Self-pay | Admitting: Internal Medicine

## 2019-07-23 ENCOUNTER — Other Ambulatory Visit: Payer: Self-pay

## 2019-07-23 ENCOUNTER — Ambulatory Visit
Admission: RE | Admit: 2019-07-23 | Discharge: 2019-07-23 | Disposition: A | Discharge status: Home / Self Care | Payer: Medicare Other | Source: Ambulatory Visit | Attending: Internal Medicine | Admitting: Internal Medicine

## 2019-07-23 DIAGNOSIS — Z1231 Encounter for screening mammogram for malignant neoplasm of breast: Secondary | ICD-10-CM | POA: Diagnosis not present

## 2019-07-24 DIAGNOSIS — H25813 Combined forms of age-related cataract, bilateral: Secondary | ICD-10-CM | POA: Diagnosis not present

## 2019-07-24 DIAGNOSIS — H40023 Open angle with borderline findings, high risk, bilateral: Secondary | ICD-10-CM | POA: Diagnosis not present

## 2019-07-24 DIAGNOSIS — H10413 Chronic giant papillary conjunctivitis, bilateral: Secondary | ICD-10-CM | POA: Diagnosis not present

## 2019-07-24 DIAGNOSIS — H5203 Hypermetropia, bilateral: Secondary | ICD-10-CM | POA: Diagnosis not present

## 2019-07-24 DIAGNOSIS — H04123 Dry eye syndrome of bilateral lacrimal glands: Secondary | ICD-10-CM | POA: Diagnosis not present

## 2019-08-23 ENCOUNTER — Telehealth: Payer: Self-pay

## 2019-08-23 NOTE — Telephone Encounter (Signed)
Pt notified this has to be a virtual visit or telephone

## 2019-08-23 NOTE — Telephone Encounter (Signed)
Pt called requesting in person appt with only [facility]. Pt willing to wait until first avail with [facility]. Scheduled 07/02/20.  Pt says concerned of ear infection and sinus infection. No fever no other symptoms. Please advise.

## 2019-08-28 ENCOUNTER — Encounter: Payer: Self-pay | Admitting: Internal Medicine

## 2019-08-28 ENCOUNTER — Other Ambulatory Visit: Payer: Self-pay

## 2019-08-28 ENCOUNTER — Ambulatory Visit: Payer: Medicare Other | Admitting: Internal Medicine

## 2019-08-28 DIAGNOSIS — J069 Acute upper respiratory infection, unspecified: Secondary | ICD-10-CM

## 2019-08-28 MED ORDER — CEFDINIR 300 MG PO CAPS: 300.0000 mg | ORAL_CAPSULE | Freq: Two times a day (BID) | ORAL | 0 refills | Status: DC

## 2019-08-28 NOTE — Progress Notes (Signed)
Virtual Visit via Telephone Note  I connected with Monica Horn on 08/28/19 at  2:40 PM EST by telephone and verified that I am speaking with the correct person using two identifiers.   I discussed the limitations, risks, security and privacy concerns of performing an evaluation and management service by telephone and the availability of in person appointments. I also discussed with the patient that there may be a patient responsible charge related to this service. The patient expressed understanding and agreed to proceed.   History of Present Illness: C/o earache on the R, cough x 1 week, productive. No fever COVID test was (-) x2.   Observations/Objective: Sounds nl on the phone  Assessment and Plan:  See plan Follow Up Instructions:    I discussed the assessment and treatment plan with the patient. The patient was provided an opportunity to ask questions and all were answered. The patient agreed with the plan and demonstrated an understanding of the instructions.   The patient was advised to call back or seek an in-person evaluation if the symptoms worsen or if the condition fails to improve as anticipated.  I provided 11 minutes of non-face-to-face time during this encounter.   Walker Kehr, MD

## 2019-08-28 NOTE — Assessment & Plan Note (Signed)
Sinusitis Omnicef Probiotic

## 2019-08-30 DIAGNOSIS — Z779 Other contact with and (suspected) exposures hazardous to health: Secondary | ICD-10-CM | POA: Diagnosis not present

## 2019-09-01 ENCOUNTER — Other Ambulatory Visit: Payer: Self-pay

## 2019-09-01 DIAGNOSIS — Z20822 Contact with and (suspected) exposure to covid-19: Secondary | ICD-10-CM | POA: Diagnosis not present

## 2019-09-01 DIAGNOSIS — Z8616 Personal history of COVID-19: Secondary | ICD-10-CM

## 2019-09-01 HISTORY — DX: Personal history of COVID-19: Z86.16

## 2019-09-02 LAB — NOVEL CORONAVIRUS, NAA: SARS-CoV-2, NAA: DETECTED — AB

## 2019-09-03 ENCOUNTER — Telehealth: Payer: Self-pay | Admitting: Physician Assistant

## 2019-09-03 NOTE — Telephone Encounter (Signed)
Called to discuss with Monica Horn as follow up to recent Covid test 1/16.  Informed her of her positive test which she was not yet aware of.  Fortunately she is doing quite well.  Unfortunately she participated in a church event 1/17 and also had some family members over later that day.  I informed her that she needed to notify all church and family members of her positive test and have them self isolate then follow up with testing later this week.    Also reviewed her symptoms as part of triage for potential MAB therapy for  those with mild to moderate Covid symptoms and at a high risk of hospitalization.    Pt does not qualify for infusion therapy as her symptoms first presented > 10 days prior to timing of infusion. Symptoms tier reviewed as well as criteria for ending isolation. Preventative practices reviewed. Patient verbalized understanding   Montey Hora, [facility] 09/03/2019, 8:32 AM

## 2019-09-17 ENCOUNTER — Ambulatory Visit: Payer: Medicare Other | Attending: Internal Medicine

## 2019-09-17 DIAGNOSIS — Z20822 Contact with and (suspected) exposure to covid-19: Secondary | ICD-10-CM

## 2019-09-18 ENCOUNTER — Ambulatory Visit: Payer: Medicare Other

## 2019-09-18 LAB — NOVEL CORONAVIRUS, NAA: SARS-CoV-2, NAA: NOT DETECTED

## 2019-09-20 ENCOUNTER — Ambulatory Visit: Payer: Medicare Other | Admitting: Internal Medicine

## 2019-10-02 ENCOUNTER — Telehealth: Payer: Self-pay | Admitting: Internal Medicine

## 2019-10-02 NOTE — Telephone Encounter (Signed)
   Patient calling, wants to know how long after testing positive for covid should she wait to get the vaccine.

## 2019-10-04 ENCOUNTER — Ambulatory Visit: Payer: Medicare Other | Admitting: Internal Medicine

## 2019-10-04 ENCOUNTER — Other Ambulatory Visit: Payer: Self-pay

## 2019-10-04 ENCOUNTER — Ambulatory Visit (INDEPENDENT_AMBULATORY_CARE_PROVIDER_SITE_OTHER): Payer: Medicare Other | Admitting: Internal Medicine

## 2019-10-04 DIAGNOSIS — U071 COVID-19: Secondary | ICD-10-CM | POA: Diagnosis not present

## 2019-10-04 DIAGNOSIS — Z8616 Personal history of COVID-19: Secondary | ICD-10-CM | POA: Insufficient documentation

## 2019-10-04 DIAGNOSIS — I10 Essential (primary) hypertension: Secondary | ICD-10-CM

## 2019-10-04 NOTE — Telephone Encounter (Signed)
Address during [place] today

## 2019-10-04 NOTE — Addendum Note (Signed)
Addended by: Cassandria Anger on: 10/04/2019 09:14 AM   Modules accepted: Level of Service

## 2019-10-04 NOTE — Assessment & Plan Note (Signed)
She and her husband tested positive on 09/01/2019.  Another test on 09/17/2019 was negative.  Both of them did not have any symptoms.  She is asking when they can get vaccinated.  I told Monica Horn that they can get vaccinated at any time.

## 2019-10-04 NOTE — Assessment & Plan Note (Signed)
No change in meds.

## 2019-10-04 NOTE — Progress Notes (Addendum)
Virtual Visit via Telephone Note  I connected with Monica Horn on 10/04/19 at  8:50 AM EST by telephone and verified that I am speaking with the correct person using two identifiers.   I discussed the limitations, risks, security and privacy concerns of performing an evaluation and management service by telephone and the availability of in person appointments. I also discussed with the patient that there may be a patient responsible charge related to this service. The patient expressed understanding and agreed to proceed.   History of Present Illness: Mrs. Monica Horn has a question about Covid vaccination.  She and her husband tested positive on 09/01/2019.  Another test on 09/17/2019 was negative.  Both of them did not have any symptoms.  She is asking when they can get vaccinated.   Observations/Objective: She sounds normal on the phone  Assessment and Plan:  They can get vaccinated at any time Follow Up Instructions:    I discussed the assessment and treatment plan with the patient. The patient was provided an opportunity to ask questions and all were answered. The patient agreed with the plan and demonstrated an understanding of the instructions.   The patient was advised to call back or seek an in-person evaluation if the symptoms worsen or if the condition fails to improve as anticipated.  I provided 10 minutes of non-face-to-face time during this encounter.   Walker Kehr, MD

## 2019-10-18 DIAGNOSIS — Z96642 Presence of left artificial hip joint: Secondary | ICD-10-CM | POA: Diagnosis not present

## 2019-11-01 ENCOUNTER — Ambulatory Visit: Payer: Medicare Other | Admitting: Internal Medicine

## 2019-11-06 ENCOUNTER — Ambulatory Visit: Payer: Medicare Other

## 2019-11-20 ENCOUNTER — Ambulatory Visit (INDEPENDENT_AMBULATORY_CARE_PROVIDER_SITE_OTHER): Payer: Medicare Other

## 2019-11-20 ENCOUNTER — Other Ambulatory Visit: Payer: Self-pay

## 2019-11-20 VITALS — BP 118/80 | HR 80 | Temp 98.3°F | Resp 16 | Ht 61.0 in | Wt 176.2 lb

## 2019-11-20 DIAGNOSIS — Z0000 Encounter for general adult medical examination without abnormal findings: Secondary | ICD-10-CM

## 2019-11-20 NOTE — Progress Notes (Signed)
Subjective:   Monica Horn is a 74 y.o. female who presents for Medicare Annual (Subsequent) preventive examination.  Review of Systems:  Medicare Wellness Exam Cardiac Risk Factors include: advanced age (>4men, >14 women);diabetes mellitus;dyslipidemia  Sleep Patterns: No issues with falling sleep;feels well rested the next day; gets up 1 time to void; gets a total of 8 hours of sleep. Home Safety/Smoke Alarms: Feels safe in home; Smoke alarms in place. Living environment: 1-story home (recently renovated for safety)  Lives with husband. Seat Belt Safety/Bike Helmet: Wears seat belt.     Objective:     Vitals: BP 118/80 (BP Location: Left Arm, Patient Position: Sitting, Cuff Size: Normal)   Pulse 80   Temp 98.3 F (36.8 C)   Resp 16   Ht 5\' 1"  (1.549 m)   Wt 176 lb 3.2 oz (79.9 kg)   SpO2 96%   BMI 33.29 kg/m   Body mass index is 33.29 kg/m.  Advanced Directives 11/20/2019 10/18/2018 10/18/2018 10/13/2018 09/15/2018 09/09/2017 01/16/2015  Does Patient Have a Medical Advance Directive? No No No Yes;No No No No  Does patient want to make changes to medical advance directive? - No - Patient declined No - Patient declined No - Patient declined - - -  Would patient like information on creating a medical advance directive? Yes (MAU/Ambulatory/Procedural Areas - Information given) No - Patient declined No - Patient declined No - Patient declined No - Patient declined Yes (ED - Information included in AVS) No - patient declined information    Tobacco Social History   Tobacco Use  Smoking Status Former Smoker  . Quit date: 28  . Years since quitting: 42.2  Smokeless Tobacco Never Used     Counseling given: No   Clinical Intake:  Pre-visit preparation completed: Yes  Pain : No/denies pain Pain Score: 0-No pain     Nutritional Risks: None Diabetes: Yes CBG done?: No Did pt. bring in CBG monitor from home?: No  How often do you need to have someone help you when you  read instructions, pamphlets, or other written materials from your doctor or pharmacy?: 1 - Never  Interpreter Needed?: No  Information entered by :: Jamiaya Bina N. [place] Guitar, LPN  Past Medical History:  Diagnosis Date  . Abnormal EKG 2014   NONSPECIFIC ST DEPRESSION + T ANORMALITY, WORKED UP 2014 DR GREG TAYLOR AND RELEASED BY DR Lovena Le  . Allergy   . Arthritis   . Blood transfusion without reported diagnosis   . Colon polyps   . Diabetes mellitus    PT. DENIES- no medicines for this   . Fatty infiltration of liver   . GERD (gastroesophageal reflux disease)   . Hydradenitis 01/2009   Right S aureus  . Hyperlipidemia   . Hypertension   . Internal hemorrhoids   . Sinus infection FEB 10-02-2018   FINISHING ANTIBIOTICS NOW  . Tubulovillous adenoma polyp of colon 03/2007   w/ focal high grade dysplasia, no carcinoma   Past Surgical History:  Procedure Laterality Date  . CARPAL TUNNEL RELEASE     Lt  wrist, rt wrist 2014  . COLONOSCOPY    . POLYPECTOMY    . TOTAL HIP ARTHROPLASTY Left 10/18/2018   Procedure: TOTAL HIP ARTHROPLASTY ANTERIOR APPROACH;  Surgeon: Gaynelle Arabian, MD;  Location: [facility];  Service: Orthopedics;  Laterality: Left;  178min  . TUBAL LIGATION     Family History  Problem Relation Age of Onset  . Stroke Mother   .  Hypertension Mother   . Lung cancer Mother   . Cancer Father        ? liver ca  . Alcohol abuse Father   . Hypertension Other   . Stroke Other   . Colon cancer Neg Hx   . Colon polyps Neg Hx   . Rectal cancer Neg Hx   . Stomach cancer Neg Hx    Social History   Socioeconomic History  . Marital status: Married    Spouse name: Not on file  . Number of children: Not on file  . Years of education: Not on file  . Highest education level: Not on file  Occupational History  . Not on file  Tobacco Use  . Smoking status: Former Smoker    Quit date: 1979    Years since quitting: 42.2  . Smokeless tobacco: Never Used  Substance and Sexual  Activity  . Alcohol use: Yes    Comment: occasional  . Drug use: No  . Sexual activity: Yes  Other Topics Concern  . Not on file  Social History Narrative  . Not on file   Social Determinants of Health   Financial Resource Strain:   . Difficulty of Paying Living Expenses:   Food Insecurity:   . Worried About Charity fundraiser in the Last Year:   . Arboriculturist in the Last Year:   Transportation Needs:   . Film/video editor (Medical):   Marland Kitchen Lack of Transportation (Non-Medical):   Physical Activity:   . Days of Exercise per Week:   . Minutes of Exercise per Session:   Stress:   . Feeling of Stress :   Social Connections:   . Frequency of Communication with Friends and Family:   . Frequency of Social Gatherings with Friends and Family:   . Attends Religious Services:   . Active Member of Clubs or Organizations:   . Attends Archivist Meetings:   Marland Kitchen Marital Status:     Outpatient Encounter Medications as of 11/20/2019  Medication Sig  . cetirizine (ZYRTEC) 10 MG tablet Take 1 tablet (10 mg total) by mouth daily as needed. (Patient taking differently: Take 10 mg by mouth daily as needed for allergies. )  . cholecalciferol (VITAMIN D3) 10 MCG (400 UNIT) TABS tablet Take 1,000 Units by mouth daily.  . Multiple Vitamin (MULTIVITAMIN) tablet Take 1 tablet by mouth daily.  . pantoprazole (PROTONIX) 40 MG tablet TAKE 1 TABLET BY MOUTH  DAILY  . triamterene-hydrochlorothiazide (MAXZIDE-25) 37.5-25 MG tablet Take 1 tablet by mouth daily. (Patient taking differently: Take 1 tablet by mouth daily. Pt stated, "I take a 1/2 pill")  . trolamine salicylate (ASPERCREME) 10 % cream Apply 1 application topically 2 (two) times daily as needed for muscle pain.  . cefdinir (OMNICEF) 300 MG capsule Take 1 capsule (300 mg total) by mouth 2 (two) times daily. (Patient not taking: Reported on 11/20/2019)  . methocarbamol (ROBAXIN) 500 MG tablet Take 1 tablet (500 mg total) by mouth every 6  (six) hours as needed for muscle spasms. (Patient not taking: Reported on 11/20/2019)  . Probiotic Product (ALIGN) 4 MG CAPS Take 1 capsule (4 mg total) by mouth daily. (Patient not taking: Reported on 11/20/2019)  . rivaroxaban (XARELTO) 10 MG TABS tablet Take 1 tablet (10 mg total) by mouth daily with breakfast for 20 days. Then resume one 81 mg aspirin once a day.  . traMADol (ULTRAM) 50 MG tablet Take 1-2 tablets (50-100 mg  total) by mouth every 6 (six) hours as needed for moderate pain. (Patient not taking: Reported on 11/20/2019)   No facility-administered encounter medications on file as of 11/20/2019.    Activities of Daily Living In your present state of health, do you have any difficulty performing the following activities: 11/20/2019  Hearing? N  Vision? N  Difficulty concentrating or making decisions? N  Walking or climbing stairs? N  Dressing or bathing? N  Doing errands, shopping? N  Preparing Food and eating ? N  Using the Toilet? N  In the past six months, have you accidently leaked urine? N  Do you have problems with loss of bowel control? N  Managing your Medications? N  Managing your Finances? N  Housekeeping or managing your Housekeeping? N  Some recent data might be hidden    Patient Care Team: Plotnikov, Evie Lacks, MD as PCP - General Ladene Artist, MD (Gastroenterology) Calvert Cantor, MD as Consulting Physician (Ophthalmology)    Assessment:   This is a routine wellness examination for Monica Horn.  Exercise Activities and Dietary recommendations Current Exercise Habits: The patient does not participate in regular exercise at present, Exercise limited by: cardiac condition(s);orthopedic condition(s)  Goals    . Patient Stated     Continue to stimulate my mind to prevent dementia and challenge, write a book about ministry.  Be socially active as possible. Continue to [place], love family and life.    . Patient Stated     I want to write my book, go to [employer]  and become a notary, and decrease some of the committees I am involved in so that I can do more of what I want to do for myself.    . Patient Stated     To take swimming lessons and to complete paperwork for Advanced Directives, Living Will and Power of [place].       Fall Risk Fall Risk  11/20/2019 09/15/2018 09/09/2017 12/17/2016 02/06/2016  Falls in the past year? 0 0 No Yes No  Number falls in past yr: 0 - - 1 -  Injury with Fall? 0 - - Yes -  Risk for fall due to : No Fall Risks - - - -  Follow up Falls evaluation completed;Education provided;Falls prevention discussed - - - -   Is the patient's home free of loose throw rugs in walkways, pet beds, electrical cords, etc?   yes      Grab bars in the bathroom? yes      Handrails on the stairs?   yes      Adequate lighting?   yes  Depression Screen PHQ 2/9 Scores 11/20/2019 09/15/2018 09/09/2017 12/17/2016  PHQ - 2 Score 0 0 0 0  PHQ- 9 Score - - 0 -     Cognitive Function MMSE - Mini Mental State Exam 09/09/2017  Orientation to time 5  Orientation to Place 5  Registration 3  Attention/ Calculation 5  Recall 2  Language- name 2 objects 2  Language- repeat 1  Language- follow 3 step command 3  Language- read & follow direction 1  Write a sentence 1  Copy design 1  Total score 29     6CIT Screen 11/20/2019  What Year? 0 points  What month? 0 points  What time? 0 points  Count back from 20 0 points  Months in reverse 0 points  Repeat phrase 0 points  Total Score 0    Immunization History  Administered Date(s) Administered  .  Fluad Quad(high Dose 65+) 04/17/2019  . Influenza Split 06/13/2012  . Influenza Whole 07/24/2002, 05/22/2008, 06/06/2009, 07/27/2010  . Influenza, High Dose Seasonal PF 06/20/2017, 05/16/2018, 07/18/2018  . Influenza,inj,Quad PF,6+ Mos 08/03/2013, 08/02/2014, 06/27/2015, 05/04/2016  . Influenza,inj,quad, With Preservative 06/16/2017  . Pneumococcal Conjugate-13 10/02/2015  . Pneumococcal  Polysaccharide-23 06/27/2015  . Td 01/31/2009    Qualifies for Shingles Vaccine? declined  Screening Tests Health Maintenance  Topic Date Due  . URINE MICROALBUMIN  02/05/2017  . OPHTHALMOLOGY EXAM  06/16/2018  . TETANUS/TDAP  02/01/2019  . FOOT EXAM  09/16/2019  . HEMOGLOBIN A1C  12/18/2019  . INFLUENZA VACCINE  03/16/2020  . COLONOSCOPY  06/06/2021  . MAMMOGRAM  07/22/2021  . DEXA SCAN  Completed  . Hepatitis C Screening  Completed  . PNA vac Low Risk Adult  Completed    Cancer Screenings: Lung: Low Dose CT Chest recommended if Age 50-80 years, 30 pack-year currently smoking OR have quit w/in 15years. Patient does not qualify. Breast:  Up to date on Mammogram? Yes   Up to date of Bone Density/Dexa? Yes Colorectal: yes     Plan:      Reviewed health maintenance screenings with patient today and relevant education, vaccines, and/or referrals were provided.    Continue doing brain stimulating activities (puzzles, reading, adult coloring books, staying active) to keep memory sharp.    Continue to eat heart healthy diet (full of fruits, vegetables, whole grains, lean protein, water--limit salt, fat, and sugar intake) and increase physical activity as tolerated.   I have personally reviewed and noted the following in the patient's chart:   . Medical and social history . Use of alcohol, tobacco or illicit drugs  . Current medications and supplements . Functional ability and status . Nutritional status . Physical activity . Advanced directives . List of other physicians . Hospitalizations, surgeries, and ER visits in previous 12 months . Vitals . Screenings to include cognitive, depression, and falls . Referrals and appointments  In addition, I have reviewed and discussed with patient certain preventive protocols, quality metrics, and best practice recommendations. A written personalized care plan for preventive services as well as general preventive health  recommendations were provided to patient.     Sheral Flow, LPN  [id number] Nurse Health Advisor

## 2019-11-20 NOTE — Patient Instructions (Addendum)
Monica Horn , Thank you for taking time to come for your Medicare Wellness Visit. I appreciate your ongoing commitment to your health goals. Please review the following plan we discussed and let me know if I can assist you in the future.   Screening recommendations/referrals: Colorectal Screening: due 06/04/2028 Mammogram: due 10/20/2021 Bone Density: due 10/20/2021  Vision and Dental Exams: Recommended annual ophthalmology exams for early detection of glaucoma and other disorders of the eye Recommended annual dental exams for proper oral hygiene  Diabetic Exams: Diabetic Eye Exam: overdue; last done on 06/16/2017 Diabetic Foot Exam: overdue; last done on 09/15/2018  Vaccinations: Influenza vaccine: up to date; last done 04/17/2019 Pneumococcal vaccine: up to date; last done on 06/27/2015 & 10/02/2015 Tdap vaccine: last done on 01/31/2009; Please call your insurance company to determine your out of pocket expense. You may also receive this vaccine at your local pharmacy or Health Dept. Shingles vaccine: declined Covid vaccine: Helix 10/09/2019, 10/30/2019  Advanced directives: Advance directives discussed with you today. I have provided a copy for you to complete at home and have notarized. Once this is complete please bring a copy in to our office so we can scan it into your chart.  Goals:  Recommend to drink at least 6-8 8oz glasses of water per day.  Recommend to exercise for at least 150 minutes per week.  Recommend to remove any items from the home that may cause slips or trips.  Recommend to decrease portion sizes by eating 3 small healthy meals and at least 2 healthy snacks per day.  Recommend to begin DASH diet as directed below  Recommend to continue efforts to reduce smoking habits until no longer smoking. Smoking Cessation literature is attached below.  Next appointment: Please schedule your Annual Wellness Visit with your Nurse Health Advisor in one year.  Preventive Care 23  Years and Older, Female Preventive care refers to lifestyle choices and visits with your health care provider that can promote health and wellness. What does preventive care include?  A yearly physical exam. This is also called an annual well check.  Dental exams once or twice a year.  Routine eye exams. Ask your health care provider how often you should have your eyes checked.  Personal lifestyle choices, including:  Daily care of your teeth and gums.  Regular physical activity.  Eating a healthy diet.  Avoiding tobacco and drug use.  Limiting alcohol use.  Practicing safe sex.  Taking low-dose aspirin every day if recommended by your health care provider.  Taking vitamin and mineral supplements as recommended by your health care provider. What happens during an annual well check? The services and screenings done by your health care provider during your annual well check will depend on your age, overall health, lifestyle risk factors, and family history of disease. Counseling  Your health care provider may ask you questions about your:  Alcohol use.  Tobacco use.  Drug use.  Emotional well-being.  Home and relationship well-being.  Sexual activity.  Eating habits.  History of falls.  Memory and ability to understand (cognition).  Work and work Statistician.  Reproductive health. Screening  You may have the following tests or measurements:  Height, weight, and BMI.  Blood pressure.  Lipid and cholesterol levels. These may be checked every 5 years, or more frequently if you are over 58 years old.  Skin check.  Lung cancer screening. You may have this screening every year starting at age 71 if you have a  30-pack-year history of smoking and currently smoke or have quit within the past 15 years.  Fecal occult blood test (FOBT) of the stool. You may have this test every year starting at age 23.  Flexible sigmoidoscopy or colonoscopy. You may have a  sigmoidoscopy every 5 years or a colonoscopy every 10 years starting at age 69.  Hepatitis C blood test.  Hepatitis B blood test.  Sexually transmitted disease (STD) testing.  Diabetes screening. This is done by checking your blood sugar (glucose) after you have not eaten for a while (fasting). You may have this done every 1-3 years.  Bone density scan. This is done to screen for osteoporosis. You may have this done starting at age 26.  Mammogram. This may be done every 1-2 years. Talk to your health care provider about how often you should have regular mammograms. Talk with your health care provider about your test results, treatment options, and if necessary, the need for more tests. Vaccines  Your health care provider may recommend certain vaccines, such as:  Influenza vaccine. This is recommended every year.  Tetanus, diphtheria, and acellular pertussis (Tdap, Td) vaccine. You may need a Td booster every 10 years.  Zoster vaccine. You may need this after age 67.  Pneumococcal 13-valent conjugate (PCV13) vaccine. One dose is recommended after age 42.  Pneumococcal polysaccharide (PPSV23) vaccine. One dose is recommended after age 29. Talk to your health care provider about which screenings and vaccines you need and how often you need them. This information is not intended to replace advice given to you by your health care provider. Make sure you discuss any questions you have with your health care provider. Document Released: 08/29/2015 Document Revised: 04/21/2016 Document Reviewed: 06/03/2015 Elsevier Interactive Patient Education  2017 [place] Prevention in the Home Falls can cause injuries. They can happen to people of all ages. There are many things you can do to make your home safe and to help prevent falls. What can I do on the outside of my home?  Regularly fix the edges of walkways and driveways and fix any cracks.  Remove anything that might make you  trip as you walk through a door, such as a raised step or threshold.  Trim any bushes or trees on the path to your home.  Use bright outdoor lighting.  Clear any walking paths of anything that might make someone trip, such as rocks or tools.  Regularly check to see if handrails are loose or broken. Make sure that both sides of any steps have handrails.  Any raised decks and porches should have guardrails on the edges.  Have any leaves, snow, or ice cleared regularly.  Use sand or salt on walking paths during winter.  Clean up any spills in your garage right away. This includes oil or grease spills. What can I do in the bathroom?  Use night lights.  Install grab bars by the toilet and in the tub and shower. Do not use towel bars as grab bars.  Use non-skid mats or decals in the tub or shower.  If you need to sit down in the shower, use a plastic, non-slip stool.  Keep the floor dry. Clean up any water that spills on the floor as soon as it happens.  Remove soap buildup in the tub or shower regularly.  Attach bath mats securely with double-sided non-slip rug tape.  Do not have throw rugs and other things on the floor that can make you  trip. What can I do in the bedroom?  Use night lights.  Make sure that you have a light by your bed that is easy to reach.  Do not use any sheets or blankets that are too big for your bed. They should not hang down onto the floor.  Have a firm chair that has side arms. You can use this for support while you get dressed.  Do not have throw rugs and other things on the floor that can make you trip. What can I do in the kitchen?  Clean up any spills right away.  Avoid walking on wet floors.  Keep items that you use a lot in easy-to-reach places.  If you need to reach something above you, use a strong step stool that has a grab bar.  Keep electrical cords out of the way.  Do not use floor polish or wax that makes floors slippery. If  you must use wax, use non-skid floor wax.  Do not have throw rugs and other things on the floor that can make you trip. What can I do with my stairs?  Do not leave any items on the stairs.  Make sure that there are handrails on both sides of the stairs and use them. Fix handrails that are broken or loose. Make sure that handrails are as long as the stairways.  Check any carpeting to make sure that it is firmly attached to the stairs. Fix any carpet that is loose or worn.  Avoid having throw rugs at the top or bottom of the stairs. If you do have throw rugs, attach them to the floor with carpet tape.  Make sure that you have a light switch at the top of the stairs and the bottom of the stairs. If you do not have them, ask someone to add them for you. What else can I do to help prevent falls?  Wear shoes that:  Do not have high heels.  Have rubber bottoms.  Are comfortable and fit you well.  Are closed at the toe. Do not wear sandals.  If you use a stepladder:  Make sure that it is fully opened. Do not climb a closed stepladder.  Make sure that both sides of the stepladder are locked into place.  Ask someone to hold it for you, if possible.  Clearly mark and make sure that you can see:  Any grab bars or handrails.  First and last steps.  Where the edge of each step is.  Use tools that help you move around (mobility aids) if they are needed. These include:  Canes.  Walkers.  Scooters.  Crutches.  Turn on the lights when you go into a dark area. Replace any light bulbs as soon as they burn out.  Set up your furniture so you have a clear path. Avoid moving your furniture around.  If any of your floors are uneven, fix them.  If there are any pets around you, be aware of where they are.  Review your medicines with your doctor. Some medicines can make you feel dizzy. This can increase your chance of falling. Ask your doctor what other things that you can do to  help prevent falls. This information is not intended to replace advice given to you by your health care provider. Make sure you discuss any questions you have with your health care provider. Document Released: 05/29/2009 Document Revised: 01/08/2016 Document Reviewed: 09/06/2014 Elsevier Interactive Patient Education  2017 [employer].

## 2019-12-17 ENCOUNTER — Encounter: Payer: Self-pay | Admitting: Podiatry

## 2019-12-17 ENCOUNTER — Ambulatory Visit (INDEPENDENT_AMBULATORY_CARE_PROVIDER_SITE_OTHER): Payer: Medicare Other

## 2019-12-17 ENCOUNTER — Ambulatory Visit: Payer: Medicare Other | Admitting: Podiatry

## 2019-12-17 ENCOUNTER — Other Ambulatory Visit: Payer: Self-pay

## 2019-12-17 DIAGNOSIS — M76822 Posterior tibial tendinitis, left leg: Secondary | ICD-10-CM

## 2019-12-17 DIAGNOSIS — M778 Other enthesopathies, not elsewhere classified: Secondary | ICD-10-CM

## 2019-12-17 NOTE — Progress Notes (Signed)
Subjective:   Patient ID: Monica Horn, female   DOB: 74 y.o.   MRN: [id number]   HPI She states her left foot has really started to bother her again over the last 6 weeks or 8 weeks and states that it is on the side of the ankle and also on top with 2 separate areas that are hurting her and she feels like she does not have enough support with the ankle brace   ROS      Objective:  Physical Exam  Neurovascular status intact with discomfort posterior tibial as it comes underneath the navicular and also on the dorsum of the foot within the extensor tendon complex     Assessment:  Posterior tibial tendinitis left along with extensor tendon complex inflammation left     Plan:  H&P reviewed conditions did a sterile prep and did inject the posterior tib as it comes under the medial malleolus 3 mg Dexasone Kenalog 5 mg Xylocaine and then did a dorsal extensor injection 3 mg Dexasone Kenalog 5 mg Xylocaine advised on heat ice therapy and placed an air fracture walker to completely immobilize and allow rest and healing.  Reappoint 4 weeks and may require MRI if symptoms persist or come back again

## 2020-01-07 ENCOUNTER — Other Ambulatory Visit: Payer: Self-pay

## 2020-01-07 ENCOUNTER — Encounter: Payer: Self-pay | Admitting: Podiatry

## 2020-01-07 ENCOUNTER — Ambulatory Visit (INDEPENDENT_AMBULATORY_CARE_PROVIDER_SITE_OTHER): Payer: Medicare Other | Admitting: Podiatry

## 2020-01-07 VITALS — Temp 97.5°F

## 2020-01-07 DIAGNOSIS — M76822 Posterior tibial tendinitis, left leg: Secondary | ICD-10-CM

## 2020-01-07 DIAGNOSIS — M778 Other enthesopathies, not elsewhere classified: Secondary | ICD-10-CM | POA: Diagnosis not present

## 2020-01-09 NOTE — Progress Notes (Signed)
Subjective:   Patient ID: Monica Horn, female   DOB: 74 y.o.   MRN: [id number]   HPI Patient presents stating I am feeling better but I have not yet been able to get out of my boot the way I like   ROS      Objective:  Physical Exam  Neuro vascular status intact with patient's left posterior tibial tendon still inflamed but improved with moderate flatfoot deformity noted left still noted that is contributory to problem     Assessment:  Inflammatory tendinitis left posterior tib along with flatfoot deformity which is contributory towards the problem     Plan:  H&P reviewed condition and at this point I went ahead and I discussed the utilization of boot and gradual reduction over the next 4 weeks. I did explain to the patient that foot structure is a big part of the problem and patient will be seen back for [place] to recheck and may ultimately require orthotics but at this time we will try shoe gear modifications anti-inflammatories and continued physical therapy as needed along with topical as needed

## 2020-01-22 DIAGNOSIS — H10413 Chronic giant papillary conjunctivitis, bilateral: Secondary | ICD-10-CM | POA: Diagnosis not present

## 2020-01-22 DIAGNOSIS — H40023 Open angle with borderline findings, high risk, bilateral: Secondary | ICD-10-CM | POA: Diagnosis not present

## 2020-01-22 DIAGNOSIS — H04123 Dry eye syndrome of bilateral lacrimal glands: Secondary | ICD-10-CM | POA: Diagnosis not present

## 2020-01-23 ENCOUNTER — Other Ambulatory Visit: Payer: Self-pay

## 2020-01-23 ENCOUNTER — Ambulatory Visit (INDEPENDENT_AMBULATORY_CARE_PROVIDER_SITE_OTHER): Payer: Medicare Other | Admitting: Internal Medicine

## 2020-01-23 ENCOUNTER — Encounter: Payer: Self-pay | Admitting: Internal Medicine

## 2020-01-23 DIAGNOSIS — E119 Type 2 diabetes mellitus without complications: Secondary | ICD-10-CM | POA: Diagnosis not present

## 2020-01-23 DIAGNOSIS — I10 Essential (primary) hypertension: Secondary | ICD-10-CM | POA: Diagnosis not present

## 2020-01-23 DIAGNOSIS — R600 Localized edema: Secondary | ICD-10-CM | POA: Diagnosis not present

## 2020-01-23 DIAGNOSIS — M79672 Pain in left foot: Secondary | ICD-10-CM | POA: Diagnosis not present

## 2020-01-23 MED ORDER — METHYLPREDNISOLONE 4 MG PO TBPK
ORAL_TABLET | ORAL | 0 refills | Status: DC
Start: 2020-01-23 — End: 2020-05-30

## 2020-01-23 MED ORDER — FUROSEMIDE 20 MG PO TABS: 20.0000 mg | ORAL_TABLET | Freq: Every day | ORAL | 3 refills | Status: DC

## 2020-01-23 MED ORDER — TRIAMTERENE-HCTZ 37.5-25 MG PO TABS: 0.5000 | ORAL_TABLET | Freq: Every day | ORAL | 3 refills | Status: DC

## 2020-01-23 NOTE — Patient Instructions (Signed)
"  Inflammatory tendinitis left posterior tib along with flatfoot deformity which is contributory towards the problem"

## 2020-01-23 NOTE — Assessment & Plan Note (Signed)
Worse Added Furosemide prn

## 2020-01-23 NOTE — Assessment & Plan Note (Signed)
Worse  maxzide 1/2 tab a day (HA if taking one pill)  Add Furosemide prn

## 2020-01-23 NOTE — Assessment & Plan Note (Addendum)
L ankle pain since the Fall of 2020 - saw dr Paulla Dolly L ankle pain since the Fall of 2020 - saw dr Paulla Dolly, was wearing a boot in may 2021 -- "Inflammatory tendinitis left posterior tib along with flatfoot deformity which is contributory towards the problem"  Treat edema of LEs Medrol pac Sports med ref if not better

## 2020-01-23 NOTE — Assessment & Plan Note (Signed)
Labs

## 2020-01-23 NOTE — Progress Notes (Signed)
Subjective:  Patient ID: Vernie Shanks, female    DOB: 05-21-1946  Age: 74 y.o. MRN: [id number]  CC: No chief complaint on file.   HPI Nyssa J Matsuoka presents for HTN, OA, GERD f/u F/u L ankle pain since the Fall of 2020 - saw dr Paulla Dolly  Outpatient Medications Prior to Visit  Medication Sig Dispense Refill  . cefdinir (OMNICEF) 300 MG capsule Take 1 capsule (300 mg total) by mouth 2 (two) times daily. 20 capsule 0  . cetirizine (ZYRTEC) 10 MG tablet Take 1 tablet (10 mg total) by mouth daily as needed. (Patient taking differently: Take 10 mg by mouth daily as needed for allergies. ) 90 tablet 3  . cholecalciferol (VITAMIN D3) 10 MCG (400 UNIT) TABS tablet Take 1,000 Units by mouth daily.    . methocarbamol (ROBAXIN) 500 MG tablet Take 1 tablet (500 mg total) by mouth every 6 (six) hours as needed for muscle spasms. 40 tablet 0  . Multiple Vitamin (MULTIVITAMIN) tablet Take 1 tablet by mouth daily.    . pantoprazole (PROTONIX) 40 MG tablet TAKE 1 TABLET BY MOUTH  DAILY 90 tablet 3  . Probiotic Product (ALIGN) 4 MG CAPS Take 1 capsule (4 mg total) by mouth daily. 30 capsule 0  . traMADol (ULTRAM) 50 MG tablet Take 1-2 tablets (50-100 mg total) by mouth every 6 (six) hours as needed for moderate pain. 40 tablet 0  . triamterene-hydrochlorothiazide (MAXZIDE-25) 37.5-25 MG tablet Take 1 tablet by mouth daily. (Patient taking differently: Take 1 tablet by mouth daily. Pt stated, "I take a 1/2 pill") 90 tablet 3  . trolamine salicylate (ASPERCREME) 10 % cream Apply 1 application topically 2 (two) times daily as needed for muscle pain.    . rivaroxaban (XARELTO) 10 MG TABS tablet Take 1 tablet (10 mg total) by mouth daily with breakfast for 20 days. Then resume one 81 mg aspirin once a day. 20 tablet 0   No facility-administered medications prior to visit.    ROS: Review of Systems  Constitutional: Negative for activity change, appetite change, chills, fatigue and unexpected weight change.    HENT: Negative for congestion, mouth sores and sinus pressure.   Eyes: Negative for visual disturbance.  Respiratory: Negative for cough and chest tightness.   Gastrointestinal: Negative for abdominal pain and nausea.  Genitourinary: Negative for difficulty urinating, frequency and vaginal pain.  Musculoskeletal: Positive for arthralgias. Negative for back pain and gait problem.  Skin: Negative for pallor and rash.  Neurological: Negative for dizziness, tremors, weakness, numbness and headaches.  Psychiatric/Behavioral: Negative for confusion and sleep disturbance.    Objective:  BP (!) 150/90 (BP Location: Right Arm, Patient Position: Sitting, Cuff Size: Normal)   Pulse 67   Temp 98.1 F (36.7 C) (Oral)   Ht 5\' 1"  (1.549 m)   Wt 177 lb 8 oz (80.5 kg)   SpO2 98%   BMI 33.54 kg/m   BP Readings from Last 3 Encounters:  01/23/20 (!) 150/90  11/20/19 118/80  06/20/19 116/78    Wt Readings from Last 3 Encounters:  01/23/20 177 lb 8 oz (80.5 kg)  11/20/19 176 lb 3.2 oz (79.9 kg)  06/20/19 173 lb (78.5 kg)    Physical Exam Constitutional:      General: She is not in acute distress.    Appearance: She is well-developed. She is obese.  HENT:     Head: Normocephalic.     Right Ear: External ear normal.     Left Ear:  External ear normal.     Nose: Nose normal.  Eyes:     General:        Right eye: No discharge.        Left eye: No discharge.     Conjunctiva/sclera: Conjunctivae normal.     Pupils: Pupils are equal, round, and reactive to light.  Neck:     Thyroid: No thyromegaly.     Vascular: No JVD.     Trachea: No tracheal deviation.  Cardiovascular:     Rate and Rhythm: Normal rate and regular rhythm.     Heart sounds: Normal heart sounds.  Pulmonary:     Effort: No respiratory distress.     Breath sounds: No stridor. No wheezing.  Abdominal:     General: Bowel sounds are normal. There is no distension.     Palpations: Abdomen is soft. There is no mass.      Tenderness: There is no abdominal tenderness. There is no guarding or rebound.  Musculoskeletal:        General: No tenderness.     Cervical back: Normal range of motion and neck supple.  Lymphadenopathy:     Cervical: No cervical adenopathy.  Skin:    Findings: No erythema or rash.  Neurological:     Cranial Nerves: No cranial nerve deficit.     Motor: No abnormal muscle tone.     Coordination: Coordination normal.     Deep Tendon Reflexes: Reflexes normal.  Psychiatric:        Behavior: Behavior normal.        Thought Content: Thought content normal.        Judgment: Judgment normal.   B LE edema - trace to 1+ L dorsal foot is tender, swollen  Lab Results  Component Value Date   WBC 8.0 03/07/2019   HGB 13.2 03/07/2019   HCT 43.3 03/07/2019   PLT 319.0 03/07/2019   GLUCOSE 115 (H) 06/20/2019   CHOL 192 06/24/2017   TRIG 126.0 06/24/2017   HDL 37.00 (L) 06/24/2017   LDLDIRECT 120.0 05/13/2015   LDLCALC 130 (H) 06/24/2017   ALT 24 06/20/2019   AST 19 06/20/2019   NA 140 06/20/2019   K 3.7 06/20/2019   CL 104 06/20/2019   CREATININE 1.03 06/20/2019   BUN 15 06/20/2019   CO2 28 06/20/2019   TSH 2.06 06/24/2017   INR 0.9 10/13/2018   HGBA1C 6.4 06/20/2019   MICROALBUR 1.5 02/06/2016    MM 3D SCREEN BREAST BILATERAL  Result Date: 07/23/2019 CLINICAL DATA:  Screening. EXAM: DIGITAL SCREENING BILATERAL MAMMOGRAM WITH TOMO AND CAD COMPARISON:  Previous exam(s). ACR Breast Density Category b: There are scattered areas of fibroglandular density. FINDINGS: There are no findings suspicious for malignancy. Images were processed with CAD. IMPRESSION: No mammographic evidence of malignancy. A result letter of this screening mammogram will be mailed directly to the patient. RECOMMENDATION: Screening mammogram in one year. (Code:SM-B-01Y) BI-RADS CATEGORY  1: Negative. Electronically Signed   By: Lillia Mountain M.D.   On: 07/23/2019 13:35    Assessment & Plan:    Walker Kehr,  MD

## 2020-03-21 ENCOUNTER — Other Ambulatory Visit: Payer: Self-pay | Admitting: Internal Medicine

## 2020-04-24 ENCOUNTER — Encounter: Payer: Self-pay | Admitting: Internal Medicine

## 2020-04-24 ENCOUNTER — Ambulatory Visit (INDEPENDENT_AMBULATORY_CARE_PROVIDER_SITE_OTHER): Payer: Medicare Other | Admitting: Internal Medicine

## 2020-04-24 ENCOUNTER — Other Ambulatory Visit: Payer: Self-pay

## 2020-04-24 DIAGNOSIS — M25472 Effusion, left ankle: Secondary | ICD-10-CM | POA: Diagnosis not present

## 2020-04-24 DIAGNOSIS — J301 Allergic rhinitis due to pollen: Secondary | ICD-10-CM

## 2020-04-24 DIAGNOSIS — I10 Essential (primary) hypertension: Secondary | ICD-10-CM | POA: Diagnosis not present

## 2020-04-24 DIAGNOSIS — E119 Type 2 diabetes mellitus without complications: Secondary | ICD-10-CM | POA: Diagnosis not present

## 2020-04-24 LAB — COMPLETE METABOLIC PANEL WITHOUT GFR
AG Ratio: 1.5 (calc) (ref 1.0–2.5)
ALT: 32 U/L — ABNORMAL HIGH (ref 6–29)
AST: 26 U/L (ref 10–35)
Albumin: 4.5 g/dL (ref 3.6–5.1)
Alkaline phosphatase (APISO): 113 U/L (ref 37–153)
BUN/Creatinine Ratio: 15 (calc) (ref 6–22)
BUN: 15 mg/dL (ref 7–25)
CO2: 29 mmol/L (ref 20–32)
Calcium: 10.1 mg/dL (ref 8.6–10.4)
Chloride: 98 mmol/L (ref 98–110)
Creat: 0.99 mg/dL — ABNORMAL HIGH (ref 0.60–0.93)
GFR, Est African American: 66 mL/min/{1.73_m2}
GFR, Est Non African American: 57 mL/min/{1.73_m2} — ABNORMAL LOW
Globulin: 3.1 g/dL (ref 1.9–3.7)
Glucose, Bld: 159 mg/dL — ABNORMAL HIGH (ref 65–99)
Potassium: 4 mmol/L (ref 3.5–5.3)
Sodium: 137 mmol/L (ref 135–146)
Total Bilirubin: 0.6 mg/dL (ref 0.2–1.2)
Total Protein: 7.6 g/dL (ref 6.1–8.1)

## 2020-04-24 MED ORDER — MELOXICAM 15 MG PO TABS
15.0000 mg | ORAL_TABLET | Freq: Every day | ORAL | 2 refills | Status: DC | PRN
Start: 2020-04-24 — End: 2020-05-30

## 2020-04-24 NOTE — Assessment & Plan Note (Signed)
Ref to Rheum - Dr Benjamine Mola Meloxicam [facility]

## 2020-04-24 NOTE — Progress Notes (Signed)
Subjective:  Patient ID: Monica Horn, female    DOB: 10/10/1945  Age: 74 y.o. MRN: [id number]  CC: No chief complaint on file.   HPI Monica Horn presents for L foot pain, swelling - recurrent F/u HTN, DM   Outpatient Medications Prior to Visit  Medication Sig Dispense Refill  . cefdinir (OMNICEF) 300 MG capsule Take 1 capsule (300 mg total) by mouth 2 (two) times daily. 20 capsule 0  . cetirizine (ZYRTEC) 10 MG tablet Take 1 tablet (10 mg total) by mouth daily as needed. (Patient taking differently: Take 10 mg by mouth daily as needed for allergies. ) 90 tablet 3  . cholecalciferol (VITAMIN D3) 10 MCG (400 UNIT) TABS tablet Take 1,000 Units by mouth daily.    . furosemide (LASIX) 20 MG tablet Take 1-2 tablets (20-40 mg total) by mouth daily. 60 tablet 3  . methocarbamol (ROBAXIN) 500 MG tablet Take 1 tablet (500 mg total) by mouth every 6 (six) hours as needed for muscle spasms. 40 tablet 0  . methylPREDNISolone (MEDROL DOSEPAK) 4 MG TBPK tablet As directed 21 tablet 0  . Multiple Vitamin (MULTIVITAMIN) tablet Take 1 tablet by mouth daily.    . pantoprazole (PROTONIX) 40 MG tablet TAKE 1 TABLET BY MOUTH  DAILY 90 tablet 3  . Probiotic Product (ALIGN) 4 MG CAPS Take 1 capsule (4 mg total) by mouth daily. 30 capsule 0  . traMADol (ULTRAM) 50 MG tablet Take 1-2 tablets (50-100 mg total) by mouth every 6 (six) hours as needed for moderate pain. 40 tablet 0  . triamterene-hydrochlorothiazide (MAXZIDE-25) 37.5-25 MG tablet Take 1 tablet by mouth daily. Annual appt due in November must see provider for future refills 90 tablet 0  . trolamine salicylate (ASPERCREME) 10 % cream Apply 1 application topically 2 (two) times daily as needed for muscle pain.     No facility-administered medications prior to visit.    ROS: Review of Systems  Constitutional: Negative for activity change, appetite change, chills, fatigue and unexpected weight change.  HENT: Negative for congestion, mouth  sores and sinus pressure.   Eyes: Negative for visual disturbance.  Respiratory: Negative for cough and chest tightness.   Gastrointestinal: Negative for abdominal pain and nausea.  Genitourinary: Negative for difficulty urinating, frequency and vaginal pain.  Musculoskeletal: Negative for back pain and gait problem.  Skin: Negative for pallor and rash.  Neurological: Negative for dizziness, tremors, weakness, numbness and headaches.  Psychiatric/Behavioral: Negative for confusion and sleep disturbance.    Objective:  BP 122/80 (BP Location: Right Arm, Patient Position: Sitting, Cuff Size: Large)   Pulse (!) 104   Temp 98.4 F (36.9 C) (Oral)   Ht 5\' 1"  (1.549 m)   Wt 177 lb (80.3 kg)   SpO2 96%   BMI 33.44 kg/m   BP Readings from Last 3 Encounters:  04/24/20 122/80  01/23/20 (!) 150/90  11/20/19 118/80    Wt Readings from Last 3 Encounters:  04/24/20 177 lb (80.3 kg)  01/23/20 177 lb 8 oz (80.5 kg)  11/20/19 176 lb 3.2 oz (79.9 kg)    Physical Exam Constitutional:      General: She is not in acute distress.    Appearance: She is well-developed. She is obese.  HENT:     Head: Normocephalic.     Right Ear: External ear normal.     Left Ear: External ear normal.     Nose: Nose normal.  Eyes:     General:  Right eye: No discharge.        Left eye: No discharge.     Conjunctiva/sclera: Conjunctivae normal.     Pupils: Pupils are equal, round, and reactive to light.  Neck:     Thyroid: No thyromegaly.     Vascular: No JVD.     Trachea: No tracheal deviation.  Cardiovascular:     Rate and Rhythm: Normal rate and regular rhythm.     Heart sounds: Normal heart sounds.  Pulmonary:     Effort: No respiratory distress.     Breath sounds: No stridor. No wheezing.  Abdominal:     General: Bowel sounds are normal. There is no distension.     Palpations: Abdomen is soft. There is no mass.     Tenderness: There is no abdominal tenderness. There is no guarding or  rebound.  Musculoskeletal:        General: Swelling and tenderness present.     Cervical back: Normal range of motion and neck supple.  Lymphadenopathy:     Cervical: No cervical adenopathy.  Skin:    Findings: No erythema or rash.  Neurological:     Mental Status: She is oriented to person, place, and time.     Cranial Nerves: No cranial nerve deficit.     Motor: No abnormal muscle tone.     Coordination: Coordination normal.     Gait: Gait abnormal.     Deep Tendon Reflexes: Reflexes normal.  Psychiatric:        Behavior: Behavior normal.        Thought Content: Thought content normal.        Judgment: Judgment normal.   edema L ankle and foot, pain   Lab Results  Component Value Date   WBC 8.0 03/07/2019   HGB 13.2 03/07/2019   HCT 43.3 03/07/2019   PLT 319.0 03/07/2019   GLUCOSE 115 (H) 06/20/2019   CHOL 192 06/24/2017   TRIG 126.0 06/24/2017   HDL 37.00 (L) 06/24/2017   LDLDIRECT 120.0 05/13/2015   LDLCALC 130 (H) 06/24/2017   ALT 24 06/20/2019   AST 19 06/20/2019   NA 140 06/20/2019   K 3.7 06/20/2019   CL 104 06/20/2019   CREATININE 1.03 06/20/2019   BUN 15 06/20/2019   CO2 28 06/20/2019   TSH 2.06 06/24/2017   INR 0.9 10/13/2018   HGBA1C 6.4 06/20/2019   MICROALBUR 1.5 02/06/2016    MM 3D SCREEN BREAST BILATERAL  Result Date: 07/23/2019 CLINICAL DATA:  Screening. EXAM: DIGITAL SCREENING BILATERAL MAMMOGRAM WITH TOMO AND CAD COMPARISON:  Previous exam(s). ACR Breast Density Category b: There are scattered areas of fibroglandular density. FINDINGS: There are no findings suspicious for malignancy. Images were processed with CAD. IMPRESSION: No mammographic evidence of malignancy. A result letter of this screening mammogram will be mailed directly to the patient. RECOMMENDATION: Screening mammogram in one year. (Code:SM-B-01Y) BI-RADS CATEGORY  1: Negative. Electronically Signed   By: Lillia Mountain M.D.   On: 07/23/2019 13:35    Assessment & Plan:    Walker Kehr, MD

## 2020-04-24 NOTE — Addendum Note (Signed)
Addended by: Cresenciano Lick on: 04/24/2020 11:54 AM   Modules accepted: Orders

## 2020-04-24 NOTE — Patient Instructions (Signed)
[place] started vaccine booster sign up. Please call [facility] at [phone].

## 2020-04-24 NOTE — Assessment & Plan Note (Signed)
Re-start Zyrtec

## 2020-04-24 NOTE — Assessment & Plan Note (Signed)
Labs

## 2020-04-24 NOTE — Assessment & Plan Note (Signed)
BP Readings from Last 3 Encounters:  04/24/20 122/80  01/23/20 (!) 150/90  11/20/19 118/80

## 2020-04-25 LAB — HEMOGLOBIN A1C
Hgb A1c MFr Bld: 6.9 % of total Hgb — ABNORMAL HIGH (ref <5.7)
Mean Plasma Glucose: 151 (calc)
eAG (mmol/L): 8.4 (calc)

## 2020-04-28 ENCOUNTER — Telehealth: Payer: Self-pay | Admitting: Internal Medicine

## 2020-04-28 NOTE — Telephone Encounter (Signed)
Patient would like an antibiotic called in (one she usually gets, can't remember the name) Needs two tier  For allergy and sinus area  [place] ([place]), [place] - [address] Phone:  [phone]  Fax:  [phone]

## 2020-04-30 MED ORDER — CEFDINIR 300 MG PO CAPS
300.0000 mg | ORAL_CAPSULE | Freq: Two times a day (BID) | ORAL | 0 refills | Status: DC
Start: 2020-04-30 — End: 2020-10-14

## 2020-04-30 NOTE — Telephone Encounter (Signed)
See med refill.

## 2020-04-30 NOTE — Telephone Encounter (Signed)
Please advise 

## 2020-04-30 NOTE — Telephone Encounter (Signed)
OK - Rx emailed Thx 

## 2020-05-21 DIAGNOSIS — I10 Essential (primary) hypertension: Secondary | ICD-10-CM | POA: Diagnosis not present

## 2020-05-21 DIAGNOSIS — Z8679 Personal history of other diseases of the circulatory system: Secondary | ICD-10-CM | POA: Diagnosis not present

## 2020-05-21 DIAGNOSIS — Z008 Encounter for other general examination: Secondary | ICD-10-CM | POA: Diagnosis not present

## 2020-05-29 NOTE — Progress Notes (Signed)
Office Visit Note  Patient: Monica Horn             Date of Birth: [birth year]           MRN: [id number]             PCP: Cassandria Anger, MD Referring: Cassandria Anger, MD Visit Date: 05/30/2020   Subjective:  Pain and Edema of the Left Foot, Pain and Edema of the Left Ankle, and New Patient (Initial Visit)  History of Present Illness: Monica Horn is a 74 y.o. female Here for pain and swelling of the left foot and also bilateral hand pain. These problems are longstanding but have become more bothersome in the past year. Podiatry evaluation has shown several xrays demonstrating degenerative changes in the midfoot and was treated with soft and hard boots and steroid injections. Her hands are also painful but does not notice swelling or redness. She has morning stiffness lasting usually less than 15 minutes. She was prescribed meloxicam by her PCP but does not take this often since she avoids it when symptoms are mild. She has had left sided hip arthroplasty in the past.  Labs 06/2019 RF negative Uric acid 5.9  02/2019 Iron, TIBC, %sat, Ferritin normal  Xray left foot 12/2019 No erosive changes, demineralization, or soft tissue  Activities of Daily Living:  Patient reports morning stiffness for 5-10 minutes.   Patient Reports nocturnal pain.  Difficulty dressing/grooming: Denies Difficulty climbing stairs: Reports Difficulty getting out of chair: Denies Difficulty using hands for taps, buttons, cutlery, and/or writing: Reports  Review of Systems  Constitutional: Negative for fatigue.  HENT: Negative for mouth sores, mouth dryness and nose dryness.   Eyes: Negative for pain, itching, visual disturbance and dryness.  Respiratory: Negative for cough, hemoptysis, shortness of breath and difficulty breathing.   Cardiovascular: Negative for chest pain, palpitations and swelling in legs/feet.  Gastrointestinal: Negative for abdominal pain, blood in stool, constipation  and diarrhea.  Endocrine: Negative for increased urination.  Genitourinary: Negative for painful urination.  Musculoskeletal: Positive for arthralgias, joint pain, joint swelling and morning stiffness. Negative for myalgias, muscle weakness, muscle tenderness and myalgias.  Skin: Positive for skin tightness. Negative for color change, rash and redness.  Allergic/Immunologic: Negative for susceptible to infections.  Neurological: Negative for dizziness, headaches, memory loss and weakness.  Hematological: Negative for swollen glands.  Psychiatric/Behavioral: Negative for confusion and sleep disturbance.    PMFS History:  Patient Active Problem List   Diagnosis Date Noted  . Lab test positive for detection of COVID-19 virus 10/04/2019  . Ankle swelling 04/23/2019  . History of revision of total replacement of left hip joint 11/17/2018  . OA (osteoarthritis) of hip 10/18/2018  . Hip pain 10/02/2018  . Gastric polyp 07/18/2018  . Colon polyps 07/18/2018  . Nose fracture 12/17/2016  . Closed undisplaced fracture of nasal bone 12/09/2016  . Forehead contusion, initial encounter 11/19/2016  . Well adult exam 04/09/2016  . Ganglion cyst 11/11/2015  . Allergic rhinitis 11/11/2015  . Cough 10/24/2014  . Ingrown right big toenail 09/27/2014  . Arthralgia 07/02/2014  . Rash and nonspecific skin eruption 04/05/2014  . Eruption cyst 04/05/2014  . UTI (urinary tract infection) 01/04/2014  . Grief 05/10/2013  . Near syncope 10/13/2012  . Chest pain, atypical 08/26/2012  . GERD (gastroesophageal reflux disease) 08/26/2012  . Abnormal EKG 08/26/2012  . Foot pain 06/13/2012  . URI, acute 06/11/2011  . Finger contusion 11/30/2010  . Shoulder pain,  right 11/30/2010  . Otitis externa 07/27/2010  . Cerumen impaction 12/01/2009  . CYSTITIS 09/08/2009  . DIZZINESS 04/04/2009  . CELLULITIS AND ABSCESS OF OTHER SPECIFIED SITE 01/31/2009  . ACUTE TONSILLITIS 08/05/2008  . Edema 03/22/2008  .  ANEMIA 10/27/2007  . Carpal tunnel syndrome 10/27/2007  . HEMORRHOIDS 10/27/2007  . Blood in stool 10/27/2007  . Hemorrhage of gastrointestinal tract 10/27/2007  . Osteoarthritis 10/27/2007  . Hematochezia 10/27/2007  . Other abnormal glucose 10/24/2007  . Essential hypertension 07/11/2007  . History of colonic polyps 07/11/2007  . Dyslipidemia 06/20/2007  . Diabetes type 2, controlled ([place]) 06/12/2007    Past Medical History:  Diagnosis Date  . Abnormal EKG 2014   NONSPECIFIC ST DEPRESSION + T ANORMALITY, WORKED UP 2014 DR GREG TAYLOR AND RELEASED BY DR Lovena Le  . Allergy   . Arthritis   . Blood transfusion without reported diagnosis   . Colon polyps   . Diabetes mellitus    PT. DENIES- no medicines for this   . Fatty infiltration of liver   . GERD (gastroesophageal reflux disease)   . Hydradenitis 01/2009   Right S aureus  . Hyperlipidemia   . Hypertension   . Internal hemorrhoids   . Sinus infection FEB 10-02-2018   FINISHING ANTIBIOTICS NOW  . Tubulovillous adenoma polyp of colon 03/2007   w/ focal high grade dysplasia, no carcinoma    Family History  Problem Relation Age of Onset  . Stroke Mother   . Hypertension Mother   . Lung cancer Mother   . Cancer Father        ? liver ca  . Alcohol abuse Father   . Hypertension Other   . Stroke Other   . Colon cancer Neg Hx   . Colon polyps Neg Hx   . Rectal cancer Neg Hx   . Stomach cancer Neg Hx    Past Surgical History:  Procedure Laterality Date  . CARPAL TUNNEL RELEASE     Lt  wrist, rt wrist 2014  . COLONOSCOPY    . POLYPECTOMY    . TOTAL HIP ARTHROPLASTY Left 10/18/2018   Procedure: TOTAL HIP ARTHROPLASTY ANTERIOR APPROACH;  Surgeon: Gaynelle Arabian, MD;  Location: [facility];  Service: Orthopedics;  Laterality: Left;  152min  . TUBAL LIGATION     Social History   Social History Narrative  . Not on file   Immunization History  Administered Date(s) Administered  . Fluad Quad(high Dose 65+) 04/17/2019  .  Influenza Split 06/13/2012  . Influenza Whole 07/24/2002, 05/22/2008, 06/06/2009, 07/27/2010  . Influenza, High Dose Seasonal PF 06/20/2017, 05/16/2018, 07/18/2018  . Influenza,inj,Quad PF,6+ Mos 08/03/2013, 08/02/2014, 06/27/2015, 05/04/2016  . Influenza,inj,quad, With Preservative 06/16/2017  . PFIZER SARS-COV-2 Vaccination 10/09/2019, 10/30/2019  . Pneumococcal Conjugate-13 10/02/2015  . Pneumococcal Polysaccharide-23 06/27/2015  . Td 01/31/2009     Objective: Vital Signs: BP 136/86 (BP Location: Right Arm, Patient Position: Sitting, Cuff Size: Small)   Pulse 89   Ht 5' 1.25" (1.556 m)   Wt 176 lb 6.4 oz (80 kg)   BMI 33.06 kg/m    Physical Exam Constitutional:      Appearance: Normal appearance.  HENT:     Right Ear: External ear normal.     Left Ear: External ear normal.     Mouth/Throat:     Mouth: Mucous membranes are moist.     Pharynx: Oropharynx is clear.  Eyes:     Conjunctiva/sclera: Conjunctivae normal.     Pupils: Pupils are equal,  round, and reactive to light.  Cardiovascular:     Rate and Rhythm: Normal rate and regular rhythm.  Pulmonary:     Effort: Pulmonary effort is normal.     Breath sounds: Normal breath sounds.  Skin:    General: Skin is warm and dry.  Neurological:     Mental Status: She is alert.       Musculoskeletal Exam:  Neck full ROM Shoulders, elbows full ROM no tenderness or swelling Hands show small bony enlargement at MCP joints without soft tissue swelling, some rotation of 5th PIP and DIP joints Knees full ROM no swelling Ankles normal but left swelling over left dorsal midfoot without warmth or redness, no right foot swelling, MTPs nontender no swelling  CDAI Exam: CDAI Score: -- Patient Global: --; Provider Global: -- Swollen: --; Tender: -- Joint Exam 05/30/2020   No joint exam has been documented for this visit   There is currently no information documented on the homunculus. Go to the Rheumatology activity and  complete the homunculus joint exam.  Investigation: No additional findings.  Imaging: XR Hand 2 View Left  Result Date: 05/30/2020 X-ray 2 view left hand Normal radiocarpal joint space.  Carpal bones and joints overall normal appearance.  Her CMC joint osteophyte.  Second through fourth MCP joints small osteophytes on phalanx.  Fifth digit irregular joint space and PIP.  Otherwise PIP and DIP joint spaces overall preserved.  Normal bone mineralization overall.  No soft tissue swelling seen. Impression Mild degenerative arthritis changes are present  XR Hand 2 View Right  Result Date: 05/30/2020 X-ray two-view right hand Normal radiocarpal joint space.  Carpal joints also overall normal.  Small hooklike osteophyte on the radial side of the second and third metacarpal heads.  Partial joint space loss of second and third MCP with partial subluxation of third MCP.  Third PIP irregularity question cystic changes versus very tiny erosion.  Partial uneven joint space loss on fifth PIP and DIP.  Bone mineralization overall normal.  No soft tissue swelling seen. Impression Mild degenerative changes seen but with involvement of MCP joints cannot exclude systemic or inflammatory metabolic process   Recent Labs: Lab Results  Component Value Date   WBC 8.0 03/07/2019   HGB 13.2 03/07/2019   PLT 319.0 03/07/2019   NA 137 04/24/2020   K 4.0 04/24/2020   CL 98 04/24/2020   CO2 29 04/24/2020   GLUCOSE 159 (H) 04/24/2020   BUN 15 04/24/2020   CREATININE 0.99 (H) 04/24/2020   BILITOT 0.6 04/24/2020   ALKPHOS 116 06/20/2019   AST 26 04/24/2020   ALT 32 (H) 04/24/2020   PROT 7.6 04/24/2020   ALBUMIN 4.4 06/20/2019   CALCIUM 10.1 04/24/2020   GFRAA 66 04/24/2020    Speciality Comments: No specialty comments available.  Procedures:  No procedures performed Allergies: Amlodipine, Benazepril, Losartan, and Lovastatin   Assessment / Plan:     Visit Diagnoses: Osteoarthritis of multiple joints,  unspecified osteoarthritis type  Left ankle swelling - Plan: celecoxib (CELEBREX) 200 MG capsule, DISCONTINUED: celecoxib (CELEBREX) 200 MG capsule, DISCONTINUED: celecoxib (CELEBREX) 200 MG capsule  Soft tissue swelling over the left midfoot on personal review of xray images and point of care ultrasound today osteophytes seem most consistent with degenerative arthritis. Normal uric acid and no clear history of episodic attacks for gout. Negative RF and no soft tissue swelling or demineralization seen. I discussed no specific DMARD recommended and would use NSAIDs as needed for symptoms. She has  history of GI bleed with anemia so recommended celecoxib may be safer long term option than meloxicam. If cost prohibitive nonselective NSAID is still reasonable but would maintain use of PPI for GI protection.  Bilateral hand pain - Plan: XR Hand 2 View Right, XR Hand 2 View Left  She has bony enlargement of joints on both hands. These are more prominent on proximal joints that can be suggestive for inflammatory arthritis or secondary OA but no other suggestive features. Normal iron studies and liver function. No chondrocalcinosis of TFC. Discussed use of NSAIDs as for other OA above although may be less effective in hand OA.  Orders: Orders Placed This Encounter  Procedures  . XR Hand 2 View Right  . XR Hand 2 View Left   Meds ordered this encounter  Medications  . DISCONTD: celecoxib (CELEBREX) 200 MG capsule    Sig: Take 1 capsule (200 mg total) by mouth daily as needed for moderate pain.    Dispense:  60 capsule    Refill:  2  . DISCONTD: celecoxib (CELEBREX) 200 MG capsule    Sig: Take 1 capsule (200 mg total) by mouth daily as needed.    Dispense:  60 capsule    Refill:  2  . celecoxib (CELEBREX) 200 MG capsule    Sig: Take 1 capsule (200 mg total) by mouth daily as needed.    Dispense:  60 capsule    Refill:  2     Follow-Up Instructions: No follow-ups on file.   Collier Salina,  MD  Note - This record has been created using [employer].  Chart creation errors have been sought, but may not always  have been located. Such creation errors do not reflect on  the standard of medical care.

## 2020-05-30 ENCOUNTER — Ambulatory Visit: Payer: Medicare Other | Admitting: Internal Medicine

## 2020-05-30 ENCOUNTER — Other Ambulatory Visit: Payer: Self-pay

## 2020-05-30 ENCOUNTER — Encounter: Payer: Self-pay | Admitting: Internal Medicine

## 2020-05-30 ENCOUNTER — Ambulatory Visit: Payer: Self-pay

## 2020-05-30 VITALS — BP 136/86 | HR 89 | Ht 61.25 in | Wt 176.4 lb

## 2020-05-30 DIAGNOSIS — M159 Polyosteoarthritis, unspecified: Secondary | ICD-10-CM | POA: Diagnosis not present

## 2020-05-30 DIAGNOSIS — M25472 Effusion, left ankle: Secondary | ICD-10-CM

## 2020-05-30 DIAGNOSIS — M79641 Pain in right hand: Secondary | ICD-10-CM

## 2020-05-30 DIAGNOSIS — M79642 Pain in left hand: Secondary | ICD-10-CM

## 2020-05-30 MED ORDER — CELECOXIB 200 MG PO CAPS: 200.0000 mg | ORAL_CAPSULE | Freq: Every day | ORAL | 2 refills | Status: DC | PRN

## 2020-05-30 NOTE — Patient Instructions (Addendum)
I recommend changing from meloxicam to celebrex (celecoxib) as an antiinflammatory medicine for your foot arthritis. This can be taken daily as needed for pain but does not need to be taken every day. Do not taken this alongside any ibuprofen, aleve, or meloxicam. Continue taking pantoprazole for acid reflux medicine this also reduces side effects of antiinflammatory medication.   You can also try using Voltaren (diclofenac) gel for the foot pain and swelling this is an over the counter medication that does not have much side effect risk and can be helpful for some patients with arthritis.      Osteoarthritis  Osteoarthritis is a type of arthritis that affects tissue that covers the ends of bones in joints (cartilage). Cartilage acts as a cushion between the bones and helps them move smoothly. Osteoarthritis results when cartilage in the joints gets worn down. Osteoarthritis is sometimes called "wear and tear" arthritis. Osteoarthritis is the most common form of arthritis. It often occurs in older people. It is a condition that gets worse over time (a progressive condition). Joints that are most often affected by this condition are in:  Fingers.  Toes.  Hips.  Knees.  Spine, including neck and lower back. What are the causes? This condition is caused by age-related wearing down of cartilage that covers the ends of bones. What increases the risk? The following factors may make you more likely to develop this condition:  Older age.  Being overweight or obese.  Overuse of joints, such as in athletes.  Past injury of a joint.  Past surgery on a joint.  Family history of osteoarthritis. What are the signs or symptoms? The main symptoms of this condition are pain, swelling, and stiffness in the joint. The joint may lose its shape over time. Small pieces of bone or cartilage may break off and float inside of the joint, which may cause more pain and damage to the joint. Small deposits  of bone (osteophytes) may grow on the edges of the joint. Other symptoms may include:  A grating or scraping feeling inside the joint when you move it.  Popping or creaking sounds when you move. Symptoms may affect one or more joints. Osteoarthritis in a major joint, such as your knee or hip, can make it painful to walk or exercise. If you have osteoarthritis in your hands, you might not be able to grip items, twist your hand, or control small movements of your hands and fingers (fine motor skills). How is this diagnosed? This condition may be diagnosed based on:  Your medical history.  A physical exam.  Your symptoms.  X-rays of the affected joint(s).  Blood tests to rule out other types of arthritis. How is this treated? There is no cure for this condition, but treatment can help to control pain and improve joint function. Treatment plans may include:  A prescribed exercise program that allows for rest and joint relief. You may work with a physical therapist.  A weight control plan.  Pain relief techniques, such as: ? Applying heat and cold to the joint. ? Electric pulses delivered to nerve endings under the skin (transcutaneous electrical nerve stimulation, or TENS). ? Massage. ? Certain nutritional supplements.  NSAIDs or prescription medicines to help relieve pain.  Medicine to help relieve pain and inflammation (corticosteroids). This can be given by mouth (orally) or as an injection.  Assistive devices, such as a brace, wrap, splint, specialized glove, or cane.  Surgery, such as: ? An osteotomy. This is done  to reposition the bones and relieve pain or to remove loose pieces of bone and cartilage. ? Joint replacement surgery. You may need this surgery if you have very bad (advanced) osteoarthritis. Follow these instructions at home: Activity  Rest your affected joints as directed by your health care provider.  Do not drive or use heavy machinery while taking  prescription pain medicine.  Exercise as directed. Your health care provider or physical therapist may recommend specific types of exercise, such as: ? Strengthening exercises. These are done to strengthen the muscles that support joints that are affected by arthritis. They can be performed with weights or with exercise bands to add resistance. ? Aerobic activities. These are exercises, such as brisk walking or water aerobics, that get your heart pumping. ? Range-of-motion activities. These keep your joints easy to move. ? Balance and agility exercises. Managing pain, stiffness, and swelling      If directed, apply heat to the affected area as often as told by your health care provider. Use the heat source that your health care provider recommends, such as a moist heat pack or a heating pad. ? If you have a removable assistive device, remove it as told by your health care provider. ? Place a towel between your skin and the heat source. If your health care provider tells you to keep the assistive device on while you apply heat, place a towel between the assistive device and the heat source. ? Leave the heat on for 20-30 minutes. ? Remove the heat if your skin turns bright red. This is especially important if you are unable to feel pain, heat, or cold. You may have a greater risk of getting burned.  If directed, put ice on the affected joint: ? If you have a removable assistive device, remove it as told by your health care provider. ? Put ice in a plastic bag. ? Place a towel between your skin and the bag. If your health care provider tells you to keep the assistive device on during icing, place a towel between the assistive device and the bag. ? Leave the ice on for 20 minutes, 2-3 times a day. General instructions  Take over-the-counter and prescription medicines only as told by your health care provider.  Maintain a healthy weight. Follow instructions from your health care provider for  weight control. These may include dietary restrictions.  Do not use any products that contain nicotine or tobacco, such as cigarettes and e-cigarettes. These can delay bone healing. If you need help quitting, ask your health care provider.  Use assistive devices as directed by your health care provider.  Keep all follow-up visits as told by your health care provider. This is important. Where to find more information  [employer] of Arthritis and Musculoskeletal and Skin Diseases: www.niams.[employer].es  [employer] on Aging: http://kim-miller.com/  American College of Rheumatology: www.rheumatology.org Contact a health care provider if:  Your skin turns red.  You develop a rash.  You have pain that gets worse.  You have a fever along with joint or muscle aches. Get help right away if:  You lose a lot of weight.  You suddenly lose your appetite.  You have night sweats. Summary  Osteoarthritis is a type of arthritis that affects tissue covering the ends of bones in joints (cartilage).  This condition is caused by age-related wearing down of cartilage that covers the ends of bones.  The main symptom of this condition is pain, swelling, and stiffness in the  joint.  There is no cure for this condition, but treatment can help to control pain and improve joint function. This information is not intended to replace advice given to you by your health care provider. Make sure you discuss any questions you have with your health care provider. Document Revised: 07/15/2017 Document Reviewed: 04/05/2016 Elsevier Patient Education  2020 [employer].

## 2020-06-07 ENCOUNTER — Other Ambulatory Visit: Payer: Self-pay | Admitting: Internal Medicine

## 2020-06-14 ENCOUNTER — Other Ambulatory Visit: Payer: Self-pay | Admitting: Internal Medicine

## 2020-06-24 ENCOUNTER — Other Ambulatory Visit: Payer: Self-pay | Admitting: Internal Medicine

## 2020-06-24 DIAGNOSIS — Z1231 Encounter for screening mammogram for malignant neoplasm of breast: Secondary | ICD-10-CM

## 2020-07-25 DIAGNOSIS — H10413 Chronic giant papillary conjunctivitis, bilateral: Secondary | ICD-10-CM | POA: Diagnosis not present

## 2020-07-25 DIAGNOSIS — H04123 Dry eye syndrome of bilateral lacrimal glands: Secondary | ICD-10-CM | POA: Diagnosis not present

## 2020-07-25 DIAGNOSIS — H40023 Open angle with borderline findings, high risk, bilateral: Secondary | ICD-10-CM | POA: Diagnosis not present

## 2020-07-25 DIAGNOSIS — H25813 Combined forms of age-related cataract, bilateral: Secondary | ICD-10-CM | POA: Diagnosis not present

## 2020-07-29 ENCOUNTER — Encounter: Payer: Self-pay | Admitting: Internal Medicine

## 2020-07-29 ENCOUNTER — Other Ambulatory Visit: Payer: Self-pay

## 2020-07-29 ENCOUNTER — Ambulatory Visit (INDEPENDENT_AMBULATORY_CARE_PROVIDER_SITE_OTHER): Payer: Medicare Other | Admitting: Internal Medicine

## 2020-07-29 VITALS — BP 130/82 | HR 95 | Temp 97.8°F | Wt 176.2 lb

## 2020-07-29 DIAGNOSIS — E119 Type 2 diabetes mellitus without complications: Secondary | ICD-10-CM | POA: Diagnosis not present

## 2020-07-29 DIAGNOSIS — E785 Hyperlipidemia, unspecified: Secondary | ICD-10-CM

## 2020-07-29 DIAGNOSIS — Z789 Other specified health status: Secondary | ICD-10-CM | POA: Diagnosis not present

## 2020-07-29 DIAGNOSIS — M2550 Pain in unspecified joint: Secondary | ICD-10-CM

## 2020-07-29 DIAGNOSIS — Z23 Encounter for immunization: Secondary | ICD-10-CM | POA: Diagnosis not present

## 2020-07-29 HISTORY — DX: Other specified health status: Z78.9

## 2020-07-29 LAB — COMPREHENSIVE METABOLIC PANEL
ALT: 39 U/L — ABNORMAL HIGH (ref 0–35)
AST: 27 U/L (ref 0–37)
Albumin: 4.5 g/dL (ref 3.5–5.2)
Alkaline Phosphatase: 102 U/L (ref 39–117)
BUN: 14 mg/dL (ref 6–23)
CO2: 30 mEq/L (ref 19–32)
Calcium: 10.1 mg/dL (ref 8.4–10.5)
Chloride: 101 mEq/L (ref 96–112)
Creatinine, Ser: 1.01 mg/dL (ref 0.40–1.20)
GFR: 55.16 mL/min — ABNORMAL LOW (ref >60.00)
Glucose, Bld: 141 mg/dL — ABNORMAL HIGH (ref 70–99)
Potassium: 3.7 mEq/L (ref 3.5–5.1)
Sodium: 138 mEq/L (ref 135–145)
Total Bilirubin: 0.5 mg/dL (ref 0.2–1.2)
Total Protein: 8 g/dL (ref 6.0–8.3)

## 2020-07-29 LAB — HEMOGLOBIN A1C: Hgb A1c MFr Bld: 6.8 % — ABNORMAL HIGH (ref 4.6–6.5)

## 2020-07-29 NOTE — Assessment & Plan Note (Signed)
  On diet  

## 2020-07-29 NOTE — Assessment & Plan Note (Signed)
Chronic. 

## 2020-07-29 NOTE — Progress Notes (Signed)
Subjective:  Patient ID: Monica Horn, female    DOB: Mar 23, 1946  Age: 74 y.o. MRN: [id number]  CC: Follow-up (3 MONTH F/U- FLU SHOT)   HPI Monica Horn presents for L foot pain, OA, HTN f/u  Outpatient Medications Prior to Visit  Medication Sig Dispense Refill  . cefdinir (OMNICEF) 300 MG capsule Take 1 capsule (300 mg total) by mouth 2 (two) times daily. 20 capsule 0  . celecoxib (CELEBREX) 200 MG capsule Take 1 capsule (200 mg total) by mouth daily as needed. 60 capsule 2  . cetirizine (ZYRTEC) 10 MG tablet Take 1 tablet (10 mg total) by mouth daily as needed. (Patient taking differently: Take 10 mg by mouth daily as needed for allergies.) 90 tablet 3  . cholecalciferol (VITAMIN D3) 10 MCG (400 UNIT) TABS tablet Take 1,000 Units by mouth daily.    . furosemide (LASIX) 20 MG tablet Take 1-2 tablets (20-40 mg total) by mouth daily. 60 tablet 3  . Multiple Vitamin (MULTIVITAMIN) tablet Take 1 tablet by mouth daily.    . pantoprazole (PROTONIX) 40 MG tablet TAKE 1 TABLET BY MOUTH  DAILY 90 tablet 3  . triamterene-hydrochlorothiazide (MAXZIDE-25) 37.5-25 MG tablet Take 1 tablet by mouth daily. 90 tablet 3  . trolamine salicylate (ASPERCREME) 10 % cream Apply 1 application topically 2 (two) times daily as needed for muscle pain.     No facility-administered medications prior to visit.    ROS: Review of Systems  Constitutional: Negative for activity change, appetite change, chills, fatigue and unexpected weight change.  HENT: Negative for congestion, mouth sores and sinus pressure.   Eyes: Negative for visual disturbance.  Respiratory: Negative for cough and chest tightness.   Gastrointestinal: Negative for abdominal pain and nausea.  Genitourinary: Negative for difficulty urinating, frequency and vaginal pain.  Musculoskeletal: Positive for arthralgias and gait problem. Negative for back pain.  Skin: Negative for pallor and rash.  Neurological: Negative for dizziness, tremors,  weakness, numbness and headaches.  Psychiatric/Behavioral: Negative for confusion, sleep disturbance and suicidal ideas.    Objective:  BP 130/82 (BP Location: Left Arm)   Pulse 95   Temp 97.8 F (36.6 C) (Oral)   Wt 176 lb 3.2 oz (79.9 kg)   SpO2 95%   BMI 33.02 kg/m   BP Readings from Last 3 Encounters:  07/29/20 130/82  05/30/20 136/86  04/24/20 122/80    Wt Readings from Last 3 Encounters:  07/29/20 176 lb 3.2 oz (79.9 kg)  05/30/20 176 lb 6.4 oz (80 kg)  04/24/20 177 lb (80.3 kg)    Physical Exam Constitutional:      General: She is not in acute distress.    Appearance: She is well-developed.  HENT:     Head: Normocephalic.     Right Ear: External ear normal.     Left Ear: External ear normal.     Nose: Nose normal.     Mouth/Throat:     Mouth: Oropharynx is clear and moist.  Eyes:     General:        Right eye: No discharge.        Left eye: No discharge.     Conjunctiva/sclera: Conjunctivae normal.     Pupils: Pupils are equal, round, and reactive to light.  Neck:     Thyroid: No thyromegaly.     Vascular: No JVD.     Trachea: No tracheal deviation.  Cardiovascular:     Rate and Rhythm: Normal rate and regular rhythm.  Heart sounds: Normal heart sounds.  Pulmonary:     Effort: No respiratory distress.     Breath sounds: No stridor. No wheezing.  Abdominal:     General: Bowel sounds are normal. There is no distension.     Palpations: Abdomen is soft. There is no mass.     Tenderness: There is no abdominal tenderness. There is no guarding or rebound.  Musculoskeletal:        General: Tenderness present. No edema.     Cervical back: Normal range of motion and neck supple.  Lymphadenopathy:     Cervical: No cervical adenopathy.  Skin:    Findings: No erythema or rash.  Neurological:     Cranial Nerves: No cranial nerve deficit.     Motor: No abnormal muscle tone.     Coordination: Coordination normal.     Deep Tendon Reflexes: Reflexes normal.   Psychiatric:        Mood and Affect: Mood and affect normal.        Behavior: Behavior normal.        Thought Content: Thought content normal.        Judgment: Judgment normal.    L ankle is swollen   Lab Results  Component Value Date   WBC 8.0 03/07/2019   HGB 13.2 03/07/2019   HCT 43.3 03/07/2019   PLT 319.0 03/07/2019   GLUCOSE 159 (H) 04/24/2020   CHOL 192 06/24/2017   TRIG 126.0 06/24/2017   HDL 37.00 (L) 06/24/2017   LDLDIRECT 120.0 05/13/2015   LDLCALC 130 (H) 06/24/2017   ALT 32 (H) 04/24/2020   AST 26 04/24/2020   NA 137 04/24/2020   K 4.0 04/24/2020   CL 98 04/24/2020   CREATININE 0.99 (H) 04/24/2020   BUN 15 04/24/2020   CO2 29 04/24/2020   TSH 2.06 06/24/2017   INR 0.9 10/13/2018   HGBA1C 6.9 (H) 04/24/2020   MICROALBUR 1.5 02/06/2016    MM 3D SCREEN BREAST BILATERAL  Result Date: 07/23/2019 CLINICAL DATA:  Screening. EXAM: DIGITAL SCREENING BILATERAL MAMMOGRAM WITH TOMO AND CAD COMPARISON:  Previous exam(s). ACR Breast Density Category b: There are scattered areas of fibroglandular density. FINDINGS: There are no findings suspicious for malignancy. Images were processed with CAD. IMPRESSION: No mammographic evidence of malignancy. A result letter of this screening mammogram will be mailed directly to the patient. RECOMMENDATION: Screening mammogram in one year. (Code:SM-B-01Y) BI-RADS CATEGORY  1: Negative. Electronically Signed   By: Lillia Mountain M.D.   On: 07/23/2019 13:35    Assessment & Plan:      Walker Kehr, MD

## 2020-07-29 NOTE — Assessment & Plan Note (Signed)
Labs

## 2020-08-04 ENCOUNTER — Other Ambulatory Visit: Payer: Self-pay

## 2020-08-04 ENCOUNTER — Ambulatory Visit
Admission: RE | Admit: 2020-08-04 | Discharge: 2020-08-04 | Disposition: A | Discharge status: Home / Self Care | Payer: Medicare Other | Source: Ambulatory Visit | Attending: Internal Medicine | Admitting: Internal Medicine

## 2020-08-04 DIAGNOSIS — Z1231 Encounter for screening mammogram for malignant neoplasm of breast: Secondary | ICD-10-CM | POA: Diagnosis not present

## 2020-08-14 DIAGNOSIS — Z03818 Encounter for observation for suspected exposure to other biological agents ruled out: Secondary | ICD-10-CM | POA: Diagnosis not present

## 2020-08-29 ENCOUNTER — Other Ambulatory Visit: Payer: Self-pay | Admitting: *Deleted

## 2020-08-29 DIAGNOSIS — M159 Polyosteoarthritis, unspecified: Secondary | ICD-10-CM

## 2020-08-29 DIAGNOSIS — M25472 Effusion, left ankle: Secondary | ICD-10-CM

## 2020-08-29 MED ORDER — TRIAMTERENE-HCTZ 37.5-25 MG PO TABS: 1.0000 | ORAL_TABLET | Freq: Every day | ORAL | 3 refills | Status: DC

## 2020-08-29 MED ORDER — FUROSEMIDE 20 MG PO TABS: 20.0000 mg | ORAL_TABLET | Freq: Every day | ORAL | 3 refills | Status: DC

## 2020-08-29 MED ORDER — CELECOXIB 200 MG PO CAPS: 200.0000 mg | ORAL_CAPSULE | Freq: Every day | ORAL | 3 refills | Status: DC | PRN

## 2020-08-29 MED ORDER — PANTOPRAZOLE SODIUM 40 MG PO TBEC: 40.0000 mg | DELAYED_RELEASE_TABLET | Freq: Every day | ORAL | 3 refills | Status: DC

## 2020-09-11 ENCOUNTER — Ambulatory Visit: Payer: Medicare Other | Admitting: Gastroenterology

## 2020-09-15 ENCOUNTER — Ambulatory Visit (INDEPENDENT_AMBULATORY_CARE_PROVIDER_SITE_OTHER): Payer: Medicare HMO | Admitting: Gastroenterology

## 2020-09-15 ENCOUNTER — Encounter: Payer: Self-pay | Admitting: Gastroenterology

## 2020-09-15 ENCOUNTER — Other Ambulatory Visit (INDEPENDENT_AMBULATORY_CARE_PROVIDER_SITE_OTHER): Payer: Medicare HMO

## 2020-09-15 VITALS — BP 124/72 | HR 64 | Ht 61.0 in | Wt 177.0 lb

## 2020-09-15 DIAGNOSIS — K219 Gastro-esophageal reflux disease without esophagitis: Secondary | ICD-10-CM

## 2020-09-15 DIAGNOSIS — D500 Iron deficiency anemia secondary to blood loss (chronic): Secondary | ICD-10-CM

## 2020-09-15 DIAGNOSIS — K921 Melena: Secondary | ICD-10-CM

## 2020-09-15 DIAGNOSIS — Z86010 Personal history of colonic polyps: Secondary | ICD-10-CM

## 2020-09-15 LAB — CBC WITH DIFFERENTIAL/PLATELET
Basophils Absolute: 0.1 10*3/uL (ref 0.0–0.1)
Basophils Relative: 1.4 % (ref 0.0–3.0)
Eosinophils Absolute: 0.2 10*3/uL (ref 0.0–0.7)
Eosinophils Relative: 2.5 % (ref 0.0–5.0)
HCT: 41.2 % (ref 36.0–46.0)
Hemoglobin: 13.2 g/dL (ref 12.0–15.0)
Lymphocytes Relative: 31 % (ref 12.0–46.0)
Lymphs Abs: 2 10*3/uL (ref 0.7–4.0)
MCHC: 31.9 g/dL (ref 30.0–36.0)
MCV: 75.5 fl — ABNORMAL LOW (ref 78.0–100.0)
Monocytes Absolute: 0.8 10*3/uL (ref 0.1–1.0)
Monocytes Relative: 12.5 % — ABNORMAL HIGH (ref 3.0–12.0)
Neutro Abs: 3.4 10*3/uL (ref 1.4–7.7)
Neutrophils Relative %: 52.6 % (ref 43.0–77.0)
Platelets: 283 10*3/uL (ref 150.0–400.0)
RBC: 5.46 Mil/uL — ABNORMAL HIGH (ref 3.87–5.11)
RDW: 14.2 % (ref 11.5–15.5)
WBC: 6.4 10*3/uL (ref 4.0–10.5)

## 2020-09-15 MED ORDER — PLENVU 140 G PO SOLR: 1.0000 | Freq: Once | ORAL | 0 refills | Status: AC

## 2020-09-15 NOTE — Patient Instructions (Signed)
Your provider has requested that you go to the basement level for lab work before leaving today. Press "B" on the elevator. The lab is located at the first door on the left as you exit the elevator.  You have been scheduled for a colonoscopy. Please follow written instructions given to you at your visit today.  Please pick up your prep supplies at the pharmacy within the next 1-3 days. If you use inhalers (even only as needed), please bring them with you on the day of your procedure.   Due to recent changes in healthcare laws, you may see the results of your imaging and laboratory studies on MyChart before your provider has had a chance to review them.  We understand that in some cases there may be results that are confusing or concerning to you. Not all laboratory results come back in the same time frame and the provider may be waiting for multiple results in order to interpret others.  Please give us 48 hours in order for your provider to thoroughly review all the results before contacting the office for clarification of your results.   Thank you for choosing me and [facility].  Malcolm T. Stark, Jr., MD., FACG  

## 2020-09-15 NOTE — Progress Notes (Signed)
    History of Present Illness: This is a 75 year old female with rectal bleeding and constipation.  She is accompanied by her husband.  She relates several days with constipation and larger, harder stools where she noted bright red blood per rectum.  Over the past few days she has not had any constipation or rectal bleeding.  No other gastrointestinal complaints.  Colonoscopy in October 2019 showed small internal hemorrhoids, 2 subcentimeter adenomatous colon polyps, prior tattoo at hepatic flexure, post polypectomy scar hepatic flexure and a tortuous colon. Denies weight loss, abdominal pain, diarrhea, change in stool caliber, melena, nausea, vomiting, dysphagia, reflux symptoms, chest pain.   Current Medications, Allergies, Past Medical History, Past Surgical History, Family History and Social History were reviewed in [employer] record.   Physical Exam: General: Well developed, well nourished, no acute distress Head: Normocephalic and atraumatic Eyes: Sclerae anicteric, EOMI Ears: Normal auditory acuity Mouth: Not examined, mask on during Covid-19 pandemic Lungs: Clear throughout to auscultation Heart: Regular rate and rhythm; no murmurs, rubs or bruits Abdomen: Soft, non tender and non distended. No masses, hepatosplenomegaly or hernias noted. Normal Bowel sounds Rectal: Deferred to colonoscopy  Musculoskeletal: Symmetrical with no gross deformities  Pulses:  Normal pulses noted Extremities: No clubbing, cyanosis, edema or deformities noted Neurological: Alert oriented x 4, grossly nonfocal Psychological:  Alert and cooperative. Normal mood and affect   Assessment and Recommendations:  1.  Small volume hematochezia, constipation, personal history of adenomatous colon polyps.  Suspected hemorrhoidal bleeding.  Rule out colorectal neoplasms.  Surveillance colonoscopy previously recommended in October 2022.  Increase dietary fiber and daily water intake.  Add a  stool softener or fiber supplement if constipation continues. Preparation H suppositories PR daily as needed hemorrhoidal symptoms.  Schedule colonoscopy. The risks (including bleeding, perforation, infection, missed lesions, medication reactions and possible hospitalization or surgery if complications occur), benefits, and alternatives to colonoscopy with possible biopsy and possible polypectomy were discussed with the patient and they consent to proceed.   2. History of H pylori gastritis. Treated in 2019 with Pylera.   3. GERD without esophagitis.  Follow antireflux measures continue pantoprazole 40 mg daily.  4. History of iron deficiency anemia felt secondary to a large benign gastric polyp which was removed in October 2019. CBC today.

## 2020-09-16 ENCOUNTER — Other Ambulatory Visit: Payer: Medicare HMO

## 2020-09-16 ENCOUNTER — Other Ambulatory Visit: Payer: Self-pay

## 2020-09-16 DIAGNOSIS — D509 Iron deficiency anemia, unspecified: Secondary | ICD-10-CM

## 2020-09-17 ENCOUNTER — Telehealth: Payer: Self-pay | Admitting: Gastroenterology

## 2020-09-17 LAB — IRON,TIBC AND FERRITIN PANEL
%SAT: 28 % (calc) (ref 16–45)
Ferritin: 123 ng/mL (ref 16–288)
Iron: 94 ug/dL (ref 45–160)
TIBC: 334 mcg/dL (calc) (ref 250–450)

## 2020-09-17 NOTE — Telephone Encounter (Signed)
See lab results for details.  

## 2020-09-17 NOTE — Telephone Encounter (Signed)
Pt is returning a missed call regarding her lab results. 

## 2020-10-01 DIAGNOSIS — Z6834 Body mass index (BMI) 34.0-34.9, adult: Secondary | ICD-10-CM | POA: Diagnosis not present

## 2020-10-01 DIAGNOSIS — Z01419 Encounter for gynecological examination (general) (routine) without abnormal findings: Secondary | ICD-10-CM | POA: Diagnosis not present

## 2020-10-14 ENCOUNTER — Encounter: Payer: Self-pay | Admitting: Internal Medicine

## 2020-10-14 ENCOUNTER — Other Ambulatory Visit: Payer: Self-pay

## 2020-10-14 ENCOUNTER — Ambulatory Visit (INDEPENDENT_AMBULATORY_CARE_PROVIDER_SITE_OTHER): Payer: Medicare HMO | Admitting: Internal Medicine

## 2020-10-14 DIAGNOSIS — M159 Polyosteoarthritis, unspecified: Secondary | ICD-10-CM

## 2020-10-14 DIAGNOSIS — E119 Type 2 diabetes mellitus without complications: Secondary | ICD-10-CM

## 2020-10-14 DIAGNOSIS — M25472 Effusion, left ankle: Secondary | ICD-10-CM

## 2020-10-14 DIAGNOSIS — I10 Essential (primary) hypertension: Secondary | ICD-10-CM

## 2020-10-14 MED ORDER — CELECOXIB 200 MG PO CAPS: 200.0000 mg | ORAL_CAPSULE | Freq: Every day | ORAL | 0 refills | Status: DC | PRN

## 2020-10-14 NOTE — Assessment & Plan Note (Signed)
Maxzide

## 2020-10-14 NOTE — Assessment & Plan Note (Signed)
  On diet  

## 2020-10-14 NOTE — Progress Notes (Signed)
Subjective:  Patient ID: Monica Horn, female    DOB: 1946/01/26  Age: 75 y.o. MRN: [id number]  CC: Follow-up (3 MONTH F/U)   HPI Monica Horn presents for hematochezia, GERD, DM f/u  Outpatient Medications Prior to Visit  Medication Sig Dispense Refill  . celecoxib (CELEBREX) 200 MG capsule Take 1 capsule (200 mg total) by mouth daily as needed. 180 capsule 3  . cetirizine (ZYRTEC) 10 MG tablet Take 1 tablet (10 mg total) by mouth daily as needed. (Patient taking differently: Take 10 mg by mouth daily as needed for allergies.) 90 tablet 3  . cholecalciferol (VITAMIN D3) 10 MCG (400 UNIT) TABS tablet Take 1,000 Units by mouth daily.    . furosemide (LASIX) 20 MG tablet Take 1-2 tablets (20-40 mg total) by mouth daily. 180 tablet 3  . Multiple Vitamin (MULTIVITAMIN) tablet Take 1 tablet by mouth daily.    . pantoprazole (PROTONIX) 40 MG tablet Take 1 tablet (40 mg total) by mouth daily. 90 tablet 3  . triamterene-hydrochlorothiazide (MAXZIDE-25) 37.5-25 MG tablet Take 1 tablet by mouth daily. 90 tablet 3  . trolamine salicylate (ASPERCREME) 10 % cream Apply 1 application topically 2 (two) times daily as needed for muscle pain.    . cefdinir (OMNICEF) 300 MG capsule Take 1 capsule (300 mg total) by mouth 2 (two) times daily. 20 capsule 0   No facility-administered medications prior to visit.    ROS: Review of Systems  Constitutional: Negative for activity change, appetite change, chills, fatigue and unexpected weight change.  HENT: Negative for congestion, mouth sores and sinus pressure.   Eyes: Negative for visual disturbance.  Respiratory: Negative for cough and chest tightness.   Gastrointestinal: Negative for abdominal pain and nausea.  Genitourinary: Negative for difficulty urinating, frequency and vaginal pain.  Musculoskeletal: Positive for back pain. Negative for gait problem.  Skin: Negative for pallor and rash.  Neurological: Negative for dizziness, tremors, weakness,  numbness and headaches.  Psychiatric/Behavioral: Negative for confusion, sleep disturbance and suicidal ideas.    Objective:  BP 130/84 (BP Location: Left Arm)   Pulse (!) 101   Temp 97.9 F (36.6 C) (Oral)   Ht 5\' 1"  (1.549 m)   Wt 177 lb 12.8 oz (80.6 kg)   SpO2 97%   BMI 33.60 kg/m   BP Readings from Last 3 Encounters:  10/14/20 130/84  09/15/20 124/72  07/29/20 130/82    Wt Readings from Last 3 Encounters:  10/14/20 177 lb 12.8 oz (80.6 kg)  09/15/20 177 lb (80.3 kg)  07/29/20 176 lb 3.2 oz (79.9 kg)    Physical Exam Constitutional:      General: She is not in acute distress.    Appearance: She is well-developed. She is obese.  HENT:     Head: Normocephalic.     Right Ear: External ear normal.     Left Ear: External ear normal.     Nose: Nose normal.     Mouth/Throat:     Mouth: Oropharynx is clear and moist.  Eyes:     General:        Right eye: No discharge.        Left eye: No discharge.     Conjunctiva/sclera: Conjunctivae normal.     Pupils: Pupils are equal, round, and reactive to light.  Neck:     Thyroid: No thyromegaly.     Vascular: No JVD.     Trachea: No tracheal deviation.  Cardiovascular:     Rate  and Rhythm: Normal rate and regular rhythm.     Heart sounds: Normal heart sounds.  Pulmonary:     Effort: No respiratory distress.     Breath sounds: No stridor. No wheezing.  Abdominal:     General: Bowel sounds are normal. There is no distension.     Palpations: Abdomen is soft. There is no mass.     Tenderness: There is no abdominal tenderness. There is no guarding or rebound.  Musculoskeletal:        General: Tenderness present. No edema.     Cervical back: Normal range of motion and neck supple.  Lymphadenopathy:     Cervical: No cervical adenopathy.  Skin:    Findings: No erythema or rash.  Neurological:     Mental Status: She is oriented to person, place, and time.     Cranial Nerves: No cranial nerve deficit.     Motor: No  abnormal muscle tone.     Coordination: Coordination normal.     Deep Tendon Reflexes: Reflexes normal.  Psychiatric:        Mood and Affect: Mood and affect normal.        Behavior: Behavior normal.        Thought Content: Thought content normal.        Judgment: Judgment normal.    OA changes - feet Swelling - dorsal L foot 3x3 cm  Lab Results  Component Value Date   WBC 6.4 09/15/2020   HGB 13.2 09/15/2020   HCT 41.2 09/15/2020   PLT 283.0 09/15/2020   GLUCOSE 141 (H) 07/29/2020   CHOL 192 06/24/2017   TRIG 126.0 06/24/2017   HDL 37.00 (L) 06/24/2017   LDLDIRECT 120.0 05/13/2015   LDLCALC 130 (H) 06/24/2017   ALT 39 (H) 07/29/2020   AST 27 07/29/2020   NA 138 07/29/2020   K 3.7 07/29/2020   CL 101 07/29/2020   CREATININE 1.01 07/29/2020   BUN 14 07/29/2020   CO2 30 07/29/2020   TSH 2.06 06/24/2017   INR 0.9 10/13/2018   HGBA1C 6.8 (H) 07/29/2020   MICROALBUR 1.5 02/06/2016    MM 3D SCREEN BREAST BILATERAL  Result Date: 08/06/2020 CLINICAL DATA:  Screening. EXAM: DIGITAL SCREENING BILATERAL MAMMOGRAM WITH TOMO AND CAD COMPARISON:  Previous exam(s). ACR Breast Density Category b: There are scattered areas of fibroglandular density. FINDINGS: There are no findings suspicious for malignancy. Images were processed with CAD. IMPRESSION: No mammographic evidence of malignancy. A result letter of this screening mammogram will be mailed directly to the patient. RECOMMENDATION: Screening mammogram in one year. (Code:SM-B-01Y) BI-RADS CATEGORY  1: Negative. Electronically Signed   By: Claudie Revering M.D.   On: 08/06/2020 16:36    Assessment & Plan:    Walker Kehr, MD

## 2020-10-24 ENCOUNTER — Encounter: Payer: Medicare HMO | Admitting: Gastroenterology

## 2020-11-04 ENCOUNTER — Encounter: Payer: Self-pay | Admitting: Gastroenterology

## 2020-11-04 ENCOUNTER — Other Ambulatory Visit: Payer: Self-pay

## 2020-11-04 ENCOUNTER — Ambulatory Visit (AMBULATORY_SURGERY_CENTER): Payer: Medicare HMO | Admitting: Gastroenterology

## 2020-11-04 VITALS — BP 135/90 | HR 75 | Temp 95.9°F | Resp 20 | Ht 61.0 in | Wt 177.0 lb

## 2020-11-04 DIAGNOSIS — K921 Melena: Secondary | ICD-10-CM | POA: Diagnosis not present

## 2020-11-04 DIAGNOSIS — D122 Benign neoplasm of ascending colon: Secondary | ICD-10-CM | POA: Diagnosis not present

## 2020-11-04 DIAGNOSIS — K640 First degree hemorrhoids: Secondary | ICD-10-CM

## 2020-11-04 DIAGNOSIS — D120 Benign neoplasm of cecum: Secondary | ICD-10-CM

## 2020-11-04 DIAGNOSIS — Z86010 Personal history of colonic polyps: Secondary | ICD-10-CM

## 2020-11-04 DIAGNOSIS — D125 Benign neoplasm of sigmoid colon: Secondary | ICD-10-CM

## 2020-11-04 DIAGNOSIS — Z1211 Encounter for screening for malignant neoplasm of colon: Secondary | ICD-10-CM | POA: Diagnosis not present

## 2020-11-04 MED ORDER — SODIUM CHLORIDE 0.9 % IV SOLN: 500.0000 mL | Freq: Once | INTRAVENOUS | Status: DC

## 2020-11-04 NOTE — Patient Instructions (Signed)
Handouts provided on polyps and hemorrhoids.   No aspirin, ibuprofen, naproxen, celebrex or other non-steriodal anti-inflammatory drugs for 2 weeks after polyp removal.    YOU HAD AN ENDOSCOPIC PROCEDURE TODAY AT [place]:   Refer to the procedure report that was given to you for any specific questions about what was found during the examination.  If the procedure report does not answer your questions, please call your gastroenterologist to clarify.  If you requested that your care partner not be given the details of your procedure findings, then the procedure report has been included in a sealed envelope for you to review at your convenience later.  YOU SHOULD EXPECT: Some feelings of bloating in the abdomen. Passage of more gas than usual.  Walking can help get rid of the air that was put into your GI tract during the procedure and reduce the bloating. If you had a lower endoscopy (such as a colonoscopy or flexible sigmoidoscopy) you may notice spotting of blood in your stool or on the toilet paper. If you underwent a bowel prep for your procedure, you may not have a normal bowel movement for a few days.  Please Note:  You might notice some irritation and congestion in your nose or some drainage.  This is from the oxygen used during your procedure.  There is no need for concern and it should clear up in a day or so.  SYMPTOMS TO REPORT IMMEDIATELY:   Following lower endoscopy (colonoscopy or flexible sigmoidoscopy):  Excessive amounts of blood in the stool  Significant tenderness or worsening of abdominal pains  Swelling of the abdomen that is new, acute  Fever of 100F or higher  For urgent or emergent issues, a gastroenterologist can be reached at any hour by calling [phone]. Do not use MyChart messaging for urgent concerns.    DIET:  We do recommend a small meal at first, but then you may proceed to your regular diet.  Drink plenty of fluids but you should  avoid alcoholic beverages for 24 hours.  ACTIVITY:  You should plan to take it easy for the rest of today and you should NOT DRIVE or use heavy machinery until tomorrow (because of the sedation medicines used during the test).    FOLLOW UP: Our staff will call the number listed on your records 48-72 hours following your procedure to check on you and address any questions or concerns that you may have regarding the information given to you following your procedure. If we do not reach you, we will leave a message.  We will attempt to reach you two times.  During this call, we will ask if you have developed any symptoms of COVID 19. If you develop any symptoms (ie: fever, flu-like symptoms, shortness of breath, cough etc.) before then, please call [phone].  If you test positive for Covid 19 in the 2 weeks post procedure, please call and report this information to [place].    If any biopsies were taken you will be contacted by phone or by letter within the next 1-3 weeks.  Please call us at [phone] if you have not heard about the biopsies in 3 weeks.    SIGNATURES/CONFIDENTIALITY: You and/or your care partner have signed paperwork which will be entered into your electronic medical record.  These signatures attest to the fact that that the information above on your After Visit Summary has been reviewed and is understood.  Full responsibility of the confidentiality of this  discharge information lies with you and/or your care-partner.

## 2020-11-04 NOTE — Progress Notes (Signed)
To Pacu, VSS. Report to [email or web address] 

## 2020-11-04 NOTE — Op Note (Signed)
[facility] Patient Name: Monica Horn Procedure Date: 11/04/2020 3:42 PM MRN: [id number] Endoscopist: Ladene Artist , MD Age: 75 Referring MD:  Date of Birth: 1946-05-20 Gender: Female Account #: [id number] Procedure:                Colonoscopy Indications:              Hematochezia. Personal history of adenomatous colon                            polyps, including a TVA with HGD in 2008 Medicines:                Monitored Anesthesia Care Procedure:                Pre-Anesthesia Assessment:                           - Prior to the procedure, a History and Physical                            was performed, and patient medications and                            allergies were reviewed. The patient's tolerance of                            previous anesthesia was also reviewed. The risks                            and benefits of the procedure and the sedation                            options and risks were discussed with the patient.                            All questions were answered, and informed consent                            was obtained. Prior Anticoagulants: The patient has                            taken no previous anticoagulant or antiplatelet                            agents. ASA Grade Assessment: III - A patient with                            severe systemic disease. After reviewing the risks                            and benefits, the patient was deemed in                            satisfactory condition to undergo the procedure.  After obtaining informed consent, the colonoscope                            was passed under direct vision. Throughout the                            procedure, the patient's blood pressure, pulse, and                            oxygen saturations were monitored continuously. The                            Olympus [id number]-[id number] (#[id number]) Colonoscope was                            introduced through  the anus and advanced to the the                            cecum, identified by appendiceal orifice and                            ileocecal valve. The ileocecal valve, appendiceal                            orifice, and rectum were photographed. The quality                            of the bowel preparation was adequate. The                            colonoscopy was performed without difficulty. The                            patient tolerated the procedure well. Scope In: 3:49:56 PM Scope Out: 4:17:16 PM Scope Withdrawal Time: 0 hours 20 minutes 7 seconds  Total Procedure Duration: 0 hours 27 minutes 20 seconds  Findings:                 The perianal and digital rectal examinations were                            normal.                           Three sessile polyps were found in the sigmoid                            colon (1) and cecum (2). The polyps were 7 to 8 mm                            in size. These polyps were removed with a cold                            snare. Resection and retrieval were complete. One  cecal polypectomy site had oozing and a hematoma                            developed. Oozing stopped spontaneously. Hematoma                            stabilized once oozing stopped.                           A 3 mm polyp was found in the ascending colon. The                            polyp was sessile. The polyp was removed with a                            cold biopsy forceps. Resection and retrieval were                            complete.                           A tattoo was seen at the hepatic flexure. A                            post-polypectomy scar was found at the tattoo site.                            There was no evidence of residual polyp tissue.                           Internal hemorrhoids were found during                            retroflexion. The hemorrhoids were small and Grade                            I  (internal hemorrhoids that do not prolapse).                           The exam was otherwise without abnormality on                            direct and retroflexion views. Complications:            No immediate complications. Estimated blood loss:                            None. Estimated Blood Loss:     Estimated blood loss: none. Impression:               - Three 7 to 8 mm polyps in the sigmoid colon and                            in the cecum, removed with a cold snare. Resected  and retrieved.                           - One 3 mm polyp in the ascending colon, removed                            with a cold biopsy forceps. Resected and retrieved.                           - A tattoo was seen at the hepatic flexure. A                            post-polypectomy scar was found at the tattoo site.                            There was no evidence of residual polyp tissue.                           - Internal hemorrhoids.                           - The examination was otherwise normal on direct                            and retroflexion views. Recommendation:           - Repeat colonoscopy after studies are complete for                            surveillance based on pathology results.                           - Patient has a contact number available for                            emergencies. The signs and symptoms of potential                            delayed complications were discussed with the                            patient. Return to normal activities tomorrow.                            Written discharge instructions were provided to the                            patient.                           - Resume previous diet.                           - Continue present medications.                           -  Await pathology results.                           - No aspirin, ibuprofen, naproxen, celebrex or                            other  non-steroidal anti-inflammatory drugs for 2                            weeks after polyp removal. Ladene Artist, MD 11/04/2020 4:31:35 PM This report has been signed electronically.

## 2020-11-06 ENCOUNTER — Telehealth: Payer: Self-pay

## 2020-11-06 NOTE — Telephone Encounter (Signed)
  Follow up Call-  Call back number 11/04/2020 06/06/2018  Post procedure Call Back phone  # [phone]  Permission to leave phone message Yes Yes  Some recent data might be hidden     Patient questions:  Do you have a fever, pain , or abdominal swelling? No. Pain Score  0 *  Have you tolerated food without any problems? Yes.    Have you been able to return to your normal activities? Yes.    Do you have any questions about your discharge instructions: Diet   No. Medications  No. Follow up visit  No.  Do you have questions or concerns about your Care? No.  Actions: * If pain score is 4 or above: No action needed, pain <4. 1. Have you developed a fever since your procedure? no  2.   Have you had an respiratory symptoms (SOB or cough) since your procedure? no  3.   Have you tested positive for COVID 19 since your procedure no  4.   Have you had any family members/close contacts diagnosed with the COVID 19 since your procedure?  no   If yes to any of these questions please route to Joylene John, RN and Joella Prince, RN

## 2020-11-17 ENCOUNTER — Other Ambulatory Visit: Payer: Self-pay

## 2020-11-18 ENCOUNTER — Encounter: Payer: Self-pay | Admitting: Gastroenterology

## 2020-11-18 ENCOUNTER — Ambulatory Visit: Payer: Medicare HMO | Admitting: Internal Medicine

## 2020-11-18 ENCOUNTER — Encounter: Payer: Self-pay | Admitting: Internal Medicine

## 2020-11-18 DIAGNOSIS — G8929 Other chronic pain: Secondary | ICD-10-CM

## 2020-11-18 DIAGNOSIS — M25561 Pain in right knee: Secondary | ICD-10-CM | POA: Diagnosis not present

## 2020-11-18 NOTE — Assessment & Plan Note (Signed)
Worse Ortho ref Voltaren gel  Re-start Celebrex when allowed

## 2020-11-18 NOTE — Progress Notes (Signed)
Subjective:  Patient ID: Monica Horn, female    DOB: December 09, 1945  Age: 75 y.o. MRN: [id number]  CC: Knee Pain ((R) knee pain and swelling)   HPI Monica Horn presents for R knee pain and swelling - worse at night, limping - a few weeks  Outpatient Medications Prior to Visit  Medication Sig Dispense Refill  . celecoxib (CELEBREX) 200 MG capsule Take 1 capsule (200 mg total) by mouth daily as needed for moderate pain. 180 capsule 0  . cetirizine (ZYRTEC) 10 MG tablet Take 1 tablet (10 mg total) by mouth daily as needed. (Patient taking differently: Take 10 mg by mouth daily as needed for allergies.) 90 tablet 3  . cholecalciferol (VITAMIN D3) 10 MCG (400 UNIT) TABS tablet Take 1,000 Units by mouth daily.    . furosemide (LASIX) 20 MG tablet Take 1-2 tablets (20-40 mg total) by mouth daily. 180 tablet 3  . Multiple Vitamin (MULTIVITAMIN) tablet Take 1 tablet by mouth daily.    . pantoprazole (PROTONIX) 40 MG tablet Take 1 tablet (40 mg total) by mouth daily. 90 tablet 3  . triamterene-hydrochlorothiazide (MAXZIDE-25) 37.5-25 MG tablet Take 1 tablet by mouth daily. 90 tablet 3  . trolamine salicylate (ASPERCREME) 10 % cream Apply 1 application topically 2 (two) times daily as needed for muscle pain.     No facility-administered medications prior to visit.    ROS: Review of Systems  Musculoskeletal: Positive for arthralgias and gait problem.    Objective:  BP 140/82 (BP Location: Left Arm)   Pulse 96   Temp 98.2 F (36.8 C) (Oral)   SpO2 97%   BP Readings from Last 3 Encounters:  11/18/20 140/82  11/04/20 135/90  10/14/20 130/84    Wt Readings from Last 3 Encounters:  11/04/20 177 lb (80.3 kg)  10/14/20 177 lb 12.8 oz (80.6 kg)  09/15/20 177 lb (80.3 kg)    Physical Exam Constitutional:      General: She is not in acute distress.    Appearance: She is well-developed.  HENT:     Head: Normocephalic.     Right Ear: External ear normal.     Left Ear: External ear  normal.     Nose: Nose normal.  Eyes:     General:        Right eye: No discharge.        Left eye: No discharge.     Conjunctiva/sclera: Conjunctivae normal.     Pupils: Pupils are equal, round, and reactive to light.  Neck:     Thyroid: No thyromegaly.     Vascular: No JVD.     Trachea: No tracheal deviation.  Cardiovascular:     Rate and Rhythm: Normal rate and regular rhythm.     Heart sounds: Normal heart sounds.  Pulmonary:     Effort: No respiratory distress.     Breath sounds: No stridor. No wheezing.  Abdominal:     General: Bowel sounds are normal. There is no distension.     Palpations: Abdomen is soft. There is no mass.     Tenderness: There is no abdominal tenderness. There is no guarding or rebound.  Musculoskeletal:        General: No tenderness.     Cervical back: Normal range of motion and neck supple.  Lymphadenopathy:     Cervical: No cervical adenopathy.  Skin:    Findings: No erythema or rash.  Neurological:     Cranial Nerves: No cranial nerve  deficit.     Motor: No abnormal muscle tone.     Coordination: Coordination normal.     Deep Tendon Reflexes: Reflexes normal.  Psychiatric:        Behavior: Behavior normal.        Thought Content: Thought content normal.        Judgment: Judgment normal.   R kee - pain w/ROM, medial pain, no effusion ACE wrap  Lab Results  Component Value Date   WBC 6.4 09/15/2020   HGB 13.2 09/15/2020   HCT 41.2 09/15/2020   PLT 283.0 09/15/2020   GLUCOSE 141 (H) 07/29/2020   CHOL 192 06/24/2017   TRIG 126.0 06/24/2017   HDL 37.00 (L) 06/24/2017   LDLDIRECT 120.0 05/13/2015   LDLCALC 130 (H) 06/24/2017   ALT 39 (H) 07/29/2020   AST 27 07/29/2020   NA 138 07/29/2020   K 3.7 07/29/2020   CL 101 07/29/2020   CREATININE 1.01 07/29/2020   BUN 14 07/29/2020   CO2 30 07/29/2020   TSH 2.06 06/24/2017   INR 0.9 10/13/2018   HGBA1C 6.8 (H) 07/29/2020   MICROALBUR 1.5 02/06/2016    MM 3D SCREEN BREAST  BILATERAL  Result Date: 08/06/2020 CLINICAL DATA:  Screening. EXAM: DIGITAL SCREENING BILATERAL MAMMOGRAM WITH TOMO AND CAD COMPARISON:  Previous exam(s). ACR Breast Density Category b: There are scattered areas of fibroglandular density. FINDINGS: There are no findings suspicious for malignancy. Images were processed with CAD. IMPRESSION: No mammographic evidence of malignancy. A result letter of this screening mammogram will be mailed directly to the patient. RECOMMENDATION: Screening mammogram in one year. (Code:SM-B-01Y) BI-RADS CATEGORY  1: Negative. Electronically Signed   By: Claudie Revering M.D.   On: 08/06/2020 16:36    Assessment & Plan:   There are no diagnoses linked to this encounter.   No orders of the defined types were placed in this encounter.    Follow-up: No follow-ups on file.  Walker Kehr, MD

## 2020-11-18 NOTE — Patient Instructions (Signed)
Knee brace Voltaren gel  Re-start Celebrex when allowed

## 2020-12-26 DIAGNOSIS — M25561 Pain in right knee: Secondary | ICD-10-CM | POA: Diagnosis not present

## 2021-01-13 ENCOUNTER — Ambulatory Visit (INDEPENDENT_AMBULATORY_CARE_PROVIDER_SITE_OTHER): Payer: Medicare HMO | Admitting: Internal Medicine

## 2021-01-13 ENCOUNTER — Telehealth: Payer: Self-pay | Admitting: Internal Medicine

## 2021-01-13 ENCOUNTER — Encounter: Payer: Self-pay | Admitting: Internal Medicine

## 2021-01-13 ENCOUNTER — Other Ambulatory Visit: Payer: Self-pay

## 2021-01-13 DIAGNOSIS — G8929 Other chronic pain: Secondary | ICD-10-CM

## 2021-01-13 DIAGNOSIS — M25561 Pain in right knee: Secondary | ICD-10-CM | POA: Diagnosis not present

## 2021-01-13 DIAGNOSIS — R7309 Other abnormal glucose: Secondary | ICD-10-CM

## 2021-01-13 DIAGNOSIS — E119 Type 2 diabetes mellitus without complications: Secondary | ICD-10-CM

## 2021-01-13 DIAGNOSIS — M7712 Lateral epicondylitis, left elbow: Secondary | ICD-10-CM

## 2021-01-13 DIAGNOSIS — I10 Essential (primary) hypertension: Secondary | ICD-10-CM

## 2021-01-13 DIAGNOSIS — M7710 Lateral epicondylitis, unspecified elbow: Secondary | ICD-10-CM

## 2021-01-13 HISTORY — DX: Lateral epicondylitis, unspecified elbow: M77.10

## 2021-01-13 LAB — HEMOGLOBIN A1C: Hgb A1c MFr Bld: 6.7 % — ABNORMAL HIGH (ref 4.6–6.5)

## 2021-01-13 LAB — COMPREHENSIVE METABOLIC PANEL
ALT: 43 U/L — ABNORMAL HIGH (ref 0–35)
AST: 29 U/L (ref 0–37)
Albumin: 4.4 g/dL (ref 3.5–5.2)
Alkaline Phosphatase: 108 U/L (ref 39–117)
BUN: 19 mg/dL (ref 6–23)
CO2: 28 mEq/L (ref 19–32)
Calcium: 10.2 mg/dL (ref 8.4–10.5)
Chloride: 104 mEq/L (ref 96–112)
Creatinine, Ser: 0.96 mg/dL (ref 0.40–1.20)
GFR: 58.43 mL/min — ABNORMAL LOW (ref >60.00)
Glucose, Bld: 82 mg/dL (ref 70–99)
Potassium: 3.6 mEq/L (ref 3.5–5.1)
Sodium: 140 mEq/L (ref 135–145)
Total Bilirubin: 0.5 mg/dL (ref 0.2–1.2)
Total Protein: 7.7 g/dL (ref 6.0–8.3)

## 2021-01-13 NOTE — Telephone Encounter (Signed)
LVM for pt to rtn my call to schedule AWV with [facility]. Please schedule AWV if pt calls the office  

## 2021-01-13 NOTE — Assessment & Plan Note (Signed)
Better  

## 2021-01-13 NOTE — Assessment & Plan Note (Signed)
On diet Check A1c 

## 2021-01-13 NOTE — Patient Instructions (Addendum)
Tennis Elbow  Tennis elbow is irritation and swelling (inflammation) in your outer forearm, near your elbow. Swelling affects the tissues that connect muscle to bone (tendons). Tennis elbow can happen playing any sport or doing any job where you use your elbow too much. It is caused by doing the same motion over and over. What are the causes? This condition is often caused by playing sports or doing work where you need to keep moving your forearm the same way. Sometimes, it may be caused by a sudden injury. What increases the risk? You are more likely to get tennis elbow if you play tennis or another racket sport. You also have a higher risk if you often use your hands for work. This includes:  People who use computers.  Architect workers.  People who work in a factory.  Musicians.  Cooks.  Cashiers. What are the signs or symptoms?  Pain and tenderness in your forearm and the outer part of your elbow. You may have pain all the time or only when you use your arm.  A burning feeling. This starts in your elbow and spreads down your arm.  A weak grip in your hand. How is this treated? Resting and icing your arm is often the first treatment. Your doctor may also recommend:  Medicines to reduce pain and swelling.  An elbow strap.  Physical therapy. This may include massage or exercises or both.  An elbow brace. If these do not help your symptoms get better, your doctor may recommend surgery. Follow these instructions at home: If you have a brace or strap:  Wear the brace or strap as told by your doctor. Take it off only as told by your doctor.  Check the skin around the brace or strap every day. Tell your doctor if you see problems.  Loosen it if your fingers: ? Tingle. ? Become numb. ? Turn cold and blue.  Keep the brace or strap clean.  If the brace or strap is not waterproof: ? Do not let it get wet. ? Cover it with a watertight covering when you take a bath or a  shower. Managing pain, stiffness, and swelling  If told, put ice on the injured area. To do this: ? If you have a removable brace or strap, take it off as told by your doctor. ? Put ice in a plastic bag. ? Place a towel between your skin and the bag. ? Leave the ice on for 20 minutes, 2-3 times a day. ? Take off the ice if your skin turns bright red. This is very important. If you cannot feel pain, heat, or cold, you have a greater risk of damage to the area.  Move your fingers often.   Activity  Rest your elbow and wrist. Avoid activities that can cause elbow problems as told by your doctor.  Do exercises as told by your doctor.  If you lift an object, lift it with your palm facing up. Lifestyle  If your tennis elbow is caused by sports, check your equipment and make sure that: ? You are using it the right way. ? It fits you well.  If your tennis elbow is caused by work or computer use, take breaks often to stretch your arm. Talk with your manager about how you can make your condition better at work. General instructions  Take over-the-counter and prescription medicines only as told by your doctor.  Do not smoke or use any products that contain nicotine or tobacco.  If you need help quitting, ask your doctor.  Keep all follow-up visits. How is this prevented?  Before and after being active: ? Warm up and stretch before being active. ? Cool down and stretch after being active. ? Give your body time to rest between activities.  While being active: ? Make sure to use equipment that fits you. ? If you play tennis, put power in your stroke with your lower body. Avoid using your arm only.  Maintain physical fitness. This includes: ? Strength. ? Flexibility. ? Endurance.  Do exercises to strengthen the forearm muscles. Contact a doctor if:  Your pain does not get better with treatment.  Your pain gets worse.  You have weakness in your forearm, hand, or fingers.  You  cannot feel your forearm, hand, or fingers. Get help right away if:  Your pain is very bad.  You cannot move your wrist. Summary  Tennis elbow is irritation and swelling (inflammation) in your outer forearm, near your elbow.  Tennis elbow is caused by doing the same motion over and over.  Rest your elbow and wrist. Avoid activities as told by your doctor.  If told, put ice on the injured area for 20 minutes, 2-3 times a day. This information is not intended to replace advice given to you by your health care provider. Make sure you discuss any questions you have with your health care provider. Document Revised: 02/12/2020 Document Reviewed: 02/12/2020 Elsevier Patient Education  2021 [place], Voltaren gel, massage

## 2021-01-13 NOTE — Assessment & Plan Note (Signed)
A1c

## 2021-01-13 NOTE — Progress Notes (Signed)
Subjective:  Patient ID: Monica Horn, female    DOB: Jun 05, 1946  Age: 76 y.o. MRN: [id number]  CC: Follow-up (3 month f/u)   HPI Monica Horn presents for L elbow pain, OA, HTN, edema  Outpatient Medications Prior to Visit  Medication Sig Dispense Refill   celecoxib (CELEBREX) 200 MG capsule Take 1 capsule (200 mg total) by mouth daily as needed for moderate pain. 180 capsule 0   cetirizine (ZYRTEC) 10 MG tablet Take 1 tablet (10 mg total) by mouth daily as needed. (Patient taking differently: Take 10 mg by mouth daily as needed for allergies.) 90 tablet 3   cholecalciferol (VITAMIN D3) 10 MCG (400 UNIT) TABS tablet Take 1,000 Units by mouth daily.     furosemide (LASIX) 20 MG tablet Take 1-2 tablets (20-40 mg total) by mouth daily. 180 tablet 3   Multiple Vitamin (MULTIVITAMIN) tablet Take 1 tablet by mouth daily.     pantoprazole (PROTONIX) 40 MG tablet Take 1 tablet (40 mg total) by mouth daily. 90 tablet 3   triamterene-hydrochlorothiazide (MAXZIDE-25) 37.5-25 MG tablet Take 1 tablet by mouth daily. 90 tablet 3   trolamine salicylate (ASPERCREME) 10 % cream Apply 1 application topically 2 (two) times daily as needed for muscle pain.     No facility-administered medications prior to visit.    ROS: Review of Systems  Constitutional: Negative for activity change, appetite change, chills, fatigue and unexpected weight change.  HENT: Negative for congestion, mouth sores and sinus pressure.   Eyes: Negative for visual disturbance.  Respiratory: Negative for cough and chest tightness.   Gastrointestinal: Negative for abdominal pain and nausea.  Genitourinary: Negative for difficulty urinating, frequency and vaginal pain.  Musculoskeletal: Negative for back pain and gait problem.  Skin: Negative for pallor and rash.  Neurological: Negative for dizziness, tremors, weakness, numbness and headaches.  Psychiatric/Behavioral: Negative for confusion and sleep disturbance.     Objective:  BP 130/72 (BP Location: Left Arm)   Pulse 95   Temp 98.4 F (36.9 C) (Oral)   Ht 5\' 1"  (1.549 m)   Wt 167 lb (75.8 kg)   SpO2 95%   BMI 31.55 kg/m   BP Readings from Last 3 Encounters:  01/13/21 130/72  11/18/20 140/82  11/04/20 135/90    Wt Readings from Last 3 Encounters:  01/13/21 167 lb (75.8 kg)  11/04/20 177 lb (80.3 kg)  10/14/20 177 lb 12.8 oz (80.6 kg)    Physical Exam Constitutional:      General: She is not in acute distress.    Appearance: She is well-developed. She is obese.  HENT:     Head: Normocephalic.     Right Ear: External ear normal.     Left Ear: External ear normal.     Nose: Nose normal.  Eyes:     General:        Right eye: No discharge.        Left eye: No discharge.     Conjunctiva/sclera: Conjunctivae normal.     Pupils: Pupils are equal, round, and reactive to light.  Neck:     Thyroid: No thyromegaly.     Vascular: No JVD.     Trachea: No tracheal deviation.  Cardiovascular:     Rate and Rhythm: Normal rate and regular rhythm.     Heart sounds: Normal heart sounds.  Pulmonary:     Effort: No respiratory distress.     Breath sounds: No stridor. No wheezing.  Abdominal:  General: Bowel sounds are normal. There is no distension.     Palpations: Abdomen is soft. There is no mass.     Tenderness: There is no abdominal tenderness. There is no guarding or rebound.  Musculoskeletal:        General: No tenderness.     Cervical back: Normal range of motion and neck supple.  Lymphadenopathy:     Cervical: No cervical adenopathy.  Skin:    Findings: No erythema or rash.  Neurological:     Cranial Nerves: No cranial nerve deficit.     Motor: No abnormal muscle tone.     Coordination: Coordination normal.     Deep Tendon Reflexes: Reflexes normal.  Psychiatric:        Behavior: Behavior normal.        Thought Content: Thought content normal.        Judgment: Judgment normal.     Lab Results  Component Value  Date   WBC 6.4 09/15/2020   HGB 13.2 09/15/2020   HCT 41.2 09/15/2020   PLT 283.0 09/15/2020   GLUCOSE 141 (H) 07/29/2020   CHOL 192 06/24/2017   TRIG 126.0 06/24/2017   HDL 37.00 (L) 06/24/2017   LDLDIRECT 120.0 05/13/2015   LDLCALC 130 (H) 06/24/2017   ALT 39 (H) 07/29/2020   AST 27 07/29/2020   NA 138 07/29/2020   K 3.7 07/29/2020   CL 101 07/29/2020   CREATININE 1.01 07/29/2020   BUN 14 07/29/2020   CO2 30 07/29/2020   TSH 2.06 06/24/2017   INR 0.9 10/13/2018   HGBA1C 6.8 (H) 07/29/2020   MICROALBUR 1.5 02/06/2016    MM 3D SCREEN BREAST BILATERAL  Result Date: 08/06/2020 CLINICAL DATA:  Screening. EXAM: DIGITAL SCREENING BILATERAL MAMMOGRAM WITH TOMO AND CAD COMPARISON:  Previous exam(s). ACR Breast Density Category b: There are scattered areas of fibroglandular density. FINDINGS: There are no findings suspicious for malignancy. Images were processed with CAD. IMPRESSION: No mammographic evidence of malignancy. A result letter of this screening mammogram will be mailed directly to the patient. RECOMMENDATION: Screening mammogram in one year. (Code:SM-B-01Y) BI-RADS CATEGORY  1: Negative. Electronically Signed   By: Claudie Revering M.D.   On: 08/06/2020 16:36    Assessment & Plan:    Walker Kehr, MD

## 2021-01-13 NOTE — Assessment & Plan Note (Signed)
On Maxzide

## 2021-01-13 NOTE — Assessment & Plan Note (Addendum)
L side. [place], Voltaren gel, massage

## 2021-01-21 DIAGNOSIS — H524 Presbyopia: Secondary | ICD-10-CM | POA: Diagnosis not present

## 2021-01-23 ENCOUNTER — Other Ambulatory Visit: Payer: Self-pay

## 2021-01-23 ENCOUNTER — Ambulatory Visit (INDEPENDENT_AMBULATORY_CARE_PROVIDER_SITE_OTHER): Payer: Medicare HMO

## 2021-01-23 VITALS — BP 118/80 | HR 93 | Temp 97.6°F | Resp 16 | Ht 61.0 in | Wt 170.2 lb

## 2021-01-23 DIAGNOSIS — Z0000 Encounter for general adult medical examination without abnormal findings: Secondary | ICD-10-CM | POA: Diagnosis not present

## 2021-01-23 DIAGNOSIS — H40023 Open angle with borderline findings, high risk, bilateral: Secondary | ICD-10-CM | POA: Diagnosis not present

## 2021-01-23 NOTE — Patient Instructions (Signed)
Monica Horn , Thank you for taking time to come for your Medicare Wellness Visit. I appreciate your ongoing commitment to your health goals. Please review the following plan we discussed and let me know if I can assist you in the future.   Screening recommendations/referrals: Colonoscopy: 11/04/2020; due every 3 years Mammogram: 08/04/2020 Bone Density: 10/07/2017; normal Recommended yearly ophthalmology/optometry visit for glaucoma screening and checkup Recommended yearly dental visit for hygiene and checkup  Vaccinations: Influenza vaccine: 07/29/2020 Pneumococcal vaccine: 06/27/2015, 10/02/2015 Tdap vaccine: 01/31/2009; due every 10 years (overdue) Shingles vaccine: never done   Covid-19: 10/09/2019, 10/30/2019, 06/05/2020  Advanced directives: Advance directive discussed with you today. Even though you declined this today please call our office should you change your mind and we can give you the proper paperwork for you to fill out.  Conditions/risks identified: Yes.  Recommend to drink at least 6-8 8oz glasses of water per day.   Recommend to exercise for at least 150 minutes per week.   Recommend to remove any items from the home that may cause slips or trips.   Recommend to decrease portion sizes by eating 3 small healthy meals and at least 2 healthy snacks per day.   Recommend to begin DASH diet as directed below  Next appointment: Please schedule your next Medicare Wellness Visit with your Nurse Health Advisor in 1 year by calling [phone].   Preventive Care 70 Years and Older, Female Preventive care refers to lifestyle choices and visits with your health care provider that can promote health and wellness. What does preventive care include? A yearly physical exam. This is also called an annual well check. Dental exams once or twice a year. Routine eye exams. Ask your health care provider how often you should have your eyes checked. Personal lifestyle choices,  including: Daily care of your teeth and gums. Regular physical activity. Eating a healthy diet. Avoiding tobacco and drug use. Limiting alcohol use. Practicing safe sex. Taking low-dose aspirin every day. Taking vitamin and mineral supplements as recommended by your health care provider. What happens during an annual well check? The services and screenings done by your health care provider during your annual well check will depend on your age, overall health, lifestyle risk factors, and family history of disease. Counseling  Your health care provider may ask you questions about your: Alcohol use. Tobacco use. Drug use. Emotional well-being. Home and relationship well-being. Sexual activity. Eating habits. History of falls. Memory and ability to understand (cognition). Work and work Statistician. Reproductive health. Screening  You may have the following tests or measurements: Height, weight, and BMI. Blood pressure. Lipid and cholesterol levels. These may be checked every 5 years, or more frequently if you are over 79 years old. Skin check. Lung cancer screening. You may have this screening every year starting at age 45 if you have a 30-pack-year history of smoking and currently smoke or have quit within the past 15 years. Fecal occult blood test (FOBT) of the stool. You may have this test every year starting at age 38. Flexible sigmoidoscopy or colonoscopy. You may have a sigmoidoscopy every 5 years or a colonoscopy every 10 years starting at age 36. Hepatitis C blood test. Hepatitis B blood test. Sexually transmitted disease (STD) testing. Diabetes screening. This is done by checking your blood sugar (glucose) after you have not eaten for a while (fasting). You may have this done every 1-3 years. Bone density scan. This is done to screen for osteoporosis. You may have this  done starting at age 71. Mammogram. This may be done every 1-2 years. Talk to your health care provider  about how often you should have regular mammograms. Talk with your health care provider about your test results, treatment options, and if necessary, the need for more tests. Vaccines  Your health care provider may recommend certain vaccines, such as: Influenza vaccine. This is recommended every year. Tetanus, diphtheria, and acellular pertussis (Tdap, Td) vaccine. You may need a Td booster every 10 years. Zoster vaccine. You may need this after age 35. Pneumococcal 13-valent conjugate (PCV13) vaccine. One dose is recommended after age 28. Pneumococcal polysaccharide (PPSV23) vaccine. One dose is recommended after age 30. Talk to your health care provider about which screenings and vaccines you need and how often you need them. This information is not intended to replace advice given to you by your health care provider. Make sure you discuss any questions you have with your health care provider. Document Released: 08/29/2015 Document Revised: 04/21/2016 Document Reviewed: 06/03/2015 Elsevier Interactive Patient Education  2017 [place] Prevention in the Home Falls can cause injuries. They can happen to people of all ages. There are many things you can do to make your home safe and to help prevent falls. What can I do on the outside of my home? Regularly fix the edges of walkways and driveways and fix any cracks. Remove anything that might make you trip as you walk through a door, such as a raised step or threshold. Trim any bushes or trees on the path to your home. Use bright outdoor lighting. Clear any walking paths of anything that might make someone trip, such as rocks or tools. Regularly check to see if handrails are loose or broken. Make sure that both sides of any steps have handrails. Any raised decks and porches should have guardrails on the edges. Have any leaves, snow, or ice cleared regularly. Use sand or salt on walking paths during winter. Clean up any spills in  your garage right away. This includes oil or grease spills. What can I do in the bathroom? Use night lights. Install grab bars by the toilet and in the tub and shower. Do not use towel bars as grab bars. Use non-skid mats or decals in the tub or shower. If you need to sit down in the shower, use a plastic, non-slip stool. Keep the floor dry. Clean up any water that spills on the floor as soon as it happens. Remove soap buildup in the tub or shower regularly. Attach bath mats securely with double-sided non-slip rug tape. Do not have throw rugs and other things on the floor that can make you trip. What can I do in the bedroom? Use night lights. Make sure that you have a light by your bed that is easy to reach. Do not use any sheets or blankets that are too big for your bed. They should not hang down onto the floor. Have a firm chair that has side arms. You can use this for support while you get dressed. Do not have throw rugs and other things on the floor that can make you trip. What can I do in the kitchen? Clean up any spills right away. Avoid walking on wet floors. Keep items that you use a lot in easy-to-reach places. If you need to reach something above you, use a strong step stool that has a grab bar. Keep electrical cords out of the way. Do not use floor polish or wax  that makes floors slippery. If you must use wax, use non-skid floor wax. Do not have throw rugs and other things on the floor that can make you trip. What can I do with my stairs? Do not leave any items on the stairs. Make sure that there are handrails on both sides of the stairs and use them. Fix handrails that are broken or loose. Make sure that handrails are as long as the stairways. Check any carpeting to make sure that it is firmly attached to the stairs. Fix any carpet that is loose or worn. Avoid having throw rugs at the top or bottom of the stairs. If you do have throw rugs, attach them to the floor with carpet  tape. Make sure that you have a light switch at the top of the stairs and the bottom of the stairs. If you do not have them, ask someone to add them for you. What else can I do to help prevent falls? Wear shoes that: Do not have high heels. Have rubber bottoms. Are comfortable and fit you well. Are closed at the toe. Do not wear sandals. If you use a stepladder: Make sure that it is fully opened. Do not climb a closed stepladder. Make sure that both sides of the stepladder are locked into place. Ask someone to hold it for you, if possible. Clearly mark and make sure that you can see: Any grab bars or handrails. First and last steps. Where the edge of each step is. Use tools that help you move around (mobility aids) if they are needed. These include: Canes. Walkers. Scooters. Crutches. Turn on the lights when you go into a dark area. Replace any light bulbs as soon as they burn out. Set up your furniture so you have a clear path. Avoid moving your furniture around. If any of your floors are uneven, fix them. If there are any pets around you, be aware of where they are. Review your medicines with your doctor. Some medicines can make you feel dizzy. This can increase your chance of falling. Ask your doctor what other things that you can do to help prevent falls. This information is not intended to replace advice given to you by your health care provider. Make sure you discuss any questions you have with your health care provider. Document Released: 05/29/2009 Document Revised: 01/08/2016 Document Reviewed: 09/06/2014 Elsevier Interactive Patient Education  2017 [employer].

## 2021-01-23 NOTE — Progress Notes (Addendum)
Subjective:   Monica Horn is a 75 y.o. female who presents for Medicare Annual (Subsequent) preventive examination.  Review of Systems     Cardiac Risk Factors include: advanced age (>12men, >35 women);diabetes mellitus;dyslipidemia;family history of premature cardiovascular disease;hypertension;obesity (BMI >30kg/m2)     Objective:    Today's Vitals   01/23/21 1357  BP: 118/80  Pulse: 93  Resp: 16  Temp: 97.6 F (36.4 C)  TempSrc: Temporal  SpO2: 98%  Weight: 170 lb 3.2 oz (77.2 kg)  Height: 5\' 1"  (1.549 m)  PainSc: 0-No pain   Body mass index is 32.16 kg/m.  Advanced Directives 01/23/2021 11/20/2019 10/18/2018 10/18/2018 10/13/2018 09/15/2018 09/09/2017  Does Patient Have a Medical Advance Directive? No No No No Yes;No No No  Does patient want to make changes to medical advance directive? - - No - Patient declined No - Patient declined No - Patient declined - -  Would patient like information on creating a medical advance directive? Yes (MAU/Ambulatory/Procedural Areas - Information given) Yes (MAU/Ambulatory/Procedural Areas - Information given) No - Patient declined No - Patient declined No - Patient declined No - Patient declined Yes (ED - Information included in AVS)    Current Medications (verified) Outpatient Encounter Medications as of 01/23/2021  Medication Sig   celecoxib (CELEBREX) 200 MG capsule Take 1 capsule (200 mg total) by mouth daily as needed for moderate pain.   cetirizine (ZYRTEC) 10 MG tablet Take 1 tablet (10 mg total) by mouth daily as needed. (Patient taking differently: Take 10 mg by mouth daily as needed for allergies.)   cholecalciferol (VITAMIN D3) 10 MCG (400 UNIT) TABS tablet Take 1,000 Units by mouth daily.   furosemide (LASIX) 20 MG tablet Take 1-2 tablets (20-40 mg total) by mouth daily.   Multiple Vitamin (MULTIVITAMIN) tablet Take 1 tablet by mouth daily.   pantoprazole (PROTONIX) 40 MG tablet Take 1 tablet (40 mg total) by mouth daily.    triamterene-hydrochlorothiazide (MAXZIDE-25) 37.5-25 MG tablet Take 1 tablet by mouth daily.   trolamine salicylate (ASPERCREME) 10 % cream Apply 1 application topically 2 (two) times daily as needed for muscle pain.   No facility-administered encounter medications on file as of 01/23/2021.    Allergies (verified) Amlodipine, Benazepril, Losartan, and Lovastatin   History: Past Medical History:  Diagnosis Date   Abnormal EKG 2014   NONSPECIFIC ST DEPRESSION + T ANORMALITY, WORKED UP 2014 DR Crissie Sickles AND RELEASED BY DR Lovena Le   Allergy    Arthritis    Blood transfusion without reported diagnosis    Colon polyps    Diabetes mellitus    PT. 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 liver ca   Alcohol abuse Father    Hypertension Other    Stroke Other    Colon cancer Neg Hx    Colon polyps Neg Hx    Rectal cancer Neg Hx    Stomach cancer Neg Hx    Social History   Socioeconomic History   Marital status: Married    Spouse name: Not on file   Number of children: Not on file   Years of education: Not on file   Highest education level: Not on file  Occupational History   Not on  file  Tobacco Use   Smoking status: Former    Pack years: 0.00    Types: Cigarettes    Quit date: 54    Years since quitting: 43.4   Smokeless tobacco: Never  Vaping Use   Vaping Use: Never used  Substance and Sexual Activity   Alcohol use: Yes    Comment: occasional   Drug use: No   Sexual activity: Yes  Other Topics Concern   Not on file  Social History Narrative   Not on file   Social Determinants of Health   Financial Resource Strain: Low Risk    Difficulty of Paying Living Expenses: Not hard at all  Food Insecurity: No Food Insecurity   Worried About Charity fundraiser in the Last Year: Never true   [place] in the Last Year: Never true  Transportation Needs: No Transportation Needs   Lack of Transportation (Medical): No   Lack of Transportation (Non-Medical): No  Physical Activity: Sufficiently Active   Days of Exercise per Week: 5 days   Minutes of Exercise per Session: 50 min  Stress: No Stress Concern Present   Feeling of Stress : Not at all  Social Connections: Socially Integrated   Frequency of Communication with Friends and Family: More than three times a week   Frequency of Social Gatherings with Friends and Family: More than three times a week   Attends Religious Services: More than 4 times per year   Active Member of [employer] or Organizations: Yes   Attends Music therapist: More than 4 times per year   Marital Status: Married    Tobacco Counseling Counseling given: Not Answered   Clinical Intake:  Pre-visit preparation completed: Yes  Pain : No/denies pain Pain Score: 0-No pain     BMI - recorded: 32.16 Nutritional Status: BMI > 30  Obese Nutritional Risks: None Diabetes: Yes CBG done?: No Did pt. bring in CBG monitor from home?: No  How often do you need to have someone help you when you read instructions, pamphlets, or other written materials from your doctor or pharmacy?: 1 - Never What is the last grade level you  completed in school?: High School Graduate; some college  Diabetic? yes  Interpreter Needed?: No  Information entered by :: Lisette Abu, LPN   Activities of Daily Living In your present state of health, do you have any difficulty performing the following activities: 01/23/2021  Hearing? N  Vision? N  Difficulty concentrating or making decisions? N  Walking or climbing stairs? N  Dressing or bathing? N  Doing errands, shopping? N  Preparing Food and eating ? N  Using the Toilet? N  In the past six months, have you accidently leaked urine?  N  Do you have problems with loss of bowel control? N  Managing your Medications? N  Managing your Finances? N  Housekeeping or managing your Housekeeping? N  Some recent data might be hidden    Patient Care Team: Plotnikov, Evie Lacks, MD as PCP - General Ladene Artist, MD (Gastroenterology) Calvert Cantor, MD as Consulting Physician (Ophthalmology) Paulla Dolly Tamala Fothergill, DPM as Consulting Physician (Podiatry) Gaynelle Arabian, MD as Consulting Physician (Orthopedic Surgery)  Indicate any recent Medical Services you may have received from other than [facility] providers in the past year (date may be approximate).     Assessment:   This is a routine wellness examination for Jozette.  Hearing/Vision screen Hearing Screening - Comments:: No issues with hearing Vision Screening - Comments:: Eye Exam done by [facility] on 01/23/2021.  Family history of glaucoma.  Dietary issues and exercise activities discussed: Current Exercise Habits: Structured exercise class;Home exercise routine, Type of exercise: walking;treadmill;stretching;strength training/weights;Other - see comments (balancing, gardening, 45 mins 3-5 days a week), Time (Minutes): 45, Frequency (Times/Week): 5, Weekly Exercise (Minutes/Week): 225, Intensity: Moderate   Goals Addressed               This Visit's Progress     Patient Stated (pt-stated)        My goal is to  lose 10 pounds, keep my blood pressure under control and continue to increase my physical activity.  Also I would like to continue to keep my mind sharp and being independent.       Depression Screen PHQ 2/9 Scores 01/23/2021 01/13/2021 11/20/2019 09/15/2018 09/09/2017 12/17/2016 02/06/2016  PHQ - 2 Score 0 0 0 0 0 0 0  PHQ- 9 Score 0 0 - - 0 - -    Fall Risk Fall Risk  01/23/2021 01/13/2021 11/20/2019 09/15/2018 09/09/2017  Falls in the past year? 0 0 0 0 No  Number falls in past yr: 0 0 0 - -  Injury with Fall? 0 0 0 - -  Risk for fall due to : No Fall Risks No Fall Risks No Fall Risks - -  Follow up Falls evaluation completed - Falls evaluation completed;Education provided;Falls prevention discussed - -    FALL RISK PREVENTION PERTAINING TO THE HOME:  Any stairs in or around the home? No  If so, are there any without handrails? No  Home free of loose throw rugs in walkways, pet beds, electrical cords, etc? Yes  Adequate lighting in your home to reduce risk of falls? Yes   ASSISTIVE DEVICES UTILIZED TO PREVENT FALLS:  Life alert? Yes  (Apple Watch) Use of a cane, walker or w/c? No  Grab bars in the bathroom? Yes  Shower chair or bench in shower? No  Elevated toilet seat or a handicapped toilet? Yes   TIMED UP AND GO:  Was the test performed? Yes .  Length of time to ambulate 10 feet: 6 sec.   Gait steady and fast without use of assistive device  Cognitive Function: MMSE - Mini Mental State Exam 09/09/2017  Orientation to time 5  Orientation to Place 5  Registration 3  Attention/ Calculation 5  Recall 2  Language- name 2 objects 2  Language- repeat 1  Language- follow 3 step command 3  Language- read & follow direction 1  Write a sentence 1  Copy design 1  Total score 29     6CIT Screen 11/20/2019  What Year? 0 points  What month? 0 points  What time?  0 points  Count back from 20 0 points  Months in reverse 0 points  Repeat phrase 0 points  Total Score 0     Immunizations Immunization History  Administered Date(s) Administered   Fluad Quad(high Dose 65+) 04/17/2019, 07/29/2020   Influenza Split 06/13/2012   Influenza Whole 07/24/2002, 05/22/2008, 06/06/2009, 07/27/2010   Influenza, High Dose Seasonal PF 06/20/2017, 05/16/2018, 07/18/2018   Influenza,inj,Quad PF,6+ Mos 08/03/2013, 08/02/2014, 06/27/2015, 05/04/2016   Influenza,inj,quad, With Preservative 06/16/2017   PFIZER(Purple Top)SARS-COV-2 Vaccination 10/09/2019, 10/30/2019, 06/05/2020, 01/22/2021   Pneumococcal Conjugate-13 10/02/2015   Pneumococcal Polysaccharide-23 06/27/2015   Td 01/31/2009    TDAP status: Due, Education has been provided regarding the importance of this vaccine. Advised may receive this vaccine at local pharmacy or Health Dept. Aware to provide a copy of the vaccination record if obtained from local pharmacy or Health Dept. Verbalized acceptance and understanding.  Flu Vaccine status: Up to date  Pneumococcal vaccine status: Up to date  Covid-19 vaccine status: Completed vaccines  Qualifies for Shingles Vaccine? Yes   Zostavax completed No   Shingrix Completed?: No.    Education has been provided regarding the importance of this vaccine. Patient has been advised to call insurance company to determine out of pocket expense if they have not yet received this vaccine. Advised may also receive vaccine at local pharmacy or Health Dept. Verbalized acceptance and understanding.  Screening Tests Health Maintenance  Topic Date Due   Zoster Vaccines- Shingrix (1 of 2) Never done   URINE MICROALBUMIN  02/05/2017   TETANUS/TDAP  02/01/2019   FOOT EXAM  09/16/2019   INFLUENZA VACCINE  03/16/2021   HEMOGLOBIN A1C  07/15/2021   OPHTHALMOLOGY EXAM  01/23/2022   MAMMOGRAM  08/04/2022   COLONOSCOPY (Pts 45-56yrs Insurance coverage will need to be confirmed)  11/05/2023   DEXA SCAN  Completed   COVID-19 Vaccine  Completed   Hepatitis C Screening  Completed   PNA  vac Low Risk Adult  Completed   HPV VACCINES  Aged Out    Health Maintenance  Health Maintenance Due  Topic Date Due   Zoster Vaccines- Shingrix (1 of 2) Never done   URINE MICROALBUMIN  02/05/2017   TETANUS/TDAP  02/01/2019   FOOT EXAM  09/16/2019    Colorectal cancer screening: Type of screening: Colonoscopy. Completed 11/04/2020. Repeat every 3 years  Mammogram status: Completed 08/04/2020. Repeat every year  Bone Density status: Completed 10/07/2017. Results reflect: Bone density results: NORMAL. Repeat every 5 years.  Lung Cancer Screening: (Low Dose CT Chest recommended if Age 60-80 years, 30 pack-year currently smoking OR have quit w/in 15years.) does not qualify.   Lung Cancer Screening Referral: no  Additional Screening:  Hepatitis C Screening: does qualify; Completed yes  Vision Screening: Recommended annual ophthalmology exams for early detection of glaucoma and other disorders of the eye. Is the patient up to date with their annual eye exam?  Yes  Who is the provider or what is the name of the office in which the patient attends annual eye exams? [facility] If pt is not established with a provider, would they like to be referred to a provider to establish care? No .   Dental Screening: Recommended annual dental exams for proper oral hygiene  Community Resource Referral / Chronic Care Management: CRR required this visit?  No   CCM required this visit?  No      Plan:     I have personally reviewed and noted the following in  the patient's chart:   Medical and social history Use of alcohol, tobacco or illicit drugs  Current medications and supplements including opioid prescriptions.  Functional ability and status Nutritional status Physical activity Advanced directives List of other physicians Hospitalizations, surgeries, and ER visits in previous 12 months Vitals Screenings to include cognitive, depression, and falls Referrals and  appointments  In addition, I have reviewed and discussed with patient certain preventive protocols, quality metrics, and best practice recommendations. A written personalized care plan for preventive services as well as general preventive health recommendations were provided to patient.     Sheral Flow, LPN   [id number]   Nurse Notes: n/a

## 2021-04-21 ENCOUNTER — Ambulatory Visit: Payer: Medicare HMO | Admitting: Internal Medicine

## 2021-05-05 ENCOUNTER — Ambulatory Visit (INDEPENDENT_AMBULATORY_CARE_PROVIDER_SITE_OTHER): Payer: Medicare HMO | Admitting: Internal Medicine

## 2021-05-05 ENCOUNTER — Other Ambulatory Visit: Payer: Self-pay

## 2021-05-05 ENCOUNTER — Encounter: Payer: Self-pay | Admitting: Internal Medicine

## 2021-05-05 VITALS — BP 130/72 | HR 93 | Temp 98.0°F | Ht 61.0 in | Wt 177.4 lb

## 2021-05-05 DIAGNOSIS — E119 Type 2 diabetes mellitus without complications: Secondary | ICD-10-CM

## 2021-05-05 DIAGNOSIS — E785 Hyperlipidemia, unspecified: Secondary | ICD-10-CM | POA: Diagnosis not present

## 2021-05-05 DIAGNOSIS — J301 Allergic rhinitis due to pollen: Secondary | ICD-10-CM | POA: Diagnosis not present

## 2021-05-05 DIAGNOSIS — I10 Essential (primary) hypertension: Secondary | ICD-10-CM

## 2021-05-05 DIAGNOSIS — Z23 Encounter for immunization: Secondary | ICD-10-CM

## 2021-05-05 LAB — COMPREHENSIVE METABOLIC PANEL
ALT: 36 U/L — ABNORMAL HIGH (ref 0–35)
AST: 29 U/L (ref 0–37)
Albumin: 4.1 g/dL (ref 3.5–5.2)
Alkaline Phosphatase: 102 U/L (ref 39–117)
BUN: 18 mg/dL (ref 6–23)
CO2: 29 mEq/L (ref 19–32)
Calcium: 9.6 mg/dL (ref 8.4–10.5)
Chloride: 105 mEq/L (ref 96–112)
Creatinine, Ser: 0.97 mg/dL (ref 0.40–1.20)
GFR: 57.59 mL/min — ABNORMAL LOW (ref >60.00)
Glucose, Bld: 89 mg/dL (ref 70–99)
Potassium: 3.9 mEq/L (ref 3.5–5.1)
Sodium: 141 mEq/L (ref 135–145)
Total Bilirubin: 0.5 mg/dL (ref 0.2–1.2)
Total Protein: 7.5 g/dL (ref 6.0–8.3)

## 2021-05-05 LAB — HEMOGLOBIN A1C: Hgb A1c MFr Bld: 6.8 % — ABNORMAL HIGH (ref 4.6–6.5)

## 2021-05-05 LAB — TSH: TSH: 1.04 u[IU]/mL (ref 0.35–5.50)

## 2021-05-05 MED ORDER — PANTOPRAZOLE SODIUM 40 MG PO TBEC: 40.0000 mg | DELAYED_RELEASE_TABLET | Freq: Every day | ORAL | 3 refills | Status: DC

## 2021-05-05 MED ORDER — RYBELSUS 3 MG PO TABS: 3.0000 mg | ORAL_TABLET | Freq: Every day | ORAL | 5 refills | Status: AC

## 2021-05-05 MED ORDER — TRIAMTERENE-HCTZ 37.5-25 MG PO TABS: 1.0000 | ORAL_TABLET | Freq: Every day | ORAL | 3 refills | Status: DC

## 2021-05-05 NOTE — Assessment & Plan Note (Signed)
Use Sinus rinse - given

## 2021-05-05 NOTE — Progress Notes (Signed)
Subjective:  Patient ID: Monica Horn, female    DOB: 30-Dec-1945  Age: 75 y.o. MRN: [id number]  CC: Follow-up (3 month f/u- Flu shot)   HPI Monica Horn presents for wt gain, OA, leg swelling, HTN C/o allergies  Outpatient Medications Prior to Visit  Medication Sig Dispense Refill   celecoxib (CELEBREX) 200 MG capsule Take 1 capsule (200 mg total) by mouth daily as needed for moderate pain. 180 capsule 0   cetirizine (ZYRTEC) 10 MG tablet Take 1 tablet (10 mg total) by mouth daily as needed. (Patient taking differently: Take 10 mg by mouth daily as needed for allergies.) 90 tablet 3   cholecalciferol (VITAMIN D3) 10 MCG (400 UNIT) TABS tablet Take 1,000 Units by mouth daily.     furosemide (LASIX) 20 MG tablet Take 1-2 tablets (20-40 mg total) by mouth daily. 180 tablet 3   Multiple Vitamin (MULTIVITAMIN) tablet Take 1 tablet by mouth daily.     pantoprazole (PROTONIX) 40 MG tablet Take 1 tablet (40 mg total) by mouth daily. 90 tablet 3   triamterene-hydrochlorothiazide (MAXZIDE-25) 37.5-25 MG tablet Take 1 tablet by mouth daily. 90 tablet 3   trolamine salicylate (ASPERCREME) 10 % cream Apply 1 application topically 2 (two) times daily as needed for muscle pain.     No facility-administered medications prior to visit.    ROS: Review of Systems  Constitutional:  Positive for unexpected weight change. Negative for activity change, appetite change, chills and fatigue.  HENT:  Positive for congestion and postnasal drip. Negative for mouth sores and sinus pressure.   Eyes:  Negative for visual disturbance.  Respiratory:  Negative for cough and chest tightness.   Gastrointestinal:  Negative for abdominal pain and nausea.  Genitourinary:  Negative for difficulty urinating, frequency and vaginal pain.  Musculoskeletal:  Positive for arthralgias and gait problem. Negative for back pain.  Skin:  Negative for pallor and rash.  Neurological:  Negative for dizziness, tremors, weakness,  numbness and headaches.  Psychiatric/Behavioral:  Negative for confusion, sleep disturbance and suicidal ideas.    Objective:  BP 130/72 (BP Location: Left Arm)   Pulse 93   Temp 98 F (36.7 C) (Oral)   Ht 5\' 1"  (1.549 m)   Wt 177 lb 6.4 oz (80.5 kg)   SpO2 97%   BMI 33.52 kg/m   BP Readings from Last 3 Encounters:  05/05/21 130/72  01/23/21 118/80  01/13/21 130/72    Wt Readings from Last 3 Encounters:  05/05/21 177 lb 6.4 oz (80.5 kg)  01/23/21 170 lb 3.2 oz (77.2 kg)  01/13/21 167 lb (75.8 kg)    Physical Exam Constitutional:      General: She is not in acute distress.    Appearance: She is well-developed. She is obese.  HENT:     Head: Normocephalic.     Right Ear: External ear normal.     Left Ear: External ear normal.     Nose: Congestion present.  Eyes:     General:        Right eye: No discharge.        Left eye: No discharge.     Conjunctiva/sclera: Conjunctivae normal.     Pupils: Pupils are equal, round, and reactive to light.  Neck:     Thyroid: No thyromegaly.     Vascular: No JVD.     Trachea: No tracheal deviation.  Cardiovascular:     Rate and Rhythm: Normal rate and regular rhythm.  Heart sounds: Normal heart sounds.  Pulmonary:     Effort: No respiratory distress.     Breath sounds: No stridor. No wheezing.  Abdominal:     General: Bowel sounds are normal. There is no distension.     Palpations: Abdomen is soft. There is no mass.     Tenderness: There is no abdominal tenderness. There is no guarding or rebound.  Musculoskeletal:        General: Tenderness present.     Cervical back: Normal range of motion and neck supple. No rigidity.  Lymphadenopathy:     Cervical: No cervical adenopathy.  Skin:    Findings: No erythema or rash.  Neurological:     Mental Status: She is oriented to person, place, and time.     Cranial Nerves: No cranial nerve deficit.     Motor: No abnormal muscle tone.     Coordination: Coordination normal.      Deep Tendon Reflexes: Reflexes normal.  Psychiatric:        Behavior: Behavior normal.        Thought Content: Thought content normal.        Judgment: Judgment normal.    Lab Results  Component Value Date   WBC 6.4 09/15/2020   HGB 13.2 09/15/2020   HCT 41.2 09/15/2020   PLT 283.0 09/15/2020   GLUCOSE 82 01/13/2021   CHOL 192 06/24/2017   TRIG 126.0 06/24/2017   HDL 37.00 (L) 06/24/2017   LDLDIRECT 120.0 05/13/2015   LDLCALC 130 (H) 06/24/2017   ALT 43 (H) 01/13/2021   AST 29 01/13/2021   NA 140 01/13/2021   K 3.6 01/13/2021   CL 104 01/13/2021   CREATININE 0.96 01/13/2021   BUN 19 01/13/2021   CO2 28 01/13/2021   TSH 2.06 06/24/2017   INR 0.9 10/13/2018   HGBA1C 6.7 (H) 01/13/2021   MICROALBUR 1.5 02/06/2016    MM 3D SCREEN BREAST BILATERAL  Result Date: 08/06/2020 CLINICAL DATA:  Screening. EXAM: DIGITAL SCREENING BILATERAL MAMMOGRAM WITH TOMO AND CAD COMPARISON:  Previous exam(s). ACR Breast Density Category b: There are scattered areas of fibroglandular density. FINDINGS: There are no findings suspicious for malignancy. Images were processed with CAD. IMPRESSION: No mammographic evidence of malignancy. A result letter of this screening mammogram will be mailed directly to the patient. RECOMMENDATION: Screening mammogram in one year. (Code:SM-B-01Y) BI-RADS CATEGORY  1: Negative. Electronically Signed   By: Claudie Revering M.D.   On: 08/06/2020 16:36    Assessment & Plan:   Problem List Items Addressed This Visit     Allergic rhinitis    Use Sinus rinse - given      Diabetes type 2, controlled ([place]) - Primary    Will try Rybelsus 3 mg qd Check A1c      Relevant Medications   Semaglutide (RYBELSUS) 3 MG TABS   Other Relevant Orders   Comprehensive metabolic panel   Hemoglobin A1c   TSH   Dyslipidemia   Relevant Orders   TSH   Essential hypertension    On Maxzide Check CMET      Other Visit Diagnoses     Needs flu shot       Relevant Orders   Flu  Vaccine QUAD High Dose(Fluad) (Completed)          Walker Kehr, MD

## 2021-05-05 NOTE — Addendum Note (Signed)
Addended by: Boris Lown B on: 05/05/2021 03:19 PM   Modules accepted: Orders

## 2021-05-05 NOTE — Assessment & Plan Note (Signed)
Will try Rybelsus 3 mg qd Check A1c

## 2021-05-05 NOTE — Assessment & Plan Note (Signed)
On Maxzide Check CMET

## 2021-06-12 ENCOUNTER — Telehealth: Payer: Self-pay | Admitting: Internal Medicine

## 2021-06-12 NOTE — Telephone Encounter (Signed)
Notified pt kidney function was checked by in Sept and per MD notes was stable. Pt isnot due until Dec for another lab test. Pt also states that she stop taking the Rybelsus. She states med was making her tired, she got nauseated on stomach, and was having heart burns.Marland KitchenJohny Chess

## 2021-06-12 NOTE — Telephone Encounter (Signed)
Patient & [employer] rep called in  Patient is needing lab orders placed to test her kidney function  Please place orders & let patient know shes able to go to lab [phone]

## 2021-07-01 ENCOUNTER — Ambulatory Visit (INDEPENDENT_AMBULATORY_CARE_PROVIDER_SITE_OTHER): Payer: Medicare HMO | Admitting: Internal Medicine

## 2021-07-01 ENCOUNTER — Encounter: Payer: Self-pay | Admitting: Internal Medicine

## 2021-07-01 ENCOUNTER — Other Ambulatory Visit: Payer: Self-pay

## 2021-07-01 DIAGNOSIS — J0101 Acute recurrent maxillary sinusitis: Secondary | ICD-10-CM | POA: Diagnosis not present

## 2021-07-01 DIAGNOSIS — J019 Acute sinusitis, unspecified: Secondary | ICD-10-CM | POA: Insufficient documentation

## 2021-07-01 HISTORY — DX: Acute sinusitis, unspecified: J01.90

## 2021-07-01 MED ORDER — CEFDINIR 300 MG PO CAPS: 300.0000 mg | ORAL_CAPSULE | Freq: Two times a day (BID) | ORAL | 0 refills | Status: DC

## 2021-07-01 NOTE — Progress Notes (Signed)
Subjective:  Patient ID: Monica Horn, female    DOB: 01-21-1946  Age: 75 y.o. MRN: [id number]  CC: Dizziness (Pt states sxs started around 1st Oct) and Sinusitis   HPI Monica Horn presents for sinusitis sx's, dizziness x 2 wks Thick d/c  Outpatient Medications Prior to Visit  Medication Sig Dispense Refill   celecoxib (CELEBREX) 200 MG capsule Take 1 capsule (200 mg total) by mouth daily as needed for moderate pain. 180 capsule 0   cetirizine (ZYRTEC) 10 MG tablet Take 1 tablet (10 mg total) by mouth daily as needed. (Patient taking differently: Take 10 mg by mouth daily as needed for allergies.) 90 tablet 3   cholecalciferol (VITAMIN D3) 10 MCG (400 UNIT) TABS tablet Take 1,000 Units by mouth daily.     furosemide (LASIX) 20 MG tablet Take 1-2 tablets (20-40 mg total) by mouth daily. 180 tablet 3   Multiple Vitamin (MULTIVITAMIN) tablet Take 1 tablet by mouth daily.     pantoprazole (PROTONIX) 40 MG tablet Take 1 tablet (40 mg total) by mouth daily. 90 tablet 3   triamterene-hydrochlorothiazide (MAXZIDE-25) 37.5-25 MG tablet Take 1 tablet by mouth daily. 90 tablet 3   trolamine salicylate (ASPERCREME) 10 % cream Apply 1 application topically 2 (two) times daily as needed for muscle pain.     No facility-administered medications prior to visit.    ROS: Review of Systems  Constitutional:  Positive for fatigue. Negative for activity change, appetite change, chills and unexpected weight change.  HENT:  Positive for congestion and ear pain. Negative for mouth sores and sinus pressure.   Eyes:  Negative for visual disturbance.  Respiratory:  Negative for cough and chest tightness.   Gastrointestinal:  Negative for abdominal pain and nausea.  Genitourinary:  Negative for difficulty urinating, frequency and vaginal pain.  Musculoskeletal:  Negative for back pain and gait problem.  Skin:  Negative for pallor and rash.  Neurological:  Positive for dizziness. Negative for tremors,  weakness, numbness and headaches.  Psychiatric/Behavioral:  Negative for confusion and sleep disturbance.    Objective:  BP 112/78 (BP Location: Left Arm)   Pulse (!) 103   Temp 98.3 F (36.8 C) (Oral)   Ht 5\' 1"  (1.549 m)   Wt 179 lb 6.4 oz (81.4 kg)   SpO2 97%   BMI 33.90 kg/m   BP Readings from Last 3 Encounters:  07/01/21 112/78  05/05/21 130/72  01/23/21 118/80    Wt Readings from Last 3 Encounters:  07/01/21 179 lb 6.4 oz (81.4 kg)  05/05/21 177 lb 6.4 oz (80.5 kg)  01/23/21 170 lb 3.2 oz (77.2 kg)    Physical Exam Constitutional:      General: She is not in acute distress.    Appearance: She is well-developed.  HENT:     Head: Normocephalic.     Right Ear: External ear normal.     Left Ear: External ear normal.     Nose: Congestion present.  Eyes:     General:        Right eye: No discharge.        Left eye: No discharge.     Conjunctiva/sclera: Conjunctivae normal.     Pupils: Pupils are equal, round, and reactive to light.  Neck:     Thyroid: No thyromegaly.     Vascular: No JVD.     Trachea: No tracheal deviation.  Cardiovascular:     Rate and Rhythm: Normal rate and regular rhythm.  Heart sounds: Normal heart sounds.  Pulmonary:     Effort: No respiratory distress.     Breath sounds: No stridor. No wheezing.  Abdominal:     General: Bowel sounds are normal. There is no distension.     Palpations: Abdomen is soft. There is no mass.     Tenderness: There is no abdominal tenderness. There is no guarding or rebound.  Musculoskeletal:        General: No tenderness.     Cervical back: Normal range of motion and neck supple. No rigidity.  Lymphadenopathy:     Cervical: No cervical adenopathy.  Skin:    Findings: No erythema or rash.  Neurological:     Cranial Nerves: No cranial nerve deficit.     Motor: No abnormal muscle tone.     Coordination: Coordination normal.     Deep Tendon Reflexes: Reflexes normal.  Psychiatric:        Behavior:  Behavior normal.        Thought Content: Thought content normal.        Judgment: Judgment normal.  Nasal d/c  Lab Results  Component Value Date   WBC 6.4 09/15/2020   HGB 13.2 09/15/2020   HCT 41.2 09/15/2020   PLT 283.0 09/15/2020   GLUCOSE 89 05/05/2021   CHOL 192 06/24/2017   TRIG 126.0 06/24/2017   HDL 37.00 (L) 06/24/2017   LDLDIRECT 120.0 05/13/2015   LDLCALC 130 (H) 06/24/2017   ALT 36 (H) 05/05/2021   AST 29 05/05/2021   NA 141 05/05/2021   K 3.9 05/05/2021   CL 105 05/05/2021   CREATININE 0.97 05/05/2021   BUN 18 05/05/2021   CO2 29 05/05/2021   TSH 1.04 05/05/2021   INR 0.9 10/13/2018   HGBA1C 6.8 (H) 05/05/2021   MICROALBUR 1.5 02/06/2016    MM 3D SCREEN BREAST BILATERAL  Result Date: 08/06/2020 CLINICAL DATA:  Screening. EXAM: DIGITAL SCREENING BILATERAL MAMMOGRAM WITH TOMO AND CAD COMPARISON:  Previous exam(s). ACR Breast Density Category b: There are scattered areas of fibroglandular density. FINDINGS: There are no findings suspicious for malignancy. Images were processed with CAD. IMPRESSION: No mammographic evidence of malignancy. A result letter of this screening mammogram will be mailed directly to the patient. RECOMMENDATION: Screening mammogram in one year. (Code:SM-B-01Y) BI-RADS CATEGORY  1: Negative. Electronically Signed   By: Claudie Revering M.D.   On: 08/06/2020 16:36    Assessment & Plan:   Problem List Items Addressed This Visit     Acute sinusitis    Omnicef      Relevant Medications   cefdinir (OMNICEF) 300 MG capsule      Meds ordered this encounter  Medications   cefdinir (OMNICEF) 300 MG capsule    Sig: Take 1 capsule (300 mg total) by mouth 2 (two) times daily.    Dispense:  20 capsule    Refill:  0      Follow-up: Return in about 3 months (around 10/01/2021) for a follow-up visit.  Walker Kehr, MD

## 2021-07-01 NOTE — Assessment & Plan Note (Signed)
Omnicef

## 2021-07-23 ENCOUNTER — Other Ambulatory Visit: Payer: Self-pay | Admitting: Internal Medicine

## 2021-07-23 DIAGNOSIS — Z1231 Encounter for screening mammogram for malignant neoplasm of breast: Secondary | ICD-10-CM

## 2021-08-04 ENCOUNTER — Ambulatory Visit: Payer: Medicare HMO | Admitting: Internal Medicine

## 2021-08-06 ENCOUNTER — Encounter: Payer: Self-pay | Admitting: Internal Medicine

## 2021-08-06 ENCOUNTER — Ambulatory Visit (INDEPENDENT_AMBULATORY_CARE_PROVIDER_SITE_OTHER): Payer: Medicare HMO | Admitting: Internal Medicine

## 2021-08-06 ENCOUNTER — Other Ambulatory Visit: Payer: Self-pay

## 2021-08-06 DIAGNOSIS — J301 Allergic rhinitis due to pollen: Secondary | ICD-10-CM

## 2021-08-06 LAB — COMPREHENSIVE METABOLIC PANEL
ALT: 30 U/L (ref 0–35)
AST: 26 U/L (ref 0–37)
Albumin: 4.1 g/dL (ref 3.5–5.2)
Alkaline Phosphatase: 102 U/L (ref 39–117)
BUN: 13 mg/dL (ref 6–23)
CO2: 27 mEq/L (ref 19–32)
Calcium: 9.6 mg/dL (ref 8.4–10.5)
Chloride: 106 mEq/L (ref 96–112)
Creatinine, Ser: 0.98 mg/dL (ref 0.40–1.20)
GFR: 56.78 mL/min — ABNORMAL LOW (ref >60.00)
Glucose, Bld: 190 mg/dL — ABNORMAL HIGH (ref 70–99)
Potassium: 3.4 mEq/L — ABNORMAL LOW (ref 3.5–5.1)
Sodium: 140 mEq/L (ref 135–145)
Total Bilirubin: 0.5 mg/dL (ref 0.2–1.2)
Total Protein: 7.4 g/dL (ref 6.0–8.3)

## 2021-08-06 LAB — HEMOGLOBIN A1C: Hgb A1c MFr Bld: 7 % — ABNORMAL HIGH (ref 4.6–6.5)

## 2021-08-06 MED ORDER — CETIRIZINE HCL 10 MG PO TABS: 10.0000 mg | ORAL_TABLET | Freq: Every day | ORAL | 3 refills | Status: AC | PRN

## 2021-08-06 MED ORDER — FLUTICASONE PROPIONATE 50 MCG/ACT NA SUSP: 2.0000 | Freq: Every day | NASAL | 6 refills | Status: DC

## 2021-08-06 NOTE — Assessment & Plan Note (Signed)
Zyrtec Use Sinus rinse - given Flonase

## 2021-08-06 NOTE — Progress Notes (Signed)
Subjective:  Patient ID: Monica Horn, female    DOB: July 12, 1946  Age: 75 y.o. MRN: [id number]  CC: Follow-up (3 month f/u)   HPI Keith J Duplantis presents for sinus congestion Better after abx F/u HTN, DM  Outpatient Medications Prior to Visit  Medication Sig Dispense Refill   celecoxib (CELEBREX) 200 MG capsule Take 1 capsule (200 mg total) by mouth daily as needed for moderate pain. 180 capsule 0   cholecalciferol (VITAMIN D3) 10 MCG (400 UNIT) TABS tablet Take 1,000 Units by mouth daily.     furosemide (LASIX) 20 MG tablet Take 1-2 tablets (20-40 mg total) by mouth daily. 180 tablet 3   Multiple Vitamin (MULTIVITAMIN) tablet Take 1 tablet by mouth daily.     pantoprazole (PROTONIX) 40 MG tablet Take 1 tablet (40 mg total) by mouth daily. 90 tablet 3   triamterene-hydrochlorothiazide (MAXZIDE-25) 37.5-25 MG tablet Take 1 tablet by mouth daily. 90 tablet 3   trolamine salicylate (ASPERCREME) 10 % cream Apply 1 application topically 2 (two) times daily as needed for muscle pain.     cetirizine (ZYRTEC) 10 MG tablet Take 1 tablet (10 mg total) by mouth daily as needed. (Patient taking differently: Take 10 mg by mouth daily as needed for allergies.) 90 tablet 3   cefdinir (OMNICEF) 300 MG capsule Take 1 capsule (300 mg total) by mouth 2 (two) times daily. (Patient not taking: Reported on 08/06/2021) 20 capsule 0   No facility-administered medications prior to visit.    ROS: Review of Systems  Constitutional:  Negative for activity change, appetite change, chills, fatigue and unexpected weight change.  HENT:  Negative for congestion, mouth sores and sinus pressure.   Eyes:  Negative for visual disturbance.  Respiratory:  Negative for cough and chest tightness.   Gastrointestinal:  Negative for abdominal pain and nausea.  Genitourinary:  Negative for difficulty urinating, frequency and vaginal pain.  Musculoskeletal:  Negative for back pain and gait problem.  Skin:  Negative for  pallor and rash.  Neurological:  Negative for dizziness, tremors, weakness, numbness and headaches.  Psychiatric/Behavioral:  Negative for confusion and sleep disturbance.    Objective:  BP 120/72 (BP Location: Left Arm)    Pulse 97    Temp 97.8 F (36.6 C) (Oral)    Ht 5\' 1"  (1.549 m)    Wt 177 lb 3.2 oz (80.4 kg)    SpO2 96%    BMI 33.48 kg/m   BP Readings from Last 3 Encounters:  08/06/21 120/72  07/01/21 112/78  05/05/21 130/72    Wt Readings from Last 3 Encounters:  08/06/21 177 lb 3.2 oz (80.4 kg)  07/01/21 179 lb 6.4 oz (81.4 kg)  05/05/21 177 lb 6.4 oz (80.5 kg)    Physical Exam Constitutional:      General: She is not in acute distress.    Appearance: She is well-developed.  HENT:     Head: Normocephalic.     Right Ear: External ear normal.     Left Ear: External ear normal.     Nose: Nose normal.  Eyes:     General:        Right eye: No discharge.        Left eye: No discharge.     Conjunctiva/sclera: Conjunctivae normal.     Pupils: Pupils are equal, round, and reactive to light.  Neck:     Thyroid: No thyromegaly.     Vascular: No JVD.     Trachea: No  tracheal deviation.  Cardiovascular:     Rate and Rhythm: Normal rate and regular rhythm.     Heart sounds: Normal heart sounds.  Pulmonary:     Effort: No respiratory distress.     Breath sounds: No stridor. No wheezing.  Abdominal:     General: Bowel sounds are normal. There is no distension.     Palpations: Abdomen is soft. There is no mass.     Tenderness: There is no abdominal tenderness. There is no guarding or rebound.  Musculoskeletal:        General: No tenderness.     Cervical back: Normal range of motion and neck supple. No rigidity.  Lymphadenopathy:     Cervical: No cervical adenopathy.  Skin:    Findings: No erythema or rash.  Neurological:     Cranial Nerves: No cranial nerve deficit.     Motor: No abnormal muscle tone.     Coordination: Coordination normal.     Deep Tendon  Reflexes: Reflexes normal.  Psychiatric:        Behavior: Behavior normal.        Thought Content: Thought content normal.        Judgment: Judgment normal.    Lab Results  Component Value Date   WBC 6.4 09/15/2020   HGB 13.2 09/15/2020   HCT 41.2 09/15/2020   PLT 283.0 09/15/2020   GLUCOSE 190 (H) 08/06/2021   CHOL 192 06/24/2017   TRIG 126.0 06/24/2017   HDL 37.00 (L) 06/24/2017   LDLDIRECT 120.0 05/13/2015   LDLCALC 130 (H) 06/24/2017   ALT 30 08/06/2021   AST 26 08/06/2021   NA 140 08/06/2021   K 3.4 (L) 08/06/2021   CL 106 08/06/2021   CREATININE 0.98 08/06/2021   BUN 13 08/06/2021   CO2 27 08/06/2021   TSH 1.04 05/05/2021   INR 0.9 10/13/2018   HGBA1C 7.0 (H) 08/06/2021   MICROALBUR 1.5 02/06/2016    MM 3D SCREEN BREAST BILATERAL  Result Date: 08/06/2020 CLINICAL DATA:  Screening. EXAM: DIGITAL SCREENING BILATERAL MAMMOGRAM WITH TOMO AND CAD COMPARISON:  Previous exam(s). ACR Breast Density Category b: There are scattered areas of fibroglandular density. FINDINGS: There are no findings suspicious for malignancy. Images were processed with CAD. IMPRESSION: No mammographic evidence of malignancy. A result letter of this screening mammogram will be mailed directly to the patient. RECOMMENDATION: Screening mammogram in one year. (Code:SM-B-01Y) BI-RADS CATEGORY  1: Negative. Electronically Signed   By: Claudie Revering M.D.   On: 08/06/2020 16:36    Assessment & Plan:   Problem List Items Addressed This Visit     Allergic rhinitis     Zyrtec Use Sinus rinse - given Flonase       Relevant Orders   Comprehensive metabolic panel (Completed)   Hemoglobin A1c (Completed)      Meds ordered this encounter  Medications   cetirizine (ZYRTEC) 10 MG tablet    Sig: Take 1 tablet (10 mg total) by mouth daily as needed.    Dispense:  90 tablet    Refill:  3   fluticasone (FLONASE) 50 MCG/ACT nasal spray    Sig: Place 2 sprays into both nostrils daily.    Dispense:  16  g    Refill:  6      Follow-up: No follow-ups on file.  Walker Kehr, MD

## 2021-08-17 ENCOUNTER — Encounter: Payer: Self-pay | Admitting: Internal Medicine

## 2021-08-25 ENCOUNTER — Ambulatory Visit
Admission: RE | Admit: 2021-08-25 | Discharge: 2021-08-25 | Disposition: A | Discharge status: Home / Self Care | Payer: Medicare HMO | Source: Ambulatory Visit | Attending: Internal Medicine | Admitting: Internal Medicine

## 2021-08-25 ENCOUNTER — Other Ambulatory Visit: Payer: Self-pay

## 2021-08-25 DIAGNOSIS — Z1231 Encounter for screening mammogram for malignant neoplasm of breast: Secondary | ICD-10-CM

## 2021-08-27 ENCOUNTER — Telehealth: Payer: Self-pay | Admitting: Internal Medicine

## 2021-08-27 NOTE — Telephone Encounter (Signed)
Okay. Thank you.

## 2021-08-27 NOTE — Telephone Encounter (Signed)
Patient states she spoke w/ provider at her last visit 08-06-2021 regarding accepting her sister in law Hattie as a new patient  Patient states provider agreed, is this correct, ok to schedule?

## 2021-08-30 ENCOUNTER — Encounter: Payer: Self-pay | Admitting: Internal Medicine

## 2021-08-31 NOTE — Telephone Encounter (Signed)
Are you planning on taking her sister-in-law for new patient.. (.Monica Horn 07/01/1956)...Johny Chess

## 2021-09-14 ENCOUNTER — Other Ambulatory Visit: Payer: Self-pay | Admitting: Internal Medicine

## 2021-09-14 DIAGNOSIS — M159 Polyosteoarthritis, unspecified: Secondary | ICD-10-CM

## 2021-09-14 DIAGNOSIS — M25472 Effusion, left ankle: Secondary | ICD-10-CM

## 2021-09-30 DIAGNOSIS — H40023 Open angle with borderline findings, high risk, bilateral: Secondary | ICD-10-CM | POA: Diagnosis not present

## 2021-09-30 DIAGNOSIS — K5090 Crohn's disease, unspecified, without complications: Secondary | ICD-10-CM | POA: Diagnosis not present

## 2021-09-30 DIAGNOSIS — H35033 Hypertensive retinopathy, bilateral: Secondary | ICD-10-CM | POA: Diagnosis not present

## 2021-09-30 DIAGNOSIS — H04123 Dry eye syndrome of bilateral lacrimal glands: Secondary | ICD-10-CM | POA: Diagnosis not present

## 2021-09-30 DIAGNOSIS — H25813 Combined forms of age-related cataract, bilateral: Secondary | ICD-10-CM | POA: Diagnosis not present

## 2021-10-05 ENCOUNTER — Ambulatory Visit (INDEPENDENT_AMBULATORY_CARE_PROVIDER_SITE_OTHER): Payer: Medicare HMO | Admitting: Internal Medicine

## 2021-10-05 ENCOUNTER — Encounter: Payer: Self-pay | Admitting: Internal Medicine

## 2021-10-05 ENCOUNTER — Other Ambulatory Visit: Payer: Self-pay

## 2021-10-05 DIAGNOSIS — J0101 Acute recurrent maxillary sinusitis: Secondary | ICD-10-CM

## 2021-10-05 DIAGNOSIS — E119 Type 2 diabetes mellitus without complications: Secondary | ICD-10-CM

## 2021-10-05 DIAGNOSIS — I10 Essential (primary) hypertension: Secondary | ICD-10-CM | POA: Diagnosis not present

## 2021-10-05 DIAGNOSIS — Z0000 Encounter for general adult medical examination without abnormal findings: Secondary | ICD-10-CM

## 2021-10-05 MED ORDER — AMOXICILLIN-POT CLAVULANATE 875-125 MG PO TABS: 1.0000 | ORAL_TABLET | Freq: Two times a day (BID) | ORAL | 0 refills | Status: DC

## 2021-10-05 NOTE — Assessment & Plan Note (Signed)
Recurrent Augmentin bid x 10 d

## 2021-10-05 NOTE — Assessment & Plan Note (Signed)
Cont on Maxzide 

## 2021-10-05 NOTE — Assessment & Plan Note (Addendum)
tDAP and Shingrix at the pharmacy due in 2023

## 2021-10-05 NOTE — Addendum Note (Signed)
Addended by: Cassandria Anger on: 10/05/2021 02:40 PM   Modules accepted: Orders

## 2021-10-05 NOTE — Progress Notes (Addendum)
Subjective:  Patient ID: Monica Horn, female    DOB: [birth year]  Age: 76 y.o. MRN: [id number]  CC: Follow-up and nasal drainage (Sinus pressure )   HPI Monica Horn presents for ongoing sinusitis sx's, DM, HTN, obesity  Outpatient Medications Prior to Visit  Medication Sig Dispense Refill   celecoxib (CELEBREX) 200 MG capsule TAKE 1 CAPSULE EVERY DAY AS NEEDED 90 capsule 1   cetirizine (ZYRTEC) 10 MG tablet Take 1 tablet (10 mg total) by mouth daily as needed. 90 tablet 3   cholecalciferol (VITAMIN D3) 10 MCG (400 UNIT) TABS tablet Take 1,000 Units by mouth daily.     fluticasone (FLONASE) 50 MCG/ACT nasal spray Place 2 sprays into both nostrils daily. 16 g 6   furosemide (LASIX) 20 MG tablet TAKE 1 TO 2 TABLETS EVERY DAY 180 tablet 3   Multiple Vitamin (MULTIVITAMIN) tablet Take 1 tablet by mouth daily.     pantoprazole (PROTONIX) 40 MG tablet TAKE 1 TABLET EVERY DAY 90 tablet 3   triamterene-hydrochlorothiazide (MAXZIDE-25) 37.5-25 MG tablet TAKE 1 TABLET EVERY DAY 90 tablet 3   trolamine salicylate (ASPERCREME) 10 % cream Apply 1 application topically 2 (two) times daily as needed for muscle pain.     No facility-administered medications prior to visit.    ROS: Review of Systems  Constitutional:  Negative for activity change, appetite change, chills, fatigue and unexpected weight change.  HENT:  Negative for congestion, mouth sores and sinus pressure.   Eyes:  Negative for visual disturbance.  Respiratory:  Negative for cough and chest tightness.   Gastrointestinal:  Negative for abdominal pain and nausea.  Genitourinary:  Negative for difficulty urinating, frequency and vaginal pain.  Musculoskeletal:  Negative for back pain and gait problem.  Skin:  Negative for pallor and rash.  Neurological:  Negative for dizziness, tremors, weakness, numbness and headaches.  Psychiatric/Behavioral:  Negative for confusion, sleep disturbance and suicidal ideas.    Objective:  BP  136/80    Pulse 78    Temp 97.9 F (36.6 C) (Oral)    Ht 5\' 1"  (1.549 m)    Wt 175 lb 2 oz (79.4 kg)    SpO2 99%    BMI 33.09 kg/m   BP Readings from Last 3 Encounters:  10/05/21 136/80  08/06/21 120/72  07/01/21 112/78    Wt Readings from Last 3 Encounters:  10/05/21 175 lb 2 oz (79.4 kg)  08/06/21 177 lb 3.2 oz (80.4 kg)  07/01/21 179 lb 6.4 oz (81.4 kg)    Physical Exam Constitutional:      General: She is not in acute distress.    Appearance: She is well-developed. She is obese.  HENT:     Head: Normocephalic.     Right Ear: External ear normal.     Left Ear: External ear normal.     Nose: Nose normal.  Eyes:     General:        Right eye: No discharge.        Left eye: No discharge.     Conjunctiva/sclera: Conjunctivae normal.     Pupils: Pupils are equal, round, and reactive to light.  Neck:     Thyroid: No thyromegaly.     Vascular: No JVD.     Trachea: No tracheal deviation.  Cardiovascular:     Rate and Rhythm: Normal rate and regular rhythm.     Heart sounds: Normal heart sounds.  Pulmonary:     Effort: No respiratory distress.  Breath sounds: No stridor. No wheezing.  Abdominal:     General: Bowel sounds are normal. There is no distension.     Palpations: Abdomen is soft. There is no mass.     Tenderness: There is no abdominal tenderness. There is no guarding or rebound.  Musculoskeletal:        General: No tenderness.     Cervical back: Normal range of motion and neck supple. No rigidity.  Lymphadenopathy:     Cervical: No cervical adenopathy.  Skin:    Findings: No erythema or rash.  Neurological:     Cranial Nerves: No cranial nerve deficit.     Motor: No abnormal muscle tone.     Coordination: Coordination normal.     Deep Tendon Reflexes: Reflexes normal.  Psychiatric:        Behavior: Behavior normal.        Thought Content: Thought content normal.        Judgment: Judgment normal.    Lab Results  Component Value Date   WBC 6.4  09/15/2020   HGB 13.2 09/15/2020   HCT 41.2 09/15/2020   PLT 283.0 09/15/2020   GLUCOSE 190 (H) 08/06/2021   CHOL 192 06/24/2017   TRIG 126.0 06/24/2017   HDL 37.00 (L) 06/24/2017   LDLDIRECT 120.0 05/13/2015   LDLCALC 130 (H) 06/24/2017   ALT 30 08/06/2021   AST 26 08/06/2021   NA 140 08/06/2021   K 3.4 (L) 08/06/2021   CL 106 08/06/2021   CREATININE 0.98 08/06/2021   BUN 13 08/06/2021   CO2 27 08/06/2021   TSH 1.04 05/05/2021   INR 0.9 10/13/2018   HGBA1C 7.0 (H) 08/06/2021   MICROALBUR 1.5 02/06/2016    MM 3D SCREEN BREAST BILATERAL  Result Date: 08/25/2021 CLINICAL DATA:  Screening. EXAM: DIGITAL SCREENING BILATERAL MAMMOGRAM WITH TOMOSYNTHESIS AND CAD TECHNIQUE: Bilateral screening digital craniocaudal and mediolateral oblique mammograms were obtained. Bilateral screening digital breast tomosynthesis was performed. The images were evaluated with computer-aided detection. COMPARISON:  Previous exam(s). ACR Breast Density Category b: There are scattered areas of fibroglandular density. FINDINGS: There are no findings suspicious for malignancy. IMPRESSION: No mammographic evidence of malignancy. A result letter of this screening mammogram will be mailed directly to the patient. RECOMMENDATION: Screening mammogram in one year. (Code:SM-B-01Y) BI-RADS CATEGORY  1: Negative. Electronically Signed   By: Abelardo Diesel M.D.   On: 08/25/2021 16:05    Assessment & Plan:   Problem List Items Addressed This Visit     Acute sinusitis    Recurrent Augmentin bid x 10 d       Relevant Medications   amoxicillin-clavulanate (AUGMENTIN) 875-125 MG tablet   Diabetes type 2, controlled ([place])    Diet controlled Re-start Rybelsus      Essential hypertension    Cont on Maxzide      Well adult exam    tDAP and Shingrix at the pharmacy due in 2023       tDAP and Shingrix at the pharmacy due in 2023  Meds ordered this encounter  Medications   amoxicillin-clavulanate (AUGMENTIN)  875-125 MG tablet    Sig: Take 1 tablet by mouth 2 (two) times daily.    Dispense:  20 tablet    Refill:  0      Follow-up: Return in about 3 months (around 01/02/2022) for a follow-up visit.  Walker Kehr, MD

## 2021-10-05 NOTE — Assessment & Plan Note (Addendum)
Diet controlled Re-start Rybelsus

## 2021-10-05 NOTE — Patient Instructions (Signed)
tDAP and Shingrix at the pharmacy

## 2021-11-10 ENCOUNTER — Ambulatory Visit: Payer: Medicare HMO | Admitting: Internal Medicine

## 2021-11-11 DIAGNOSIS — R87610 Atypical squamous cells of undetermined significance on cytologic smear of cervix (ASC-US): Secondary | ICD-10-CM | POA: Diagnosis not present

## 2021-11-11 DIAGNOSIS — B3731 Acute candidiasis of vulva and vagina: Secondary | ICD-10-CM | POA: Diagnosis not present

## 2021-11-11 DIAGNOSIS — Z1151 Encounter for screening for human papillomavirus (HPV): Secondary | ICD-10-CM | POA: Diagnosis not present

## 2021-11-11 DIAGNOSIS — Z6832 Body mass index (BMI) 32.0-32.9, adult: Secondary | ICD-10-CM | POA: Diagnosis not present

## 2021-11-11 DIAGNOSIS — Z124 Encounter for screening for malignant neoplasm of cervix: Secondary | ICD-10-CM | POA: Diagnosis not present

## 2021-12-03 ENCOUNTER — Ambulatory Visit: Payer: Medicare HMO | Admitting: Internal Medicine

## 2021-12-17 ENCOUNTER — Ambulatory Visit: Payer: Medicare HMO | Admitting: Internal Medicine

## 2021-12-31 ENCOUNTER — Other Ambulatory Visit: Payer: Self-pay | Admitting: *Deleted

## 2022-01-06 ENCOUNTER — Ambulatory Visit (INDEPENDENT_AMBULATORY_CARE_PROVIDER_SITE_OTHER): Payer: Medicare HMO | Admitting: Internal Medicine

## 2022-01-06 ENCOUNTER — Encounter: Payer: Self-pay | Admitting: Internal Medicine

## 2022-01-06 DIAGNOSIS — E119 Type 2 diabetes mellitus without complications: Secondary | ICD-10-CM | POA: Diagnosis not present

## 2022-01-06 DIAGNOSIS — E785 Hyperlipidemia, unspecified: Secondary | ICD-10-CM

## 2022-01-06 DIAGNOSIS — M159 Polyosteoarthritis, unspecified: Secondary | ICD-10-CM | POA: Diagnosis not present

## 2022-01-06 DIAGNOSIS — I10 Essential (primary) hypertension: Secondary | ICD-10-CM

## 2022-01-06 DIAGNOSIS — M2550 Pain in unspecified joint: Secondary | ICD-10-CM

## 2022-01-06 LAB — CBC WITH DIFFERENTIAL/PLATELET
Basophils Absolute: 0.1 10*3/uL (ref 0.0–0.1)
Basophils Relative: 0.8 % (ref 0.0–3.0)
Eosinophils Absolute: 0.1 10*3/uL (ref 0.0–0.7)
Eosinophils Relative: 1.6 % (ref 0.0–5.0)
HCT: 43.8 % (ref 36.0–46.0)
Hemoglobin: 13.5 g/dL (ref 12.0–15.0)
Lymphocytes Relative: 31.4 % (ref 12.0–46.0)
Lymphs Abs: 2.3 10*3/uL (ref 0.7–4.0)
MCHC: 30.8 g/dL (ref 30.0–36.0)
MCV: 75.9 fl — ABNORMAL LOW (ref 78.0–100.0)
Monocytes Absolute: 0.9 10*3/uL (ref 0.1–1.0)
Monocytes Relative: 11.7 % (ref 3.0–12.0)
Neutro Abs: 4 10*3/uL (ref 1.4–7.7)
Neutrophils Relative %: 54.5 % (ref 43.0–77.0)
Platelets: 315 10*3/uL (ref 150.0–400.0)
RBC: 5.77 Mil/uL — ABNORMAL HIGH (ref 3.87–5.11)
RDW: 14 % (ref 11.5–15.5)
WBC: 7.4 10*3/uL (ref 4.0–10.5)

## 2022-01-06 LAB — COMPREHENSIVE METABOLIC PANEL
ALT: 34 U/L (ref 0–35)
AST: 24 U/L (ref 0–37)
Albumin: 4.6 g/dL (ref 3.5–5.2)
Alkaline Phosphatase: 110 U/L (ref 39–117)
BUN: 20 mg/dL (ref 6–23)
CO2: 29 mEq/L (ref 19–32)
Calcium: 10.1 mg/dL (ref 8.4–10.5)
Chloride: 102 mEq/L (ref 96–112)
Creatinine, Ser: 1.15 mg/dL (ref 0.40–1.20)
GFR: 46.73 mL/min — ABNORMAL LOW (ref >60.00)
Glucose, Bld: 150 mg/dL — ABNORMAL HIGH (ref 70–99)
Potassium: 3.4 mEq/L — ABNORMAL LOW (ref 3.5–5.1)
Sodium: 140 mEq/L (ref 135–145)
Total Bilirubin: 0.7 mg/dL (ref 0.2–1.2)
Total Protein: 8.1 g/dL (ref 6.0–8.3)

## 2022-01-06 LAB — TSH: TSH: 1.61 u[IU]/mL (ref 0.35–5.50)

## 2022-01-06 LAB — HEMOGLOBIN A1C: Hgb A1c MFr Bld: 6.8 % — ABNORMAL HIGH (ref 4.6–6.5)

## 2022-01-06 NOTE — Assessment & Plan Note (Signed)
Cont on Maxzide 

## 2022-01-06 NOTE — Assessment & Plan Note (Signed)
Chronic sx's No Ibuprofen  On Celebrex prn

## 2022-01-06 NOTE — Assessment & Plan Note (Signed)
On Celebrex prn 

## 2022-01-06 NOTE — Progress Notes (Signed)
Subjective:  Patient ID: Monica Horn, female    DOB: [birth year]  Age: 76 y.o. MRN: [id number]  CC: Follow-up   HPI Natanya J Madero presents for DM, HTN, OA  Outpatient Medications Prior to Visit  Medication Sig Dispense Refill   celecoxib (CELEBREX) 200 MG capsule TAKE 1 CAPSULE EVERY DAY AS NEEDED 90 capsule 1   cetirizine (ZYRTEC) 10 MG tablet Take 1 tablet (10 mg total) by mouth daily as needed. 90 tablet 3   cholecalciferol (VITAMIN D3) 10 MCG (400 UNIT) TABS tablet Take 1,000 Units by mouth daily.     fluticasone (FLONASE) 50 MCG/ACT nasal spray Place 2 sprays into both nostrils daily. 16 g 6   furosemide (LASIX) 20 MG tablet TAKE 1 TO 2 TABLETS EVERY DAY 180 tablet 3   Multiple Vitamin (MULTIVITAMIN) tablet Take 1 tablet by mouth daily.     pantoprazole (PROTONIX) 40 MG tablet TAKE 1 TABLET EVERY DAY 90 tablet 3   triamterene-hydrochlorothiazide (MAXZIDE-25) 37.5-25 MG tablet TAKE 1 TABLET EVERY DAY 90 tablet 3   trolamine salicylate (ASPERCREME) 10 % cream Apply 1 application topically 2 (two) times daily as needed for muscle pain.     amoxicillin-clavulanate (AUGMENTIN) 875-125 MG tablet Take 1 tablet by mouth 2 (two) times daily. (Patient not taking: Reported on 01/06/2022) 20 tablet 0   Semaglutide (RYBELSUS) 3 MG TABS Rybelsus 3 mg tablet  TAKE 1 TABLET BY MOUTH ONCE DAILY     No facility-administered medications prior to visit.    ROS: Review of Systems  Constitutional:  Negative for activity change, appetite change, chills, fatigue and unexpected weight change.  HENT:  Negative for congestion, mouth sores and sinus pressure.   Eyes:  Negative for visual disturbance.  Respiratory:  Negative for cough and chest tightness.   Gastrointestinal:  Negative for abdominal pain and nausea.  Genitourinary:  Negative for difficulty urinating, frequency and vaginal pain.  Musculoskeletal:  Positive for arthralgias. Negative for back pain and gait problem.  Skin:  Negative for  pallor and rash.  Neurological:  Negative for dizziness, tremors, weakness, numbness and headaches.  Psychiatric/Behavioral:  Negative for confusion and sleep disturbance.    Objective:  BP 124/78 (BP Location: Left Arm, Patient Position: Sitting, Cuff Size: Normal)   Pulse (!) 102   Temp 97.8 F (36.6 C) (Oral)   Ht '5\' 1"'$  (1.549 m)   Wt 170 lb 9.6 oz (77.4 kg)   SpO2 97%   BMI 32.23 kg/m   BP Readings from Last 3 Encounters:  01/06/22 124/78  10/05/21 136/80  08/06/21 120/72    Wt Readings from Last 3 Encounters:  01/06/22 170 lb 9.6 oz (77.4 kg)  10/05/21 175 lb 2 oz (79.4 kg)  08/06/21 177 lb 3.2 oz (80.4 kg)    Physical Exam Constitutional:      Appearance: She is obese.  Musculoskeletal:     Right lower leg: Edema present.     Left lower leg: Edema present.    Lab Results  Component Value Date   WBC 6.4 09/15/2020   HGB 13.2 09/15/2020   HCT 41.2 09/15/2020   PLT 283.0 09/15/2020   GLUCOSE 190 (H) 08/06/2021   CHOL 192 06/24/2017   TRIG 126.0 06/24/2017   HDL 37.00 (L) 06/24/2017   LDLDIRECT 120.0 05/13/2015   LDLCALC 130 (H) 06/24/2017   ALT 30 08/06/2021   AST 26 08/06/2021   NA 140 08/06/2021   K 3.4 (L) 08/06/2021   CL 106 08/06/2021  CREATININE 0.98 08/06/2021   BUN 13 08/06/2021   CO2 27 08/06/2021   TSH 1.04 05/05/2021   INR 0.9 10/13/2018   HGBA1C 7.0 (H) 08/06/2021   MICROALBUR 1.5 02/06/2016    MM 3D SCREEN BREAST BILATERAL  Result Date: 08/25/2021 CLINICAL DATA:  Screening. EXAM: DIGITAL SCREENING BILATERAL MAMMOGRAM WITH TOMOSYNTHESIS AND CAD TECHNIQUE: Bilateral screening digital craniocaudal and mediolateral oblique mammograms were obtained. Bilateral screening digital breast tomosynthesis was performed. The images were evaluated with computer-aided detection. COMPARISON:  Previous exam(s). ACR Breast Density Category b: There are scattered areas of fibroglandular density. FINDINGS: There are no findings suspicious for malignancy.  IMPRESSION: No mammographic evidence of malignancy. A result letter of this screening mammogram will be mailed directly to the patient. RECOMMENDATION: Screening mammogram in one year. (Code:SM-B-01Y) BI-RADS CATEGORY  1: Negative. Electronically Signed   By: Abelardo Diesel M.D.   On: 08/25/2021 16:05    Assessment & Plan:   Problem List Items Addressed This Visit     Diabetes type 2, controlled ([place])    On Rybelsus      Relevant Medications   Semaglutide (RYBELSUS) 3 MG TABS   Other Relevant Orders   Hemoglobin A1c   Dyslipidemia    Statin intolerant On diet      Relevant Orders   CBC with Differential/Platelet   TSH   HTN (hypertension)    Cont on Maxzide      Relevant Orders   CBC with Differential/Platelet   Comprehensive metabolic panel   TSH   Osteoarthritis    Chronic sx's No Ibuprofen  On Celebrex prn      Arthralgia    On Celebrex prn      Relevant Orders   CBC with Differential/Platelet   Hemoglobin A1c   TSH      No orders of the defined types were placed in this encounter.     Follow-up: Return in about 4 months (around 05/09/2022) for a follow-up visit.  Walker Kehr, MD

## 2022-01-06 NOTE — Assessment & Plan Note (Signed)
Statin intolerant On diet

## 2022-01-06 NOTE — Assessment & Plan Note (Signed)
On Rybelsus 

## 2022-01-07 ENCOUNTER — Other Ambulatory Visit: Payer: Self-pay | Admitting: Internal Medicine

## 2022-01-07 MED ORDER — POTASSIUM CHLORIDE CRYS ER 20 MEQ PO TBCR: 20.0000 meq | EXTENDED_RELEASE_TABLET | Freq: Every day | ORAL | 3 refills | Status: DC

## 2022-04-09 ENCOUNTER — Telehealth: Payer: Self-pay | Admitting: Internal Medicine

## 2022-04-09 ENCOUNTER — Telehealth (INDEPENDENT_AMBULATORY_CARE_PROVIDER_SITE_OTHER): Payer: Medicare HMO | Admitting: Family Medicine

## 2022-04-09 DIAGNOSIS — R051 Acute cough: Secondary | ICD-10-CM | POA: Diagnosis not present

## 2022-04-09 DIAGNOSIS — R0981 Nasal congestion: Secondary | ICD-10-CM | POA: Diagnosis not present

## 2022-04-09 DIAGNOSIS — U071 COVID-19: Secondary | ICD-10-CM

## 2022-04-09 MED ORDER — MOLNUPIRAVIR 200 MG PO CAPS: 4.0000 | ORAL_CAPSULE | Freq: Two times a day (BID) | ORAL | 0 refills | Status: AC

## 2022-04-09 MED ORDER — MOLNUPIRAVIR 200 MG PO CAPS: 4.0000 | ORAL_CAPSULE | Freq: Two times a day (BID) | ORAL | 0 refills | Status: DC

## 2022-04-09 NOTE — Addendum Note (Signed)
Addended by: Girtha Rm on: 04/09/2022 10:42 AM   Modules accepted: Orders

## 2022-04-09 NOTE — Progress Notes (Signed)
MyChart Video Visit    Virtual Visit via Video Note   This visit type was conducted due to national recommendations for restrictions regarding the COVID-19 Pandemic (e.g. social distancing) in an effort to limit this patient's exposure and mitigate transmission in our community. This patient is at least at moderate risk for complications without adequate follow up. This format is felt to be most appropriate for this patient at this time. Physical exam was limited by quality of the video and audio technology used for the visit. CMA was able to get the patient set up on a video visit.  Patient location: Home. Patient and provider in visit Provider location: Office  I discussed the limitations of evaluation and management by telemedicine and the availability of in person appointments. The patient expressed understanding and agreed to proceed.  Visit Date: 04/09/2022  Today's healthcare provider: Harland Dingwall, NP-C     Subjective:    Patient ID: Monica Horn, female    DOB: 05-11-1946, 76 y.o.   MRN: [id number]  Chief Complaint  Patient presents with   Covid Positive    Tested positive 8/24 Sx include: runny nose, sneezing and cough  Has been using tylenol     HPI  Positive Covid test yesterday as well as symptom onset.  Fatigue, rhinorrhea, nasal congestion, sneezing and mild cough.   Taking Tylenol, Zyrtec and Flonase.   Denies fever, chills, dizziness, headache, sore throat, chest pain, palpitations, shortness of breath, abdominal pain, N/V/D, LE edema.   She had Covid in 2020.    Past Medical History:  Diagnosis Date   Abnormal EKG 2014   NONSPECIFIC ST DEPRESSION + T ANORMALITY, WORKED UP 2014 DR Crissie Sickles AND RELEASED BY DR Lovena Le   Allergy    Arthritis    Blood transfusion without reported diagnosis    Colon polyps    Diabetes mellitus    PT. 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 liver ca   Alcohol abuse Father    Hypertension Other    Stroke Other    Colon cancer Neg Hx    Colon polyps Neg Hx    Rectal cancer Neg Hx    Stomach cancer Neg Hx     Social History   Socioeconomic History   Marital status: Married    Spouse name: Not on file   Number of children: Not on file   Years of education: Not on file   Highest education level: Not  on file  Occupational History   Not on file  Tobacco Use   Smoking status: Former    Types: Cigarettes    Quit date: 1979    Years since quitting: 44.6   Smokeless tobacco: Never  Vaping Use   Vaping Use: Never used  Substance and Sexual Activity   Alcohol use: Yes    Comment: occasional   Drug use: No   Sexual activity: Yes  Other Topics Concern   Not on file  Social History Narrative   Not on file   Social Determinants of Health   Financial Resource Strain: Low Risk  (01/23/2021)   Overall Financial Resource Strain (CARDIA)    Difficulty of Paying Living Expenses: Not hard at all  Food Insecurity: No Food Insecurity (01/23/2021)   Hunger Vital Sign    Worried About Running Out of Food in the Last Year: Never true    Ran Out of Food in the Last Year: Never true   Transportation Needs: No Transportation Needs (01/23/2021)   PRAPARE - Hydrologist (Medical): No    Lack of Transportation (Non-Medical): No  Physical Activity: Sufficiently Active (01/23/2021)   Exercise Vital Sign    Days of Exercise per Week: 5 days    Minutes of Exercise per Session: 50 min  Stress: No Stress Concern Present (01/23/2021)   [place]    Feeling of Stress : Not at all  Social Connections: [place] (01/23/2021)   Social Connection and Isolation Panel [NHANES]    Frequency of Communication with Friends and Family: More than three times a week    Frequency of Social Gatherings with Friends and Family: More than three times a week    Attends Religious Services: More than 4 times per year    Active Member of [employer] or Organizations: Yes    Attends Archivist Meetings: More than 4 times per year    Marital Status: Married  Human resources officer Violence: Not At Risk (01/23/2021)   Humiliation, Afraid, Rape, and Kick questionnaire    Fear of Current or Ex-Partner: No    Emotionally Abused: No    Physically Abused: No    Sexually Abused: No    Outpatient Medications Prior to Visit  Medication Sig Dispense Refill   celecoxib (CELEBREX) 200 MG capsule TAKE 1 CAPSULE EVERY DAY AS NEEDED 90 capsule 1   cetirizine (ZYRTEC) 10 MG tablet Take 1 tablet (10 mg total) by mouth daily as needed. 90 tablet 3   cholecalciferol (VITAMIN D3) 10 MCG (400 UNIT) TABS tablet Take 1,000 Units by mouth daily.     fluticasone (FLONASE) 50 MCG/ACT nasal spray Place 2 sprays into both nostrils daily. 16 g 6   furosemide (LASIX) 20 MG tablet TAKE 1 TO 2 TABLETS EVERY DAY 180 tablet 3   Multiple Vitamin (MULTIVITAMIN) tablet Take 1 tablet by mouth daily.     pantoprazole (PROTONIX) 40 MG tablet TAKE 1 TABLET EVERY DAY 90 tablet 3   potassium chloride SA (KLOR-CON M) 20 MEQ tablet Take 1  tablet (20 mEq total) by mouth daily. 90 tablet 3   Semaglutide (RYBELSUS) 3 MG TABS Rybelsus 3 mg tablet  TAKE 1 TABLET BY MOUTH ONCE DAILY     triamterene-hydrochlorothiazide (MAXZIDE-25) 37.5-25 MG tablet TAKE 1 TABLET EVERY DAY 90 tablet 3   trolamine salicylate (ASPERCREME) 10 % cream Apply 1 application topically 2 (two) times daily as needed for muscle  pain.     No facility-administered medications prior to visit.    Allergies  Allergen Reactions   Amlodipine     Leg swelling   Ibuprofen     gastritis   Benazepril Cough   Losartan Other (See Comments)    Headache   Lovastatin     myalgia    ROS     Objective:    Physical Exam  There were no vitals taken for this visit. Wt Readings from Last 3 Encounters:  01/06/22 170 lb 9.6 oz (77.4 kg)  10/05/21 175 lb 2 oz (79.4 kg)  08/06/21 177 lb 3.2 oz (80.4 kg)   Alert and oriented and in no acute distress.  Respirations unlabored.  Speaking in complete sentences without difficulty.  Normal speech and mood.    Assessment & Plan:   Problem List Items Addressed This Visit       Other   Cough   Other Visit Diagnoses     COVID-19 virus infection    -  Primary   Relevant Medications   molnupiravir EUA (LAGEVRIO) 200 MG CAPS capsule   Nasal congestion          She is not in any acute distress.  Symptom onset and positive test yesterday. Molnupiravir prescribed due to decreased GFR.  Counseling on how to take the medications as well as possible side effects. Discussed symptomatic management. Counseling on CDC guidelines for quarantine and isolation. Follow-up if worsening   I am having Yazlin J. Quinonez start on molnupiravir EUA. I am also having her maintain her trolamine salicylate, multivitamin, cholecalciferol, cetirizine, fluticasone, furosemide, celecoxib, triamterene-hydrochlorothiazide, pantoprazole, Rybelsus, and potassium chloride SA.  Meds ordered this encounter  Medications   molnupiravir EUA  (LAGEVRIO) 200 MG CAPS capsule    Sig: Take 4 capsules (800 mg total) by mouth 2 (two) times daily for 5 days.    Dispense:  40 capsule    Refill:  0    Order Specific Question:   Supervising Provider    Answer:   Pricilla Holm A [[id number]]    I discussed the assessment and treatment plan with the patient. The patient was provided an opportunity to ask questions and all were answered. The patient agreed with the plan and demonstrated an understanding of the instructions.   The patient was advised to call back or seek an in-person evaluation if the symptoms worsen or if the condition fails to improve as anticipated.  I provided 17 minutes of face-to-face time during this encounter.   Harland Dingwall, NP-C [facility] [place] [phone] (phone) [phone] (fax)  [place]

## 2022-04-09 NOTE — Telephone Encounter (Signed)
[facility] states they are out of molnupiravir EUA (LAGEVRIO) 200 MG CAPS capsule and cannot fill the RX  Pt is requesting we send the RX to another [facility]. Please send the RX to:  [id number]  PJSUN:[phone]

## 2022-04-21 DIAGNOSIS — G8929 Other chronic pain: Secondary | ICD-10-CM | POA: Insufficient documentation

## 2022-04-21 HISTORY — DX: Other chronic pain: G89.29

## 2022-04-22 DIAGNOSIS — M1711 Unilateral primary osteoarthritis, right knee: Secondary | ICD-10-CM | POA: Diagnosis not present

## 2022-04-22 DIAGNOSIS — M25561 Pain in right knee: Secondary | ICD-10-CM | POA: Diagnosis not present

## 2022-04-25 DIAGNOSIS — M1711 Unilateral primary osteoarthritis, right knee: Secondary | ICD-10-CM | POA: Insufficient documentation

## 2022-04-25 HISTORY — DX: Unilateral primary osteoarthritis, right knee: M17.11

## 2022-05-10 ENCOUNTER — Telehealth: Payer: Self-pay | Admitting: Internal Medicine

## 2022-05-10 ENCOUNTER — Ambulatory Visit: Payer: Medicare HMO | Admitting: Internal Medicine

## 2022-05-10 NOTE — Telephone Encounter (Signed)
PT calls today in regards to missing their appointment today (09/25). PT was wanting to know if Dr.Plotnikov would be okay with seeing her at the same time as her spouse Ellon Marasco, on 09/27 at 2:20. I had let PT know this would require an overbook and approval from Dr.Plotnikov. PT stated she wanted to speak with Lorre Nick when she was available.   CB: [phone]

## 2022-05-12 ENCOUNTER — Encounter: Payer: Self-pay | Admitting: Internal Medicine

## 2022-05-12 ENCOUNTER — Ambulatory Visit (INDEPENDENT_AMBULATORY_CARE_PROVIDER_SITE_OTHER): Payer: Medicare HMO | Admitting: Internal Medicine

## 2022-05-12 VITALS — BP 146/78 | HR 87 | Temp 98.8°F | Ht 61.0 in | Wt 169.8 lb

## 2022-05-12 DIAGNOSIS — Z23 Encounter for immunization: Secondary | ICD-10-CM | POA: Diagnosis not present

## 2022-05-12 DIAGNOSIS — R413 Other amnesia: Secondary | ICD-10-CM | POA: Diagnosis not present

## 2022-05-12 DIAGNOSIS — I10 Essential (primary) hypertension: Secondary | ICD-10-CM

## 2022-05-12 DIAGNOSIS — E119 Type 2 diabetes mellitus without complications: Secondary | ICD-10-CM | POA: Diagnosis not present

## 2022-05-12 DIAGNOSIS — M159 Polyosteoarthritis, unspecified: Secondary | ICD-10-CM | POA: Diagnosis not present

## 2022-05-12 LAB — COMPREHENSIVE METABOLIC PANEL
ALT: 29 U/L (ref 0–35)
AST: 22 U/L (ref 0–37)
Albumin: 4.5 g/dL (ref 3.5–5.2)
Alkaline Phosphatase: 104 U/L (ref 39–117)
BUN: 18 mg/dL (ref 6–23)
CO2: 31 mEq/L (ref 19–32)
Calcium: 10 mg/dL (ref 8.4–10.5)
Chloride: 100 mEq/L (ref 96–112)
Creatinine, Ser: 1.25 mg/dL — ABNORMAL HIGH (ref 0.40–1.20)
GFR: 42.17 mL/min — ABNORMAL LOW (ref >60.00)
Glucose, Bld: 232 mg/dL — ABNORMAL HIGH (ref 70–99)
Potassium: 3.4 mEq/L — ABNORMAL LOW (ref 3.5–5.1)
Sodium: 136 mEq/L (ref 135–145)
Total Bilirubin: 0.5 mg/dL (ref 0.2–1.2)
Total Protein: 8.2 g/dL (ref 6.0–8.3)

## 2022-05-12 LAB — HEMOGLOBIN A1C: Hgb A1c MFr Bld: 8.1 % — ABNORMAL HIGH (ref 4.6–6.5)

## 2022-05-12 NOTE — Telephone Encounter (Signed)
Okay, if it is not a complicated visit.  Thank you

## 2022-05-12 NOTE — Progress Notes (Signed)
Subjective:  Patient ID: Monica Horn, female    DOB: [birth year]  Age: 76 y.o. MRN: [id number]  CC: Follow-up (3 month f/u- Flu shot)   HPI Monica Horn presents for recent COVID, HTN, DM C/o memory problems  Outpatient Medications Prior to Visit  Medication Sig Dispense Refill   celecoxib (CELEBREX) 200 MG capsule TAKE 1 CAPSULE EVERY DAY AS NEEDED 90 capsule 1   cetirizine (ZYRTEC) 10 MG tablet Take 1 tablet (10 mg total) by mouth daily as needed. 90 tablet 3   cholecalciferol (VITAMIN D3) 10 MCG (400 UNIT) TABS tablet Take 1,000 Units by mouth daily.     fluticasone (FLONASE) 50 MCG/ACT nasal spray Place 2 sprays into both nostrils daily. 16 g 6   furosemide (LASIX) 20 MG tablet TAKE 1 TO 2 TABLETS EVERY DAY 180 tablet 3   Multiple Vitamin (MULTIVITAMIN) tablet Take 1 tablet by mouth daily.     pantoprazole (PROTONIX) 40 MG tablet TAKE 1 TABLET EVERY DAY 90 tablet 3   potassium chloride SA (KLOR-CON M) 20 MEQ tablet Take 1 tablet (20 mEq total) by mouth daily. 90 tablet 3   Semaglutide (RYBELSUS) 3 MG TABS Rybelsus 3 mg tablet  TAKE 1 TABLET BY MOUTH ONCE DAILY     triamterene-hydrochlorothiazide (MAXZIDE-25) 37.5-25 MG tablet TAKE 1 TABLET EVERY DAY 90 tablet 3   trolamine salicylate (ASPERCREME) 10 % cream Apply 1 application topically 2 (two) times daily as needed for muscle pain.     No facility-administered medications prior to visit.    ROS: Review of Systems  Constitutional:  Negative for activity change, appetite change, chills, fatigue and unexpected weight change.  HENT:  Negative for congestion, mouth sores and sinus pressure.   Eyes:  Negative for visual disturbance.  Respiratory:  Negative for cough and chest tightness.   Gastrointestinal:  Negative for abdominal pain and nausea.  Genitourinary:  Negative for difficulty urinating, frequency and vaginal pain.  Musculoskeletal:  Negative for back pain and gait problem.  Skin:  Negative for pallor and rash.   Neurological:  Negative for dizziness, tremors, weakness, numbness and headaches.  Psychiatric/Behavioral:  Negative for confusion, sleep disturbance and suicidal ideas. The patient is not nervous/anxious.     Objective:  BP (!) 146/78 (BP Location: Left Arm)   Pulse 87   Temp 98.8 F (37.1 C) (Oral)   Ht '5\' 1"'$  (1.549 m)   Wt 169 lb 12.8 oz (77 kg)   SpO2 98%   BMI 32.08 kg/m   BP Readings from Last 3 Encounters:  05/12/22 (!) 146/78  01/06/22 124/78  10/05/21 136/80    Wt Readings from Last 3 Encounters:  05/12/22 169 lb 12.8 oz (77 kg)  01/06/22 170 lb 9.6 oz (77.4 kg)  10/05/21 175 lb 2 oz (79.4 kg)    Physical Exam Constitutional:      General: She is not in acute distress.    Appearance: She is well-developed.  HENT:     Head: Normocephalic.     Right Ear: External ear normal.     Left Ear: External ear normal.     Nose: Nose normal.  Eyes:     General:        Right eye: No discharge.        Left eye: No discharge.     Conjunctiva/sclera: Conjunctivae normal.     Pupils: Pupils are equal, round, and reactive to light.  Neck:     Thyroid: No thyromegaly.  Vascular: No JVD.     Trachea: No tracheal deviation.  Cardiovascular:     Rate and Rhythm: Normal rate and regular rhythm.     Heart sounds: Normal heart sounds.  Pulmonary:     Effort: No respiratory distress.     Breath sounds: No stridor. No wheezing.  Abdominal:     General: Bowel sounds are normal. There is no distension.     Palpations: Abdomen is soft. There is no mass.     Tenderness: There is no abdominal tenderness. There is no guarding or rebound.  Musculoskeletal:        General: No tenderness.     Cervical back: Normal range of motion and neck supple. No rigidity.  Lymphadenopathy:     Cervical: No cervical adenopathy.  Skin:    Findings: No erythema or rash.  Neurological:     Mental Status: She is oriented to person, place, and time.     Cranial Nerves: No cranial nerve  deficit.     Motor: No abnormal muscle tone.     Coordination: Coordination normal.     Deep Tendon Reflexes: Reflexes normal.  Psychiatric:        Behavior: Behavior normal.        Thought Content: Thought content normal.        Judgment: Judgment normal.   A/o/c  Lab Results  Component Value Date   WBC 7.4 01/06/2022   HGB 13.5 01/06/2022   HCT 43.8 01/06/2022   PLT 315.0 01/06/2022   GLUCOSE 232 (H) 05/12/2022   CHOL 192 06/24/2017   TRIG 126.0 06/24/2017   HDL 37.00 (L) 06/24/2017   LDLDIRECT 120.0 05/13/2015   LDLCALC 130 (H) 06/24/2017   ALT 29 05/12/2022   AST 22 05/12/2022   NA 136 05/12/2022   K 3.4 (L) 05/12/2022   CL 100 05/12/2022   CREATININE 1.25 (H) 05/12/2022   BUN 18 05/12/2022   CO2 31 05/12/2022   TSH 1.61 01/06/2022   INR 0.9 10/13/2018   HGBA1C 8.1 (H) 05/12/2022   MICROALBUR 1.5 02/06/2016    MM 3D SCREEN BREAST BILATERAL  Result Date: 08/25/2021 CLINICAL DATA:  Screening. EXAM: DIGITAL SCREENING BILATERAL MAMMOGRAM WITH TOMOSYNTHESIS AND CAD TECHNIQUE: Bilateral screening digital craniocaudal and mediolateral oblique mammograms were obtained. Bilateral screening digital breast tomosynthesis was performed. The images were evaluated with computer-aided detection. COMPARISON:  Previous exam(s). ACR Breast Density Category b: There are scattered areas of fibroglandular density. FINDINGS: There are no findings suspicious for malignancy. IMPRESSION: No mammographic evidence of malignancy. A result letter of this screening mammogram will be mailed directly to the patient. RECOMMENDATION: Screening mammogram in one year. (Code:[id number]-B-01Y) BI-RADS CATEGORY  1: Negative. Electronically Signed   By: Abelardo Diesel M.D.   On: 08/25/2021 16:05    Assessment & Plan:   Problem List Items Addressed This Visit     Diabetes type 2, controlled ([place])    Re-start Rybelsus - use Assistance program      Relevant Orders   Hemoglobin A1c (Completed)   Comprehensive  metabolic panel (Completed)   HTN (hypertension) - Primary    Cont on Maxzide      Memory problem    It appears that the injury is suffering with mild cognitive deficit.  She does a lot of work at [employer].  I suggested that she cuts back on her activities and rest more. She would like to be referred to neurology and have a formal memory evaluation done.  Relevant Orders   Ambulatory referral to Neurology   Comprehensive metabolic panel (Completed)   Osteoarthritis    On Celebrex prn      Other Visit Diagnoses     Needs flu shot       Relevant Orders   Flu Vaccine QUAD High Dose(Fluad) (Completed)         No orders of the defined types were placed in this encounter.     Follow-up: Return in about 3 months (around 08/11/2022).  Walker Kehr, MD

## 2022-05-12 NOTE — Assessment & Plan Note (Signed)
Re-start Rybelsus - use Assistance program

## 2022-05-12 NOTE — Telephone Encounter (Signed)
Called pt no answer LMOM w/MD response. Add to schedule at 2:20.Marland KitchenJohny Horn

## 2022-05-12 NOTE — Assessment & Plan Note (Signed)
Cont on Maxzide 

## 2022-05-12 NOTE — Patient Instructions (Signed)
Try lions mane mushroom for memory

## 2022-05-12 NOTE — Assessment & Plan Note (Signed)
On Celebrex prn 

## 2022-05-16 DIAGNOSIS — R413 Other amnesia: Secondary | ICD-10-CM | POA: Insufficient documentation

## 2022-05-16 NOTE — Assessment & Plan Note (Signed)
It appears that the injury is suffering with mild cognitive deficit.  She does a lot of work at [employer].  I suggested that she cuts back on her activities and rest more. She would like to be referred to neurology and have a formal memory evaluation done.

## 2022-05-17 ENCOUNTER — Other Ambulatory Visit: Payer: Self-pay | Admitting: Internal Medicine

## 2022-05-17 MED ORDER — REPAGLINIDE 1 MG PO TABS: 1.0000 mg | ORAL_TABLET | Freq: Three times a day (TID) | ORAL | 5 refills | Status: DC

## 2022-06-07 ENCOUNTER — Telehealth: Payer: Self-pay | Admitting: Internal Medicine

## 2022-06-07 NOTE — Telephone Encounter (Signed)
LVM for pt to rtn my call to schedule AWV with [facility] [phone] 

## 2022-06-08 ENCOUNTER — Telehealth: Payer: Self-pay | Admitting: Internal Medicine

## 2022-06-08 NOTE — Telephone Encounter (Signed)
Made in error

## 2022-06-15 ENCOUNTER — Ambulatory Visit (INDEPENDENT_AMBULATORY_CARE_PROVIDER_SITE_OTHER): Payer: Medicare HMO

## 2022-06-15 VITALS — BP 120/70 | HR 97 | Temp 97.8°F | Resp 16 | Ht 61.0 in | Wt 174.6 lb

## 2022-06-15 DIAGNOSIS — Z0000 Encounter for general adult medical examination without abnormal findings: Secondary | ICD-10-CM

## 2022-06-15 NOTE — Patient Instructions (Addendum)
Monica Horn , Thank you for taking time to come for your Medicare Wellness Visit. I appreciate your ongoing commitment to your health goals. Please review the following plan we discussed and let me know if I can assist you in the future.   These are the goals we discussed:  Goals      My goal is to travel more and defiently go on a cruise.        This is a list of the screening recommended for you and due dates:  Health Maintenance  Topic Date Due   Zoster (Shingles) Vaccine (1 of 2) Never done   Yearly kidney health urinalysis for diabetes  02/05/2017   Tetanus Vaccine  02/01/2019   Complete foot exam   09/16/2019   COVID-19 Vaccine (5 - Pfizer series) 03/19/2021   Eye exam for diabetics  01/23/2022   Hemoglobin A1C  11/10/2022   Yearly kidney function blood test for diabetes  05/13/2023   Medicare Annual Wellness Visit  06/16/2023   Colon Cancer Screening  11/05/2023   Pneumonia Vaccine  Completed   Flu Shot  Completed   DEXA scan (bone density measurement)  Completed   Hepatitis C Screening: USPSTF Recommendation to screen - Ages 50-79 yo.  Completed   HPV Vaccine  Aged Out    Advanced directives: Yes  Conditions/risks identified: Yes; Type II Diabetes Mellitus  Next appointment: Follow up in one year for your annual wellness visit.   Preventive Care 11 Years and Older, Female Preventive care refers to lifestyle choices and visits with your health care provider that can promote health and wellness. What does preventive care include? A yearly physical exam. This is also called an annual well check. Dental exams once or twice a year. Routine eye exams. Ask your health care provider how often you should have your eyes checked. Personal lifestyle choices, including: Daily care of your teeth and gums. Regular physical activity. Eating a healthy diet. Avoiding tobacco and drug use. Limiting alcohol use. Practicing safe sex. Taking low-dose aspirin every day. Taking  vitamin and mineral supplements as recommended by your health care provider. What happens during an annual well check? The services and screenings done by your health care provider during your annual well check will depend on your age, overall health, lifestyle risk factors, and family history of disease. Counseling  Your health care provider may ask you questions about your: Alcohol use. Tobacco use. Drug use. Emotional well-being. Home and relationship well-being. Sexual activity. Eating habits. History of falls. Memory and ability to understand (cognition). Work and work Statistician. Reproductive health. Screening  You may have the following tests or measurements: Height, weight, and BMI. Blood pressure. Lipid and cholesterol levels. These may be checked every 5 years, or more frequently if you are over 11 years old. Skin check. Lung cancer screening. You may have this screening every year starting at age 6 if you have a 30-pack-year history of smoking and currently smoke or have quit within the past 15 years. Fecal occult blood test (FOBT) of the stool. You may have this test every year starting at age 92. Flexible sigmoidoscopy or colonoscopy. You may have a sigmoidoscopy every 5 years or a colonoscopy every 10 years starting at age 47. Hepatitis C blood test. Hepatitis B blood test. Sexually transmitted disease (STD) testing. Diabetes screening. This is done by checking your blood sugar (glucose) after you have not eaten for a while (fasting). You may have this done every 1-3 years. Bone  density scan. This is done to screen for osteoporosis. You may have this done starting at age 45. Mammogram. This may be done every 1-2 years. Talk to your health care provider about how often you should have regular mammograms. Talk with your health care provider about your test results, treatment options, and if necessary, the need for more tests. Vaccines  Your health care provider may  recommend certain vaccines, such as: Influenza vaccine. This is recommended every year. Tetanus, diphtheria, and acellular pertussis (Tdap, Td) vaccine. You may need a Td booster every 10 years. Zoster vaccine. You may need this after age 26. Pneumococcal 13-valent conjugate (PCV13) vaccine. One dose is recommended after age 73. Pneumococcal polysaccharide (PPSV23) vaccine. One dose is recommended after age 73. Talk to your health care provider about which screenings and vaccines you need and how often you need them. This information is not intended to replace advice given to you by your health care provider. Make sure you discuss any questions you have with your health care provider. Document Released: 08/29/2015 Document Revised: 04/21/2016 Document Reviewed: 06/03/2015 Elsevier Interactive Patient Education  2017 [employer] Prevention in the Home Falls can cause injuries. They can happen to people of all ages. There are many things you can do to make your home safe and to help prevent falls. What can I do on the outside of my home? Regularly fix the edges of walkways and driveways and fix any cracks. Remove anything that might make you trip as you walk through a door, such as a raised step or threshold. Trim any bushes or trees on the path to your home. Use bright outdoor lighting. Clear any walking paths of anything that might make someone trip, such as rocks or tools. Regularly check to see if handrails are loose or broken. Make sure that both sides of any steps have handrails. Any raised decks and porches should have guardrails on the edges. Have any leaves, snow, or ice cleared regularly. Use sand or salt on walking paths during winter. Clean up any spills in your garage right away. This includes oil or grease spills. What can I do in the bathroom? Use night lights. Install grab bars by the toilet and in the tub and shower. Do not use towel bars as grab bars. Use non-skid  mats or decals in the tub or shower. If you need to sit down in the shower, use a plastic, non-slip stool. Keep the floor dry. Clean up any water that spills on the floor as soon as it happens. Remove soap buildup in the tub or shower regularly. Attach bath mats securely with double-sided non-slip rug tape. Do not have throw rugs and other things on the floor that can make you trip. What can I do in the bedroom? Use night lights. Make sure that you have a light by your bed that is easy to reach. Do not use any sheets or blankets that are too big for your bed. They should not hang down onto the floor. Have a firm chair that has side arms. You can use this for support while you get dressed. Do not have throw rugs and other things on the floor that can make you trip. What can I do in the kitchen? Clean up any spills right away. Avoid walking on wet floors. Keep items that you use a lot in easy-to-reach places. If you need to reach something above you, use a strong step stool that has a grab bar. Keep  electrical cords out of the way. Do not use floor polish or wax that makes floors slippery. If you must use wax, use non-skid floor wax. Do not have throw rugs and other things on the floor that can make you trip. What can I do with my stairs? Do not leave any items on the stairs. Make sure that there are handrails on both sides of the stairs and use them. Fix handrails that are broken or loose. Make sure that handrails are as long as the stairways. Check any carpeting to make sure that it is firmly attached to the stairs. Fix any carpet that is loose or worn. Avoid having throw rugs at the top or bottom of the stairs. If you do have throw rugs, attach them to the floor with carpet tape. Make sure that you have a light switch at the top of the stairs and the bottom of the stairs. If you do not have them, ask someone to add them for you. What else can I do to help prevent falls? Wear shoes  that: Do not have high heels. Have rubber bottoms. Are comfortable and fit you well. Are closed at the toe. Do not wear sandals. If you use a stepladder: Make sure that it is fully opened. Do not climb a closed stepladder. Make sure that both sides of the stepladder are locked into place. Ask someone to hold it for you, if possible. Clearly mark and make sure that you can see: Any grab bars or handrails. First and last steps. Where the edge of each step is. Use tools that help you move around (mobility aids) if they are needed. These include: Canes. Walkers. Scooters. Crutches. Turn on the lights when you go into a dark area. Replace any light bulbs as soon as they burn out. Set up your furniture so you have a clear path. Avoid moving your furniture around. If any of your floors are uneven, fix them. If there are any pets around you, be aware of where they are. Review your medicines with your doctor. Some medicines can make you feel dizzy. This can increase your chance of falling. Ask your doctor what other things that you can do to help prevent falls. This information is not intended to replace advice given to you by your health care provider. Make sure you discuss any questions you have with your health care provider. Document Released: 05/29/2009 Document Revised: 01/08/2016 Document Reviewed: 09/06/2014 Elsevier Interactive Patient Education  2017 [employer].

## 2022-06-15 NOTE — Progress Notes (Addendum)
Subjective:   Monica Horn is a 76 y.o. female who presents for Medicare Annual (Subsequent) preventive examination.  Review of Systems     Cardiac Risk Factors include: advanced age (>56mn, >>54women);diabetes mellitus;dyslipidemia;family history of premature cardiovascular disease;hypertension;obesity (BMI >30kg/m2)     Objective:    Today's Vitals   06/15/22 1343  BP: 120/70  Pulse: 97  Resp: 16  Temp: 97.8 F (36.6 C)  SpO2: 99%  Weight: 174 lb 9.6 oz (79.2 kg)  Height: '5\' 1"'$  (1.549 m)  PainSc: 0-No pain   Body mass index is 32.99 kg/m.     06/15/2022    2:51 PM 01/23/2021    2:39 PM 11/20/2019    2:04 PM 10/18/2018    2:00 PM 10/18/2018    8:07 AM 10/13/2018   10:36 AM 09/15/2018   11:39 AM  Advanced Directives  Does Patient Have a Medical Advance Directive? Yes No No No No Yes;No No  Type of AParamedicof ANew[place]Living will        Does patient want to make changes to medical advance directive?    No - Patient declined No - Patient declined No - Patient declined   Copy of [place] Chart? No - copy requested        Would patient like information on creating a medical advance directive?  Yes (MAU/Ambulatory/Procedural Areas - Information given) Yes (MAU/Ambulatory/Procedural Areas - Information given) No - Patient declined No - Patient declined No - Patient declined No - Patient declined    Current Medications (verified) Outpatient Encounter Medications as of 06/15/2022  Medication Sig   celecoxib (CELEBREX) 200 MG capsule TAKE 1 CAPSULE EVERY DAY AS NEEDED   cetirizine (ZYRTEC) 10 MG tablet Take 1 tablet (10 mg total) by mouth daily as needed.   cholecalciferol (VITAMIN D3) 10 MCG (400 UNIT) TABS tablet Take 1,000 Units by mouth daily.   fluticasone (FLONASE) 50 MCG/ACT nasal spray Place 2 sprays into both nostrils daily.   furosemide (LASIX) 20 MG tablet TAKE 1 TO 2 TABLETS EVERY DAY   Multiple Vitamin  (MULTIVITAMIN) tablet Take 1 tablet by mouth daily.   pantoprazole (PROTONIX) 40 MG tablet TAKE 1 TABLET EVERY DAY   potassium chloride SA (KLOR-CON M) 20 MEQ tablet Take 1 tablet (20 mEq total) by mouth daily.   repaglinide (PRANDIN) 1 MG tablet Take 1 tablet (1 mg total) by mouth 3 (three) times daily before meals.   Semaglutide (RYBELSUS) 3 MG TABS Rybelsus 3 mg tablet  TAKE 1 TABLET BY MOUTH ONCE DAILY   triamterene-hydrochlorothiazide (MAXZIDE-25) 37.5-25 MG tablet TAKE 1 TABLET EVERY DAY   trolamine salicylate (ASPERCREME) 10 % cream Apply 1 application topically 2 (two) times daily as needed for muscle pain.   No facility-administered encounter medications on file as of 06/15/2022.    Allergies (verified) Amlodipine, Ibuprofen, Benazepril, Losartan, and Lovastatin   History: Past Medical History:  Diagnosis Date   Abnormal EKG 2014   NONSPECIFIC ST DEPRESSION + T ANORMALITY, WORKED UP 2014 DR GCrissie SicklesAND RELEASED BY DR TLovena Le  Allergy    Arthritis    Blood transfusion without reported diagnosis    Colon polyps    Diabetes mellitus    PT. 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 liver ca   Alcohol abuse Father    Hypertension Other    Stroke Other    Colon cancer Neg Hx    Colon polyps Neg Hx    Rectal cancer Neg Hx    Stomach cancer Neg Hx    Social History   Socioeconomic History   Marital status: Married    Spouse name: Not on file   Number of children: Not on file   Years of education: Not on file   Highest education level: Not on file  Occupational History   Not on file  Tobacco Use   Smoking status: Former    Types: Cigarettes    Quit date: 140   Years since quitting: 44.8   Smokeless tobacco: Never  Vaping Use   Vaping Use: Never used  Substance and Sexual Activity   Alcohol use: Yes    Comment: occasional   Drug use: No   Sexual activity: Yes  Other Topics Concern   Not on file  Social History Narrative   Not on file   Social Determinants of Health   Financial Resource Strain: Low Risk  (06/15/2022)   Overall Financial Resource Strain (CARDIA)    Difficulty of Paying Living Expenses: Not hard at all  Food Insecurity: No Food Insecurity (06/15/2022)   Hunger Vital Sign    Worried About Running Out of Food in the Last Year: Never true    Ran Out of Food in the Last Year: Never true  Transportation Needs: No Transportation Needs (06/15/2022)   PRAPARE - THydrologist(Medical): No    Lack of Transportation (Non-Medical): No  Physical Activity: Sufficiently Active (06/15/2022)   Exercise Vital Sign    Days of Exercise per Week: 5 days    Minutes of Exercise per Session: 50 min  Stress: No Stress Concern Present (06/15/2022)   [place]   Feeling of Stress : Not at all  Social Connections: [place](06/15/2022)   Social Connection and Isolation Panel [NHANES]    Frequency of Communication with Friends and Family: More than three times a week    Frequency of Social Gatherings with Friends and Family: More than  three times a week    Attends Religious Services: More than 4 times per year    Active Member of [employer] Organizations: Yes    Attends CMusic therapist More than 4 times per year    Marital Status: Married    Tobacco Counseling Counseling given: Not Answered   Clinical Intake:  Pre-visit preparation completed: Yes  Pain : No/denies pain Pain Score:  0-No pain     BMI - recorded: 32.99 Nutritional Status: BMI > 30  Obese Nutritional Risks: None Diabetes: Yes  How often do you need to have someone help you when you read instructions, pamphlets, or other written materials from your doctor or pharmacy?: 1 - Never What is the last grade level you completed in school?: HSG  Nutrition Risk Assessment:  Has the patient had any N/V/D within the last 2 months?  No  Does the patient have any non-healing wounds?  No  Has the patient had any unintentional weight loss or weight gain?  No   Diabetes:  Is the patient diabetic?  Yes  If diabetic, was a CBG obtained today?  No  Did the patient bring in their glucometer from home?  No  How often do you monitor your CBG's? none.   Financial Strains and Diabetes Management:  Are you having any financial strains with the device, your supplies or your medication? No .  Does the patient want to be seen by Chronic Care Management for management of their diabetes?  No  Would the patient like to be referred to a Nutritionist or for Diabetic Management?  No   Diabetic Exams:  Diabetic Eye Exam: Completed 09/30/2021 Diabetic Foot Exam: Completed 09/15/2018   Interpreter Needed?: No  Information entered by :: Lisette Abu, LPN.   Activities of Daily Living    06/15/2022    1:47 PM  In your present state of health, do you have any difficulty performing the following activities:  Hearing? 0  Vision? 0  Difficulty concentrating or making decisions? 0  Walking or climbing stairs? 0  Dressing or bathing? 0  Doing  errands, shopping? 0  Preparing Food and eating ? N  Using the Toilet? N  In the past six months, have you accidently leaked urine? N  Do you have problems with loss of bowel control? N  Managing your Medications? N  Managing your Finances? N  Housekeeping or managing your Housekeeping? N    Patient Care Team: Plotnikov, Evie Lacks, MD as PCP - General Ladene Artist, MD (Gastroenterology) Calvert Cantor, MD as Consulting Physician (Ophthalmology) Paulla Dolly Tamala Fothergill, DPM as Consulting Physician (Podiatry) Gaynelle Arabian, MD as Consulting Physician (Orthopedic Surgery) Lonia Skinner, MD as Consulting Physician (Ophthalmology)  Indicate any recent Medical Services you may have received from other than [facility] providers in the past year (date may be approximate).     Assessment:   This is a routine wellness examination for Valyn.  Hearing/Vision screen Hearing Screening - Comments:: No issues with hearing. Vision Screening - Comments:: Wears rx glasses - up to date with routine eye exams with [facility]   Dietary issues and exercise activities discussed: Current Exercise Habits: Home exercise routine, Type of exercise: walking, Time (Minutes): 50, Frequency (Times/Week): 5, Weekly Exercise (Minutes/Week): 250, Intensity: Moderate, Exercise limited by: orthopedic condition(s)   Goals Addressed             This Visit's Progress    My goal is to travel more and definitly go on a cruise.        Depression Screen    06/15/2022    1:45 PM 05/12/2022    2:47 PM 01/23/2021    2:36 PM 01/13/2021    2:41 PM 11/20/2019    2:07 PM 09/15/2018   11:42 AM 09/09/2017   10:39 AM  PHQ 2/9 Scores  PHQ - 2 Score 0 0 0 0 0 0 0  PHQ- 9 Score 0 0 0 0   0    Fall Risk    06/15/2022    1:47 PM 05/12/2022    2:46 PM 01/23/2021    2:40 PM 01/13/2021    2:41 PM 11/20/2019    2:06 PM  Fall Risk   Falls in the past year? 0 0 0 0 0  Number falls in past yr: 0 0 0 0 0  Injury with Fall?  0 0 0 0 0  Risk for fall due to : No Fall Risks No Fall Risks No Fall Risks No Fall Risks No Fall Risks  Follow up Falls prevention discussed  Falls evaluation completed  Falls evaluation completed;Education provided;Falls prevention discussed    FALL RISK PREVENTION PERTAINING TO THE HOME:  Any stairs in or around the home? No  If so, are there any without handrails? No  Home free of loose throw rugs in walkways, pet beds, electrical cords, etc? Yes  Adequate lighting in your home to reduce risk of falls? Yes   ASSISTIVE DEVICES UTILIZED TO PREVENT FALLS:  Life alert? Yes  Apple Watch Use of a cane, walker or w/c? No  Grab bars in the bathroom? Yes  Shower chair or bench in shower? Yes  Elevated toilet seat or a handicapped toilet? Yes   TIMED UP AND GO:  Was the test performed? Yes .  Length of time to ambulate 10 feet: 6 sec.   Gait steady and fast without use of assistive device  Cognitive Function:    09/09/2017   10:40 AM  MMSE - Mini Mental State Exam  Orientation to time 5  Orientation to Place 5  Registration 3  Attention/ Calculation 5  Recall 2  Language- name 2 objects 2  Language- repeat 1  Language- follow 3 step command 3  Language- read & follow direction 1  Write a sentence 1  Copy design 1  Total score 29        06/15/2022    1:48 PM 11/20/2019    2:08 PM  6CIT Screen  What Year? 0 points 0 points  What month? 0 points 0 points  What time? 0 points 0 points  Count back from 20 0 points 0 points  Months in reverse 2 points 0 points  Repeat phrase 2 points 0 points  Total Score 4 points 0 points    Immunizations Immunization History  Administered Date(s) Administered   Fluad Quad(high Dose 65+) 04/17/2019, 07/29/2020, 05/05/2021, 05/12/2022   Influenza Split 06/13/2012   Influenza Whole 07/24/2002, 05/22/2008, 06/06/2009, 07/27/2010   Influenza, High Dose Seasonal PF 06/20/2017, 05/16/2018, 07/18/2018   Influenza,inj,Quad PF,6+ Mos  08/03/2013, 08/02/2014, 06/27/2015, 05/04/2016   Influenza,inj,quad, With Preservative 06/16/2017   PFIZER(Purple Top)SARS-COV-2 Vaccination 10/09/2019, 10/30/2019, 06/05/2020, 01/22/2021   Pneumococcal Conjugate-13 10/02/2015   Pneumococcal Polysaccharide-23 06/27/2015   Td 01/31/2009   Td (Adult), 2 Lf Tetanus Toxid, Preservative Free 01/31/2009    TDAP status: Due, Education has been provided regarding the importance of this vaccine. Advised may receive this vaccine at local pharmacy or Health Dept. Aware to provide a copy of the vaccination record if obtained from local pharmacy or Health Dept. Verbalized acceptance and understanding.  Flu Vaccine status: Up to date  Pneumococcal vaccine status: Up to date  Covid-19 vaccine status: Completed vaccines  Qualifies for Shingles Vaccine? Yes   Zostavax completed No   Shingrix Completed?: No.    Education has been provided regarding the importance of this vaccine. Patient has  been advised to call insurance company to determine out of pocket expense if they have not yet received this vaccine. Advised may also receive vaccine at local pharmacy or Health Dept. Verbalized acceptance and understanding.  Screening Tests Health Maintenance  Topic Date Due   Zoster Vaccines- Shingrix (1 of 2) Never done   Diabetic kidney evaluation - Urine ACR  02/05/2017   TETANUS/TDAP  02/01/2019   FOOT EXAM  09/16/2019   COVID-19 Vaccine (5 - Pfizer series) 03/19/2021   OPHTHALMOLOGY EXAM  01/23/2022   HEMOGLOBIN A1C  11/10/2022   Diabetic kidney evaluation - GFR measurement  05/13/2023   Medicare Annual Wellness (AWV)  06/16/2023   COLONOSCOPY (Pts 45-68yr Insurance coverage will need to be confirmed)  11/05/2023   Pneumonia Vaccine 75 Years old  Completed   INFLUENZA VACCINE  Completed   DEXA SCAN  Completed   Hepatitis C Screening  Completed   HPV VACCINES  Aged Out    Health Maintenance  Health Maintenance Due  Topic Date Due   Zoster  Vaccines- Shingrix (1 of 2) Never done   Diabetic kidney evaluation - Urine ACR  02/05/2017   TETANUS/TDAP  02/01/2019   FOOT EXAM  09/16/2019   COVID-19 Vaccine (5 - Pfizer series) 03/19/2021   OPHTHALMOLOGY EXAM  01/23/2022    Colorectal cancer screening: Type of screening: Colonoscopy. Completed 11/04/2020. Repeat every 3 years  Mammogram status: Completed 08/25/2021. Repeat every year  Bone Density status: Completed 10/07/2017. Results reflect: Bone density results: NORMAL. Repeat every 5 years.  Lung Cancer Screening: (Low Dose CT Chest recommended if Age 887-80years, 30 pack-year currently smoking OR have quit w/in 15years.) does not qualify.   Lung Cancer Screening Referral: no  Additional Screening:  Hepatitis C Screening: does qualify; Completed 04/16/2016  Vision Screening: Recommended annual ophthalmology exams for early detection of glaucoma and other disorders of the eye. Is the patient up to date with their annual eye exam?  No  Who is the provider or what is the name of the office in which the patient attends annual eye exams? SGala Romney MD. If pt is not established with a provider, would they like to be referred to a provider to establish care? No .   Dental Screening: Recommended annual dental exams for proper oral hygiene  Community Resource Referral / Chronic Care Management: CRR required this visit?  No   CCM required this visit?  No      Plan:     I have personally reviewed and noted the following in the patient's chart:   Medical and social history Use of alcohol, tobacco or illicit drugs  Current medications and supplements including opioid prescriptions. Patient is not currently taking opioid prescriptions. Functional ability and status Nutritional status Physical activity Advanced directives List of other physicians Hospitalizations, surgeries, and ER visits in previous 12 months Vitals Screenings to include cognitive, depression, and  falls Referrals and appointments  In addition, I have reviewed and discussed with patient certain preventive protocols, quality metrics, and best practice recommendations. A written personalized care plan for preventive services as well as general preventive health recommendations were provided to patient.     SSheral Flow LPN   [id number]  Nurse Notes: N/A  Medical screening examination/treatment/procedure(s) were performed by non-physician practitioner and as supervising physician I was immediately available for consultation/collaboration.  I agree with above. ALew Dawes MD

## 2022-06-24 ENCOUNTER — Other Ambulatory Visit: Payer: Self-pay | Admitting: Internal Medicine

## 2022-06-24 DIAGNOSIS — M25472 Effusion, left ankle: Secondary | ICD-10-CM

## 2022-06-24 DIAGNOSIS — M159 Polyosteoarthritis, unspecified: Secondary | ICD-10-CM

## 2022-06-30 DIAGNOSIS — H40013 Open angle with borderline findings, low risk, bilateral: Secondary | ICD-10-CM | POA: Diagnosis not present

## 2022-06-30 DIAGNOSIS — H04123 Dry eye syndrome of bilateral lacrimal glands: Secondary | ICD-10-CM | POA: Diagnosis not present

## 2022-06-30 DIAGNOSIS — H35033 Hypertensive retinopathy, bilateral: Secondary | ICD-10-CM | POA: Diagnosis not present

## 2022-06-30 DIAGNOSIS — H25813 Combined forms of age-related cataract, bilateral: Secondary | ICD-10-CM | POA: Diagnosis not present

## 2022-07-12 ENCOUNTER — Encounter: Payer: Self-pay | Admitting: Physician Assistant

## 2022-07-22 ENCOUNTER — Encounter: Payer: Self-pay | Admitting: Physician Assistant

## 2022-07-22 ENCOUNTER — Ambulatory Visit: Payer: Medicare HMO | Admitting: Physician Assistant

## 2022-07-22 ENCOUNTER — Other Ambulatory Visit (INDEPENDENT_AMBULATORY_CARE_PROVIDER_SITE_OTHER): Payer: Medicare HMO

## 2022-07-22 VITALS — Ht 61.0 in | Wt 169.0 lb

## 2022-07-22 DIAGNOSIS — D649 Anemia, unspecified: Secondary | ICD-10-CM

## 2022-07-22 DIAGNOSIS — R413 Other amnesia: Secondary | ICD-10-CM

## 2022-07-22 MED ORDER — MEMANTINE HCL 5 MG PO TABS: 5.0000 mg | ORAL_TABLET | Freq: Every evening | ORAL | 11 refills | Status: DC

## 2022-07-22 NOTE — Patient Instructions (Addendum)
It was a pleasure to see you today at our office.   Recommendations:  Neurocognitive evaluation at our office MRI of the brain, the radiology office will call you to arrange you appointment Check labs today Start Memantine 5 mg tablet at night   Follow up in 1 month    Whom to call:  Memory  decline, memory medications: Call our office [phone]   For psychiatric meds, mood meds: Please have your primary care physician manage these medications.       For assessment of decision of mental capacity and competency:  Call Dr. Anthoney Harada, geriatric psychiatrist at [phone]  For guidance in geriatric dementia issues please call [facility] [phone]  For guidance regarding [facility] and if placement were needed at the facility, contact Arnell Asal, Social Worker tel: [phone]  If you have any severe symptoms of a stroke, or other severe issues such as confusion,severe chills or fever, etc call 911 or go to the ER as you may need to be evaluated further   Feel free to visit Facebook page " [email or web address]" for tips of how to care for people with memory problems.      RECOMMENDATIONS FOR ALL PATIENTS WITH MEMORY PROBLEMS: 1. Continue to exercise (Recommend 30 minutes of walking everyday, or 3 hours every week) 2. Increase social interactions - continue going to [place] and enjoy social gatherings with friends and family 3. Eat healthy, avoid fried foods and eat more fruits and vegetables 4. Maintain adequate blood pressure, blood sugar, and blood cholesterol level. Reducing the risk of stroke and cardiovascular disease also helps promoting better memory. 5. Avoid stressful situations. Live a simple life and avoid aggravations. Organize your time and prepare for the next day in anticipation. 6. Sleep well, avoid any interruptions of sleep and avoid any distractions in the bedroom that may interfere with adequate sleep quality 7. Avoid sugar,  avoid sweets as there is a strong link between excessive sugar intake, diabetes, and cognitive impairment We discussed the Mediterranean diet, which has been shown to help patients reduce the risk of progressive memory disorders and reduces cardiovascular risk. This includes eating fish, eat fruits and green leafy vegetables, nuts like almonds and hazelnuts, walnuts, and also use olive oil. Avoid fast foods and fried foods as much as possible. Avoid sweets and sugar as sugar use has been linked to worsening of memory function.  There is always a concern of gradual progression of memory problems. If this is the case, then we may need to adjust level of care according to patient needs. Support, both to the patient and caregiver, should then be put into place.      You have been referred for a neuropsychological evaluation (i.e., evaluation of memory and thinking abilities). Please bring someone with you to this appointment if possible, as it is helpful for the doctor to hear from both you and another adult who knows you well. Please bring eyeglasses and hearing aids if you wear them.    The evaluation will take approximately 3 hours and has two parts:   The first part is a clinical interview with the neuropsychologist (Dr. Melvyn Novas or Dr. Nicole Kindred). During the interview, the neuropsychologist will speak with you and the individual you brought to the appointment.    The second part of the evaluation is testing with the doctor's technician Hinton Dyer or Maudie Mercury). During the testing, the technician will ask you to remember different types of material, solve problems, and answer  some questionnaires. Your family member will not be present for this portion of the evaluation.   Please note: We must reserve several hours of the neuropsychologist's time and the psychometrician's time for your evaluation appointment. As such, there is a No-Show fee of $100. If you are unable to attend any of your appointments, please contact  our office as soon as possible to reschedule.    FALL PRECAUTIONS: Be cautious when walking. Scan the area for obstacles that may increase the risk of trips and falls. When getting up in the mornings, sit up at the edge of the bed for a few minutes before getting out of bed. Consider elevating the bed at the head end to avoid drop of blood pressure when getting up. Walk always in a well-lit room (use night lights in the walls). Avoid area rugs or power cords from appliances in the middle of the walkways. Use a walker or a cane if necessary and consider physical therapy for balance exercise. Get your eyesight checked regularly.  FINANCIAL OVERSIGHT: Supervision, especially oversight when making financial decisions or transactions is also recommended.  HOME SAFETY: Consider the safety of the kitchen when operating appliances like stoves, microwave oven, and blender. Consider having supervision and share cooking responsibilities until no longer able to participate in those. Accidents with firearms and other hazards in the house should be identified and addressed as well.   ABILITY TO BE LEFT ALONE: If patient is unable to contact 911 operator, consider using LifeLine, or when the need is there, arrange for someone to stay with patients. Smoking is a fire hazard, consider supervision or cessation. Risk of wandering should be assessed by caregiver and if detected at any point, supervision and safe proof recommendations should be instituted.  MEDICATION SUPERVISION: Inability to self-administer medication needs to be constantly addressed. Implement a mechanism to ensure safe administration of the medications.   DRIVING: Regarding driving, in patients with progressive memory problems, driving will be impaired. We advise to have someone else do the driving if trouble finding directions or if minor accidents are reported. Independent driving assessment is available to determine safety of driving.   If you  are interested in the driving assessment, you can contact the following:  [employer] in [place]  [place] [phone] or [phone]    [place] refers to food and lifestyle choices that are based on the traditions of countries located on the [employer]. This way of eating has been shown to help prevent certain conditions and improve outcomes for people who have chronic diseases, like kidney disease and heart disease. What are tips for following this plan? Lifestyle  Cook and eat meals together with your family, when possible. Drink enough fluid to keep your urine clear or pale yellow. Be physically active every day. This includes: Aerobic exercise like running or swimming. Leisure activities like gardening, walking, or housework. Get 7-8 hours of sleep each night. If recommended by your health care provider, drink red wine in moderation. This means 1 glass a day for nonpregnant women and 2 glasses a day for men. A glass of wine equals 5 oz (150 mL). Reading food labels  Check the serving size of packaged foods. For foods such as rice and pasta, the serving size refers to the amount of cooked product, not dry. Check the total fat in packaged foods. Avoid foods that have saturated fat or trans  fats. Check the ingredients list for added sugars, such as corn syrup. Shopping  At the grocery store, buy most of your food from the areas near the walls of the store. This includes: Fresh fruits and vegetables (produce). Grains, beans, nuts, and seeds. Some of these may be available in unpackaged forms or large amounts (in bulk). Fresh seafood. Poultry and eggs. Low-fat dairy products. Buy whole ingredients instead of prepackaged foods. Buy fresh fruits and vegetables in-season from local farmers markets. Buy frozen fruits and  vegetables in resealable bags. If you do not have access to quality fresh seafood, buy precooked frozen shrimp or canned fish, such as tuna, salmon, or sardines. Buy small amounts of raw or cooked vegetables, salads, or olives from the deli or salad bar at your store. Stock your pantry so you always have certain foods on hand, such as olive oil, canned tuna, canned tomatoes, rice, pasta, and beans. Cooking  Cook foods with extra-virgin olive oil instead of using butter or other vegetable oils. Have meat as a side dish, and have vegetables or grains as your main dish. This means having meat in small portions or adding small amounts of meat to foods like pasta or stew. Use beans or vegetables instead of meat in common dishes like chili or lasagna. Experiment with different cooking methods. Try roasting or broiling vegetables instead of steaming or sauteing them. Add frozen vegetables to soups, stews, pasta, or rice. Add nuts or seeds for added healthy fat at each meal. You can add these to yogurt, salads, or vegetable dishes. Marinate fish or vegetables using olive oil, lemon juice, garlic, and fresh herbs. Meal planning  Plan to eat 1 vegetarian meal one day each week. Try to work up to 2 vegetarian meals, if possible. Eat seafood 2 or more times a week. Have healthy snacks readily available, such as: Vegetable sticks with hummus. Greek yogurt. Fruit and nut trail mix. Eat balanced meals throughout the week. This includes: Fruit: 2-3 servings a day Vegetables: 4-5 servings a day Low-fat dairy: 2 servings a day Fish, poultry, or lean meat: 1 serving a day Beans and legumes: 2 or more servings a week Nuts and seeds: 1-2 servings a day Whole grains: 6-8 servings a day Extra-virgin olive oil: 3-4 servings a day Limit red meat and sweets to only a few servings a month What are my food choices? Mediterranean diet Recommended Grains: Whole-grain pasta. Brown rice. Bulgar wheat. Polenta.  Couscous. Whole-wheat bread. [place]. Vegetables: Artichokes. Beets. Broccoli. Cabbage. Carrots. Eggplant. Green beans. Chard. Kale. Spinach. Onions. Leeks. Peas. Squash. Tomatoes. Peppers. Radishes. Fruits: Apples. Apricots. Avocado. Berries. Bananas. Cherries. Dates. Figs. Grapes. Lemons. Melon. Oranges. Peaches. Plums. Pomegranate. Meats and other protein foods: Beans. Almonds. Sunflower seeds. Pine nuts. Peanuts. [place]. Salmon. Scallops. Shrimp. [place]. Tilapia. Clams. Oysters. Eggs. Dairy: Low-fat milk. Cheese. Greek yogurt. Beverages: Water. Red wine. Herbal tea. Fats and oils: Extra virgin olive oil. Avocado oil. Grape seed oil. Sweets and desserts: [place] yogurt with honey. Baked apples. Poached pears. Trail mix. Seasoning and other foods: Basil. Cilantro. Coriander. Cumin. Mint. Parsley. Sage. Rosemary. Tarragon. Garlic. Oregano. Thyme. Pepper. Balsalmic vinegar. Tahini. Hummus. Tomato sauce. Olives. Mushrooms. Limit these Grains: Prepackaged pasta or rice dishes. Prepackaged cereal with added sugar. Vegetables: Deep fried potatoes (french fries). Fruits: Fruit canned in syrup. Meats and other protein foods: Beef. Pork. Lamb. Poultry with skin. Hot dogs. [place] Salines. Dairy: Ice cream. Sour cream. Whole milk. Beverages: Juice. Sugar-sweetened soft drinks. Beer. Liquor and spirits. Fats and  oils: Butter. Canola oil. Vegetable oil. Beef fat (tallow). Lard. Sweets and desserts: Cookies. Cakes. Pies. Candy. Seasoning and other foods: Mayonnaise. Premade sauces and marinades. The items listed may not be a complete list. Talk with your dietitian about what dietary choices are right for you. Summary The Mediterranean diet includes both food and lifestyle choices. Eat a variety of fresh fruits and vegetables, beans, nuts, seeds, and whole grains. Limit the amount of red meat and sweets that you eat. Talk with your health care provider about whether it is safe for you to drink red wine in  moderation. This means 1 glass a day for nonpregnant women and 2 glasses a day for men. A glass of wine equals 5 oz (150 mL). This information is not intended to replace advice given to you by your health care provider. Make sure you discuss any questions you have with your health care provider. Document Released: 03/25/2016 Document Revised: 04/27/2016 Document Reviewed: 03/25/2016 Elsevier Interactive Patient Education  2017 [employer].   We have sent a referral to [facility] for your MRI and they will call you directly to schedule your appointment. They are located at [place]. If you need to contact them directly please call [phone].  Please go to [address] for labs

## 2022-07-22 NOTE — Progress Notes (Signed)
Assessment/Plan:   Memory impairment  Monica Horn is a very pleasant 76 y.o. year old RH female with  a history of hypertension, hyperlipidemia, microcytic anemia,  DM2, anxiety, seen today for evaluation of memory loss. MoCA today is 19 /30. Workup up in progress    MRI brain without contrast to assess for underlying structural abnormality and assess vascular load  Neurocognitive testing to further evaluate cognitive concerns and determine other underlying cause of memory changes, including potential contribution from sleep, anxiety, or depression  Check B12, TSH, iron studies Follow-up microcytic anemia as per PCP Continue to control mood as per PCP Recommend good control of cardiovascular risk factors [place] up in 1 month   Subjective:   The patient is accompanied by her husband who supplements the history.   How long did patient have memory difficulties? 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 " I know that age has to do something to do with it".  Denies forgetting recent conversations, but occasionally she forgets names of people that she has not seen in a long time. Des not do crossword puzzles "but when I have time I like word finding" repeats oneself? Denies and husband agrees Disoriented when walking into a room?  Patient denies   Leaving objects in unusual places?  Patient denies   Ambulates  with difficulty?  She exercises 4 times a week, and has R knee arthritis and L foot arthritis which limits her mobility  Recent falls or head injuries?  Patient denies   History of seizures?   Patient denies   Wandering behavior?  Patient denies   Patient drives?   Continues to drive, occasionally may wonder ? Where I am going, oh yeah, I remember!" Any history of depression?:  Patient denies   Hallucinations?  Patient denies   Paranoia?   Patient denies   Patient reports that sleeps well without vivid dreams, REM behavior or sleepwalking . Sometimes in the middle of the night she thinks  to make plans for [facility] which may keep her awake History of sleep apnea?  Patient denies   Any hygiene concerns?  Patient denies   Independent of bathing and dressing?  Endorsed  Does the patient needs help with medications? Patient in charge   Who is in charge of the finances?  Patient is in charge and  thinking that her daughter should take over the bills. Any changes in appetite?  Patient denies "We eat everything is on the table"   Patient have trouble swallowing? Patient denies   Does the patient cook?  Patient cooks together with her husband, he is a Biomedical scientist Any kitchen accidents such as leaving the stove on? Patient denies   Any headaches?  Very seldom she has a headache controlled with Tylenol   Vision changes? Patient denies   Any focal numbness or tingling?  Patient denies   Chronic pain takes a shot in the knee on a regular basis due to arthritis   Unilateral weakness?  Patient denies   Any tremors?  Patient denies   Any history of anosmia?  Patient denies   Any incontinence of urine?  Patient denies   Any bowel dysfunction?   Patient denies   History of heavy alcohol intake?  Patient denies   History of heavy tobacco use?  Patient denies   Family history of dementia?  Patient  denies  Patient lives with her husband   Recent labs September 2023 A1c 8.1, MCV 75.9 (chronic low MCV), TSH 1.61  Past Medical History:  Diagnosis Date   Abnormal EKG 2014   NONSPECIFIC ST DEPRESSION + T ANORMALITY, WORKED UP 2014 DR Crissie Sickles AND RELEASED BY DR Lovena Le   Allergy    Arthritis    Blood transfusion without reported diagnosis    Colon polyps    Diabetes mellitus    PT. 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   w/ focal high grade dysplasia, no carcinoma     Past Surgical History:  Procedure Laterality Date   CARPAL TUNNEL RELEASE     Lt  wrist, rt wrist 2014   COLONOSCOPY     POLYPECTOMY     TOTAL HIP ARTHROPLASTY Left 10/18/2018   Procedure: TOTAL HIP ARTHROPLASTY ANTERIOR APPROACH;  Surgeon: Gaynelle Arabian, MD;  Location: [facility];  Service: Orthopedics;  Laterality: Left;  129mn   TUBAL LIGATION       Allergies  Allergen Reactions   Amlodipine     Leg swelling   Ibuprofen     gastritis   Benazepril Cough   Losartan Other (See Comments)    Headache   Lovastatin     myalgia    Current Outpatient Medications  Medication Instructions   celecoxib (CELEBREX) 200 MG capsule TAKE 1 CAPSULE EVERY DAY AS NEEDED   cetirizine (ZYRTEC) 10 mg, Oral, Daily PRN   cholecalciferol (VITAMIN D3) 1,000 Units, Oral, Daily   fluticasone (FLONASE) 50 MCG/ACT nasal spray 2 sprays, Each Nare, Daily   furosemide (LASIX) 20 MG tablet TAKE 1 TO 2 TABLETS EVERY DAY   memantine (NAMENDA) 5 mg, Oral, Nightly   Multiple Vitamin (MULTIVITAMIN) tablet 1 tablet, Oral, Daily   pantoprazole (PROTONIX) 40 MG tablet TAKE 1 TABLET EVERY DAY   potassium chloride SA (KLOR-CON M) 20 MEQ tablet 20 mEq, Oral, Daily   repaglinide (PRANDIN) 1 mg, Oral, 3 times daily before meals   Semaglutide (RYBELSUS) 3 MG TABS Rybelsus 3 mg tablet  TAKE 1 TABLET BY MOUTH ONCE DAILY   triamterene-hydrochlorothiazide (MAXZIDE-25) 37.5-25 MG tablet TAKE 1 TABLET EVERY DAY   trolamine salicylate (ASPERCREME) 10 % cream 1 application , Topical, 2 times daily PRN     VITALS:   Vitals:   07/22/22 1343  Weight: 169 lb (76.7 kg)  Height: '5\' 1"'$  (1.549 m)      PHYSICAL EXAM   HEENT:  Normocephalic, atraumatic. The mucous membranes are moist. The superficial temporal arteries are without  ropiness or tenderness. Cardiovascular: Regular rate and rhythm. Lungs: Clear to auscultation bilaterally. Neck: There are no carotid bruits noted bilaterally.  NEUROLOGICAL:    07/22/2022    1:00 PM  Montreal Cognitive Assessment   Visuospatial/ Executive (0/5) 1  Naming (0/3) 2  Attention: Read list of digits (0/2) 2  Attention: Read list of letters (0/1) 1  Attention: Serial 7 subtraction starting at 100 (0/3) 0  Language: Repeat phrase (0/2) 1  Language : Fluency (0/1) 1  Abstraction (0/2) 2  Delayed Recall (0/5) 3  Orientation (0/6) 6  Total 19  Adjusted Score (based on education)  19       09/09/2017   10:40 AM  MMSE - Mini Mental State Exam  Orientation to time 5  Orientation to Place 5  Registration 3  Attention/ Calculation 5  Recall 2  Language- name 2 objects 2  Language- repeat 1  Language- follow 3 step command 3  Language- read & follow direction 1  Write a sentence 1  Copy design 1  Total score 29     Orientation:  Alert and oriented to person, place and time. No aphasia or dysarthria. Fund of knowledge is appropriate. Recent memory impaired and remote memory intact.  Attention and concentration are normal.  Able to name objects and repeat phrases. Delayed recall  3 /5 Cranial nerves: There is good facial symmetry. Extraocular muscles are intact and visual fields are full to confrontational testing. Speech is fluent and clear. Soft palate rises symmetrically and there is no tongue deviation. Hearing is intact to conversational tone. Tone: Tone is good throughout. Sensation: Sensation is intact to light touch and pinprick throughout. Vibration is intact at the bilateral big toe.There is no extinction with double simultaneous stimulation. There is no sensory dermatomal level identified. Coordination: The patient has no difficulty with RAM's or FNF bilaterally. Normal finger to nose  Motor: Strength is 5/5 in the bilateral upper and lower extremities. There is  no pronator drift. There are no fasciculations noted. DTR's: Deep tendon reflexes are 2/4 at the bilateral biceps, triceps, brachioradialis, patella and achilles.  Plantar responses are downgoing bilaterally. Gait and Station: The patient is able to ambulate without difficulty.  The patient is able to ambulate in a tandem fashion. The patient is able to stand in the Romberg position.     Thank you for allowing [place] the opportunity to participate in the care of this nice patient. Please do not hesitate to contact us for any questions or concerns.   Total time spent on today's visit was 61 minutes dedicated to this patient today, preparing to see patient, examining the patient, ordering tests and/or medications and counseling the patient, documenting clinical information in the EHR or other health record, independently interpreting results and communicating results to the patient/family, discussing treatment and goals, answering patient's questions and coordinating care.  Cc:  Plotnikov, Evie Lacks, MD  Sharene Butters 07/22/2022 2:31 PM

## 2022-07-23 LAB — IRON AND TIBC
Iron Saturation: 25 % (ref 15–55)
Iron: 80 ug/dL (ref 27–139)
Total Iron Binding Capacity: 315 ug/dL (ref 250–450)
UIBC: 235 ug/dL (ref 118–369)

## 2022-07-23 LAB — VITAMIN B12: Vitamin B-12: 320 pg/mL (ref 211–911)

## 2022-07-23 LAB — FOLATE: Folate: 10.6 ng/mL (ref >5.9)

## 2022-07-23 LAB — FERRITIN: Ferritin: 103 ng/mL (ref 10.0–291.0)

## 2022-07-23 NOTE — Progress Notes (Signed)
Labs are essentially unremarkable although the B12 is on the lower side, recommend 1000 mcg a day of B12 to maintain a good level, thank you

## 2022-07-26 NOTE — Progress Notes (Signed)
Mailbox is full.

## 2022-07-30 ENCOUNTER — Other Ambulatory Visit: Payer: Self-pay | Admitting: Internal Medicine

## 2022-07-30 DIAGNOSIS — Z1231 Encounter for screening mammogram for malignant neoplasm of breast: Secondary | ICD-10-CM

## 2022-08-11 ENCOUNTER — Ambulatory Visit: Payer: Medicare HMO | Admitting: Internal Medicine

## 2022-08-17 ENCOUNTER — Ambulatory Visit (INDEPENDENT_AMBULATORY_CARE_PROVIDER_SITE_OTHER): Payer: Medicare HMO | Admitting: Internal Medicine

## 2022-08-17 ENCOUNTER — Encounter: Payer: Self-pay | Admitting: Internal Medicine

## 2022-08-17 VITALS — BP 124/80 | HR 103 | Temp 98.1°F | Ht 61.0 in | Wt 178.0 lb

## 2022-08-17 DIAGNOSIS — R413 Other amnesia: Secondary | ICD-10-CM | POA: Diagnosis not present

## 2022-08-17 DIAGNOSIS — E119 Type 2 diabetes mellitus without complications: Secondary | ICD-10-CM | POA: Diagnosis not present

## 2022-08-17 DIAGNOSIS — E785 Hyperlipidemia, unspecified: Secondary | ICD-10-CM | POA: Diagnosis not present

## 2022-08-17 DIAGNOSIS — I10 Essential (primary) hypertension: Secondary | ICD-10-CM | POA: Diagnosis not present

## 2022-08-17 MED ORDER — REPAGLINIDE 1 MG PO TABS: 1.0000 mg | ORAL_TABLET | Freq: Three times a day (TID) | ORAL | 3 refills | Status: AC

## 2022-08-17 MED ORDER — RYBELSUS 3 MG PO TABS: 3.0000 mg | ORAL_TABLET | Freq: Every day | ORAL | 1 refills | Status: DC

## 2022-08-17 NOTE — Assessment & Plan Note (Addendum)
On Namenda F/u w/Sara [place], [place]

## 2022-08-17 NOTE — Assessment & Plan Note (Signed)
Cont on Maxzide

## 2022-08-17 NOTE — Progress Notes (Signed)
Subjective:  Patient ID: Monica Horn, female    DOB: May 16, 1946  Age: 77 y.o. MRN: [id number]  CC: Follow-up   HPI Monica Horn presents for memory issues, DM, dyslipidemia  Outpatient Medications Prior to Visit  Medication Sig Dispense Refill   celecoxib (CELEBREX) 200 MG capsule TAKE 1 CAPSULE EVERY DAY AS NEEDED 90 capsule 1   cetirizine (ZYRTEC) 10 MG tablet Take 1 tablet (10 mg total) by mouth daily as needed. 90 tablet 3   cholecalciferol (VITAMIN D3) 10 MCG (400 UNIT) TABS tablet Take 1,000 Units by mouth daily.     fluticasone (FLONASE) 50 MCG/ACT nasal spray Place 2 sprays into both nostrils daily. 16 g 6   furosemide (LASIX) 20 MG tablet TAKE 1 TO 2 TABLETS EVERY DAY 180 tablet 3   memantine (NAMENDA) 5 MG tablet Take 1 tablet (5 mg total) by mouth at bedtime. 30 tablet 11   Multiple Vitamin (MULTIVITAMIN) tablet Take 1 tablet by mouth daily.     pantoprazole (PROTONIX) 40 MG tablet TAKE 1 TABLET EVERY DAY 90 tablet 3   potassium chloride SA (KLOR-CON M) 20 MEQ tablet Take 1 tablet (20 mEq total) by mouth daily. 90 tablet 3   triamterene-hydrochlorothiazide (MAXZIDE-25) 37.5-25 MG tablet TAKE 1 TABLET EVERY DAY 90 tablet 3   trolamine salicylate (ASPERCREME) 10 % cream Apply 1 application topically 2 (two) times daily as needed for muscle pain.     repaglinide (PRANDIN) 1 MG tablet Take 1 tablet (1 mg total) by mouth 3 (three) times daily before meals. (Patient not taking: Reported on 08/17/2022) 90 tablet 5   Semaglutide (RYBELSUS) 3 MG TABS Rybelsus 3 mg tablet  TAKE 1 TABLET BY MOUTH ONCE DAILY (Patient not taking: Reported on 08/17/2022)     No facility-administered medications prior to visit.    ROS: Review of Systems  Constitutional:  Negative for activity change, appetite change, chills, fatigue and unexpected weight change.  HENT:  Negative for congestion, mouth sores and sinus pressure.   Eyes:  Negative for visual disturbance.  Respiratory:  Negative for  cough and chest tightness.   Gastrointestinal:  Negative for abdominal pain and nausea.  Genitourinary:  Negative for difficulty urinating, frequency and vaginal pain.  Musculoskeletal:  Negative for back pain and gait problem.  Skin:  Negative for pallor and rash.  Neurological:  Negative for dizziness, tremors, weakness, numbness and headaches.  Psychiatric/Behavioral:  Positive for decreased concentration. Negative for confusion, sleep disturbance and suicidal ideas. The patient is not nervous/anxious.     Objective:  BP 124/80 (BP Location: Right Arm, Patient Position: Sitting, Cuff Size: Normal)   Pulse (!) 103   Temp 98.1 F (36.7 C) (Oral)   Ht '5\' 1"'$  (1.549 m)   Wt 178 lb (80.7 kg)   SpO2 100%   BMI 33.63 kg/m   BP Readings from Last 3 Encounters:  08/17/22 124/80  06/15/22 120/70  05/12/22 (!) 146/78    Wt Readings from Last 3 Encounters:  08/17/22 178 lb (80.7 kg)  07/22/22 169 lb (76.7 kg)  06/15/22 174 lb 9.6 oz (79.2 kg)    Physical Exam Constitutional:      General: She is not in acute distress.    Appearance: She is well-developed. She is obese.  HENT:     Head: Normocephalic.     Right Ear: External ear normal.     Left Ear: External ear normal.     Nose: Nose normal.  Eyes:  General:        Right eye: No discharge.        Left eye: No discharge.     Conjunctiva/sclera: Conjunctivae normal.     Pupils: Pupils are equal, round, and reactive to light.  Neck:     Thyroid: No thyromegaly.     Vascular: No JVD.     Trachea: No tracheal deviation.  Cardiovascular:     Rate and Rhythm: Normal rate and regular rhythm.     Heart sounds: Normal heart sounds.  Pulmonary:     Effort: No respiratory distress.     Breath sounds: No stridor. No wheezing.  Abdominal:     General: Bowel sounds are normal. There is no distension.     Palpations: Abdomen is soft. There is no mass.     Tenderness: There is no abdominal tenderness. There is no guarding or  rebound.  Musculoskeletal:        General: No tenderness.     Cervical back: Normal range of motion and neck supple. No rigidity.  Lymphadenopathy:     Cervical: No cervical adenopathy.  Skin:    Findings: No erythema or rash.  Neurological:     Cranial Nerves: No cranial nerve deficit.     Motor: No abnormal muscle tone.     Coordination: Coordination normal.     Deep Tendon Reflexes: Reflexes normal.  Psychiatric:        Behavior: Behavior normal.        Thought Content: Thought content normal.        Judgment: Judgment normal.     Lab Results  Component Value Date   WBC 7.4 01/06/2022   HGB 13.5 01/06/2022   HCT 43.8 01/06/2022   PLT 315.0 01/06/2022   GLUCOSE 232 (H) 05/12/2022   CHOL 192 06/24/2017   TRIG 126.0 06/24/2017   HDL 37.00 (L) 06/24/2017   LDLDIRECT 120.0 05/13/2015   LDLCALC 130 (H) 06/24/2017   ALT 29 05/12/2022   AST 22 05/12/2022   NA 136 05/12/2022   K 3.4 (L) 05/12/2022   CL 100 05/12/2022   CREATININE 1.25 (H) 05/12/2022   BUN 18 05/12/2022   CO2 31 05/12/2022   TSH 1.61 01/06/2022   INR 0.9 10/13/2018   HGBA1C 8.1 (H) 05/12/2022   MICROALBUR 1.5 02/06/2016    MM 3D SCREEN BREAST BILATERAL  Result Date: 08/25/2021 CLINICAL DATA:  Screening. EXAM: DIGITAL SCREENING BILATERAL MAMMOGRAM WITH TOMOSYNTHESIS AND CAD TECHNIQUE: Bilateral screening digital craniocaudal and mediolateral oblique mammograms were obtained. Bilateral screening digital breast tomosynthesis was performed. The images were evaluated with computer-aided detection. COMPARISON:  Previous exam(s). ACR Breast Density Category b: There are scattered areas of fibroglandular density. FINDINGS: There are no findings suspicious for malignancy. IMPRESSION: No mammographic evidence of malignancy. A result letter of this screening mammogram will be mailed directly to the patient. RECOMMENDATION: Screening mammogram in one year. (Code:SM-B-01Y) BI-RADS CATEGORY  1: Negative. Electronically  Signed   By: Abelardo Diesel M.D.   On: 08/25/2021 16:05    Assessment & Plan:   Problem List Items Addressed This Visit     Memory impairment    On Namenda F/u w/Sara Shawn Route, PA      HTN (hypertension)    Cont on Maxzide      Dyslipidemia    Statin intolerant On diet Use fish oil      Diabetes type 2, controlled ([place]) - Primary    Not taking Repaginate - re-start Risks associated with  treatment noncompliance were discussed. Compliance was encouraged. Re-start Rybelsus - use Assistance program, if covered      Relevant Medications   repaglinide (PRANDIN) 1 MG tablet   Semaglutide (RYBELSUS) 3 MG TABS      Meds ordered this encounter  Medications   repaglinide (PRANDIN) 1 MG tablet    Sig: Take 1 tablet (1 mg total) by mouth 3 (three) times daily before meals.    Dispense:  270 tablet    Refill:  3   Semaglutide (RYBELSUS) 3 MG TABS    Sig: Take 3 mg by mouth daily.    Dispense:  30 tablet    Refill:  1      Follow-up: Return in about 3 months (around 11/16/2022) for a follow-up visit.  Walker Kehr, MD

## 2022-08-17 NOTE — Assessment & Plan Note (Signed)
Statin intolerant On diet Use fish oil

## 2022-08-17 NOTE — Assessment & Plan Note (Signed)
Not taking Repaginate - re-start Risks associated with treatment noncompliance were discussed. Compliance was encouraged. Re-start Rybelsus - use Assistance program, if covered

## 2022-08-18 ENCOUNTER — Other Ambulatory Visit: Payer: Medicare HMO

## 2022-08-18 DIAGNOSIS — E119 Type 2 diabetes mellitus without complications: Secondary | ICD-10-CM

## 2022-08-18 DIAGNOSIS — E785 Hyperlipidemia, unspecified: Secondary | ICD-10-CM

## 2022-08-20 ENCOUNTER — Ambulatory Visit
Admission: RE | Admit: 2022-08-20 | Discharge: 2022-08-20 | Disposition: A | Discharge status: Home / Self Care | Payer: Medicare HMO | Source: Ambulatory Visit | Attending: Physician Assistant | Admitting: Physician Assistant

## 2022-08-20 DIAGNOSIS — I6782 Cerebral ischemia: Secondary | ICD-10-CM | POA: Diagnosis not present

## 2022-08-20 DIAGNOSIS — G319 Degenerative disease of nervous system, unspecified: Secondary | ICD-10-CM | POA: Diagnosis not present

## 2022-08-20 DIAGNOSIS — R413 Other amnesia: Secondary | ICD-10-CM | POA: Diagnosis not present

## 2022-08-23 ENCOUNTER — Ambulatory Visit: Payer: Medicare HMO | Admitting: Physician Assistant

## 2022-08-23 ENCOUNTER — Encounter: Payer: Self-pay | Admitting: Physician Assistant

## 2022-08-23 VITALS — BP 113/86 | HR 102 | Ht 61.0 in | Wt 172.6 lb

## 2022-08-23 DIAGNOSIS — R413 Other amnesia: Secondary | ICD-10-CM

## 2022-08-23 MED ORDER — MEMANTINE HCL 5 MG PO TABS: 5.0000 mg | ORAL_TABLET | Freq: Two times a day (BID) | ORAL | 11 refills | Status: DC

## 2022-08-23 NOTE — Patient Instructions (Addendum)
It was a pleasure to see you today at our office.   Recommendations:  Neurocognitive evaluation at our office scheduled for feb 2024  Start Memantine 5 mg tablet, increase to 5 mg twice a day  Follow up in 6 months  Continue B12 suplements  Whom to call:  Memory  decline, memory medications: Call our office [phone]   For psychiatric meds, mood meds: Please have your primary care physician manage these medications.       For assessment of decision of mental capacity and competency:  Call Dr. Anthoney Harada, geriatric psychiatrist at [phone]  For guidance in geriatric dementia issues please call [facility] [phone]  For guidance regarding [facility] and if placement were needed at the facility, contact Arnell Asal, Social Worker tel: [phone]  If you have any severe symptoms of a stroke, or other severe issues such as confusion,severe chills or fever, etc call 911 or go to the ER as you may need to be evaluated further   Feel free to visit Facebook page " [email or web address]" for tips of how to care for people with memory problems.      RECOMMENDATIONS FOR ALL PATIENTS WITH MEMORY PROBLEMS: 1. Continue to exercise (Recommend 30 minutes of walking everyday, or 3 hours every week) 2. Increase social interactions - continue going to [place] and enjoy social gatherings with friends and family 3. Eat healthy, avoid fried foods and eat more fruits and vegetables 4. Maintain adequate blood pressure, blood sugar, and blood cholesterol level. Reducing the risk of stroke and cardiovascular disease also helps promoting better memory. 5. Avoid stressful situations. Live a simple life and avoid aggravations. Organize your time and prepare for the next day in anticipation. 6. Sleep well, avoid any interruptions of sleep and avoid any distractions in the bedroom that may interfere with adequate sleep quality 7. Avoid sugar, avoid sweets as there is a strong  link between excessive sugar intake, diabetes, and cognitive impairment We discussed the Mediterranean diet, which has been shown to help patients reduce the risk of progressive memory disorders and reduces cardiovascular risk. This includes eating fish, eat fruits and green leafy vegetables, nuts like almonds and hazelnuts, walnuts, and also use olive oil. Avoid fast foods and fried foods as much as possible. Avoid sweets and sugar as sugar use has been linked to worsening of memory function.  There is always a concern of gradual progression of memory problems. If this is the case, then we may need to adjust level of care according to patient needs. Support, both to the patient and caregiver, should then be put into place.      You have been referred for a neuropsychological evaluation (i.e., evaluation of memory and thinking abilities). Please bring someone with you to this appointment if possible, as it is helpful for the doctor to hear from both you and another adult who knows you well. Please bring eyeglasses and hearing aids if you wear them.    The evaluation will take approximately 3 hours and has two parts:   The first part is a clinical interview with the neuropsychologist (Dr. Melvyn Novas or Dr. Nicole Kindred). During the interview, the neuropsychologist will speak with you and the individual you brought to the appointment.    The second part of the evaluation is testing with the doctor's technician Hinton Dyer or Maudie Mercury). During the testing, the technician will ask you to remember different types of material, solve problems, and answer some questionnaires. Your family member will  not be present for this portion of the evaluation.   Please note: We must reserve several hours of the neuropsychologist's time and the psychometrician's time for your evaluation appointment. As such, there is a No-Show fee of $100. If you are unable to attend any of your appointments, please contact our office as soon as possible to  reschedule.    FALL PRECAUTIONS: Be cautious when walking. Scan the area for obstacles that may increase the risk of trips and falls. When getting up in the mornings, sit up at the edge of the bed for a few minutes before getting out of bed. Consider elevating the bed at the head end to avoid drop of blood pressure when getting up. Walk always in a well-lit room (use night lights in the walls). Avoid area rugs or power cords from appliances in the middle of the walkways. Use a walker or a cane if necessary and consider physical therapy for balance exercise. Get your eyesight checked regularly.  FINANCIAL OVERSIGHT: Supervision, especially oversight when making financial decisions or transactions is also recommended.  HOME SAFETY: Consider the safety of the kitchen when operating appliances like stoves, microwave oven, and blender. Consider having supervision and share cooking responsibilities until no longer able to participate in those. Accidents with firearms and other hazards in the house should be identified and addressed as well.   ABILITY TO BE LEFT ALONE: If patient is unable to contact 911 operator, consider using LifeLine, or when the need is there, arrange for someone to stay with patients. Smoking is a fire hazard, consider supervision or cessation. Risk of wandering should be assessed by caregiver and if detected at any point, supervision and safe proof recommendations should be instituted.  MEDICATION SUPERVISION: Inability to self-administer medication needs to be constantly addressed. Implement a mechanism to ensure safe administration of the medications.   DRIVING: Regarding driving, in patients with progressive memory problems, driving will be impaired. We advise to have someone else do the driving if trouble finding directions or if minor accidents are reported. Independent driving assessment is available to determine safety of driving.   If you are interested in the driving  assessment, you can contact the following:  [employer] in [place]  [place] [phone] or [phone]    [place] refers to food and lifestyle choices that are based on the traditions of countries located on the [employer]. This way of eating has been shown to help prevent certain conditions and improve outcomes for people who have chronic diseases, like kidney disease and heart disease. What are tips for following this plan? Lifestyle  Cook and eat meals together with your family, when possible. Drink enough fluid to keep your urine clear or pale yellow. Be physically active every day. This includes: Aerobic exercise like running or swimming. Leisure activities like gardening, walking, or housework. Get 7-8 hours of sleep each night. If recommended by your health care provider, drink red wine in moderation. This means 1 glass a day for nonpregnant women and 2 glasses a day for men. A glass of wine equals 5 oz (150 mL). Reading food labels  Check the serving size of packaged foods. For foods such as rice and pasta, the serving size refers to the amount of cooked product, not dry. Check the total fat in packaged foods. Avoid foods that have saturated fat or trans fats. Check the ingredients list for  added sugars, such as corn syrup. Shopping  At the grocery store, buy most of your food from the areas near the walls of the store. This includes: Fresh fruits and vegetables (produce). Grains, beans, nuts, and seeds. Some of these may be available in unpackaged forms or large amounts (in bulk). Fresh seafood. Poultry and eggs. Low-fat dairy products. Buy whole ingredients instead of prepackaged foods. Buy fresh fruits and vegetables in-season from local farmers markets. Buy frozen fruits and vegetables in resealable bags. If  you do not have access to quality fresh seafood, buy precooked frozen shrimp or canned fish, such as tuna, salmon, or sardines. Buy small amounts of raw or cooked vegetables, salads, or olives from the deli or salad bar at your store. Stock your pantry so you always have certain foods on hand, such as olive oil, canned tuna, canned tomatoes, rice, pasta, and beans. Cooking  Cook foods with extra-virgin olive oil instead of using butter or other vegetable oils. Have meat as a side dish, and have vegetables or grains as your main dish. This means having meat in small portions or adding small amounts of meat to foods like pasta or stew. Use beans or vegetables instead of meat in common dishes like chili or lasagna. Experiment with different cooking methods. Try roasting or broiling vegetables instead of steaming or sauteing them. Add frozen vegetables to soups, stews, pasta, or rice. Add nuts or seeds for added healthy fat at each meal. You can add these to yogurt, salads, or vegetable dishes. Marinate fish or vegetables using olive oil, lemon juice, garlic, and fresh herbs. Meal planning  Plan to eat 1 vegetarian meal one day each week. Try to work up to 2 vegetarian meals, if possible. Eat seafood 2 or more times a week. Have healthy snacks readily available, such as: Vegetable sticks with hummus. Greek yogurt. Fruit and nut trail mix. Eat balanced meals throughout the week. This includes: Fruit: 2-3 servings a day Vegetables: 4-5 servings a day Low-fat dairy: 2 servings a day Fish, poultry, or lean meat: 1 serving a day Beans and legumes: 2 or more servings a week Nuts and seeds: 1-2 servings a day Whole grains: 6-8 servings a day Extra-virgin olive oil: 3-4 servings a day Limit red meat and sweets to only a few servings a month What are my food choices? Mediterranean diet Recommended Grains: Whole-grain pasta. Brown rice. Bulgar wheat. Polenta. Couscous. Whole-wheat bread. [place]. Vegetables: Artichokes. Beets. Broccoli. Cabbage. Carrots. Eggplant. Green beans. Chard. Kale. Spinach. Onions. Leeks. Peas. Squash. Tomatoes. Peppers. Radishes. Fruits: Apples. Apricots. Avocado. Berries. Bananas. Cherries. Dates. Figs. Grapes. Lemons. Melon. Oranges. Peaches. Plums. Pomegranate. Meats and other protein foods: Beans. Almonds. Sunflower seeds. Pine nuts. Peanuts. Lar[place]. Salmon. Scallops. Shrimp. Chestnut. Tilapia. Clams. Oysters. Eggs. Dairy: Low-fat milk. Cheese. Greek yogurt. Beverages: Water. Red wine. Herbal tea. Fats and oils: Extra virgin olive oil. Avocado oil. Grape seed oil. Sweets and desserts: [place] yogurt with honey. Baked apples. Poached pears. Trail mix. Seasoning and other foods: Basil. Cilantro. Coriander. Cumin. Mint. Parsley. Sage. Rosemary. Tarragon. Garlic. Oregano. Thyme. Pepper. Balsalmic vinegar. Tahini. Hummus. Tomato sauce. Olives. Mushrooms. Limit these Grains: Prepackaged pasta or rice dishes. Prepackaged cereal with added sugar. Vegetables: Deep fried potatoes (french fries). Fruits: Fruit canned in syrup. Meats and other protein foods: Beef. Pork. Lamb. Poultry with skin. Hot dogs. Berniece Salines. Dairy: Ice cream. Sour cream. Whole milk. Beverages: Juice. Sugar-sweetened soft drinks. Beer. Liquor and spirits. Fats and oils: Butter. Canola oil. Vegetable oil.  Beef fat (tallow). Lard. Sweets and desserts: Cookies. Cakes. Pies. Candy. Seasoning and other foods: Mayonnaise. Premade sauces and marinades. The items listed may not be a complete list. Talk with your dietitian about what dietary choices are right for you. Summary The Mediterranean diet includes both food and lifestyle choices. Eat a variety of fresh fruits and vegetables, beans, nuts, seeds, and whole grains. Limit the amount of red meat and sweets that you eat. Talk with your health care provider about whether it is safe for you to drink red wine in moderation. This means 1 glass a day for  nonpregnant women and 2 glasses a day for men. A glass of wine equals 5 oz (150 mL). This information is not intended to replace advice given to you by your health care provider. Make sure you discuss any questions you have with your health care provider. Document Released: 03/25/2016 Document Revised: 04/27/2016 Document Reviewed: 03/25/2016 Elsevier Interactive Patient Education  2017 [employer].   We have sent a referral to [facility] for your MRI and they will call you directly to schedule your appointment. They are located at [address]. If you need to contact them directly please call [phone].  Please go to [address] for labs

## 2022-08-23 NOTE — Progress Notes (Signed)
Assessment/Plan:   Memory Impairment   Monica Horn is a very pleasant 77 y.o. RH female  with  a history of hypertension, hyperlipidemia, microcytic anemia,  DM2, anxiety, seen today in follow up to discuss the 08/20/2022 MRI of the brain results. These were personally reviewed, remarkable for no acute findings, mild generalized age-related volume loss, without lobar predominance, minimal small vessel changes of the pons, right thalamus and hemispheric white matter During her last visit on 07/22/2022, her MoCA was 19/30.  She is on memantine 5 mg nightly .        Follow up in  6 months. Follow-up microcytic anemia as per PCP Continue mood control as per PCP Recommend good control of cardiovascular risk factors Increase memantine to 5 mg bid not that is better tolerated      Subjective:    This patient is accompanied in the office by her husband who supplements the history.  Previous records as well as any outside records available were reviewed prior to todays visit.    Any changes in memory since last visit?  She denies any changes in her memory, she continues to be very active in her [place] activities, as well as socializing and traveling.  She is tolerating memantine 5 mg nightly well.  She also is taking [employer] mane over the counter   Initial evaluation 07/22/2022 How long did patient have memory difficulties? 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 " I know that age has to do something to do with it".  Denies forgetting recent conversations, but occasionally she forgets names of people that she has not seen in a long time. Des not do crossword puzzles "but when I have time I like word finding" repeats oneself? Denies and husband agrees Disoriented when walking into a room?  Patient denies   Leaving objects in unusual places?  Patient denies    Ambulates  with difficulty?  She exercises 4 times a week, and has R knee arthritis and L foot arthritis which limits her mobility  Recent falls or head injuries?  Patient denies   History of seizures?   Patient denies   Wandering behavior?  Patient denies   Patient drives?   Continues to drive, occasionally may wonder ? Where I am going, oh yeah, I remember!" Any history of depression?:  Patient denies   Hallucinations?  Patient denies   Paranoia?  Patient denies   Patient reports that sleeps well without vivid dreams, REM behavior or sleepwalking . Sometimes in the middle of the night she thinks  to make plans for [facility] which may keep her awake History of sleep apnea?  Patient denies   Any hygiene concerns?  Patient denies   Independent of bathing and dressing?  Endorsed  Does the patient needs help with medications? Patient in charge   Who is in charge of the finances?  Patient is in charge and  thinking that her daughter should take over the bills. Any changes in appetite?  Patient denies "We eat everything is on the table"   Patient have trouble swallowing? Patient denies   Does the patient cook?  Patient cooks together with her husband, he is a Biomedical scientist Any kitchen accidents such as leaving the stove on? Patient denies   Any headaches?  Very seldom she has a headache controlled with Tylenol   Vision changes? Patient denies  Any focal numbness or tingling?  Patient denies   Chronic pain takes a shot in the knee on a regular basis due to arthritis   Unilateral weakness?  Patient denies   Any tremors?  Patient denies   Any history of anosmia?  Patient denies   Any incontinence of urine?  Patient denies   Any bowel dysfunction?   Patient denies   History of heavy alcohol intake?  Patient denies   History of heavy tobacco use?  Patient denies   Family history of dementia?  Patient denies  Patient lives with her husband    Recent labs September 2023 A1c 8.1, MCV 75.9 (chronic low  MCV), TSH 1.61    08/20/2022 MRI of the brain  personally reviewed, remarkable for no acute findings, mild generalized age-related volume loss, without lobar predominance, minimal small vessel changes of the pons, right thalamus and hemispheric white matter  CURRENT MEDICATIONS:  Outpatient Encounter Medications as of 08/23/2022  Medication Sig   celecoxib (CELEBREX) 200 MG capsule TAKE 1 CAPSULE EVERY DAY AS NEEDED   cetirizine (ZYRTEC) 10 MG tablet Take 1 tablet (10 mg total) by mouth daily as needed.   cholecalciferol (VITAMIN D3) 10 MCG (400 UNIT) TABS tablet Take 1,000 Units by mouth daily.   fluticasone (FLONASE) 50 MCG/ACT nasal spray Place 2 sprays into both nostrils daily.   furosemide (LASIX) 20 MG tablet TAKE 1 TO 2 TABLETS EVERY DAY   memantine (NAMENDA) 5 MG tablet Take 1 tablet (5 mg total) by mouth at bedtime.   Multiple Vitamin (MULTIVITAMIN) tablet Take 1 tablet by mouth daily.   pantoprazole (PROTONIX) 40 MG tablet TAKE 1 TABLET EVERY DAY   potassium chloride SA (KLOR-CON M) 20 MEQ tablet Take 1 tablet (20 mEq total) by mouth daily.   repaglinide (PRANDIN) 1 MG tablet Take 1 tablet (1 mg total) by mouth 3 (three) times daily before meals.   Semaglutide (RYBELSUS) 3 MG TABS Take 3 mg by mouth daily.   triamterene-hydrochlorothiazide (MAXZIDE-25) 37.5-25 MG tablet TAKE 1 TABLET EVERY DAY   trolamine salicylate (ASPERCREME) 10 % cream Apply 1 application topically 2 (two) times daily as needed for muscle pain.   No facility-administered encounter medications on file as of 08/23/2022.       09/09/2017   10:40 AM  MMSE - Mini Mental State Exam  Orientation to time 5  Orientation to Place 5  Registration 3  Attention/ Calculation 5  Recall 2  Language- name 2 objects 2  Language- repeat 1  Language- follow 3 step command 3  Language- read & follow direction 1  Write a sentence 1  Copy design 1  Total score 29      07/27/2022    9:52 AM 07/22/2022    1:00 PM  [place]  Cognitive Assessment   Visuospatial/ Executive (0/5) 1 1  Naming (0/3) 2 2  Attention: Read list of digits (0/2) 2 2  Attention: Read list of letters (0/1) 1 1  Attention: Serial 7 subtraction starting at 100 (0/3) 1 0  Language: Repeat phrase (0/2) 1 1  Language : Fluency (0/1) 1 1  Abstraction (0/2) 2 2  Delayed Recall (0/5) 3 3  Orientation (0/6) 6 6  Total 20 19  Adjusted Score (based on education) 20 19   Thank you for allowing [place] the opportunity to participate in the care of this nice patient. Please do not hesitate to contact us for any questions or concerns.   Total time spent on  today's visit was 25 minutes dedicated to this patient today, preparing to see patient, examining the patient, ordering tests and/or medications and counseling the patient, documenting clinical information in the EHR or other health record, independently interpreting results and communicating results to the patient/family, discussing treatment and goals, answering patient's questions and coordinating care.  Cc:  Plotnikov, Evie Lacks, MD  Sharene Butters 08/23/2022 12:28 PM

## 2022-09-09 ENCOUNTER — Encounter (HOSPITAL_BASED_OUTPATIENT_CLINIC_OR_DEPARTMENT_OTHER): Payer: Self-pay

## 2022-09-09 ENCOUNTER — Emergency Department (HOSPITAL_BASED_OUTPATIENT_CLINIC_OR_DEPARTMENT_OTHER): Payer: Medicare HMO

## 2022-09-09 ENCOUNTER — Emergency Department (HOSPITAL_BASED_OUTPATIENT_CLINIC_OR_DEPARTMENT_OTHER)
Admission: EM | Admit: 2022-09-09 | Discharge: 2022-09-09 | Disposition: A | Discharge status: Home / Self Care | Payer: Medicare HMO | Attending: Emergency Medicine | Admitting: Emergency Medicine

## 2022-09-09 ENCOUNTER — Other Ambulatory Visit: Payer: Self-pay

## 2022-09-09 DIAGNOSIS — Z79899 Other long term (current) drug therapy: Secondary | ICD-10-CM | POA: Insufficient documentation

## 2022-09-09 DIAGNOSIS — S01419A Laceration without foreign body of unspecified cheek and temporomandibular area, initial encounter: Secondary | ICD-10-CM | POA: Diagnosis not present

## 2022-09-09 DIAGNOSIS — S0012XA Contusion of left eyelid and periocular area, initial encounter: Secondary | ICD-10-CM | POA: Diagnosis not present

## 2022-09-09 DIAGNOSIS — H1132 Conjunctival hemorrhage, left eye: Secondary | ICD-10-CM | POA: Insufficient documentation

## 2022-09-09 DIAGNOSIS — W540XXA Bitten by dog, initial encounter: Secondary | ICD-10-CM | POA: Insufficient documentation

## 2022-09-09 DIAGNOSIS — S01112A Laceration without foreign body of left eyelid and periocular area, initial encounter: Secondary | ICD-10-CM | POA: Diagnosis not present

## 2022-09-09 DIAGNOSIS — S0083XA Contusion of other part of head, initial encounter: Secondary | ICD-10-CM

## 2022-09-09 DIAGNOSIS — S0990XA Unspecified injury of head, initial encounter: Secondary | ICD-10-CM | POA: Diagnosis present

## 2022-09-09 DIAGNOSIS — E119 Type 2 diabetes mellitus without complications: Secondary | ICD-10-CM | POA: Diagnosis not present

## 2022-09-09 DIAGNOSIS — Z23 Encounter for immunization: Secondary | ICD-10-CM | POA: Diagnosis not present

## 2022-09-09 DIAGNOSIS — S0181XA Laceration without foreign body of other part of head, initial encounter: Secondary | ICD-10-CM

## 2022-09-09 DIAGNOSIS — I10 Essential (primary) hypertension: Secondary | ICD-10-CM | POA: Insufficient documentation

## 2022-09-09 MED ORDER — AMOXICILLIN-POT CLAVULANATE 500-125 MG PO TABS: 1.0000 | ORAL_TABLET | Freq: Three times a day (TID) | ORAL | 0 refills | Status: DC

## 2022-09-09 MED ORDER — AMOXICILLIN-POT CLAVULANATE 875-125 MG PO TABS
1.0000 | ORAL_TABLET | Freq: Once | ORAL | Status: AC
  Administered 2022-09-09: 1 via ORAL
  Filled 2022-09-09: qty 1

## 2022-09-09 MED ORDER — TETANUS-DIPHTH-ACELL PERTUSSIS 5-2.5-18.5 LF-MCG/0.5 IM SUSY
0.5000 mL | PREFILLED_SYRINGE | Freq: Once | INTRAMUSCULAR | Status: AC
  Administered 2022-09-09: 0.5 mL via INTRAMUSCULAR
  Filled 2022-09-09: qty 0.5

## 2022-09-09 NOTE — Discharge Instructions (Signed)
Begin taking Augmentin as prescribed.  Local wound care with bacitracin twice daily.  Return to the emergency department if you develop increased redness, increased swelling, pus draining from the wound, or for other new and concerning symptoms.

## 2022-09-09 NOTE — ED Provider Notes (Signed)
[facility] Provider Note   CSN: [id number] Arrival date & time: 09/09/22  0005     History  Chief Complaint  Patient presents with   Animal Bite    Monica Horn is a 77 y.o. female.  Patient is a 77 year old female with history of type 2 diabetes, hyperlipidemia, hypertension.  Patient presenting today for evaluation of a dog bite.  Patient reports that her son passed away 2 days ago.  She went to his home to assess his belongings when his roommate's dog attacked her.  She was bitten to the left side of the head and left leg.  Patient's last tetanus shot greater than 5 years.  Not much is known about the dog as the owner seems to have disappeared after the dog attack.  The history is provided by the patient.  Animal Bite Contact animal:  Dog Location:  Head/neck and face      Home Medications Prior to Admission medications   Medication Sig Start Date End Date Taking? Authorizing Provider  celecoxib (CELEBREX) 200 MG capsule TAKE 1 CAPSULE EVERY DAY AS NEEDED 06/24/22   Plotnikov, Evie Lacks, MD  cetirizine (ZYRTEC) 10 MG tablet Take 1 tablet (10 mg total) by mouth daily as needed. 08/06/21   Plotnikov, Evie Lacks, MD  cholecalciferol (VITAMIN D3) 10 MCG (400 UNIT) TABS tablet Take 1,000 Units by mouth daily.    [provider]  fluticasone (FLONASE) 50 MCG/ACT nasal spray Place 2 sprays into both nostrils daily. 08/06/21   Plotnikov, Evie Lacks, MD  furosemide (LASIX) 20 MG tablet TAKE 1 TO 2 TABLETS EVERY DAY 09/14/21   Plotnikov, Evie Lacks, MD  memantine (NAMENDA) 5 MG tablet Take 1 tablet (5 mg total) by mouth 2 (two) times daily. 08/23/22   Rondel Jumbo, PA-C  Multiple Vitamin (MULTIVITAMIN) tablet Take 1 tablet by mouth daily.    [provider]  pantoprazole (PROTONIX) 40 MG tablet TAKE 1 TABLET EVERY DAY 09/14/21   Plotnikov, Evie Lacks, MD  potassium chloride SA (KLOR-CON M) 20 MEQ tablet Take 1 tablet (20 mEq  total) by mouth daily. 01/07/22   Plotnikov, Evie Lacks, MD  repaglinide (PRANDIN) 1 MG tablet Take 1 tablet (1 mg total) by mouth 3 (three) times daily before meals. Patient not taking: Reported on 08/23/2022 08/17/22   Plotnikov, Evie Lacks, MD  Semaglutide (RYBELSUS) 3 MG TABS Take 3 mg by mouth daily. Patient not taking: Reported on 08/23/2022 08/17/22   Plotnikov, Evie Lacks, MD  triamterene-hydrochlorothiazide (MAXZIDE-25) 37.5-25 MG tablet TAKE 1 TABLET EVERY DAY 09/14/21   Plotnikov, Evie Lacks, MD  trolamine salicylate (ASPERCREME) 10 % cream Apply 1 application topically 2 (two) times daily as needed for muscle pain.    [provider]      Allergies    Amlodipine, Ibuprofen, Benazepril, Losartan, and Lovastatin    Review of Systems   Review of Systems  All other systems reviewed and are negative.   Physical Exam Updated Vital Signs BP (!) 169/100 (BP Location: Right Arm)   Pulse 100   Temp 97.6 F (36.4 C) (Tympanic)   Resp 18   Ht '5\' 1"'$  (1.549 m)   Wt 78 kg   SpO2 97%   BMI 32.49 kg/m  Physical Exam Vitals and nursing note reviewed.  HENT:     Head: Normocephalic.     Comments: There is a 2.5 cm, superficial, linear laceration to the left upper forehead.  Bleeding is controlled  and wound is well-approximated. Eyes:     Extraocular Movements: Extraocular movements intact.     Pupils: Pupils are equal, round, and reactive to light.     Comments: There is swelling of the periorbital tissues of the left eye.  There is a small puncture wound noted overlying the zygoma.  There is also a subconjunctival hemorrhage noted laterally on the left eye.  Pupil is are equally round and reactive.  Anterior chamber and cornea appear clear to my exam.  She is describing no visual deficits.  Pulmonary:     Effort: Pulmonary effort is normal.  Skin:    General: Skin is warm and dry.  Neurological:     General: No focal deficit present.     Mental Status: She is alert and oriented to  person, place, and time.     ED Results / Procedures / Treatments   Labs (all labs ordered are listed, but only abnormal results are displayed) Labs Reviewed - No data to display  EKG None  Radiology No results found.  Procedures Procedures    Medications Ordered in ED Medications  Tdap (BOOSTRIX) injection 0.5 mL (has no administration in time range)  amoxicillin-clavulanate (AUGMENTIN) 875-125 MG per tablet 1 tablet (has no administration in time range)    ED Course/ Medical Decision Making/ A&P  Patient presenting with complaints of facial injuries sustained after a dog attacked her.  The details of this are described in the HPI.  She sustained a conjunctival hemorrhage to the left eye as well as multiple superficial punctures and 1 laceration to the scalp and periorbital tissue.  The laceration to the scalp was approximated using Steri-Strips.  I obtained a CT scan of the maxillofacial bones just to ensure the periorbital swelling was not related to damage to underlying bony structures.  This was negative.  She also has a some conjunctival hemorrhage, but no other evidence for injury to the eye.  She has no visual complaints, pupil is reactive, and anterior chamber is clear.  The cornea also appears normal.  Patient to be discharged with local wound care, Augmentin, and as needed return.  Her tetanus was updated.  Patient understands that the dog is to be quarantined for 10 days and that she should return to initiate rabies vaccine if this is not possible.  Final Clinical Impression(s) / ED Diagnoses Final diagnoses:  None    Rx / DC Orders ED Discharge Orders     None         Veryl Speak, MD 09/09/22 [phone]

## 2022-09-09 NOTE — ED Triage Notes (Signed)
Pt went to son's house and when she opened the door a friend's dog bit her.  Pt has 4-5 lacerations noted to head and cheek.  Left eye is swollen  Unknown if dog has been vaccinated

## 2022-09-12 ENCOUNTER — Ambulatory Visit (HOSPITAL_COMMUNITY)
Admission: EM | Admit: 2022-09-12 | Discharge: 2022-09-12 | Disposition: A | Discharge status: Home / Self Care | Payer: Medicare HMO | Attending: Family Medicine | Admitting: Family Medicine

## 2022-09-12 ENCOUNTER — Encounter (HOSPITAL_COMMUNITY): Payer: Self-pay

## 2022-09-12 DIAGNOSIS — Z203 Contact with and (suspected) exposure to rabies: Secondary | ICD-10-CM | POA: Diagnosis not present

## 2022-09-12 DIAGNOSIS — S0181XD Laceration without foreign body of other part of head, subsequent encounter: Secondary | ICD-10-CM | POA: Diagnosis not present

## 2022-09-12 DIAGNOSIS — Z23 Encounter for immunization: Secondary | ICD-10-CM | POA: Diagnosis not present

## 2022-09-12 MED ORDER — RABIES IMMUNE GLOBULIN 150 UNIT/ML IM INJ
INJECTION | INTRAMUSCULAR | Status: AC
  Filled 2022-09-12: qty 2

## 2022-09-12 MED ORDER — RABIES VACCINE, PCEC IM SUSR
INTRAMUSCULAR | Status: AC
  Filled 2022-09-12: qty 1

## 2022-09-12 MED ORDER — RABIES IMMUNE GLOBULIN 150 UNIT/ML IM INJ
INJECTION | INTRAMUSCULAR | Status: AC
  Filled 2022-09-12: qty 10

## 2022-09-12 MED ORDER — RABIES VACCINE, PCEC IM SUSR
1.0000 mL | Freq: Once | INTRAMUSCULAR | Status: AC
  Administered 2022-09-12: 1 mL via INTRAMUSCULAR

## 2022-09-12 MED ORDER — RABIES IMMUNE GLOBULIN 150 UNIT/ML IM INJ
20.0000 [IU]/kg | INJECTION | Freq: Once | INTRAMUSCULAR | Status: AC
  Administered 2022-09-12: 1575 [IU]

## 2022-09-12 NOTE — ED Provider Notes (Signed)
[facility]    CSN: [id number] Arrival date & time: 09/12/22  1001      History   Chief Complaint Chief Complaint  Patient presents with   Animal Bite    HPI Monica Horn is a 77 y.o. female.    Animal Bite  Here for possible rabies exposure and postexposure prophylaxis.  On January 25 she had received notification from the hospital that her son had passed away.  She went to his house and when entering a dog attacked her.  It turned out this was a roommate of her son, which she is unaware of.  The owner of the dog arrived after the attack and ran off with the dog in his possession.  She then went to the emergency room and was assessed.  Steri-Strips were used to close her wound and she was provided antibiotic and tetanus vaccination.  She was told that if she did not find the dog, she would need rabies vaccination.  Past Medical History:  Diagnosis Date   Abnormal EKG 2014   NONSPECIFIC ST DEPRESSION + T ANORMALITY, WORKED UP 2014 DR Crissie Sickles AND RELEASED BY DR Lovena Le   Allergy    Arthritis    Blood transfusion without reported diagnosis    Colon polyps    Diabetes mellitus    PT. 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   w/ focal high grade dysplasia, no carcinoma    Patient Active Problem List   Diagnosis Date Noted   Memory impairment 05/16/2022   Osteoarthritis of right knee 04/25/2022   Chronic knee pain after total replacement of right knee joint 04/21/2022   Acute sinusitis 07/01/2021   Tennis elbow 01/13/2021   Knee pain, right 11/18/2020   Statin intolerance 07/29/2020   Lab test positive for detection of COVID-19 virus 10/04/2019   Ankle swelling 04/23/2019   History  of revision of total replacement of left hip joint 11/17/2018   OA (osteoarthritis) of hip 10/18/2018   Hip pain 10/02/2018   Gastric polyp 07/18/2018   Colon polyps 07/18/2018   Nose fracture 12/17/2016   Closed undisplaced fracture of nasal bone 12/09/2016   Forehead contusion, initial encounter 11/19/2016   Well adult exam 04/09/2016   Ganglion cyst 11/11/2015   Allergic rhinitis 11/11/2015   Cough 10/24/2014   Ingrown right big toenail 09/27/2014   Arthralgia 07/02/2014   Rash and nonspecific skin eruption 04/05/2014   Eruption cyst 04/05/2014   UTI (urinary tract infection) 01/04/2014   Grief 05/10/2013   Near syncope 10/13/2012   Chest pain, atypical 08/26/2012   GERD (gastroesophageal reflux disease) 08/26/2012   Abnormal EKG 08/26/2012   Foot pain 06/13/2012   URI, acute 06/11/2011   Finger contusion 11/30/2010   Shoulder pain, right 11/30/2010   Otitis externa 07/27/2010   Cerumen impaction 12/01/2009   CYSTITIS 09/08/2009   DIZZINESS 04/04/2009   CELLULITIS AND ABSCESS OF OTHER SPECIFIED SITE 01/31/2009   ACUTE TONSILLITIS 08/05/2008   Edema 03/22/2008   ANEMIA 10/27/2007   Carpal tunnel syndrome 10/27/2007   HEMORRHOIDS 10/27/2007   Blood in stool 10/27/2007   Hemorrhage of gastrointestinal tract 10/27/2007   Osteoarthritis 10/27/2007  Hematochezia 10/27/2007   Other abnormal glucose 10/24/2007   Abnormal glucose level 10/24/2007   HTN (hypertension) 07/11/2007   History of colonic polyps 07/11/2007   Dyslipidemia 06/20/2007   Diabetes type 2, controlled ([place]) 06/12/2007    Past Surgical History:  Procedure Laterality Date   CARPAL TUNNEL RELEASE     Lt  wrist, rt wrist 2014   COLONOSCOPY     POLYPECTOMY     TOTAL HIP ARTHROPLASTY Left 10/18/2018   Procedure: TOTAL HIP ARTHROPLASTY ANTERIOR APPROACH;  Surgeon: Gaynelle Arabian, MD;  Location: [facility];  Service: Orthopedics;  Laterality: Left;  127mn   TUBAL LIGATION      OB History   No obstetric  history on file.      Home Medications    Prior to Admission medications   Medication Sig Start Date End Date Taking? Authorizing Provider  amoxicillin-clavulanate (AUGMENTIN) 500-125 MG tablet Take 1 tablet by mouth every 8 (eight) hours. 09/09/22  Yes Delo, DNathaneil Canary MD  celecoxib (CELEBREX) 200 MG capsule TAKE 1 CAPSULE EVERY DAY AS NEEDED 06/24/22  Yes Plotnikov, AEvie Lacks MD  cetirizine (ZYRTEC) 10 MG tablet Take 1 tablet (10 mg total) by mouth daily as needed. 08/06/21  Yes Plotnikov, AEvie Lacks MD  cholecalciferol (VITAMIN D3) 10 MCG (400 UNIT) TABS tablet Take 1,000 Units by mouth daily.   Yes [provider]  fluticasone (FLONASE) 50 MCG/ACT nasal spray Place 2 sprays into both nostrils daily. 08/06/21  Yes Plotnikov, AEvie Lacks MD  furosemide (LASIX) 20 MG tablet TAKE 1 TO 2 TABLETS EVERY DAY 09/14/21  Yes Plotnikov, AEvie Lacks MD  memantine (NAMENDA) 5 MG tablet Take 1 tablet (5 mg total) by mouth 2 (two) times daily. 08/23/22  Yes WRondel Jumbo PA-C  Multiple Vitamin (MULTIVITAMIN) tablet Take 1 tablet by mouth daily.   Yes [provider]  pantoprazole (PROTONIX) 40 MG tablet TAKE 1 TABLET EVERY DAY 09/14/21  Yes Plotnikov, AEvie Lacks MD  potassium chloride SA (KLOR-CON M) 20 MEQ tablet Take 1 tablet (20 mEq total) by mouth daily. 01/07/22  Yes Plotnikov, AEvie Lacks MD  triamterene-hydrochlorothiazide (MAXZIDE-25) 37.5-25 MG tablet TAKE 1 TABLET EVERY DAY 09/14/21  Yes Plotnikov, AEvie Lacks MD  trolamine salicylate (ASPERCREME) 10 % cream Apply 1 application topically 2 (two) times daily as needed for muscle pain.   Yes [provider]  repaglinide (PRANDIN) 1 MG tablet Take 1 tablet (1 mg total) by mouth 3 (three) times daily before meals. Patient not taking: Reported on 08/23/2022 08/17/22   Plotnikov, AEvie Lacks MD  Semaglutide (RYBELSUS) 3 MG TABS Take 3 mg by mouth daily. Patient not taking: Reported on 08/23/2022 08/17/22   Plotnikov, AEvie Lacks MD     Family History Family History  Problem Relation Age of Onset   Stroke Mother    Hypertension Mother    Lung cancer Mother    Cancer Father        ? liver ca   Alcohol abuse Father    Hypertension Other    Stroke Other    Colon cancer Neg Hx    Colon polyps Neg Hx    Rectal cancer Neg Hx    Stomach cancer Neg Hx     Social History Social History   Tobacco Use   Smoking status: Former    Types: Cigarettes    Quit date: 1979    Years since quitting: 45.1   Smokeless tobacco: Never  Vaping Use   Vaping Use: Never used  Substance Use Topics   Alcohol use: Yes    Comment: occasional   Drug use: No     Allergies   Amlodipine, Ibuprofen, Benazepril, Losartan, and Lovastatin   Review of Systems Review of Systems   Physical Exam Triage Vital Signs ED Triage Vitals  Enc Vitals Group     BP 09/12/22 1028 116/64     Pulse Rate 09/12/22 1028 97     Resp 09/12/22 1028 16     Temp 09/12/22 1028 98.3 F (36.8 C)     Temp Source 09/12/22 1028 Oral     SpO2 09/12/22 1028 96 %     Weight 09/12/22 1034 173 lb 12.8 oz (78.8 kg)     Height --      Head Circumference --      Peak Flow --      Pain Score 09/12/22 1024 0     Pain Loc --      Pain Edu? --      Excl. in [place]? --    No data found.  Updated Vital Signs BP 116/64 (BP Location: Left Arm)   Pulse 97   Temp 98.3 F (36.8 C) (Oral)   Resp 16   Wt 78.8 kg   SpO2 96%   BMI 32.84 kg/m   Visual Acuity Right Eye Distance:   Left Eye Distance:   Bilateral Distance:    Right Eye Near:   Left Eye Near:    Bilateral Near:     Physical Exam Vitals reviewed.  Constitutional:      General: She is not in acute distress.    Appearance: She is not ill-appearing, toxic-appearing or diaphoretic.  Cardiovascular:     Rate and Rhythm: Normal rate and regular rhythm.  Skin:    Coloration: Skin is not pale.     Comments: Puncture wounds on her cheek are healing well.  There is no swelling or induration.   Also the Steri-Strips laceration along her forehead is healing well.  There are no signs of secondary infection.  Also on her left anterior leg there are a couple of abrasions that are bites also they are healing well.  There is no sign of secondary infection there either.  Neurological:     General: No focal deficit present.     Mental Status: She is alert and oriented to person, place, and time.  Psychiatric:        Behavior: Behavior normal.      UC Treatments / Results  Labs (all labs ordered are listed, but only abnormal results are displayed) Labs Reviewed - No data to display  EKG   Radiology No results found.  Procedures Procedures (including critical care time)  Medications Ordered in UC Medications  rabies immune globulin (HYPERRAB/KEDRAB) injection 1,575 Units (has no administration in time range)  rabies vaccine (RABAVERT) injection 1 mL (has no administration in time range)    Initial Impression / Assessment and Plan / UC Course  I have reviewed the triage vital signs and the nursing notes.  Pertinent labs & imaging results that were available during my care of the patient were reviewed by me and considered in my medical decision making (see chart for details).        Rabies immunoglobulin is administered in the wound sites and IM.  Rabies vaccine day 0 dose is given today. Final Clinical Impressions(s) / UC Diagnoses   Final diagnoses:  Facial laceration, subsequent encounter  Need for post exposure prophylaxis for  rabies     Discharge Instructions      You have been given immunoglobulin against rabies or passive antibodies.  You have been given the first dose of rabies vaccine also today.  We are scheduling an appointment for you to have your next rabies vaccine here on 09/15/2022   (You will then have a scheduled appointment for 7 and 14 days from today, made at subsequent appointments)      ED Prescriptions   None    PDMP not  reviewed this encounter.   Barrett Henle, MD 09/12/22 1102

## 2022-09-12 NOTE — ED Notes (Signed)
DC papers reviewed with Pt and spouse .

## 2022-09-12 NOTE — ED Triage Notes (Signed)
Pt was bit by a dog on head , cheek , left eye swollen. Pt states she was informed if she does not find dog within three days she should come for rabies vaccine . Pt stated she is on antibiotics and received a tetanus vaccine already.

## 2022-09-12 NOTE — Discharge Instructions (Addendum)
You have been given immunoglobulin against rabies or passive antibodies.  You have been given the first dose of rabies vaccine also today. You will need a total of 4 doses of the vaccine--Today (day 0), day 3, day 7 and day 14.   We are scheduling an appointment for you to have your next rabies vaccine here on 09/15/2022   (You will then have a scheduled appointment for 7 and 14 days from today, made at subsequent appointments)

## 2022-09-15 ENCOUNTER — Ambulatory Visit (HOSPITAL_COMMUNITY)
Admission: RE | Admit: 2022-09-15 | Discharge: 2022-09-15 | Disposition: A | Discharge status: Home / Self Care | Payer: Medicare HMO | Source: Ambulatory Visit | Attending: Internal Medicine | Admitting: Internal Medicine

## 2022-09-15 DIAGNOSIS — Z23 Encounter for immunization: Secondary | ICD-10-CM

## 2022-09-15 DIAGNOSIS — Z203 Contact with and (suspected) exposure to rabies: Secondary | ICD-10-CM

## 2022-09-15 MED ORDER — RABIES VACCINE, PCEC IM SUSR
1.0000 mL | Freq: Once | INTRAMUSCULAR | Status: AC
  Administered 2022-09-15: 1 mL via INTRAMUSCULAR

## 2022-09-15 MED ORDER — RABIES VACCINE, PCEC IM SUSR
INTRAMUSCULAR | Status: AC
  Filled 2022-09-15: qty 1

## 2022-09-15 NOTE — ED Triage Notes (Signed)
Pt here for 2nd rabies injection. Denies any other concerns at this time.

## 2022-09-19 ENCOUNTER — Encounter (HOSPITAL_COMMUNITY): Payer: Self-pay

## 2022-09-19 ENCOUNTER — Ambulatory Visit (HOSPITAL_COMMUNITY)
Admission: EM | Admit: 2022-09-19 | Discharge: 2022-09-19 | Disposition: A | Discharge status: Home / Self Care | Payer: Medicare HMO | Attending: Internal Medicine | Admitting: Internal Medicine

## 2022-09-19 DIAGNOSIS — Z203 Contact with and (suspected) exposure to rabies: Secondary | ICD-10-CM

## 2022-09-19 DIAGNOSIS — Z23 Encounter for immunization: Secondary | ICD-10-CM | POA: Diagnosis not present

## 2022-09-19 MED ORDER — RABIES VACCINE, PCEC IM SUSR
1.0000 mL | Freq: Once | INTRAMUSCULAR | Status: AC
  Administered 2022-09-19: 1 mL via INTRAMUSCULAR

## 2022-09-19 MED ORDER — RABIES VACCINE, PCEC IM SUSR
INTRAMUSCULAR | Status: AC
  Filled 2022-09-19: qty 1

## 2022-09-19 NOTE — ED Triage Notes (Signed)
Pt is here for 3rd rabies injection. Denies any concerns at this time.

## 2022-09-19 NOTE — Discharge Instructions (Signed)
You received your rabies vaccine series injections on 1/28 (day 0), 1/31, and today 2/4. You need to return on 09/26/2022 for your final rabies vaccine injection.

## 2022-09-22 ENCOUNTER — Ambulatory Visit
Admission: RE | Admit: 2022-09-22 | Discharge: 2022-09-22 | Disposition: A | Discharge status: Home / Self Care | Payer: Medicare HMO | Source: Ambulatory Visit | Attending: Internal Medicine | Admitting: Internal Medicine

## 2022-09-22 DIAGNOSIS — Z1231 Encounter for screening mammogram for malignant neoplasm of breast: Secondary | ICD-10-CM | POA: Diagnosis not present

## 2022-09-26 ENCOUNTER — Encounter (HOSPITAL_COMMUNITY): Payer: Self-pay

## 2022-09-26 ENCOUNTER — Ambulatory Visit (HOSPITAL_COMMUNITY)
Admission: EM | Admit: 2022-09-26 | Discharge: 2022-09-26 | Disposition: A | Discharge status: Home / Self Care | Payer: Medicare HMO | Attending: Internal Medicine | Admitting: Internal Medicine

## 2022-09-26 DIAGNOSIS — Z23 Encounter for immunization: Secondary | ICD-10-CM | POA: Diagnosis not present

## 2022-09-26 DIAGNOSIS — Z203 Contact with and (suspected) exposure to rabies: Secondary | ICD-10-CM | POA: Diagnosis not present

## 2022-09-26 MED ORDER — RABIES VACCINE, PCEC IM SUSR
INTRAMUSCULAR | Status: AC
  Filled 2022-09-26: qty 1

## 2022-09-26 MED ORDER — RABIES VACCINE, PCEC IM SUSR
1.0000 mL | Freq: Once | INTRAMUSCULAR | Status: AC
  Administered 2022-09-26: 1 mL via INTRAMUSCULAR

## 2022-09-26 NOTE — ED Triage Notes (Signed)
Pt here to get 4th rabies shot. Denies any concerns at this time.

## 2022-09-27 ENCOUNTER — Encounter: Payer: Self-pay | Admitting: Internal Medicine

## 2022-09-27 ENCOUNTER — Ambulatory Visit (INDEPENDENT_AMBULATORY_CARE_PROVIDER_SITE_OTHER): Payer: Medicare HMO | Admitting: Internal Medicine

## 2022-09-27 VITALS — BP 128/82 | HR 105 | Temp 98.2°F | Ht 61.0 in | Wt 173.0 lb

## 2022-09-27 DIAGNOSIS — W540XXA Bitten by dog, initial encounter: Secondary | ICD-10-CM

## 2022-09-27 DIAGNOSIS — F4321 Adjustment disorder with depressed mood: Secondary | ICD-10-CM | POA: Diagnosis not present

## 2022-09-27 DIAGNOSIS — S0181XA Laceration without foreign body of other part of head, initial encounter: Secondary | ICD-10-CM

## 2022-09-27 DIAGNOSIS — M25511 Pain in right shoulder: Secondary | ICD-10-CM

## 2022-09-27 DIAGNOSIS — W540XXD Bitten by dog, subsequent encounter: Secondary | ICD-10-CM

## 2022-09-27 HISTORY — DX: Bitten by dog, initial encounter: W54.0XXA

## 2022-09-27 NOTE — Assessment & Plan Note (Addendum)
Face 09/2022 S/p Rabies shots Arnica gel

## 2022-09-27 NOTE — Progress Notes (Signed)
Grief, dog wou  Subjective:  Patient ID: Monica Horn, female    DOB: 12-22-1945  Age: 77 y.o. MRN: [id number]  CC: Weakness (Started a couple weeks ago in right arm and has been getting rabies shot)   HPI Monica Horn presents for grief, dog bites, s/p rabies shots. C/o R shoulder pain and weakness  Outpatient Medications Prior to Visit  Medication Sig Dispense Refill   amoxicillin-clavulanate (AUGMENTIN) 500-125 MG tablet Take 1 tablet by mouth every 8 (eight) hours. 21 tablet 0   celecoxib (CELEBREX) 200 MG capsule TAKE 1 CAPSULE EVERY DAY AS NEEDED 90 capsule 1   cetirizine (ZYRTEC) 10 MG tablet Take 1 tablet (10 mg total) by mouth daily as needed. 90 tablet 3   cholecalciferol (VITAMIN D3) 10 MCG (400 UNIT) TABS tablet Take 1,000 Units by mouth daily.     fluticasone (FLONASE) 50 MCG/ACT nasal spray Place 2 sprays into both nostrils daily. 16 g 6   furosemide (LASIX) 20 MG tablet TAKE 1 TO 2 TABLETS EVERY DAY 180 tablet 3   memantine (NAMENDA) 5 MG tablet Take 1 tablet (5 mg total) by mouth 2 (two) times daily. 60 tablet 11   Multiple Vitamin (MULTIVITAMIN) tablet Take 1 tablet by mouth daily.     pantoprazole (PROTONIX) 40 MG tablet TAKE 1 TABLET EVERY DAY 90 tablet 3   potassium chloride SA (KLOR-CON M) 20 MEQ tablet Take 1 tablet (20 mEq total) by mouth daily. 90 tablet 3   repaglinide (PRANDIN) 1 MG tablet Take 1 tablet (1 mg total) by mouth 3 (three) times daily before meals. 270 tablet 3   Semaglutide (RYBELSUS) 3 MG TABS Take 3 mg by mouth daily. 30 tablet 1   triamterene-hydrochlorothiazide (MAXZIDE-25) 37.5-25 MG tablet TAKE 1 TABLET EVERY DAY 90 tablet 3   trolamine salicylate (ASPERCREME) 10 % cream Apply 1 application topically 2 (two) times daily as needed for muscle pain.     No facility-administered medications prior to visit.    ROS: Review of Systems  Constitutional:  Negative for activity change, appetite change, chills, fatigue and unexpected weight  change.  HENT:  Negative for congestion, mouth sores and sinus pressure.   Eyes:  Negative for visual disturbance.  Respiratory:  Negative for cough and chest tightness.   Gastrointestinal:  Negative for abdominal pain and nausea.  Genitourinary:  Negative for difficulty urinating, frequency and vaginal pain.  Musculoskeletal:  Negative for arthralgias, back pain and gait problem.  Skin:  Positive for wound. Negative for pallor and rash.  Neurological:  Negative for dizziness, tremors, weakness, numbness and headaches.  Psychiatric/Behavioral:  Negative for confusion and sleep disturbance. The patient is not nervous/anxious.     Objective:  BP 128/82 (BP Location: Right Arm, Patient Position: Sitting, Cuff Size: Normal)   Pulse (!) 105   Temp 98.2 F (36.8 C) (Oral)   Ht 5' 1"$  (1.549 m)   Wt 173 lb (78.5 kg)   SpO2 98%   BMI 32.69 kg/m   BP Readings from Last 3 Encounters:  09/27/22 128/82  09/15/22 (!) 156/93  09/12/22 116/64    Wt Readings from Last 3 Encounters:  09/27/22 173 lb (78.5 kg)  09/12/22 173 lb 12.8 oz (78.8 kg)  09/09/22 171 lb 15.3 oz (78 kg)    Physical Exam Constitutional:      General: She is not in acute distress.    Appearance: She is well-developed. She is obese.  HENT:     Head: Normocephalic.  Right Ear: External ear normal.     Left Ear: External ear normal.     Nose: Nose normal.  Eyes:     General:        Right eye: No discharge.        Left eye: No discharge.     Conjunctiva/sclera: Conjunctivae normal.     Pupils: Pupils are equal, round, and reactive to light.  Neck:     Thyroid: No thyromegaly.     Vascular: No JVD.     Trachea: No tracheal deviation.  Cardiovascular:     Rate and Rhythm: Normal rate and regular rhythm.     Heart sounds: Normal heart sounds.  Pulmonary:     Effort: No respiratory distress.     Breath sounds: No stridor. No wheezing.  Abdominal:     General: Bowel sounds are normal. There is no distension.      Palpations: Abdomen is soft. There is no mass.     Tenderness: There is no abdominal tenderness. There is no guarding or rebound.  Musculoskeletal:        General: Tenderness present.     Cervical back: Normal range of motion and neck supple. No rigidity.  Lymphadenopathy:     Cervical: No cervical adenopathy.  Skin:    Findings: No erythema or rash.  Neurological:     Cranial Nerves: No cranial nerve deficit.     Motor: No abnormal muscle tone.     Coordination: Coordination normal.     Deep Tendon Reflexes: Reflexes normal.  Psychiatric:        Behavior: Behavior normal.        Thought Content: Thought content normal.        Judgment: Judgment normal.   R shoulder w/pain Small hematomas over L forehead Sad  Lab Results  Component Value Date   WBC 7.4 01/06/2022   HGB 13.5 01/06/2022   HCT 43.8 01/06/2022   PLT 315.0 01/06/2022   GLUCOSE 232 (H) 05/12/2022   CHOL 192 06/24/2017   TRIG 126.0 06/24/2017   HDL 37.00 (L) 06/24/2017   LDLDIRECT 120.0 05/13/2015   LDLCALC 130 (H) 06/24/2017   ALT 29 05/12/2022   AST 22 05/12/2022   NA 136 05/12/2022   K 3.4 (L) 05/12/2022   CL 100 05/12/2022   CREATININE 1.25 (H) 05/12/2022   BUN 18 05/12/2022   CO2 31 05/12/2022   TSH 1.61 01/06/2022   INR 0.9 10/13/2018   HGBA1C 8.1 (H) 05/12/2022   MICROALBUR 1.5 02/06/2016    No results found.  Assessment & Plan:   Problem List Items Addressed This Visit       Other   Shoulder pain, right - Primary    Worse Arnica gel or cream on bruises Rice sock Blue-Emu cream was recommended  2-3 times a day      Grief    09/11/22 son died at 81 Discussed      Dog bite    Face 09/2022 S/p Rabies shots Arnica gel          No orders of the defined types were placed in this encounter.     Follow-up: Return for a follow-up visit.  Walker Kehr, MD

## 2022-09-27 NOTE — Assessment & Plan Note (Signed)
09-22-2022 son died at 22 Discussed

## 2022-09-27 NOTE — Patient Instructions (Addendum)
Arnica gel or cream on bruises Rice sock Blue-Emu cream was recommended  2-3 times a day on the shoulder

## 2022-09-27 NOTE — Assessment & Plan Note (Signed)
Worse Arnica gel or cream on bruises Rice sock Blue-Emu cream was recommended  2-3 times a day

## 2022-09-29 ENCOUNTER — Encounter: Payer: Medicare HMO | Admitting: Psychology

## 2022-10-06 ENCOUNTER — Encounter: Payer: Medicare HMO | Admitting: Psychology

## 2022-10-06 DIAGNOSIS — M25511 Pain in right shoulder: Secondary | ICD-10-CM | POA: Diagnosis not present

## 2022-10-24 ENCOUNTER — Other Ambulatory Visit: Payer: Self-pay | Admitting: Internal Medicine

## 2022-10-26 DIAGNOSIS — M25511 Pain in right shoulder: Secondary | ICD-10-CM | POA: Diagnosis not present

## 2022-11-08 DIAGNOSIS — M25512 Pain in left shoulder: Secondary | ICD-10-CM | POA: Diagnosis not present

## 2022-11-08 DIAGNOSIS — M25511 Pain in right shoulder: Secondary | ICD-10-CM | POA: Diagnosis not present

## 2022-11-15 DIAGNOSIS — M25511 Pain in right shoulder: Secondary | ICD-10-CM | POA: Diagnosis not present

## 2022-11-16 ENCOUNTER — Encounter: Payer: Self-pay | Admitting: Internal Medicine

## 2022-11-16 ENCOUNTER — Ambulatory Visit (INDEPENDENT_AMBULATORY_CARE_PROVIDER_SITE_OTHER): Payer: Medicare HMO | Admitting: Internal Medicine

## 2022-11-16 VITALS — BP 118/78 | HR 88 | Temp 98.6°F | Ht 61.0 in | Wt 173.0 lb

## 2022-11-16 DIAGNOSIS — F4321 Adjustment disorder with depressed mood: Secondary | ICD-10-CM

## 2022-11-16 DIAGNOSIS — I10 Essential (primary) hypertension: Secondary | ICD-10-CM

## 2022-11-16 DIAGNOSIS — E785 Hyperlipidemia, unspecified: Secondary | ICD-10-CM

## 2022-11-16 DIAGNOSIS — M25511 Pain in right shoulder: Secondary | ICD-10-CM | POA: Diagnosis not present

## 2022-11-16 DIAGNOSIS — E119 Type 2 diabetes mellitus without complications: Secondary | ICD-10-CM | POA: Diagnosis not present

## 2022-11-16 LAB — HEMOGLOBIN A1C: Hgb A1c MFr Bld: 7 % — ABNORMAL HIGH (ref 4.6–6.5)

## 2022-11-16 NOTE — Assessment & Plan Note (Signed)
R rot cuff strain - better S/p PT at [facility] Blue-Emu cream was recommended  2-3 times a day

## 2022-11-16 NOTE — Progress Notes (Signed)
Subjective:  Patient ID: Monica Horn, female    DOB: 1945/10/28  Age: 77 y.o. MRN: [id number]  CC: Follow-up (3 mnth f/u)   HPI Monica Horn presents for DM, HTN C/o R shoulder pain Brother in law  died Outpatient Medications Prior to Visit  Medication Sig Dispense Refill   celecoxib (CELEBREX) 200 MG capsule TAKE 1 CAPSULE EVERY DAY AS NEEDED 90 capsule 1   cetirizine (ZYRTEC) 10 MG tablet Take 1 tablet (10 mg total) by mouth daily as needed. 90 tablet 3   cholecalciferol (VITAMIN D3) 10 MCG (400 UNIT) TABS tablet Take 1,000 Units by mouth daily.     fluticasone (FLONASE) 50 MCG/ACT nasal spray INSTILL 2 SPRAYS INTO BOTH NOSTRILS ONCE DAILY 16 g 5   furosemide (LASIX) 20 MG tablet TAKE 1 TO 2 TABLETS EVERY DAY 180 tablet 3   memantine (NAMENDA) 5 MG tablet Take 1 tablet (5 mg total) by mouth 2 (two) times daily. 60 tablet 11   Multiple Vitamin (MULTIVITAMIN) tablet Take 1 tablet by mouth daily.     pantoprazole (PROTONIX) 40 MG tablet TAKE 1 TABLET EVERY DAY 90 tablet 3   potassium chloride SA (KLOR-CON M) 20 MEQ tablet Take 1 tablet (20 mEq total) by mouth daily. 90 tablet 3   repaglinide (PRANDIN) 1 MG tablet Take 1 tablet (1 mg total) by mouth 3 (three) times daily before meals. 270 tablet 3   triamterene-hydrochlorothiazide (MAXZIDE-25) 37.5-25 MG tablet TAKE 1 TABLET EVERY DAY 90 tablet 3   trolamine salicylate (ASPERCREME) 10 % cream Apply 1 application topically 2 (two) times daily as needed for muscle pain.     Semaglutide (RYBELSUS) 3 MG TABS Take 3 mg by mouth daily. 30 tablet 1   amoxicillin-clavulanate (AUGMENTIN) 500-125 MG tablet Take 1 tablet by mouth every 8 (eight) hours. 21 tablet 0   No facility-administered medications prior to visit.    ROS: Review of Systems  Constitutional:  Negative for activity change, appetite change, chills, fatigue and unexpected weight change.  HENT:  Negative for congestion, mouth sores and sinus pressure.   Eyes:  Negative for  visual disturbance.  Respiratory:  Negative for cough and chest tightness.   Gastrointestinal:  Negative for abdominal pain and nausea.  Genitourinary:  Negative for difficulty urinating, frequency and vaginal pain.  Musculoskeletal:  Positive for arthralgias. Negative for back pain and gait problem.  Skin:  Negative for pallor and rash.  Neurological:  Negative for dizziness, tremors, weakness, numbness and headaches.  Psychiatric/Behavioral:  Negative for confusion and sleep disturbance.     Objective:  BP 118/78 (BP Location: Right Arm, Patient Position: Sitting, Cuff Size: Large)   Pulse 88   Temp 98.6 F (37 C) (Oral)   Ht 5\' 1"  (1.549 m)   Wt 173 lb (78.5 kg)   SpO2 97%   BMI 32.69 kg/m   BP Readings from Last 3 Encounters:  11/16/22 118/78  09/27/22 128/82  09/15/22 (!) 156/93    Wt Readings from Last 3 Encounters:  11/16/22 173 lb (78.5 kg)  09/27/22 173 lb (78.5 kg)  09/12/22 173 lb 12.8 oz (78.8 kg)    Physical Exam Constitutional:      General: She is not in acute distress.    Appearance: She is well-developed. She is obese.  HENT:     Head: Normocephalic.     Right Ear: External ear normal.     Left Ear: External ear normal.     Nose: Nose normal.  Eyes:     General:        Right eye: No discharge.        Left eye: No discharge.     Conjunctiva/sclera: Conjunctivae normal.     Pupils: Pupils are equal, round, and reactive to light.  Neck:     Thyroid: No thyromegaly.     Vascular: No JVD.     Trachea: No tracheal deviation.  Cardiovascular:     Rate and Rhythm: Normal rate and regular rhythm.     Heart sounds: Normal heart sounds.  Pulmonary:     Effort: No respiratory distress.     Breath sounds: No stridor. No wheezing.  Abdominal:     General: Bowel sounds are normal. There is no distension.     Palpations: Abdomen is soft. There is no mass.     Tenderness: There is no abdominal tenderness. There is no guarding or rebound.  Musculoskeletal:      Cervical back: Normal range of motion and neck supple. Tenderness present. No rigidity.  Lymphadenopathy:     Cervical: No cervical adenopathy.  Skin:    Findings: No erythema or rash.  Neurological:     Cranial Nerves: No cranial nerve deficit.     Motor: No abnormal muscle tone.     Coordination: Coordination normal.     Deep Tendon Reflexes: Reflexes normal.  Psychiatric:        Behavior: Behavior normal.        Thought Content: Thought content normal.        Judgment: Judgment normal.     Lab Results  Component Value Date   WBC 7.4 01/06/2022   HGB 13.5 01/06/2022   HCT 43.8 01/06/2022   PLT 315.0 01/06/2022   GLUCOSE 232 (H) 05/12/2022   CHOL 192 06/24/2017   TRIG 126.0 06/24/2017   HDL 37.00 (L) 06/24/2017   LDLDIRECT 120.0 05/13/2015   LDLCALC 130 (H) 06/24/2017   ALT 29 05/12/2022   AST 22 05/12/2022   NA 136 05/12/2022   K 3.4 (L) 05/12/2022   CL 100 05/12/2022   CREATININE 1.25 (H) 05/12/2022   BUN 18 05/12/2022   CO2 31 05/12/2022   TSH 1.61 01/06/2022   INR 0.9 10/13/2018   HGBA1C 8.1 (H) 05/12/2022   MICROALBUR 1.5 02/06/2016    No results found.  Assessment & Plan:   Problem List Items Addressed This Visit       Cardiovascular and Mediastinum   HTN (hypertension)    Cont on Maxzide      Relevant Orders   Lipid panel   Microalbumin / creatinine urine ratio   Comprehensive metabolic panel   Hemoglobin A1c     Endocrine   Diabetes type 2, controlled - Primary     Repaginate - re-started Check A1c      Relevant Orders   Lipid panel   Microalbumin / creatinine urine ratio   Comprehensive metabolic panel   Hemoglobin A1c     Other   Shoulder pain, right    R rot cuff strain - better S/p PT at [facility] Blue-Emu cream was recommended  2-3 times a day      Grief    Son died at 73      Dyslipidemia    On diet Use fish oil      Relevant Orders   Microalbumin / creatinine urine ratio   Comprehensive metabolic panel       No orders of the defined types were placed  in this encounter.     Follow-up: Return in about 3 months (around 02/15/2023) for a follow-up visit.  Monica Kehr, MD

## 2022-11-16 NOTE — Assessment & Plan Note (Signed)
Repaginate - re-started Check A1c

## 2022-11-16 NOTE — Assessment & Plan Note (Signed)
Son died at 38

## 2022-11-16 NOTE — Assessment & Plan Note (Signed)
Cont on Maxzide 

## 2022-11-16 NOTE — Assessment & Plan Note (Signed)
On diet Use fish oil

## 2022-11-17 LAB — COMPREHENSIVE METABOLIC PANEL
ALT: 29 U/L (ref 0–35)
AST: 22 U/L (ref 0–37)
Albumin: 4.4 g/dL (ref 3.5–5.2)
Alkaline Phosphatase: 102 U/L (ref 39–117)
BUN: 13 mg/dL (ref 6–23)
CO2: 31 mEq/L (ref 19–32)
Calcium: 10 mg/dL (ref 8.4–10.5)
Chloride: 103 mEq/L (ref 96–112)
Creatinine, Ser: 1.05 mg/dL (ref 0.40–1.20)
GFR: 51.8 mL/min — ABNORMAL LOW (ref >60.00)
Glucose, Bld: 142 mg/dL — ABNORMAL HIGH (ref 70–99)
Potassium: 3.6 mEq/L (ref 3.5–5.1)
Sodium: 139 mEq/L (ref 135–145)
Total Bilirubin: 0.5 mg/dL (ref 0.2–1.2)
Total Protein: 7.7 g/dL (ref 6.0–8.3)

## 2022-11-17 LAB — LDL CHOLESTEROL, DIRECT: Direct LDL: 124 mg/dL

## 2022-11-17 LAB — LIPID PANEL
Cholesterol: 222 mg/dL — ABNORMAL HIGH (ref 0–200)
HDL: 40.4 mg/dL (ref >39.00)
NonHDL: 181.48
Total CHOL/HDL Ratio: 5
Triglycerides: 273 mg/dL — ABNORMAL HIGH (ref 0.0–149.0)
VLDL: 54.6 mg/dL — ABNORMAL HIGH (ref 0.0–40.0)

## 2022-11-17 LAB — MICROALBUMIN / CREATININE URINE RATIO
Creatinine,U: 86.9 mg/dL
Microalb Creat Ratio: 0.8 mg/g (ref 0.0–30.0)
Microalb, Ur: <0.7 mg/dL (ref 0.0–1.9)

## 2022-12-09 ENCOUNTER — Other Ambulatory Visit: Payer: Self-pay | Admitting: Internal Medicine

## 2022-12-09 DIAGNOSIS — M159 Polyosteoarthritis, unspecified: Secondary | ICD-10-CM

## 2022-12-09 DIAGNOSIS — M25472 Effusion, left ankle: Secondary | ICD-10-CM

## 2022-12-15 ENCOUNTER — Ambulatory Visit: Payer: Medicare HMO

## 2022-12-15 ENCOUNTER — Ambulatory Visit: Payer: Medicare HMO | Admitting: Psychology

## 2022-12-15 ENCOUNTER — Encounter: Payer: Self-pay | Admitting: Psychology

## 2022-12-15 DIAGNOSIS — R41840 Attention and concentration deficit: Secondary | ICD-10-CM | POA: Diagnosis not present

## 2022-12-15 DIAGNOSIS — R4189 Other symptoms and signs involving cognitive functions and awareness: Secondary | ICD-10-CM

## 2022-12-15 NOTE — Progress Notes (Signed)
   Psychometrician Note   Cognitive testing was administered to Laasia J Lohr by Wallace Keller, B.S. (psychometrist) under the supervision of Dr. Newman Nickels, Ph.D., licensed psychologist on 12/15/2022. Ms. Kilker did not appear overtly distressed by the testing session per behavioral observation or responses across self-report questionnaires. Rest breaks were offered.    The battery of tests administered was selected by Dr. Newman Nickels, Ph.D. with consideration to Ms. Bevel's current level of functioning, the nature of her symptoms, emotional and behavioral responses during interview, level of literacy, observed level of motivation/effort, and the nature of the referral question. This battery was communicated to the psychometrist. Communication between Dr. Newman Nickels, Ph.D. and the psychometrist was ongoing throughout the evaluation and Dr. Newman Nickels, Ph.D. was immediately accessible at all times. Dr. Newman Nickels, Ph.D. provided supervision to the psychometrist on the date of this service to the extent necessary to assure the quality of all services provided.    Nicha J Mote will return within approximately 1-2 weeks for an interactive feedback session with Dr. Milbert Coulter at which time her test performances, clinical impressions, and treatment recommendations will be reviewed in detail. Ms. Bayard understands she can contact our office should she require our assistance before this time.  A total of 130 minutes of billable time were spent face-to-face with Ms. Shore by the psychometrist. This includes both test administration and scoring time. Billing for these services is reflected in the clinical report generated by Dr. Newman Nickels, Ph.D.  This note reflects time spent with the psychometrician and does not include test scores or any clinical interpretations made by Dr. Milbert Coulter. The full report will follow in a separate note.

## 2022-12-15 NOTE — Progress Notes (Unsigned)
NEUROPSYCHOLOGICAL EVALUATION [place]. [facility] [place] Department of Neurology  Date of Evaluation: Dec 15, 2022  Reason for Referral:   Monica Horn is a 77 y.o. right-handed African-American female referred by Marlowe Kays, PA-C, to characterize her current cognitive functioning and assist with diagnostic clarity and treatment planning in the context of subjective cognitive decline.   Assessment and Plan:   Clinical Impression(s): Monica Horn pattern of performance is suggestive of relative weaknesses surrounding visuospatial abilities and receptive language. However, performances across both these domains were weakened by noted distractibility during these tasks (see below). Further performance variability was exhibited across processing speed and executive functioning. Performances were appropriate relative to age-matched peers and premorbid intellectual estimations across attention/concentration, safety/judgment, expressive language, and all aspects of learning and memory. I do not feel that current performances arise to the level when a formal neurocognitive disorder diagnosis is warranted.   Regarding etiology, several areas of weakness described above may simply be related to Monica Horn's distractibility. During testing, sustained attention was variable. There were several occasions where Monica Horn seemingly distracted herself or was not felt to be fully attending to test stimuli. There were also instances where she appeared impulsive and attempted to start tasks before instructions were completed. This was particularly noteworthy across a visuomotor sequencing task (TMT A, see 3 errors), a task assessing receptive language, and when asked to draw a complex figure. Specific to the latter, performance was notable for a very rushed performance despite instructions emphasizing a slowed, exact approach, likely resulting in her fully omitting several fairly obvious internal  aspects of this drawing entirely. This highlights concerns surrounding impulsivity and intermittent poor attention to detail during the evaluation. During interview, Monica Horn was tangential in conversation and described herself as being perhaps overly active with too many responsibilities that can feel overwhelming at times. Her personal observation of her day-to-day life could certainly explain her primary concern surrounding her forgetting to pay utility bills one month in the recent past. Based upon intact memory testing, this would seem the far more likely explanation as current testing is certainly not suggestive of a neurodegenerative illness such as Alzheimer's disease.  She does not display any behavioral features concerning for Lewy body disease, another more rare parkinsonian condition, or frontotemporal lobar degeneration. Recent neuroimaging revealed very minimal microvascular ischemic changes, also making a primary vascular etiology unlikely. Continued medical monitoring will be important moving forward.   Recommendations: Should Monica Horn or her family have future concerns for progressive cognitive and/or functional decline, a repeat neuropsychological evaluation could be considered at that time. This should occur no sooner than 12 months from the current evaluation.  Monica Horn has already been prescribed a medication aimed to address memory loss and concerns surrounding Alzheimer's disease (i.e., memantine/Namenda). Based upon current memory testing, this medication would not appear necessary. She may wish to discuss the necessity of this medication with Monica Horn during their next appointment.  Performance across neurocognitive testing is not a strong predictor of an individual's safety operating a motor vehicle. Should her family wish to pursue a formalized driving evaluation, they could reach out to the following agencies: [employer] in [place]: [phone] [facility]: [phone] [facility]: [phone] [facility]: [phone] or [phone]  Should there be progression of current deficits over time, Monica Horn is unlikely to regain any independent living skills lost. Therefore, it is recommended that she remain as involved as possible in all aspects of  household chores, finances, and medication management, with supervision to ensure adequate performance. she will likely benefit from the establishment and maintenance of a routine in order to maximize her functional abilities over time.  Monica Horn is encouraged to attend to lifestyle factors for brain health (e.g., regular physical exercise, good nutrition habits and consideration of the MIND-DASH diet, regular participation in cognitively-stimulating activities, and general stress management techniques), which are likely to have benefits for both emotional adjustment and cognition. In fact, in addition to promoting good general health, regular exercise incorporating aerobic activities (e.g., brisk walking, jogging, cycling, etc.) has been demonstrated to be a very effective treatment for depression and stress, with similar efficacy rates to both antidepressant medication and psychotherapy. Optimal control of vascular risk factors (including safe cardiovascular exercise and adherence to dietary recommendations) is encouraged. Continued participation in activities which provide mental stimulation and social interaction is also recommended.   To address problems with processing speed, she may wish to consider:   -Ensuring that she is alerted when essential material or instructions are being presented   -Adjusting the speed at which new information is presented   -Allowing for more time in comprehending, processing, and responding in conversation   -Repeating and paraphrasing instructions or conversations aloud  To address problems with fluctuating attention and/or executive  dysfunction, she may wish to consider:   -Avoiding external distractions when needing to concentrate   -Limiting exposure to fast paced environments with multiple sensory demands   -Writing down complicated information and using checklists   -Attempting and completing one task at a time (i.e., no multi-tasking)   -Verbalizing aloud each step of a task to maintain focus   -Taking frequent breaks during the completion of steps/tasks to avoid fatigue   -Reducing the amount of information considered at one time   -Scheduling more difficult activities for a time of day where she is usually most alert  Review of Records:   Ms. Dearmas was seen by [facility] Marlowe Kays, PA-C) on 07/22/2022 for an evaluation of memory loss. At that appointment, she noted a recent history of forgetting to pay several utility bills, which was said to bring about concerns surrounding memory capabilities. She did report being extremely active, overwhelmed at times, and may become distracted due to this. She denied concerns surrounding the rapid forgetting of information. Trouble with ADLs was otherwise denied. Performance on a brief cognitive screening instrument (MOCA) was 19/30. Ultimately, Ms. Tygart was referred for a comprehensive neuropsychological evaluation to characterize her cognitive abilities and to assist with diagnostic clarity and treatment planning.  Brain MRI on 08/23/2022 revealed mild generalized age-related volume loss and minimal microvascular ischemic disease less than typical for age.  Past Medical History:  Diagnosis Date   Abnormal EKG 2014   NONSPECIFIC ST DEPRESSION + T ANORMALITY, WORKED UP 2014 DR Sharrell Ku AND RELEASED BY DR Ladona Ridgel   Acute sinusitis 07/01/2021   11/22, 2/23 Omnicef  Augmentin bid x 10 d   Acute tonsillitis 08/05/2008   Allergic rhinitis 11/11/2015   3//17 Zyrtec  Use Sinus rinse - given   Anemia, unspecified 10/27/2007   Ankle swelling 04/23/2019   Blood in stool  10/27/2007   Recurrent   Chronic   Dr Russella Dar  Colon is due this fall 2019   Blood transfusion without reported diagnosis    Carpal tunnel syndrome 10/27/2007   R>L  She had L wrist operated on      Cerumen impaction 12/01/2009   B wax  Chest pain, atypical 08/26/2012   Chronic knee pain after total replacement of right knee joint 04/21/2022   Closed undisplaced fracture of nasal bone 12/09/2016   Colon polyps    Cystitis 09/08/2009   Dizziness 04/04/2009   Dog bite 09/27/2022   Face 09/2022  S/p Rabies shots   Dyslipidemia 06/20/2007   Chronic   Statin intolerant  On diet  Use fish oil   Eruption cyst 04/05/2014   Fatty infiltration of liver    Finger contusion 11/30/2010   R 4th   Foot pain 06/13/2012   2020 L dorsal pain - OA/capsulitis  2021 L ankle pain since the Fall of 2020 - saw dr Charlsie Merles, was wearing a boot in may 2021  "Inflammatory tendinitis left posterior tib along with flatfoot deformity which is contributory towards the problem"  6/21 Treat edema of LEs  Medrol pac  Sports med ref if not better   Forehead contusion 11/19/2016   C/o hitting a cabinet after chocking on ice - fell; ?passed out at the client's house November 04 2016; had a hematoma on the central forehead. She had a HA x1d. She had a black eye later. No n/v.  Nose bones X ray IMPRESSION:  Depressed anterior nasal bone fractures bilaterally.     Mild swelling of the nasal septal soft tissues.        Electronically Signed    By: Delbert Phenix M.D.    On: 04/12/201   Ganglion cyst 11/11/2015   L wrist tender 1 cm ganglion 3/17  Dr Amanda Pea  ?L foot 2020   Gastric polyp 07/18/2018   GERD (gastroesophageal reflux disease)    Hematochezia 10/27/2007   Hemorrhage of gastrointestinal tract 10/27/2007   Hemorrhoids 10/27/2007   History of COVID-19 09/01/2019   History of revision of total replacement of left hip joint 11/17/2018   HTN (hypertension) 07/11/2007   Chronic - on Norvasc - d/c (swelling)  D/c Benazepril due  to cough 6/16 - restarted 8/16  D/c Losartan due to HA  9/20 - Cont on Maxzide   Hydradenitis 01/2009   Right S aureus   Ingrown right big toenail 09/27/2014   Near syncope 10/13/2012   2/14  Abn ECHO   OA (osteoarthritis) of hip 10/18/2018   Osteoarthritis of right knee 04/25/2022   Otitis externa 07/27/2010   2020  Corisporin      Rash and nonspecific skin eruption 04/05/2014   8/15 recurrent - R inner thigh  ?herpes - pt decline Rx   Shoulder pain, right 11/30/2010   R rot cuff strain, repeat 2023  Arnica gel or cream on bruises     S/p PT at [facility]  Blue-Emu cream was recommended  2-3 times a day   Sinus infection 10/02/2018   Statin intolerance 07/29/2020   Tennis elbow 01/13/2021   5/22 L  Ice, Voltaren gel, massage   Tubulovillous adenoma polyp of colon 03/2007   w/ focal high grade dysplasia, no carcinoma   Type II diabetes mellitus 06/12/2007   Diet controlled  CT ca scoring info given 12/19, advised     Not taking Repaginate - re-start  Risks associated with treatment noncompliance were discussed. Compliance was encouraged.  Re-start Rybelsus - use Assistance program, if covered - too $$$   URI, acute 06/11/2011   10/12, 10/13, 4/15, 11/15, 3/16, 6/16, 2/20, 1/21   UTI (urinary tract infection) 01/04/2014    Past Surgical History:  Procedure Laterality Date   CARPAL TUNNEL RELEASE  Lt  wrist, rt wrist 2014   COLONOSCOPY     POLYPECTOMY     TOTAL HIP ARTHROPLASTY Left 10/18/2018   Procedure: TOTAL HIP ARTHROPLASTY ANTERIOR APPROACH;  Surgeon: Ollen Gross, MD;  Location: [facility];  Service: Orthopedics;  Laterality: Left;    TUBAL LIGATION      Current Outpatient Medications:    celecoxib (CELEBREX) 200 MG capsule, TAKE 1 CAPSULE EVERY DAY AS NEEDED, Disp: 90 capsule, Rfl: 3   cetirizine (ZYRTEC) 10 MG tablet, Take 1 tablet (10 mg total) by mouth daily as needed., Disp: 90 tablet, Rfl: 3   cholecalciferol (VITAMIN D3) 10 MCG (400 UNIT) TABS tablet, Take  1,000 Units by mouth daily., Disp: , Rfl:    fluticasone (FLONASE) 50 MCG/ACT nasal spray, INSTILL 2 SPRAYS INTO BOTH NOSTRILS ONCE DAILY, Disp: 16 g, Rfl: 5   furosemide (LASIX) 20 MG tablet, TAKE 1 TO 2 TABLETS EVERY DAY, Disp: 180 tablet, Rfl: 3   memantine (NAMENDA) 5 MG tablet, Take 1 tablet (5 mg total) by mouth 2 (two) times daily., Disp: 60 tablet, Rfl: 11   Multiple Vitamin (MULTIVITAMIN) tablet, Take 1 tablet by mouth daily., Disp: , Rfl:    pantoprazole (PROTONIX) 40 MG tablet, TAKE 1 TABLET EVERY DAY, Disp: 90 tablet, Rfl: 3   potassium chloride SA (KLOR-CON M) 20 MEQ tablet, Take 1 tablet (20 mEq total) by mouth daily., Disp: 90 tablet, Rfl: 3   repaglinide (PRANDIN) 1 MG tablet, Take 1 tablet (1 mg total) by mouth 3 (three) times daily before meals., Disp: 270 tablet, Rfl: 3   triamterene-hydrochlorothiazide (MAXZIDE-25) 37.5-25 MG tablet, TAKE 1 TABLET EVERY DAY, Disp: 90 tablet, Rfl: 3   trolamine salicylate (ASPERCREME) 10 % cream, Apply 1 application topically 2 (two) times daily as needed for muscle pain., Disp: , Rfl:   Clinical Interview:   The following information was obtained during a clinical interview with Ms. Takayama prior to cognitive testing.  Cognitive Symptoms: Decreased short-term memory: Endorsed. Ms. Ellzey described a situation this past November where she was informed that she had not paid several utility bills and was being requested to pay for multiple months. This was said to be quite surprising to her, leading to potential concerns surrounding memory functioning. This was reported to have not happened previously or in the months since. No other direct memory difficulties were described or endorsed. A specific timeline for gradual memory decline was also not suggested. However, she did report her perception of improvement since being prescribed memantine and also taking a lion's mane supplement.  Decreased long-term memory: Denied. Decreased  attention/concentration: Denied. Reduced processing speed: Denied. Difficulties with executive functions: Denied. Difficulties with emotion regulation: Denied. Difficulties with receptive language: Denied. Difficulties with word finding: Denied. Decreased visuoperceptual ability: Denied.  Difficulties completing ADLs: Denied outside of her isolated event described above surrounding bill paying. She did note that she has started proceedings to have her daughter take over as financial POA.   Additional Medical History: History of traumatic brain injury/concussion: Denied. History of stroke: Denied. History of seizure activity: Denied. History of known exposure to toxins: Denied. Symptoms of chronic pain: Endorsed. She reported ongoing age-related arthritic pain which was not said to be debilitating. She also described more acute shoulder pain stemming from falling after being attacked by a dog this past January.  Experience of frequent headaches/migraines: Denied. Frequent instances of dizziness/vertigo: Denied.  Sensory changes: She wears glasses with benefit. Other sensory changes/difficulties (e.g., hearing, taste, smell) were denied.  Balance/coordination difficulties: Denied. She also denied any recent falls. Other motor difficulties: Denied.  Sleep History: Estimated hours obtained each night: 7-8 hours.  Difficulties falling asleep: Denied. Difficulties staying asleep: Denied. Feels rested and refreshed upon awakening: Endorsed.  History of snoring: Denied. History of waking up gasping for air: Denied. Witnessed breath cessation while asleep: Denied.  History of vivid dreaming: Denied. Excessive movement while asleep: Denied. Instances of acting out her dreams: Denied.  Psychiatric/Behavioral Health History: Depression: She described her current mood as being positive overall. However, she did endorse several recent stressors which have impacted her daily life. This  includes the passing of her son, being attacked by a dog and bitten in the forehead/face, and the passing of her husband's younger brother all within the past few months. She denied a history of prior mental health concerns or formal diagnoses. Current or remote suicidal ideation, intent, or plan was denied.  Anxiety: Denied. Mania: Denied. Trauma History: Denied. Visual/auditory hallucinations: Denied. Delusional thoughts: Denied.  Tobacco: Denied. Alcohol: She denied current alcohol consumption as well as a history of problematic alcohol abuse or dependence.  Recreational drugs: Denied.  Family History: Problem Relation Age of Onset   Stroke Mother    Hypertension Mother    Lung cancer Mother    Cancer Father        ? liver   Alcohol abuse Father    Hypertension Other    Stroke Other    Colon cancer Neg Hx    Colon polyps Neg Hx    Rectal cancer Neg Hx    Stomach cancer Neg Hx    This information was confirmed by Ms. Cunnington.  Academic/Vocational History: Highest level of educational attainment: 13 years. She graduated from high school and estimated completing an additional year of college. She described herself as a good (A/B) student in academic settings. No relative weaknesses were identified.  History of developmental delay: Denied. History of grade repetition: Denied. Enrollment in special education courses: Denied. History of LD/ADHD: Denied.  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   Evaluation Results:   Behavioral Observations: Ms. Kolodziej was unaccompanied, arrived to her appointment on time, and was appropriately dressed and groomed. She appeared alert and oriented. Observed gait and station were within normal  limits. Gross motor functioning appeared intact upon informal observation and no abnormal movements (e.g., tremors) were noted. Her affect was generally relaxed and positive. Spontaneous speech was fluent and word finding difficulties were not observed during the clinical interview. Thought processes were tangential at times but overall coherent, organized, and normal in content. Insight into her cognitive difficulties appeared adequate.   During testing, sustained attention was variable. There were several occasions where Ms. Cong seemingly distracted herself or was not felt to be fully attending to test stimuli. This was particularly noteworthy across a visuomotor sequencing task (TMT A, see 3 errors), a task assessing receptive language, and when asked to draw a complex figure. Performance across the later drawing task was also notable for a very rushed performance despite instructions emphasizing a slowed, exact approach. Task engagement was adequate and she persisted when challenged. Overall, Ms. Mose was cooperative with the clinical interview and subsequent testing procedures.   Adequacy of Effort: The validity of neuropsychological testing is limited  by the extent to which the individual being tested may be assumed to have exerted adequate effort during testing. Ms. Chargois expressed her intention to perform to the best of her abilities and exhibited adequate task engagement and persistence. Scores across stand-alone and embedded performance validity measures were within expectation. As such, the results of the current evaluation are believed to be a valid representation of Ms. Minkler's current cognitive functioning.  Test Results: Ms. Zollinger was fully oriented at the time of the current evaluation.  Intellectual abilities based upon educational and vocational attainment were estimated to be in the average range. Premorbid abilities were estimated to be within the below average range based upon a  single-word reading test.   Processing speed was variable, ranging from the well below average to average normative ranges. Basic attention was average to above average. More complex attention (e.g., working memory) was average. Executive functioning was variable, ranging from the exceptionally low to above average normative ranges. She performed in the average range across a task assessing safety and judgment.  Assessed receptive language abilities were well below average. However, as stated above, Ms. Lasater was easily distracted during this task and difficulties seemed more due to inattention rather than prominent comprehension difficulties. In support of this theory, Ms. Karrer did not exhibit any difficulties comprehending task instructions and answered all questions asked of her appropriately during interview, suggesting that this may represent more of a normative weakness than a clinical deficit. Assessed expressive language (e.g., verbal fluency and confrontation naming) was below average to average.     Assessed visuospatial/visuoconstructional abilities were exceptionally low to below average outside of her drawing of a clock (which was appropriate). Inattention also appeared to impact these performances, especially her copy of a complex figure. The majority of points were lost due to her omission of fairly obvious internal aspects, suggesting a rushed performance and poor attention to detail.    Learning (i.e., encoding) of novel verbal information was average. Spontaneous delayed recall (i.e., retrieval) of previously learned information was below average to average. Performance across recognition tasks was below average to average, suggesting evidence for information consolidation.   Results of emotional screening instruments suggested that recent symptoms of generalized anxiety were in the minimal range, while symptoms of depression were within normal limits. A screening instrument assessing  recent sleep quality suggested the presence of minimal sleep dysfunction.  Tables of Scores:   Note: This summary of test scores accompanies the interpretive report and should not be considered in isolation without reference to the appropriate sections in the text. Descriptors are based on appropriate normative data and may be adjusted based on clinical judgment. Terms such as "Within Normal Limits" and "Outside Normal Limits" are used when a more specific description of the test score cannot be determined.       Percentile - Normative Descriptor > 98 - Exceptionally High 91-97 - Well Above Average 75-90 - Above Average 25-74 - Average 9-24 - Below Average 2-8 - Well Below Average < 2 - Exceptionally Low       Orientation:      Raw Score Percentile   NAB Orientation, Form 1 29/29 --- ---       Cognitive Screening:      Raw Score Percentile   SLUMS: 25/30 --- ---       RBANS, Form A: Standard Score/ Scaled Score Percentile   Total Score 83 13 Below Average  Immediate Memory 100 50 Average    List Learning 9  37 Average    Story Memory 11 63 Average  Visuospatial/Constructional 64 1 Exceptionally Low    Figure Copy 4 2 Well Below Average    Line Orientation 8/20 <2 Exceptionally Low  Language 92 30 Average    Picture Naming 10/10 51-75 Average    Semantic Fluency 7 16 Below Average  Attention 85 16 Below Average    Digit Span 11 63 Average    Coding 4 2 Well Below Average  Delayed Memory 95 37 Average    List Recall 5/10 51-75 Average    List Recognition 20/20 51-75 Average    Story Recall 8 25 Average    Story Recognition 9/12 16-26 Below Average to Average    Figure Recall 7 16 Below Average    Figure Recognition 6/8 30-52 Average        Intellectual Functioning:      Standard Score Percentile   Test of Premorbid Functioning: 83 13 Below Average       Attention/Executive Function:     Trail Making Test (TMT): Raw Score (T Score) Percentile     Part A 68 secs.,  3  errors (39) 14 Below Average    Part B 108 secs.,  0 errors (53) 62 Average         Scaled Score Percentile   WAIS-IV Digit Span: 10 50 Average    Forward 13 84 Above Average    Backward 8 25 Average    Sequencing 9 37 Average        Scaled Score Percentile   WAIS-IV Similarities: 7 16 Below Average       D-KEFS Color-Word Interference Test: Raw Score (Scaled Score) Percentile     Color Naming 34 secs. (10) 50 Average    Word Reading 23 secs. (11) 63 Average    Inhibition 106 secs. (5) 5 Well Below Average      Total Errors 11 errors (2) <1 Exceptionally Low    Inhibition/Switching 107 secs. (6) 9 Below Average      Total Errors 5 errors (8) 25 Average       D-KEFS Verbal Fluency Test: Raw Score (Scaled Score) Percentile     Letter Total Correct 25 (7) 16 Below Average    Category Total Correct 26 (7) 16 Below Average    Category Switching Total Correct 14 (12) 75 Above Average    Category Switching Accuracy 13 (12) 75 Above Average      Total Set Loss Errors 0 (13) 84 Above Average      Total Repetition Errors 2 (11) 63 Average       NAB Executive Functions Module, Form 1: T Score Percentile     Judgment 53 62 Average       Language:     Verbal Fluency Test: Raw Score (T Score) Percentile     Phonemic Fluency (FAS) 25 (43) 25 Average    Animal Fluency 12 (47) 38 Average        NAB Language Module, Form 1: T Score Percentile     Auditory Comprehension 34 5 Well Below Average    Naming 27/31 (38) 12 Below Average       Visuospatial/Visuoconstruction:      Raw Score Percentile   Clock Drawing: 10/10 --- Within Normal Limits        Scaled Score Percentile   WAIS-IV Block Design: 6 9 Below Average       Mood and Personality:      Raw Score Percentile  Geriatric Depression Scale: 5 --- Within Normal Limits  Geriatric Anxiety Scale: 5 --- Minimal    Somatic 3 --- Minimal    Cognitive 2 --- Minimal    Affective 0 --- Minimal       Additional Questionnaires:       Raw Score Percentile   PROMIS Sleep Disturbance Questionnaire: 17 --- None to Slight   Informed Consent and Coding/Compliance:   The current evaluation represents a clinical evaluation for the purposes previously outlined by the referral source and is in no way reflective of a forensic evaluation.   Ms. Valvo was provided with a verbal description of the nature and purpose of the present neuropsychological evaluation. Also reviewed were the foreseeable risks and/or discomforts and benefits of the procedure, limits of confidentiality, and mandatory reporting requirements of this provider. The patient was given the opportunity to ask questions and receive answers about the evaluation. Oral consent to participate was provided by the patient.   This evaluation was conducted by Newman Nickels, Ph.D., ABPP-CN, board certified clinical neuropsychologist. Ms. Gul completed a clinical interview with Dr. Milbert Coulter, billed as one unit [phone], and 130 minutes of cognitive testing and scoring, billed as one unit [phone] and three additional units [id number]. Psychometrist Wallace Keller, B.S., assisted Dr. Milbert Coulter with test administration and scoring procedures. As a separate and discrete service, one unit [id number] and two units [phone] were billed for Dr. Tammy Sours time spent in interpretation and report writing.

## 2022-12-16 DIAGNOSIS — Z124 Encounter for screening for malignant neoplasm of cervix: Secondary | ICD-10-CM | POA: Diagnosis not present

## 2022-12-16 DIAGNOSIS — Z6832 Body mass index (BMI) 32.0-32.9, adult: Secondary | ICD-10-CM | POA: Diagnosis not present

## 2022-12-16 DIAGNOSIS — R87610 Atypical squamous cells of undetermined significance on cytologic smear of cervix (ASC-US): Secondary | ICD-10-CM | POA: Diagnosis not present

## 2023-01-13 ENCOUNTER — Ambulatory Visit: Payer: Medicare HMO | Admitting: Psychology

## 2023-01-13 DIAGNOSIS — R41840 Attention and concentration deficit: Secondary | ICD-10-CM

## 2023-01-13 NOTE — Progress Notes (Signed)
   Neuropsychology Feedback Session Monica Horn. [facility] Department of Neurology  Reason for Referral:   Monica Horn is a 77 y.o. right-handed African-American female referred by Marlowe Kays, PA-C, to characterize her current cognitive functioning and assist with diagnostic clarity and treatment planning in the context of subjective cognitive decline.   Feedback:   Monica Horn completed a comprehensive neuropsychological evaluation on 12/15/2022. Please refer to that encounter for the full report and recommendations. Briefly, results suggested relative weaknesses surrounding visuospatial abilities and receptive language. However, performances across both these domains were weakened by noted distractibility during these tasks.  During testing, sustained attention was variable. There were several occasions where Monica Horn seemingly distracted herself or was not felt to be fully attending to test stimuli. There were also instances where she appeared impulsive and attempted to start tasks before instructions were completed. This was particularly noteworthy across a visuomotor sequencing task (TMT A, see 3 errors), a task assessing receptive language, and when asked to draw a complex figure. Specific to the latter, performance was notable for a very rushed performance despite instructions emphasizing a slowed, exact approach, likely resulting in her fully omitting several fairly obvious internal aspects of this drawing entirely. This highlights concerns surrounding impulsivity and intermittent poor attention to detail during the evaluation. During interview, Monica Horn was tangential in conversation and described herself as being perhaps overly active with too many responsibilities that can feel overwhelming at times. Her personal observation of her day-to-day life could certainly explain her primary concern surrounding her forgetting to pay utility bills one month in the recent past. Based  upon intact memory testing, this would seem the far more likely explanation as current testing is certainly not suggestive of a neurodegenerative illness such as Alzheimer's disease.   Monica Horn was unaccompanied during the current feedback session. Content of the current session focused on the results of her neuropsychological evaluation. Monica Horn was given the opportunity to ask questions and her questions were answered. She was encouraged to reach out should additional questions arise. A copy of her report was provided at the conclusion of the visit.      One unit [phone] was billed for Dr. Tammy Sours time spent preparing for, conducting, and documenting the current feedback session with Monica Horn.

## 2023-01-22 LAB — HEMOGLOBIN A1C: Hemoglobin A1C: 6.9

## 2023-01-22 LAB — GLUCOSE, POCT (MANUAL RESULT ENTRY): Glucose Fasting, POC: 152 mg/dL — AB (ref 70–99)

## 2023-01-22 NOTE — Progress Notes (Signed)
Pt may need PCP. Pt receiving food resources from [employer] at this event.

## 2023-02-15 ENCOUNTER — Encounter: Payer: Self-pay | Admitting: Internal Medicine

## 2023-02-15 ENCOUNTER — Ambulatory Visit (INDEPENDENT_AMBULATORY_CARE_PROVIDER_SITE_OTHER): Payer: Medicare HMO | Admitting: Internal Medicine

## 2023-02-15 VITALS — BP 110/70 | HR 72 | Temp 98.6°F | Ht 61.0 in | Wt 173.0 lb

## 2023-02-15 DIAGNOSIS — I10 Essential (primary) hypertension: Secondary | ICD-10-CM | POA: Diagnosis not present

## 2023-02-15 DIAGNOSIS — R413 Other amnesia: Secondary | ICD-10-CM

## 2023-02-15 DIAGNOSIS — E119 Type 2 diabetes mellitus without complications: Secondary | ICD-10-CM

## 2023-02-15 DIAGNOSIS — M25511 Pain in right shoulder: Secondary | ICD-10-CM

## 2023-02-15 DIAGNOSIS — M1711 Unilateral primary osteoarthritis, right knee: Secondary | ICD-10-CM

## 2023-02-15 DIAGNOSIS — G8929 Other chronic pain: Secondary | ICD-10-CM

## 2023-02-15 DIAGNOSIS — Z7984 Long term (current) use of oral hypoglycemic drugs: Secondary | ICD-10-CM | POA: Diagnosis not present

## 2023-02-15 DIAGNOSIS — K219 Gastro-esophageal reflux disease without esophagitis: Secondary | ICD-10-CM | POA: Diagnosis not present

## 2023-02-15 NOTE — Assessment & Plan Note (Signed)
Repaginate - re-started Check A1c 

## 2023-02-15 NOTE — Assessment & Plan Note (Signed)
F/u w/Neurology - Camila Li, PA On Namenda Better

## 2023-02-15 NOTE — Assessment & Plan Note (Signed)
S/p PT at [facility]. Better Blue-Emu cream was recommended  2-3 times a day

## 2023-02-15 NOTE — Assessment & Plan Note (Signed)
Protonix.  ?

## 2023-02-15 NOTE — Progress Notes (Signed)
Subjective:  Patient ID: Monica Horn, female    DOB: 03/21/1946  Age: 77 y.o. MRN: [id number]  CC: Follow-up (3 mnth f/u)   HPI Briahnna J Suastegui presents for OA, DM, memory problems  Outpatient Medications Prior to Visit  Medication Sig Dispense Refill   celecoxib (CELEBREX) 200 MG capsule TAKE 1 CAPSULE EVERY DAY AS NEEDED 90 capsule 3   cetirizine (ZYRTEC) 10 MG tablet Take 1 tablet (10 mg total) by mouth daily as needed. 90 tablet 3   cholecalciferol (VITAMIN D3) 10 MCG (400 UNIT) TABS tablet Take 1,000 Units by mouth daily.     fluticasone (FLONASE) 50 MCG/ACT nasal spray INSTILL 2 SPRAYS INTO BOTH NOSTRILS ONCE DAILY 16 g 5   furosemide (LASIX) 20 MG tablet TAKE 1 TO 2 TABLETS EVERY DAY 180 tablet 3   memantine (NAMENDA) 5 MG tablet Take 1 tablet (5 mg total) by mouth 2 (two) times daily. 60 tablet 11   Multiple Vitamin (MULTIVITAMIN) tablet Take 1 tablet by mouth daily.     pantoprazole (PROTONIX) 40 MG tablet TAKE 1 TABLET EVERY DAY 90 tablet 3   potassium chloride SA (KLOR-CON M) 20 MEQ tablet Take 1 tablet (20 mEq total) by mouth daily. 90 tablet 3   repaglinide (PRANDIN) 1 MG tablet Take 1 tablet (1 mg total) by mouth 3 (three) times daily before meals. 270 tablet 3   triamterene-hydrochlorothiazide (MAXZIDE-25) 37.5-25 MG tablet TAKE 1 TABLET EVERY DAY 90 tablet 3   trolamine salicylate (ASPERCREME) 10 % cream Apply 1 application topically 2 (two) times daily as needed for muscle pain.     No facility-administered medications prior to visit.    ROS: Review of Systems  Constitutional:  Negative for activity change, appetite change, chills, fatigue and unexpected weight change.  HENT:  Negative for congestion, mouth sores and sinus pressure.   Eyes:  Negative for visual disturbance.  Respiratory:  Negative for cough and chest tightness.   Gastrointestinal:  Negative for abdominal pain and nausea.  Genitourinary:  Negative for difficulty urinating, frequency and vaginal  pain.  Musculoskeletal:  Negative for back pain and gait problem.  Skin:  Negative for pallor and rash.  Neurological:  Negative for dizziness, tremors, weakness, numbness and headaches.  Psychiatric/Behavioral:  Positive for decreased concentration. Negative for confusion and sleep disturbance. The patient is not nervous/anxious.     Objective:  BP 110/70 (BP Location: Right Arm, Patient Position: Sitting, Cuff Size: Large)   Pulse 72   Temp 98.6 F (37 C) (Oral)   Ht 5\' 1"  (1.549 m)   Wt 173 lb (78.5 kg)   SpO2 96%   BMI 32.69 kg/m   BP Readings from Last 3 Encounters:  02/15/23 110/70  01/22/23 (!) 147/93  11/16/22 118/78    Wt Readings from Last 3 Encounters:  02/15/23 173 lb (78.5 kg)  01/22/23 170 lb (77.1 kg)  11/16/22 173 lb (78.5 kg)    Physical Exam Constitutional:      General: She is not in acute distress.    Appearance: She is well-developed.  HENT:     Head: Normocephalic.     Right Ear: External ear normal.     Left Ear: External ear normal.     Nose: Nose normal.  Eyes:     General:        Right eye: No discharge.        Left eye: No discharge.     Conjunctiva/sclera: Conjunctivae normal.  Pupils: Pupils are equal, round, and reactive to light.  Neck:     Thyroid: No thyromegaly.     Vascular: No JVD.     Trachea: No tracheal deviation.  Cardiovascular:     Rate and Rhythm: Normal rate and regular rhythm.     Heart sounds: Normal heart sounds.  Pulmonary:     Effort: No respiratory distress.     Breath sounds: No stridor. No wheezing.  Abdominal:     General: Bowel sounds are normal. There is no distension.     Palpations: Abdomen is soft. There is no mass.     Tenderness: There is no abdominal tenderness. There is no guarding or rebound.  Musculoskeletal:        General: No tenderness.     Cervical back: Normal range of motion and neck supple. No rigidity.  Lymphadenopathy:     Cervical: No cervical adenopathy.  Skin:    Findings:  No erythema or rash.  Neurological:     Cranial Nerves: No cranial nerve deficit.     Motor: No abnormal muscle tone.     Coordination: Coordination normal.     Deep Tendon Reflexes: Reflexes normal.  Psychiatric:        Behavior: Behavior normal.        Thought Content: Thought content normal.        Judgment: Judgment normal.     Lab Results  Component Value Date   WBC 7.4 01/06/2022   HGB 13.5 01/06/2022   HCT 43.8 01/06/2022   PLT 315.0 01/06/2022   GLUCOSE 142 (H) 11/16/2022   CHOL 222 (H) 11/16/2022   TRIG 273.0 (H) 11/16/2022   HDL 40.40 11/16/2022   LDLDIRECT 124.0 11/16/2022   LDLCALC 130 (H) 06/24/2017   ALT 29 11/16/2022   AST 22 11/16/2022   NA 139 11/16/2022   K 3.6 11/16/2022   CL 103 11/16/2022   CREATININE 1.05 11/16/2022   BUN 13 11/16/2022   CO2 31 11/16/2022   TSH 1.61 01/06/2022   INR 0.9 10/13/2018   HGBA1C 6.9 01/22/2023   MICROALBUR <0.7 11/16/2022    No results found.  Assessment & Plan:   Problem List Items Addressed This Visit     Type II diabetes mellitus    Repaginate - re-started Check A1c      HTN (hypertension) - Primary    Cont on Maxzide      Shoulder pain, right    S/p PT at [facility]. Better Blue-Emu cream was recommended  2-3 times a day      GERD (gastroesophageal reflux disease)    Protonix      Osteoarthritis of right knee    F/u w/Dr Aluisio      Memory problem    F/u w/Neurology - Camila Li, PA On Namenda Better         No orders of the defined types were placed in this encounter.     Follow-up: Return in about 3 months (around 05/18/2023) for a follow-up visit.  Sonda Primes, MD

## 2023-02-15 NOTE — Assessment & Plan Note (Signed)
F/u w/Dr Aluisio

## 2023-02-15 NOTE — Assessment & Plan Note (Signed)
Cont on Maxzide 

## 2023-02-21 ENCOUNTER — Ambulatory Visit: Payer: Medicare HMO | Admitting: Physician Assistant

## 2023-02-21 VITALS — BP 144/86 | HR 110 | Ht 61.0 in | Wt 173.6 lb

## 2023-02-21 DIAGNOSIS — R41840 Attention and concentration deficit: Secondary | ICD-10-CM

## 2023-02-21 NOTE — Progress Notes (Signed)
Assessment/Plan:   Attention or concentration deficit   Monica Horn is a very pleasant 77 y.o. RH femalewith a history ofhypertension, hyperlipidemia, microcytic anemia,  DM2, anxiety, seen today after Neuropsych evaluation. This yielded a diagnosis of Attention or concentration deficit. There is no evidence in her testing of neurodegenerative disease such as Alzheimer's disease. Patient was last seen on  08/23/22. MoCA in 07/22/22 was 19/30   Patient is on memantine 5 mg bid, however, in view of these findings and most recent imaging, there is no indication for antidementia medication at this time.      Follow up as needed Follow up anemia as per PCP Discontinue memantine  as this is not needed, no neurodegenerative disease is diagnosis  Follow ADD with Primary provider  Recommend good control of her cardiovascular risk factors Continue to control mood as per PCP    Subjective:      Any changes in memory since last visit? " No memory changes". She continues to be active in [place]. Patient has some difficulty remembering recent conversations and people names bot without significant changes .She likes doing wordfinding, does not like crosswords.  repeats oneself?  Endorsed Disoriented when walking into a room?  Patient denies    Leaving objects in unusual places?  May misplace things but not in unusual places   Wandering behavior?  denies   Any personality changes since last visit?  denies   Any worsening depression?:  denies   Hallucinations or paranoia?  denies   Seizures? denies    Any sleep changes?  Denies vivid dreams, REM behavior or sleepwalking   Sleep apnea?   denies   Any hygiene concerns? denies  Independent of bathing and dressing?  Endorsed  Does the patient needs help with medications? Patient is in charge   Who is in charge of the finances? daughter is in charge     Any changes in appetite?  denies     Patient have trouble swallowing? denies   Does the patient  cook? No, her husband is a Chef Any headaches?   Very seldom, reolves with tylenol.  Chronic back pain  denies   Ambulates with difficulty? Exercises 4 x a week.   Recent falls or head injuries? denies     Unilateral weakness, numbness or tingling? denies   Any tremors?  denies   Any anosmia?  Patient denies   Any incontinence of urine?  Endorsed  wears pads Any bowel dysfunction?   denies      Patient lives with husband    Does the patient drive? Yes, maybe sometimes going " where I was going, oh yes to [employer]"  Initial visit 07/2022  How long did patient have memory difficulties? 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 " I know that age has to do something to do with it".  Denies forgetting recent conversations, but occasionally she forgets names of people that she has not seen in a long time. Des not do crossword puzzles "but when I have time I like word finding" repeats oneself? Denies and husband agrees Disoriented when walking into a room?  Patient denies   Leaving objects in unusual places?  Patient denies   Ambulates  with difficulty?  She exercises 4 times a week, and has R knee arthritis and L foot arthritis which limits her mobility  Recent falls or  head injuries?  Patient denies   History of seizures?   Patient denies   Wandering behavior?  Patient denies   Patient drives?   Continues to drive, occasionally may wonder ? Where I am going, oh yeah, I remember!" Any history of depression?:  Patient denies   Hallucinations?  Patient denies   Paranoia?  Patient denies   Patient reports that sleeps well without vivid dreams, REM behavior or sleepwalking . Sometimes in the middle of the night she thinks  to make plans for [facility] which may keep her awake History of sleep apnea?  Patient denies   Any hygiene concerns?  Patient denies   Independent  of bathing and dressing?  Endorsed  Does the patient needs help with medications? Patient in charge   Who is in charge of the finances?  Patient is in charge and  thinking that her daughter should take over the bills. Any changes in appetite?  Patient denies "We eat everything is on the table"   Patient have trouble swallowing? Patient denies   Does the patient cook?  Patient cooks together with her husband, he is a Investment banker, operational Any kitchen accidents such as leaving the stove on? Patient denies   Any headaches?  Very seldom she has a headache controlled with Tylenol   Vision changes? Patient denies   Any focal numbness or tingling?  Patient denies   Chronic pain takes a shot in the knee on a regular basis due to arthritis   Unilateral weakness?  Patient denies   Any tremors?  Patient denies   Any history of anosmia?  Patient denies   Any incontinence of urine?  Patient denies   Any bowel dysfunction?   Patient denies   History of heavy alcohol intake?  Patient denies   History of heavy tobacco use?  Patient denies   Family history of dementia?  Patient denies  Patient lives with her husband   Neuropsychological evaluation 12/2022   Briefly, results suggested relative weaknesses surrounding visuospatial abilities and receptive language. However, performances across both these domains were weakened by noted distractibility during these tasks.  During testing, sustained attention was variable. There were several occasions where Ms. Attig seemingly distracted herself or was not felt to be fully attending to test stimuli. There were also instances where she appeared impulsive and attempted to start tasks before instructions were completed. This was particularly noteworthy across a visuomotor sequencing task (TMT A, see 3 errors), a task assessing receptive language, and when asked to draw a complex figure. Specific to the latter, performance was notable for a very rushed performance despite instructions  emphasizing a slowed, exact approach, likely resulting in her fully omitting several fairly obvious internal aspects of this drawing entirely. This highlights concerns surrounding impulsivity and intermittent poor attention to detail during the evaluation. During interview, Ms. Hackbart was tangential in conversation and described herself as being perhaps overly active with too many responsibilities that can feel overwhelming at times. Her personal observation of her day-to-day life could certainly explain her primary concern surrounding her forgetting to pay utility bills one month in the recent past. Based upon intact memory testing, this would seem the far more likely explanation as current testing is certainly not suggestive of a neurodegenerative illness such as Alzheimer's disease.      Recent labs September 2023 A1c 8.1, MCV 75.9 (chronic low MCV), TSH 1.61 PREVIOUS MEDICATIONS:   CURRENT MEDICATIONS:  Outpatient Encounter Medications as of 02/21/2023  Medication Sig   aspirin  81 MG chewable tablet Chew by mouth daily.   celecoxib (CELEBREX) 200 MG capsule TAKE 1 CAPSULE EVERY DAY AS NEEDED   cetirizine (ZYRTEC) 10 MG tablet Take 1 tablet (10 mg total) by mouth daily as needed.   cholecalciferol (VITAMIN D3) 10 MCG (400 UNIT) TABS tablet Take 1,000 Units by mouth daily.   fluticasone (FLONASE) 50 MCG/ACT nasal spray INSTILL 2 SPRAYS INTO BOTH NOSTRILS ONCE DAILY   memantine (NAMENDA) 5 MG tablet Take 1 tablet (5 mg total) by mouth 2 (two) times daily.   Multiple Vitamin (MULTIVITAMIN) tablet Take 1 tablet by mouth daily.   NON FORMULARY OTC LION MAIN  Once a day   pantoprazole (PROTONIX) 40 MG tablet TAKE 1 TABLET EVERY DAY   potassium chloride SA (KLOR-CON M) 20 MEQ tablet Take 1 tablet (20 mEq total) by mouth daily.   repaglinide (PRANDIN) 1 MG tablet Take 1 tablet (1 mg total) by mouth 3 (three) times daily before meals.   triamterene-hydrochlorothiazide (MAXZIDE-25) 37.5-25 MG tablet TAKE 1  TABLET EVERY DAY   trolamine salicylate (ASPERCREME) 10 % cream Apply 1 application topically 2 (two) times daily as needed for muscle pain.   furosemide (LASIX) 20 MG tablet TAKE 1 TO 2 TABLETS EVERY DAY (Patient not taking: Reported on 02/21/2023)   No facility-administered encounter medications on file as of 02/21/2023.       09/09/2017   10:40 AM  MMSE - Mini Mental State Exam  Orientation to time 5  Orientation to Place 5  Registration 3  Attention/ Calculation 5  Recall 2  Language- name 2 objects 2  Language- repeat 1  Language- follow 3 step command 3  Language- read & follow direction 1  Write a sentence 1  Copy design 1  Total score 29      07/27/2022    9:52 AM 07/22/2022    1:00 PM  Montreal Cognitive Assessment   Visuospatial/ Executive (0/5) 1 1  Naming (0/3) 2 2  Attention: Read list of digits (0/2) 2 2  Attention: Read list of letters (0/1) 1 1  Attention: Serial 7 subtraction starting at 100 (0/3) 1 0  Language: Repeat phrase (0/2) 1 1  Language : Fluency (0/1) 1 1  Abstraction (0/2) 2 2  Delayed Recall (0/5) 3 3  Orientation (0/6) 6 6  Total 20 19  Adjusted Score (based on education) 20 19    Objective:     PHYSICAL EXAMINATION:    VITALS:   Vitals:   02/21/23 1433  BP: (!) 144/86  Pulse: (!) 110  SpO2: 100%  Weight: 173 lb 9.6 oz (78.7 kg)  Height: 5\' 1"  (1.549 m)    GEN:  The patient appears stated age and is in NAD. HEENT:  Normocephalic, atraumatic.   Neurological examination:  General: NAD, well-groomed, appears stated age. Orientation: The patient is alert. Oriented to person, place and date Cranial nerves: There is good facial symmetry.The speech is fluent and clear. No aphasia or dysarthria. Fund of knowledge is appropriate. Recent memory impaired, remote memory normal   Attention and concentration are reduced.  Able to name objects and repeat phrases.  Hearing is intact to conversational tone.   Sensation: Sensation is intact to  light touch throughout Motor: Strength is at least antigravity x4. DTR's 2/4 in UE/LE     Movement examination: Tone: There is normal tone in the UE/LE Abnormal movements:  no tremor.  No myoclonus.  No asterixis.   Coordination:  There is no  decremation with RAM's. Normal finger to nose  Gait and Station: The patient has no difficulty arising out of a deep-seated chair without the use of the hands. The patient's stride length is good.  Gait is cautious and narrow.    Thank you for allowing [place] the opportunity to participate in the care of this nice patient. Please do not hesitate to contact us for any questions or concerns.   Total time spent on today's visit was 20 minutes dedicated to this patient today, preparing to see patient, examining the patient, ordering tests and/or medications and counseling the patient, documenting clinical information in the EHR or other health record, independently interpreting results and communicating results to the patient/family, discussing treatment and goals, answering patient's questions and coordinating care.  Cc:  Plotnikov, Georgina Quint, MD  Marlowe Kays 02/21/2023 6:33 PM

## 2023-02-21 NOTE — Patient Instructions (Addendum)
It was a pleasure to see you today at our office.   Recommendations:   Discontinue Memantine,  it is not  needed since there is no dementia  Follow up as needed  Follow attention issues with primary doctor  Continue B12 suplements  Whom to call:  Memory  decline, memory medications: Call our office [phone]   For psychiatric meds, mood meds: Please have your primary care physician manage these medications.     If you have any severe symptoms of a stroke, or other severe issues such as confusion,severe chills or fever, etc call 911 or go to the ER as you may need to be evaluated further      RECOMMENDATIONS FOR ALL PATIENTS WITH MEMORY PROBLEMS: 1. Continue to exercise (Recommend 30 minutes of walking everyday, or 3 hours every week) 2. Increase social interactions - continue going to [place] and enjoy social gatherings with friends and family 3. Eat healthy, avoid fried foods and eat more fruits and vegetables 4. Maintain adequate blood pressure, blood sugar, and blood cholesterol level. Reducing the risk of stroke and cardiovascular disease also helps promoting better memory. 5. Avoid stressful situations. Live a simple life and avoid aggravations. Organize your time and prepare for the next day in anticipation. 6. Sleep well, avoid any interruptions of sleep and avoid any distractions in the bedroom that may interfere with adequate sleep quality 7. Avoid sugar, avoid sweets as there is a strong link between excessive sugar intake, diabetes, and cognitive impairment We discussed the Mediterranean diet, which has been shown to help patients reduce the risk of progressive memory disorders and reduces cardiovascular risk. This includes eating fish, eat fruits and green leafy vegetables, nuts like almonds and hazelnuts, walnuts, and also use olive oil. Avoid fast foods and fried foods as much as possible. Avoid sweets and sugar as sugar use has been linked to worsening of memory  function.  There is always a concern of gradual progression of memory problems. If this is the case, then we may need to adjust level of care according to patient needs. Support, both to the patient and caregiver, should then be put into place.        FALL PRECAUTIONS: Be cautious when walking. Scan the area for obstacles that may increase the risk of trips and falls. When getting up in the mornings, sit up at the edge of the bed for a few minutes before getting out of bed. Consider elevating the bed at the head end to avoid drop of blood pressure when getting up. Walk always in a well-lit room (use night lights in the walls). Avoid area rugs or power cords from appliances in the middle of the walkways. Use a walker or a cane if necessary and consider physical therapy for balance exercise. Get your eyesight checked regularly.  FINANCIAL OVERSIGHT: Supervision, especially oversight when making financial decisions or transactions is also recommended.  HOME SAFETY: Consider the safety of the kitchen when operating appliances like stoves, microwave oven, and blender. Consider having supervision and share cooking responsibilities until no longer able to participate in those. Accidents with firearms and other hazards in the house should be identified and addressed as well.   ABILITY TO BE LEFT ALONE: If patient is unable to contact 911 operator, consider using LifeLine, or when the need is there, arrange for someone to stay with patients. Smoking is a fire hazard, consider supervision or cessation. Risk of wandering should be assessed by caregiver and if detected at any  point, supervision and safe proof recommendations should be instituted.  MEDICATION SUPERVISION: Inability to self-administer medication needs to be constantly addressed. Implement a mechanism to ensure safe administration of the medications.   DRIVING: Regarding driving, in patients with progressive memory problems, driving will be  impaired. We advise to have someone else do the driving if trouble finding directions or if minor accidents are reported. Independent driving assessment is available to determine safety of driving.   If you are interested in the driving assessment, you can contact the following:  [facility] [employer] in [place] [phone]  [facility] [phone]  [facility] [phone] or [phone]    Mediterranean Diet A Mediterranean diet refers to food and lifestyle choices that are based on the traditions of countries located on the [employer] [place]. This way of eating has been shown to help prevent certain conditions and improve outcomes for people who have chronic diseases, like kidney disease and heart disease. What are tips for following this plan? Lifestyle  Cook and eat meals together with your family, when possible. Drink enough fluid to keep your urine clear or pale yellow. Be physically active every day. This includes: Aerobic exercise like running or swimming. Leisure activities like gardening, walking, or housework. Get 7-8 hours of sleep each night. If recommended by your health care provider, drink red wine in moderation. This means 1 glass a day for nonpregnant women and 2 glasses a day for men. A glass of wine equals 5 oz (150 mL). Reading food labels  Check the serving size of packaged foods. For foods such as rice and pasta, the serving size refers to the amount of cooked product, not dry. Check the total fat in packaged foods. Avoid foods that have saturated fat or trans fats. Check the ingredients list for added sugars, such as corn syrup. Shopping  At the grocery store, buy most of your food from the areas near the walls of the store. This includes: Fresh fruits and vegetables (produce). Grains, beans, nuts, and seeds. Some of these may be available in unpackaged forms or large amounts (in  bulk). Fresh seafood. Poultry and eggs. Low-fat dairy products. Buy whole ingredients instead of prepackaged foods. Buy fresh fruits and vegetables in-season from local farmers markets. Buy frozen fruits and vegetables in resealable bags. If you do not have access to quality fresh seafood, buy precooked frozen shrimp or canned fish, such as tuna, salmon, or sardines. Buy small amounts of raw or cooked vegetables, salads, or olives from the deli or salad bar at your store. Stock your pantry so you always have certain foods on hand, such as olive oil, canned tuna, canned tomatoes, rice, pasta, and beans. Cooking  Cook foods with extra-virgin olive oil instead of using butter or other vegetable oils. Have meat as a side dish, and have vegetables or grains as your main dish. This means having meat in small portions or adding small amounts of meat to foods like pasta or stew. Use beans or vegetables instead of meat in common dishes like chili or lasagna. Experiment with different cooking methods. Try roasting or broiling vegetables instead of steaming or sauteing them. Add frozen vegetables to soups, stews, pasta, or rice. Add nuts or seeds for added healthy fat at each meal. You can add these to yogurt, salads, or vegetable dishes. Marinate fish or vegetables using olive oil, lemon juice, garlic, and fresh herbs. Meal planning  Plan to eat 1 vegetarian meal one day  each week. Try to work up to 2 vegetarian meals, if possible. Eat seafood 2 or more times a week. Have healthy snacks readily available, such as: Vegetable sticks with hummus. Greek yogurt. Fruit and nut trail mix. Eat balanced meals throughout the week. This includes: Fruit: 2-3 servings a day Vegetables: 4-5 servings a day Low-fat dairy: 2 servings a day Fish, poultry, or lean meat: 1 serving a day Beans and legumes: 2 or more servings a week Nuts and seeds: 1-2 servings a day Whole grains: 6-8 servings a day Extra-virgin  olive oil: 3-4 servings a day Limit red meat and sweets to only a few servings a month What are my food choices? Mediterranean diet Recommended Grains: Whole-grain pasta. Brown rice. Bulgar wheat. Polenta. Couscous. Whole-wheat bread. Orpah Cobb. Vegetables: Artichokes. Beets. Broccoli. Cabbage. Carrots. Eggplant. Green beans. Chard. Kale. Spinach. Onions. Leeks. Peas. Squash. Tomatoes. Peppers. Radishes. Fruits: Apples. Apricots. Avocado. Berries. Bananas. Cherries. Dates. Figs. Grapes. Lemons. Melon. Oranges. Peaches. Plums. Pomegranate. Meats and other protein foods: Beans. Almonds. Sunflower seeds. Pine nuts. Peanuts. Cod. Salmon. Scallops. Shrimp. Tuna. Tilapia. Clams. Oysters. Eggs. Dairy: Low-fat milk. Cheese. Greek yogurt. Beverages: Water. Red wine. Herbal tea. Fats and oils: Extra virgin olive oil. Avocado oil. Grape seed oil. Sweets and desserts: [place] yogurt with honey. Baked apples. Poached pears. Trail mix. Seasoning and other foods: Basil. Cilantro. Coriander. Cumin. Mint. Parsley. Sage. Rosemary. Tarragon. Garlic. Oregano. Thyme. Pepper. Balsalmic vinegar. Tahini. Hummus. Tomato sauce. Olives. Mushrooms. Limit these Grains: Prepackaged pasta or rice dishes. Prepackaged cereal with added sugar. Vegetables: Deep fried potatoes (french fries). Fruits: Fruit canned in syrup. Meats and other protein foods: Beef. Pork. Lamb. Poultry with skin. Hot dogs. Tomasa Blase. Dairy: Ice cream. Sour cream. Whole milk. Beverages: Juice. Sugar-sweetened soft drinks. Beer. Liquor and spirits. Fats and oils: Butter. Canola oil. Vegetable oil. Beef fat (tallow). Lard. Sweets and desserts: Cookies. Cakes. Pies. Candy. Seasoning and other foods: Mayonnaise. Premade sauces and marinades. The items listed may not be a complete list. Talk with your dietitian about what dietary choices are right for you. Summary The Mediterranean diet includes both food and lifestyle choices. Eat a variety of fresh  fruits and vegetables, beans, nuts, seeds, and whole grains. Limit the amount of red meat and sweets that you eat. Talk with your health care provider about whether it is safe for you to drink red wine in moderation. This means 1 glass a day for nonpregnant women and 2 glasses a day for men. A glass of wine equals 5 oz (150 mL). This information is not intended to replace advice given to you by your health care provider. Make sure you discuss any questions you have with your health care provider. Document Released: 03/25/2016 Document Revised: 04/27/2016 Document Reviewed: 03/25/2016 Elsevier Interactive Patient Education  2017 [employer].   We have sent a referral to [facility] for your MRI and they will call you directly to schedule your appointment. They are located at [address] [facility]. If you need to contact them directly please call [phone].  Please go to [address] for labs

## 2023-02-24 ENCOUNTER — Encounter: Payer: Self-pay | Admitting: *Deleted

## 2023-02-24 NOTE — Progress Notes (Signed)
Pt attended 01/22/23 screening event where her b/p was 147/93 and her blood sugar was 152 and her A1C was 6.9. At the event, the pt did not identify a PCP and did note a food insecurity for which the event nurse said [employer] food resources were given. Chart review indicates pt had established care with Dr. Jacinta Shoe at [facility], where she was last seen on 02/15/2023 and her b/p was 110/70. PCP notes indicate pt is being followed for diabetes and hypertension and pt has a future appt with her PCP on 05/19/23. No additional health equity team support indicated at this time.

## 2023-04-27 DIAGNOSIS — M1711 Unilateral primary osteoarthritis, right knee: Secondary | ICD-10-CM | POA: Diagnosis not present

## 2023-05-19 ENCOUNTER — Ambulatory Visit: Payer: Medicare HMO | Admitting: Internal Medicine

## 2023-05-19 ENCOUNTER — Encounter: Payer: Self-pay | Admitting: Internal Medicine

## 2023-05-19 VITALS — BP 110/70 | HR 73 | Temp 98.6°F | Ht 61.0 in | Wt 165.0 lb

## 2023-05-19 DIAGNOSIS — M1711 Unilateral primary osteoarthritis, right knee: Secondary | ICD-10-CM | POA: Diagnosis not present

## 2023-05-19 DIAGNOSIS — Z7984 Long term (current) use of oral hypoglycemic drugs: Secondary | ICD-10-CM | POA: Diagnosis not present

## 2023-05-19 DIAGNOSIS — E119 Type 2 diabetes mellitus without complications: Secondary | ICD-10-CM | POA: Diagnosis not present

## 2023-05-19 DIAGNOSIS — Z23 Encounter for immunization: Secondary | ICD-10-CM | POA: Diagnosis not present

## 2023-05-19 LAB — COMPREHENSIVE METABOLIC PANEL
ALT: 28 U/L (ref 0–35)
AST: 26 U/L (ref 0–37)
Albumin: 4.8 g/dL (ref 3.5–5.2)
Alkaline Phosphatase: 104 U/L (ref 39–117)
BUN: 13 mg/dL (ref 6–23)
CO2: 31 meq/L (ref 19–32)
Calcium: 10.6 mg/dL — ABNORMAL HIGH (ref 8.4–10.5)
Chloride: 98 meq/L (ref 96–112)
Creatinine, Ser: 1.37 mg/dL — ABNORMAL HIGH (ref 0.40–1.20)
GFR: 37.51 mL/min — ABNORMAL LOW (ref >60.00)
Glucose, Bld: 111 mg/dL — ABNORMAL HIGH (ref 70–99)
Potassium: 3.6 meq/L (ref 3.5–5.1)
Sodium: 135 meq/L (ref 135–145)
Total Bilirubin: 1 mg/dL (ref 0.2–1.2)
Total Protein: 8.6 g/dL — ABNORMAL HIGH (ref 6.0–8.3)

## 2023-05-19 NOTE — Progress Notes (Signed)
Subjective:  Patient ID: Monica Horn, female    DOB: 29-Dec-1945  Age: 77 y.o. MRN: [id number]  CC: Follow-up (3 [facility] F/U)   HPI Charene J Schumacher presents for knee OA - R knee almost gave way. Pt saw Dr Aluisio's PA, had a shot - better F/u HTN, GERD  Outpatient Medications Prior to Visit  Medication Sig Dispense Refill   aspirin 81 MG chewable tablet Chew by mouth daily.     celecoxib (CELEBREX) 200 MG capsule TAKE 1 CAPSULE EVERY DAY AS NEEDED 90 capsule 3   cetirizine (ZYRTEC) 10 MG tablet Take 1 tablet (10 mg total) by mouth daily as needed. 90 tablet 3   cholecalciferol (VITAMIN D3) 10 MCG (400 UNIT) TABS tablet Take 1,000 Units by mouth daily.     fluticasone (FLONASE) 50 MCG/ACT nasal spray INSTILL 2 SPRAYS INTO BOTH NOSTRILS ONCE DAILY 16 g 5   memantine (NAMENDA) 5 MG tablet Take 1 tablet (5 mg total) by mouth 2 (two) times daily. 60 tablet 11   Multiple Vitamin (MULTIVITAMIN) tablet Take 1 tablet by mouth daily.     NON FORMULARY OTC [place] MAIN  Once a day     pantoprazole (PROTONIX) 40 MG tablet TAKE 1 TABLET EVERY DAY 90 tablet 3   potassium chloride SA (KLOR-CON M) 20 MEQ tablet Take 1 tablet (20 mEq total) by mouth daily. 90 tablet 3   repaglinide (PRANDIN) 1 MG tablet Take 1 tablet (1 mg total) by mouth 3 (three) times daily before meals. 270 tablet 3   triamterene-hydrochlorothiazide (MAXZIDE-25) 37.5-25 MG tablet TAKE 1 TABLET EVERY DAY 90 tablet 3   trolamine salicylate (ASPERCREME) 10 % cream Apply 1 application topically 2 (two) times daily as needed for muscle pain.     furosemide (LASIX) 20 MG tablet TAKE 1 TO 2 TABLETS EVERY DAY (Patient not taking: Reported on 05/19/2023) 180 tablet 3   No facility-administered medications prior to visit.    ROS: Review of Systems  Constitutional:  Negative for activity change, appetite change, chills, fatigue and unexpected weight change.  HENT:  Negative for congestion, mouth sores and sinus pressure.   Eyes:  Negative for  visual disturbance.  Respiratory:  Negative for cough and chest tightness.   Gastrointestinal:  Negative for abdominal pain and nausea.  Genitourinary:  Negative for difficulty urinating, frequency and vaginal pain.  Musculoskeletal:  Positive for arthralgias and gait problem. Negative for back pain.  Skin:  Negative for pallor and rash.  Neurological:  Negative for dizziness, tremors, weakness, numbness and headaches.  Psychiatric/Behavioral:  Negative for confusion and sleep disturbance.     Objective:  BP 110/70 (BP Location: Right Arm, Patient Position: Sitting, Cuff Size: Normal)   Pulse 73   Temp 98.6 F (37 C) (Oral)   Ht 5\' 1"  (1.549 m)   Wt 165 lb (74.8 kg)   SpO2 98%   BMI 31.18 kg/m   BP Readings from Last 3 Encounters:  05/19/23 110/70  02/21/23 (!) 144/86  02/15/23 110/70    Wt Readings from Last 3 Encounters:  05/19/23 165 lb (74.8 kg)  02/21/23 173 lb 9.6 oz (78.7 kg)  02/15/23 173 lb (78.5 kg)    Physical Exam Constitutional:      General: She is not in acute distress.    Appearance: She is well-developed. She is obese.  HENT:     Head: Normocephalic.     Right Ear: External ear normal.     Left Ear: External ear  normal.     Nose: Nose normal.  Eyes:     General:        Right eye: No discharge.        Left eye: No discharge.     Conjunctiva/sclera: Conjunctivae normal.     Pupils: Pupils are equal, round, and reactive to light.  Neck:     Thyroid: No thyromegaly.     Vascular: No JVD.     Trachea: No tracheal deviation.  Cardiovascular:     Rate and Rhythm: Normal rate and regular rhythm.     Heart sounds: Normal heart sounds.  Pulmonary:     Effort: No respiratory distress.     Breath sounds: No stridor. No wheezing.  Abdominal:     General: Bowel sounds are normal. There is no distension.     Palpations: Abdomen is soft. There is no mass.     Tenderness: There is no abdominal tenderness. There is no guarding or rebound.  Musculoskeletal:         General: No tenderness.     Cervical back: Normal range of motion and neck supple. No rigidity.  Lymphadenopathy:     Cervical: No cervical adenopathy.  Skin:    Findings: No erythema or rash.  Neurological:     Cranial Nerves: No cranial nerve deficit.     Motor: No abnormal muscle tone.     Coordination: Coordination normal.     Deep Tendon Reflexes: Reflexes normal.  Psychiatric:        Behavior: Behavior normal.        Thought Content: Thought content normal.        Judgment: Judgment normal.   B knees w/mild pain on ROM, R>>L LS spine, str leg elev (-)  Lab Results  Component Value Date   WBC 7.4 01/06/2022   HGB 13.5 01/06/2022   HCT 43.8 01/06/2022   PLT 315.0 01/06/2022   GLUCOSE 111 (H) 05/19/2023   CHOL 222 (H) 11/16/2022   TRIG 273.0 (H) 11/16/2022   HDL 40.40 11/16/2022   LDLDIRECT 124.0 11/16/2022   LDLCALC 130 (H) 06/24/2017   ALT 28 05/19/2023   AST 26 05/19/2023   NA 135 05/19/2023   K 3.6 05/19/2023   CL 98 05/19/2023   CREATININE 1.37 (H) 05/19/2023   BUN 13 05/19/2023   CO2 31 05/19/2023   TSH 1.61 01/06/2022   INR 0.9 10/13/2018   HGBA1C 6.3 05/19/2023   MICROALBUR <0.7 11/16/2022    No results found.  Assessment & Plan:   Problem List Items Addressed This Visit     Type II diabetes mellitus - Primary    Repaginade - re-started Check A1c      Relevant Orders   Hemoglobin A1c (Completed)   Comprehensive metabolic panel (Completed)   Osteoarthritis of right knee     knee OA - R knee almost gave way. Pt saw Dr Aluisio's PA, had a shot - better      Other Visit Diagnoses     Need for influenza vaccination       Relevant Orders   Flu Vaccine Trivalent High Dose (Fluad) (Completed)         No orders of the defined types were placed in this encounter.     Follow-up: Return in about 3 months (around 08/19/2023) for a follow-up visit.  Sonda Primes, MD

## 2023-05-19 NOTE — Assessment & Plan Note (Signed)
knee OA - R knee almost gave way. Pt saw Dr Aluisio's PA, had a shot - better

## 2023-05-19 NOTE — Assessment & Plan Note (Signed)
Repaginade - re-started Check A1c

## 2023-05-20 LAB — HEMOGLOBIN A1C: Hgb A1c MFr Bld: 6.3 % (ref 4.6–6.5)

## 2023-05-31 ENCOUNTER — Encounter: Payer: Self-pay | Admitting: Internal Medicine

## 2023-06-27 ENCOUNTER — Ambulatory Visit (INDEPENDENT_AMBULATORY_CARE_PROVIDER_SITE_OTHER): Payer: Medicare HMO

## 2023-06-27 VITALS — BP 115/78 | HR 73 | Ht 60.75 in | Wt 171.4 lb

## 2023-06-27 DIAGNOSIS — Z780 Asymptomatic menopausal state: Secondary | ICD-10-CM

## 2023-06-27 DIAGNOSIS — Z0000 Encounter for general adult medical examination without abnormal findings: Secondary | ICD-10-CM

## 2023-06-27 NOTE — Patient Instructions (Signed)
Monica Horn , Thank you for taking time to come for your Medicare Wellness Visit. I appreciate your ongoing commitment to your health goals. Please review the following plan we discussed and let me know if I can assist you in the future.   Referrals/Orders/Follow-Ups/Clinician Recommendations:  It was so nice to meet and talk with you today.  Keep up the great work.  Remember You have an order for:  [x]   Bone Density     Please call for appointment:  [facility] [address] [place], [place] [address] [phone]   Make sure to wear two-piece clothing.  No lotions, powders, or deodorants the day of the appointment. Make sure to bring picture ID and insurance card.  Bring list of medications you are currently taking including any supplements.     This is a list of the screening recommended for you and due dates:  Health Maintenance  Topic Date Due   Eye exam for diabetics  01/23/2022   COVID-19 Vaccine (6 - 2023-24 season) 07/15/2023*   Complete foot exam   08/22/2023*   Colon Cancer Screening  11/05/2023   Yearly kidney health urinalysis for diabetes  11/16/2023   Hemoglobin A1C  11/17/2023   Yearly kidney function blood test for diabetes  05/18/2024   Medicare Annual Wellness Visit  06/26/2024   DTaP/Tdap/Td vaccine (3 - Td or Tdap) 09/09/2032   Pneumonia Vaccine  Completed   Flu Shot  Completed   DEXA scan (bone density measurement)  Completed   Hepatitis C Screening  Completed   HPV Vaccine  Aged Out   Zoster (Shingles) Vaccine  Discontinued  *Topic was postponed. The date shown is not the original due date.    Advanced directives: (Provided) Advance directive discussed with you today. I have provided a copy for you to complete at home and have notarized. Once this is complete, please bring a copy in to our office so we can scan it into your chart.   Next Medicare Annual Wellness Visit scheduled for next year: Yes

## 2023-06-27 NOTE — Progress Notes (Cosign Needed Addendum)
Subjective:   Monica Horn is a 77 y.o. female who presents for Medicare Annual (Subsequent) preventive examination.  Visit Complete: In person   Cardiac Risk Factors include: advanced age (>39men, >33 women);hypertension;diabetes mellitus;dyslipidemia     Objective:    Today's Vitals   06/27/23 1311  BP: 115/78  Pulse: 73  SpO2: 99%  Weight: 171 lb 6.4 oz (77.7 kg)  Height: 5' 0.75" (1.543 m)   Body mass index is 32.65 kg/m.     06/27/2023    1:29 PM 08/23/2022    2:30 PM 07/22/2022    1:39 PM 06/15/2022    2:51 PM 01/23/2021    2:39 PM 11/20/2019    2:04 PM 10/18/2018    2:00 PM  Advanced Directives  Does Patient Have a Medical Advance Directive? No No No Yes No No No  Type of Theme park manager;Living will     Does patient want to make changes to medical advance directive?       No - Patient declined  Copy of Healthcare Power of Attorney in Chart?    No - copy requested     Would patient like information on creating a medical advance directive?     Yes (MAU/Ambulatory/Procedural Areas - Information given) Yes (MAU/Ambulatory/Procedural Areas - Information given) No - Patient declined    Current Medications (verified) Outpatient Encounter Medications as of 06/27/2023  Medication Sig   aspirin 81 MG chewable tablet Chew by mouth daily.   celecoxib (CELEBREX) 200 MG capsule TAKE 1 CAPSULE EVERY DAY AS NEEDED   cetirizine (ZYRTEC) 10 MG tablet Take 1 tablet (10 mg total) by mouth daily as needed.   cholecalciferol (VITAMIN D3) 10 MCG (400 UNIT) TABS tablet Take 1,000 Units by mouth daily.   fluticasone (FLONASE) 50 MCG/ACT nasal spray INSTILL 2 SPRAYS INTO BOTH NOSTRILS ONCE DAILY   furosemide (LASIX) 20 MG tablet TAKE 1 TO 2 TABLETS EVERY DAY   Multiple Vitamin (MULTIVITAMIN) tablet Take 1 tablet by mouth daily.   NON FORMULARY OTC LION MAIN  Once a day   pantoprazole (PROTONIX) 40 MG tablet TAKE 1 TABLET EVERY DAY   potassium chloride  SA (KLOR-CON M) 20 MEQ tablet Take 1 tablet (20 mEq total) by mouth daily.   repaglinide (PRANDIN) 1 MG tablet Take 1 tablet (1 mg total) by mouth 3 (three) times daily before meals.   triamterene-hydrochlorothiazide (MAXZIDE-25) 37.5-25 MG tablet TAKE 1 TABLET EVERY DAY   trolamine salicylate (ASPERCREME) 10 % cream Apply 1 application topically 2 (two) times daily as needed for muscle pain.   memantine (NAMENDA) 5 MG tablet Take 1 tablet (5 mg total) by mouth 2 (two) times daily. (Patient not taking: Reported on 06/27/2023)   No facility-administered encounter medications on file as of 06/27/2023.    Allergies (verified) Amlodipine, Ibuprofen, Benazepril, Losartan, and Lovastatin   History: Past Medical History:  Diagnosis Date   Abnormal EKG 2014   NONSPECIFIC ST DEPRESSION + T ANORMALITY, WORKED UP 2014 DR Sharrell Ku AND RELEASED BY DR Ladona Ridgel   Acute sinusitis 07/01/2021   11/22, 2/23 Omnicef  Augmentin bid x 10 d   Acute tonsillitis 08/05/2008   Allergic rhinitis 11/11/2015   3//17 Zyrtec  Use Sinus rinse - given   Anemia, unspecified 10/27/2007   Ankle swelling 04/23/2019   Blood in stool 10/27/2007   Recurrent   Chronic   Dr Russella Dar  Colon is due this fall 2019   Blood transfusion  without reported diagnosis    Carpal tunnel syndrome 10/27/2007   R>L  She had L wrist operated on      Cerumen impaction 12/01/2009   B wax      Chest pain, atypical 08/26/2012   Chronic knee pain after total replacement of right knee joint 04/21/2022   Closed undisplaced fracture of nasal bone 12/09/2016   Colon polyps    Cystitis 09/08/2009   Dizziness 04/04/2009   Dog bite 09/27/2022   Face 09/2022  S/p Rabies shots   Dyslipidemia 06/20/2007   Chronic   Statin intolerant  On diet  Use fish oil   Eruption cyst 04/05/2014   Fatty infiltration of liver    Finger contusion 11/30/2010   R 4th   Foot pain 06/13/2012   2020 L dorsal pain - OA/capsulitis  2021 L ankle pain since the Fall of  2020 - saw dr Charlsie Merles, was wearing a boot in may 2021  "Inflammatory tendinitis left posterior tib along with flatfoot deformity which is contributory towards the problem"  6/21 Treat edema of LEs  Medrol pac  Sports med ref if not better   Forehead contusion 11/19/2016   C/o hitting a cabinet after chocking on ice - fell; ?passed out at the client's house November 04 2016; had a hematoma on the central forehead. She had a HA x1d. She had a black eye later. No n/v.  Nose bones X ray IMPRESSION:  Depressed anterior nasal bone fractures bilaterally.     Mild swelling of the nasal septal soft tissues.        Electronically Signed    By: Delbert Phenix M.D.    On: 04/12/201   Ganglion cyst 11/11/2015   L wrist tender 1 cm ganglion 3/17  Dr Amanda Pea  ?L foot 2020   Gastric polyp 07/18/2018   GERD (gastroesophageal reflux disease)    Hematochezia 10/27/2007   Hemorrhage of gastrointestinal tract 10/27/2007   Hemorrhoids 10/27/2007   History of COVID-19 09/01/2019   History of revision of total replacement of left hip joint 11/17/2018   HTN (hypertension) 07/11/2007   Chronic - on Norvasc - d/c (swelling)  D/c Benazepril due to cough 6/16 - restarted 8/16  D/c Losartan due to HA  9/20 - Cont on Maxzide   Hydradenitis 01/2009   Right S aureus   Ingrown right big toenail 09/27/2014   Near syncope 10/13/2012   2/14  Abn ECHO   OA (osteoarthritis) of hip 10/18/2018   Osteoarthritis of right knee 04/25/2022   Otitis externa 07/27/2010   2020  Corisporin      Rash and nonspecific skin eruption 04/05/2014   8/15 recurrent - R inner thigh  ?herpes - pt decline Rx   Shoulder pain, right 11/30/2010   R rot cuff strain, repeat 2023  Arnica gel or cream on bruises     [facility]  Blue-Emu cream was recommended  2-3 times a day   Sinus infection 10/02/2018   Statin intolerance 07/29/2020   Tennis elbow 01/13/2021   5/22 L  Ice, Voltaren gel, massage   Tubulovillous adenoma polyp of colon 03/2007   w/  focal high grade dysplasia, no carcinoma   Type II diabetes mellitus 06/12/2007   Diet controlled  CT ca scoring info given 12/19, advised     Not taking Repaginate - re-start  Risks associated with treatment noncompliance were discussed. Compliance was encouraged.  Re-start Rybelsus - use Assistance program, if covered - too $$$  URI, acute 06/11/2011   10/12, 10/13, 4/15, 11/15, 3/16, 6/16, 2/20, 1/21   UTI (urinary tract infection) 01/04/2014   Past Surgical History:  Procedure Laterality Date   CARPAL TUNNEL RELEASE     Lt  wrist, rt wrist 2014   COLONOSCOPY     POLYPECTOMY     TOTAL HIP ARTHROPLASTY Left 10/18/2018   Procedure: TOTAL HIP ARTHROPLASTY ANTERIOR APPROACH;  Surgeon: Ollen Gross, MD;  Location: [facility];  Service: Orthopedics;  Laterality: Left;    TUBAL LIGATION     Family History  Problem Relation Age of Onset   Stroke Mother    Hypertension Mother    Lung cancer Mother    Cancer Father        ? liver ca   Alcohol abuse Father    Hypertension Other    Stroke Other    Colon cancer Neg Hx    Colon polyps Neg Hx    Rectal cancer Neg Hx    Stomach cancer Neg Hx    Social History   Socioeconomic History   Marital status: Married    Spouse name: Not on file   Number of children: Not on file   Years of education: 13   Highest education level: Some college, no degree  Occupational History   Occupation: Retired/  Tobacco Use   Smoking status: Former    Current packs/day: 0.00    Types: Cigarettes    Quit date: 1979    Years since quitting: 45.8   Smokeless tobacco: Never  Vaping Use   Vaping status: Never Used  Substance and Sexual Activity   Alcohol use: Yes    Comment: occasional   Drug use: No   Sexual activity: Yes  Other Topics Concern   Not on file  Social History Narrative   Rigth handed   Lives with husband   One story home   Occasionally cafffeine   Social Determinants of Health   Financial Resource Strain: Low Risk   (06/27/2023)   Overall Financial Resource Strain (CARDIA)    Difficulty of Paying Living Expenses: Not hard at all  Food Insecurity: No Food Insecurity (06/27/2023)   Hunger Vital Sign    Worried About Running Out of Food in the Last Year: Never true    Ran Out of Food in the Last Year: Never true  Transportation Needs: No Transportation Needs (06/27/2023)   PRAPARE - Administrator, Civil Service (Medical): No    Lack of Transportation (Non-Medical): No  Physical Activity: Unknown (06/27/2023)   Exercise Vital Sign    Days of Exercise per Week: 4 days    Minutes of Exercise per Session: Not on file  Stress: No Stress Concern Present (06/27/2023)   [employer] of Occupational Health - Occupational Stress Questionnaire    Feeling of Stress : Not at all  Social Connections: Unknown (06/27/2023)   Social Connection and Isolation Panel [NHANES]    Frequency of Communication with Friends and Family: More than three times a week    Frequency of Social Gatherings with Friends and Family: More than three times a week    Attends Religious Services: More than 4 times per year    Active Member of [employer] or Organizations: Yes    Attends Engineer, structural: More than 4 times per year    Marital Status: Not on file    Tobacco Counseling Counseling given: Not Answered   Clinical Intake:  Pre-visit preparation completed: Yes  Pain : No/denies pain        How often do you need to have someone help you when you read instructions, pamphlets, or other written materials from your doctor or pharmacy?: 1 - Never  Interpreter Needed?: No  Information entered by :: Wendall Stade, RMA   Activities of Daily Living    06/27/2023    1:06 PM  In your present state of health, do you have any difficulty performing the following activities:  Hearing? 0  Vision? 1  Difficulty concentrating or making decisions? 0  Walking or climbing stairs? 0  Dressing or bathing? 0   Doing errands, shopping? 0  Preparing Food and eating ? N  Using the Toilet? N  In the past six months, have you accidently leaked urine? N  Do you have problems with loss of bowel control? N  Managing your Medications? N  Managing your Finances? N  Housekeeping or managing your Housekeeping? N    Patient Care Team: Plotnikov, Georgina Quint, MD as PCP - General Russella Dar Venita Lick, MD (Gastroenterology) Nelson Chimes, MD as Consulting Physician (Ophthalmology) Charlsie Merles Kirstie Peri, DPM as Consulting Physician (Podiatry) Ollen Gross, MD as Consulting Physician (Orthopedic Surgery) Shon Millet, MD as Consulting Physician (Ophthalmology) Elwyn Reach (Neurology)  Indicate any recent Medical Services you may have received from other than [facility] providers in the past year (date may be approximate).     Assessment:   This is a routine wellness examination for Monica Horn.  Hearing/Vision screen Hearing Screening - Comments:: Denies hearing difficulties   Vision Screening - Comments:: Wears eyeglasses   Goals Addressed   None   Depression Screen    06/27/2023    1:34 PM 05/19/2023    2:06 PM 02/15/2023    1:43 PM 11/16/2022    2:14 PM 08/17/2022    3:01 PM 06/15/2022    1:45 PM 05/12/2022    2:47 PM  PHQ 2/9 Scores  PHQ - 2 Score 0 0 0 0 0 0 0  PHQ- 9 Score 0     0 0    Fall Risk    06/27/2023    2:13 PM 05/19/2023    2:06 PM 02/21/2023    2:36 PM 02/15/2023    1:43 PM 11/16/2022    2:14 PM  Fall Risk   Falls in the past year? 0 0 0 0 0  Number falls in past yr: 0 0 0 0 0  Injury with Fall? 0 0 0 0 0  Risk for fall due to : No Fall Risks No Fall Risks No Fall Risks No Fall Risks No Fall Risks  Follow up Falls prevention discussed;Falls evaluation completed Falls evaluation completed Falls prevention discussed;Falls evaluation completed Falls evaluation completed Falls evaluation completed    MEDICARE RISK AT HOME: Medicare Risk at Home Any stairs in or around the home?:  No If so, are there any without handrails?: No Home free of loose throw rugs in walkways, pet beds, electrical cords, etc?: Yes Adequate lighting in your home to reduce risk of falls?: Yes Life alert?: No Use of a cane, walker or w/c?: No Grab bars in the bathroom?: Yes Shower chair or bench in shower?: Yes Elevated toilet seat or a handicapped toilet?: No  TIMED UP AND GO:  Was the test performed?  Yes  Length of time to ambulate 10 feet: 15 sec Gait steady and fast without use of assistive device    Cognitive Function:    09/09/2017  10:40 AM  MMSE - Mini Mental State Exam  Orientation to time 5  Orientation to Place 5  Registration 3  Attention/ Calculation 5  Recall 2  Language- name 2 objects 2  Language- repeat 1  Language- follow 3 step command 3  Language- read & follow direction 1  Write a sentence 1  Copy design 1  Total score 29      07/27/2022    9:52 AM 07/22/2022    1:00 PM  Montreal Cognitive Assessment   Visuospatial/ Executive (0/5) 1 1  Naming (0/3) 2 2  Attention: Read list of digits (0/2) 2 2  Attention: Read list of letters (0/1) 1 1  Attention: Serial 7 subtraction starting at 100 (0/3) 1 0  Language: Repeat phrase (0/2) 1 1  Language : Fluency (0/1) 1 1  Abstraction (0/2) 2 2  Delayed Recall (0/5) 3 3  Orientation (0/6) 6 6  Total 20 19  Adjusted Score (based on education) 20 19      06/27/2023    2:13 PM 06/15/2022    1:48 PM 11/20/2019    2:08 PM  6CIT Screen  What Year? 0 points 0 points 0 points  What month? 0 points 0 points 0 points  What time? 0 points 0 points 0 points  Count back from 20 0 points 0 points 0 points  Months in reverse 0 points 2 points 0 points  Repeat phrase 0 points 2 points 0 points  Total Score 0 points 4 points 0 points    Immunizations Immunization History  Administered Date(s) Administered   Fluad Quad(high Dose 65+) 04/17/2019, 07/29/2020, 05/05/2021, 05/12/2022   Fluad Trivalent(High Dose 65+)  05/19/2023   Influenza Split 06/13/2012   Influenza Whole 07/24/2002, 05/22/2008, 06/06/2009, 07/27/2010   Influenza, High Dose Seasonal PF 06/20/2017, 05/16/2018, 07/18/2018   Influenza,inj,Quad PF,6+ Mos 08/03/2013, 08/02/2014, 06/27/2015, 05/04/2016   Influenza,inj,quad, With Preservative 06/16/2017   PFIZER(Purple Top)SARS-COV-2 Vaccination 10/09/2019, 10/30/2019, 06/05/2020, 01/22/2021   Pfizer Covid-19 Vaccine Bivalent Booster 55yrs & up 08/12/2022   Pfizer(Comirnaty)Fall Seasonal Vaccine 12 years and older 08/18/2022   Pneumococcal Conjugate-13 10/02/2015   Pneumococcal Polysaccharide-23 06/27/2015   Rabies, IM 09/12/2022, 09/15/2022, 09/19/2022, 09/26/2022   Td 01/31/2009   Td (Adult), 2 Lf Tetanus Toxid, Preservative Free 01/31/2009   Tdap 09/09/2022    TDAP status: Up to date  Flu Vaccine status: Up to date  Pneumococcal vaccine status: Up to date  Covid-19 vaccine status: Information provided on how to obtain vaccines.   Qualifies for Shingles Vaccine? Yes   Zostavax completed No   Shingrix Completed?: No.    Education has been provided regarding the importance of this vaccine. Patient has been advised to call insurance company to determine out of pocket expense if they have not yet received this vaccine. Advised may also receive vaccine at local pharmacy or Health Dept. Verbalized acceptance and understanding.  Screening Tests Health Maintenance  Topic Date Due   COVID-19 Vaccine (6 - 2023-24 season) 07/15/2023 (Originally 04/17/2023)   OPHTHALMOLOGY EXAM  08/16/2023 (Originally 01/23/2022)   FOOT EXAM  08/22/2023 (Originally 09/16/2019)   Colonoscopy  11/05/2023   Diabetic kidney evaluation - Urine ACR  11/16/2023   HEMOGLOBIN A1C  11/17/2023   Diabetic kidney evaluation - eGFR measurement  05/18/2024   Medicare Annual Wellness (AWV)  06/26/2024   DTaP/Tdap/Td (3 - Td or Tdap) 09/09/2032   Pneumonia Vaccine 57+ Years old  Completed   INFLUENZA VACCINE  Completed    DEXA SCAN  Completed   Hepatitis C Screening  Completed   HPV VACCINES  Aged Out   Zoster Vaccines- Shingrix  Discontinued    Health Maintenance  There are no preventive care reminders to display for this patient.   Colorectal cancer screening: Type of screening: Colonoscopy. Completed 11/04/2020. Repeat every 3 years  Mammogram status: Completed 09/22/2022. Repeat every year  Bone Density status: Ordered 06/27/2023. Pt provided with contact info and advised to call to schedule appt.  Lung Cancer Screening: (Low Dose CT Chest recommended if Age 76-80 years, 20 pack-year currently smoking OR have quit w/in 15years.) does not qualify.   Lung Cancer Screening Referral: N/A  Additional Screening:  Hepatitis C Screening: does qualify; Completed 04/16/2016  Vision Screening: Recommended annual ophthalmology exams for early detection of glaucoma and other disorders of the eye. Is the patient up to date with their annual eye exam?  No  Who is the provider or what is the name of the office in which the patient attends annual eye exams? Dr. Randon Goldsmith office If pt is not established with a provider, would they like to be referred to a provider to establish care? No .   Dental Screening: Recommended annual dental exams for proper oral hygiene  Diabetic Foot Exam: Diabetic Foot Exam: Overdue, Pt has been advised about the importance in completing this exam. Pt is scheduled for diabetic foot exam on 08/21/2022.  Community Resource Referral / Chronic Care Management: CRR required this visit?  No   CCM required this visit?  No     Plan:     I have personally reviewed and noted the following in the patient's chart:   Medical and social history Use of alcohol, tobacco or illicit drugs  Current medications and supplements including opioid prescriptions. Patient is not currently taking opioid prescriptions. Functional ability and status Nutritional status Physical activity Advanced  directives List of other physicians Hospitalizations, surgeries, and ER visits in previous 12 months Vitals Screenings to include cognitive, depression, and falls Referrals and appointments  In addition, I have reviewed and discussed with patient certain preventive protocols, quality metrics, and best practice recommendations. A written personalized care plan for preventive services as well as general preventive health recommendations were provided to patient.     Monica Horn, CMA   06/29/2023   After Visit Summary: (MyChart) Due to this being a telephonic visit, the after visit summary with patients personalized plan was offered to patient via MyChart   Nurse Notes: Patient is due for a Covid vaccine and a Shingrix vaccine.  She stated that she will think about getting the Shingles vaccine.  Patient is due for a DEXA and an order has been placed today.  Patient stated that she has an appointment coming on 07/07/2023 for her eye exam.  She had no other concerns to address today.      Medical screening examination/treatment/procedure(s) were performed by non-physician practitioner and as supervising physician I was immediately available for consultation/collaboration.  I agree with above. Jacinta Shoe, MD

## 2023-07-04 DIAGNOSIS — M25561 Pain in right knee: Secondary | ICD-10-CM | POA: Diagnosis not present

## 2023-07-05 ENCOUNTER — Ambulatory Visit
Admission: RE | Admit: 2023-07-05 | Discharge: 2023-07-05 | Disposition: A | Discharge status: Home / Self Care | Payer: Medicare HMO | Source: Ambulatory Visit | Attending: Internal Medicine | Admitting: Internal Medicine

## 2023-07-05 DIAGNOSIS — M8588 Other specified disorders of bone density and structure, other site: Secondary | ICD-10-CM | POA: Diagnosis not present

## 2023-07-05 DIAGNOSIS — N958 Other specified menopausal and perimenopausal disorders: Secondary | ICD-10-CM | POA: Diagnosis not present

## 2023-07-05 DIAGNOSIS — Z780 Asymptomatic menopausal state: Secondary | ICD-10-CM

## 2023-07-05 DIAGNOSIS — Z0000 Encounter for general adult medical examination without abnormal findings: Secondary | ICD-10-CM

## 2023-07-05 DIAGNOSIS — E2839 Other primary ovarian failure: Secondary | ICD-10-CM | POA: Diagnosis not present

## 2023-07-27 ENCOUNTER — Other Ambulatory Visit: Payer: Self-pay | Admitting: Internal Medicine

## 2023-08-01 DIAGNOSIS — M1711 Unilateral primary osteoarthritis, right knee: Secondary | ICD-10-CM | POA: Diagnosis not present

## 2023-08-08 DIAGNOSIS — M1711 Unilateral primary osteoarthritis, right knee: Secondary | ICD-10-CM | POA: Diagnosis not present

## 2023-08-16 DIAGNOSIS — M1711 Unilateral primary osteoarthritis, right knee: Secondary | ICD-10-CM | POA: Diagnosis not present

## 2023-08-22 ENCOUNTER — Encounter: Payer: Self-pay | Admitting: Internal Medicine

## 2023-08-22 ENCOUNTER — Ambulatory Visit (INDEPENDENT_AMBULATORY_CARE_PROVIDER_SITE_OTHER): Payer: Medicare (Managed Care) | Admitting: Internal Medicine

## 2023-08-22 VITALS — BP 112/80 | HR 91 | Temp 97.7°F | Ht 60.75 in | Wt 168.0 lb

## 2023-08-22 DIAGNOSIS — Z789 Other specified health status: Secondary | ICD-10-CM

## 2023-08-22 DIAGNOSIS — I10 Essential (primary) hypertension: Secondary | ICD-10-CM | POA: Diagnosis not present

## 2023-08-22 DIAGNOSIS — E119 Type 2 diabetes mellitus without complications: Secondary | ICD-10-CM | POA: Diagnosis not present

## 2023-08-22 DIAGNOSIS — E785 Hyperlipidemia, unspecified: Secondary | ICD-10-CM | POA: Diagnosis not present

## 2023-08-22 DIAGNOSIS — K635 Polyp of colon: Secondary | ICD-10-CM

## 2023-08-22 DIAGNOSIS — M25561 Pain in right knee: Secondary | ICD-10-CM

## 2023-08-22 DIAGNOSIS — G8929 Other chronic pain: Secondary | ICD-10-CM | POA: Insufficient documentation

## 2023-08-22 IMAGING — MG MM DIGITAL SCREENING BILAT W/ TOMO AND CAD
8 series · 8 of 24 positions shown · non-contrast
Comparison: Previous exam(s).

CLINICAL DATA: Screening.

EXAM:
DIGITAL SCREENING BILATERAL MAMMOGRAM WITH TOMOSYNTHESIS AND CAD
TECHNIQUE: Bilateral screening digital craniocaudal and mediolateral oblique
mammograms were obtained. Bilateral screening digital breast
tomosynthesis was performed. The images were evaluated with
computer-aided detection.

[L CC synth-2D]
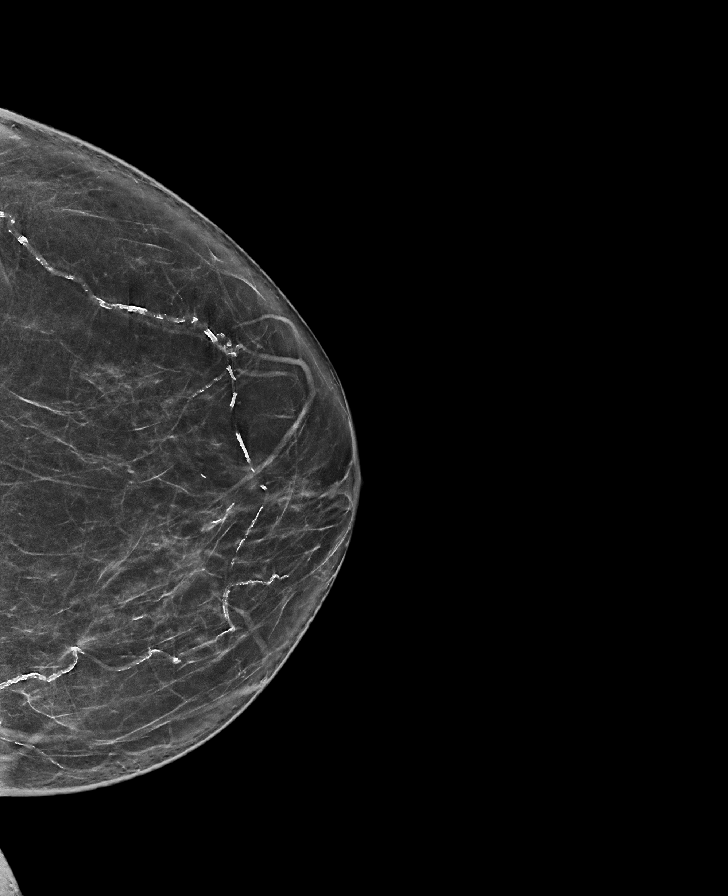

[R CC synth-2D]
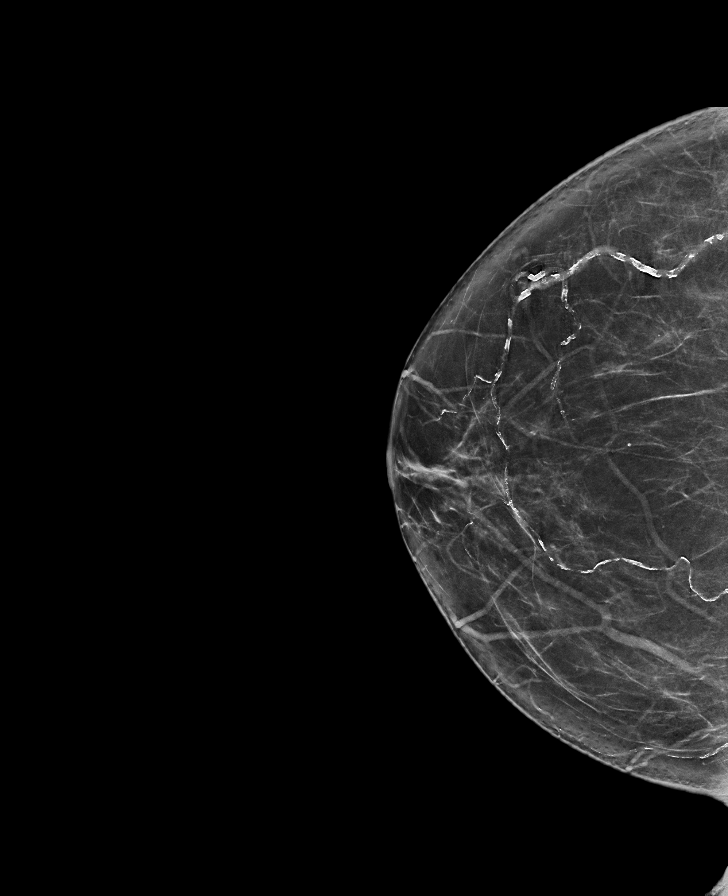

[L MLO synth-2D]
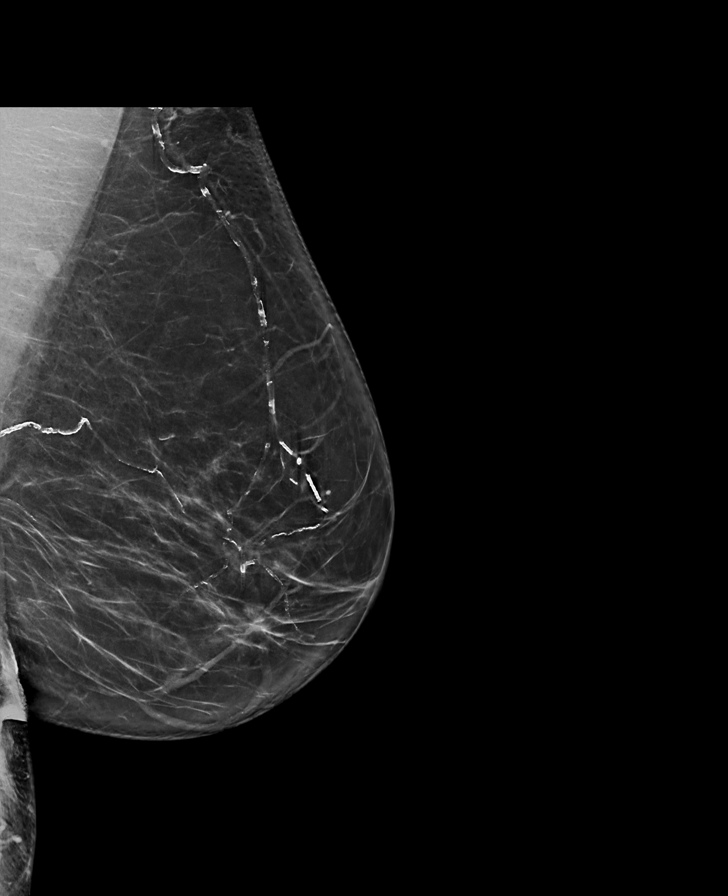

[R MLO synth-2D]
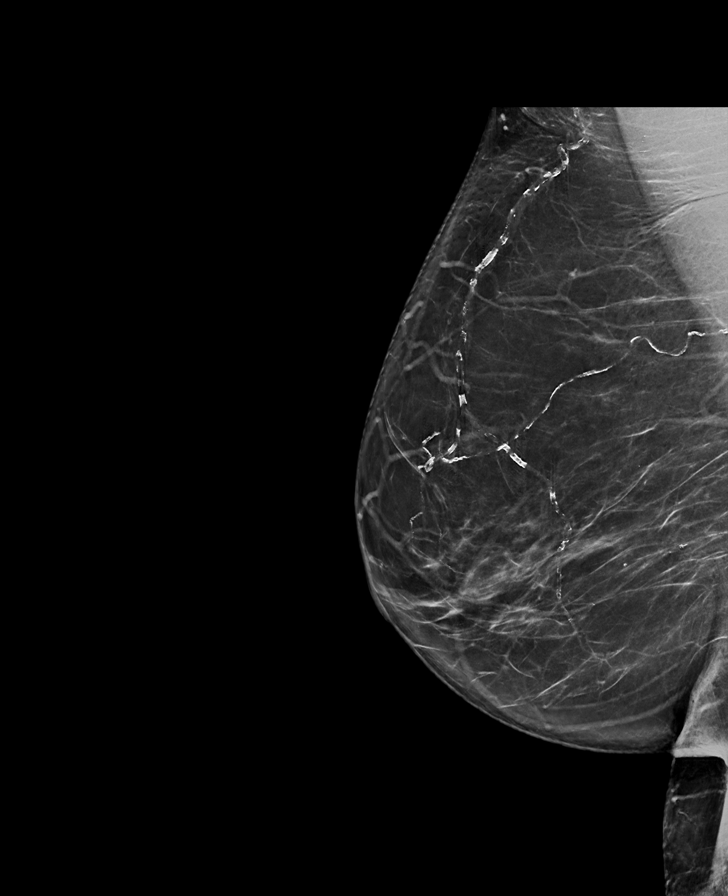

[L MLO tomo · tomo slice 42/83.0]
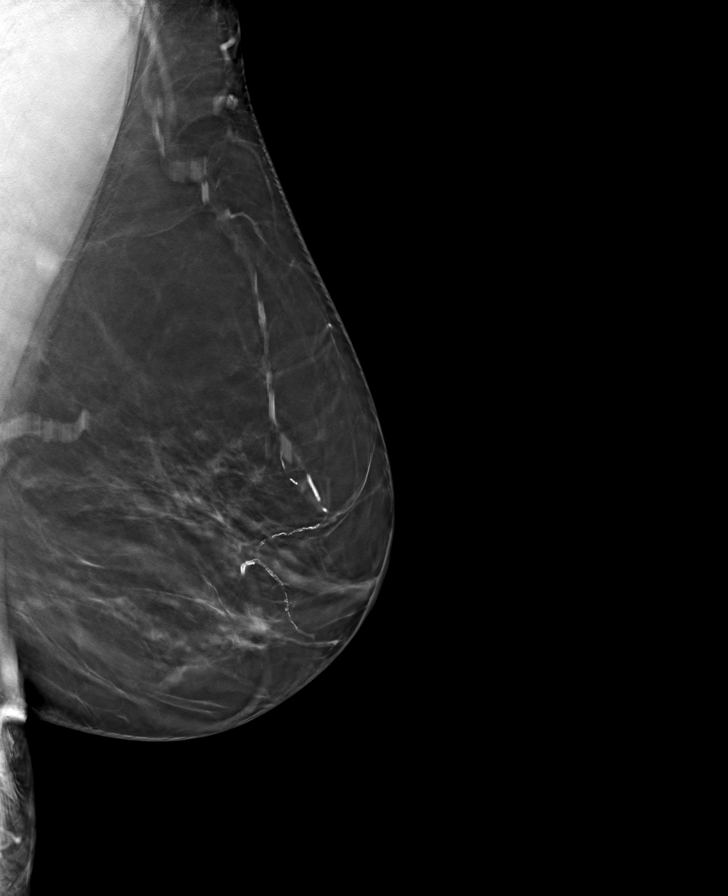

[R CC tomo · tomo slice 39/77.0]
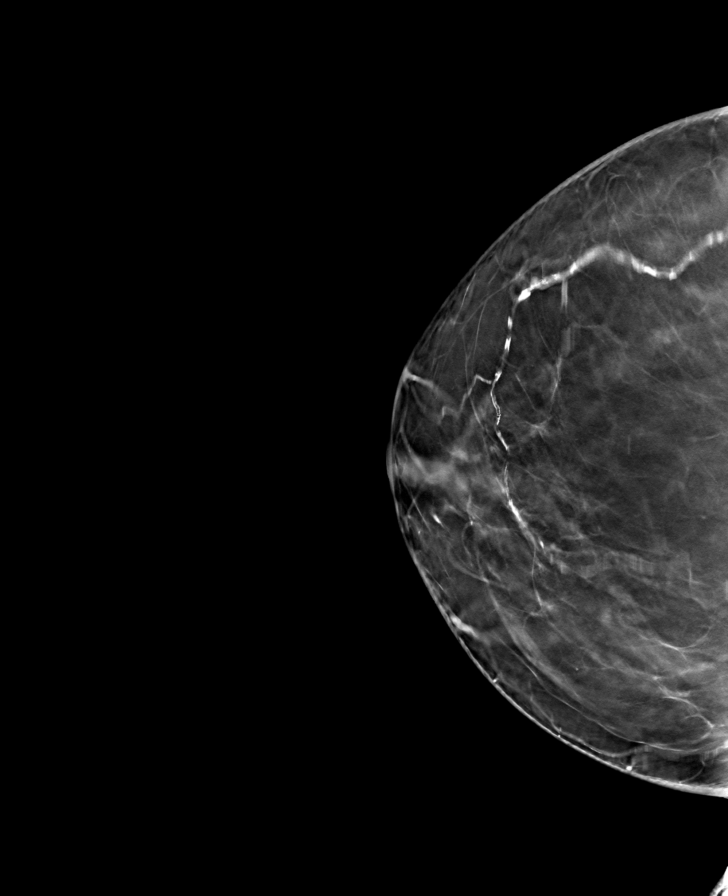

[R MLO tomo · tomo slice 43/86.0]
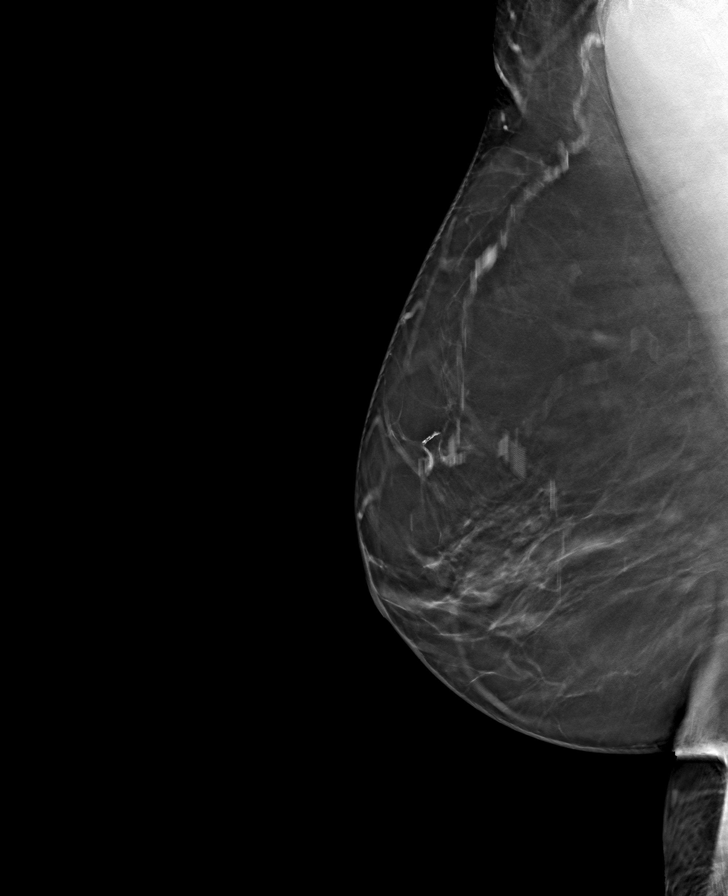

[L CC tomo · tomo slice 37/74.0]
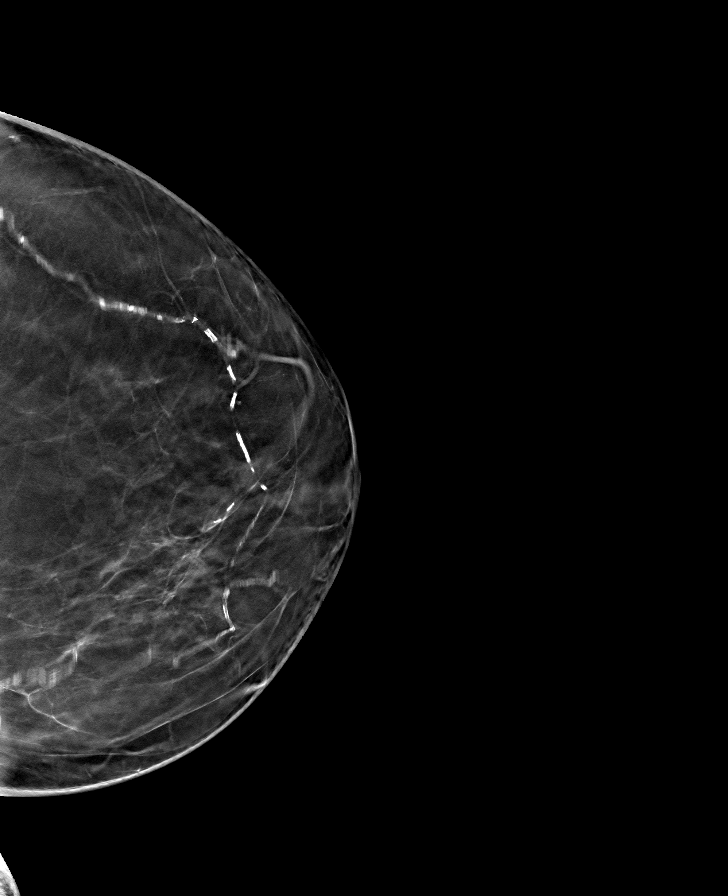

[8 of 24 positions shown; findings below may reference images not displayed]

ACR Breast Density Category b: There are scattered areas of
fibroglandular density.
FINDINGS: There are no findings suspicious for malignancy.
IMPRESSION: No mammographic evidence of malignancy. A result letter of this
screening mammogram will be mailed directly to the patient.

RECOMMENDATION:
Screening mammogram in one year. (Code:51-O-LD2)

BI-RADS CATEGORY  1: Negative.

## 2023-08-22 NOTE — Assessment & Plan Note (Signed)
 Dr Lequita Halt R knee OA On gel inj x3

## 2023-08-22 NOTE — Assessment & Plan Note (Signed)
On diet Use fish oil

## 2023-08-22 NOTE — Assessment & Plan Note (Signed)
Off statins 

## 2023-08-22 NOTE — Assessment & Plan Note (Signed)
Cont on Maxzide 

## 2023-08-22 NOTE — Assessment & Plan Note (Signed)
Repaginade - re-started Check A1c

## 2023-08-22 NOTE — Assessment & Plan Note (Signed)
11/19 Dr Fuller Plan Repeat colon in 3 years

## 2023-08-22 NOTE — Progress Notes (Signed)
 Subjective:  Patient ID: Monica Horn, female    DOB: [birth year]  Age: 78 y.o. MRN: [id number]  CC: Medical Management of Chronic Issues (3 mnth f/u)   HPI Monica Horn presents for DM, HTN, OA  Outpatient Medications Prior to Visit  Medication Sig Dispense Refill   aspirin 81 MG chewable tablet Chew by mouth daily.     celecoxib  (CELEBREX ) 200 MG capsule TAKE 1 CAPSULE EVERY DAY AS NEEDED 90 capsule 3   cetirizine  (ZYRTEC ) 10 MG tablet Take 1 tablet (10 mg total) by mouth daily as needed. 90 tablet 3   cholecalciferol (VITAMIN D3) 10 MCG (400 UNIT) TABS tablet Take 1,000 Units by mouth daily.     fluticasone  (FLONASE ) 50 MCG/ACT nasal spray Use 2 spray(s) in each nostril once daily 16 g 5   furosemide  (LASIX ) 20 MG tablet TAKE 1 TO 2 TABLETS EVERY DAY 180 tablet 3   Multiple Vitamin (MULTIVITAMIN) tablet Take 1 tablet by mouth daily.     NON FORMULARY OTC LION MAIN  Once a day     pantoprazole  (PROTONIX ) 40 MG tablet TAKE 1 TABLET EVERY DAY 90 tablet 3   potassium chloride  SA (KLOR-CON  M) 20 MEQ tablet Take 1 tablet (20 mEq total) by mouth daily. 90 tablet 3   repaglinide  (PRANDIN ) 1 MG tablet Take 1 tablet (1 mg total) by mouth 3 (three) times daily before meals. 270 tablet 3   triamterene -hydrochlorothiazide (MAXZIDE-25) 37.5-25 MG tablet TAKE 1 TABLET EVERY DAY 90 tablet 3   trolamine salicylate (ASPERCREME) 10 % cream Apply 1 application topically 2 (two) times daily as needed for muscle pain.     memantine  (NAMENDA ) 5 MG tablet Take 1 tablet (5 mg total) by mouth 2 (two) times daily. (Patient not taking: Reported on 08/22/2023) 60 tablet 11   No facility-administered medications prior to visit.    ROS: Review of Systems  Constitutional:  Negative for activity change, appetite change, chills, fatigue and unexpected weight change.  HENT:  Negative for congestion, mouth sores and sinus pressure.   Eyes:  Negative for visual disturbance.  Respiratory:  Negative for cough and  chest tightness.   Gastrointestinal:  Negative for abdominal pain and nausea.  Genitourinary:  Negative for difficulty urinating, frequency and vaginal pain.  Musculoskeletal:  Positive for arthralgias and gait problem. Negative for back pain.  Skin:  Negative for pallor and rash.  Neurological:  Negative for dizziness, tremors, weakness, numbness and headaches.  Psychiatric/Behavioral:  Negative for confusion and sleep disturbance. The patient is not nervous/anxious.     Objective:  BP 112/80 (BP Location: Left Arm, Patient Position: Sitting, Cuff Size: Normal)   Pulse 91   Temp 97.7 F (36.5 C) (Oral)   Ht 5' 0.75 (1.543 m)   Wt 168 lb (76.2 kg)   SpO2 95%   BMI 32.01 kg/m   BP Readings from Last 3 Encounters:  08/22/23 112/80  06/27/23 115/78  05/19/23 110/70    Wt Readings from Last 3 Encounters:  08/22/23 168 lb (76.2 kg)  06/27/23 171 lb 6.4 oz (77.7 kg)  05/19/23 165 lb (74.8 kg)    Physical Exam Constitutional:      General: She is not in acute distress.    Appearance: She is well-developed. She is obese.  HENT:     Head: Normocephalic.     Right Ear: External ear normal.     Left Ear: External ear normal.     Nose: Nose normal.  Eyes:  General:        Right eye: No discharge.        Left eye: No discharge.     Conjunctiva/sclera: Conjunctivae normal.     Pupils: Pupils are equal, round, and reactive to light.  Neck:     Thyroid : No thyromegaly.     Vascular: No JVD.     Trachea: No tracheal deviation.  Cardiovascular:     Rate and Rhythm: Normal rate and regular rhythm.     Heart sounds: Normal heart sounds.  Pulmonary:     Effort: No respiratory distress.     Breath sounds: No stridor. No wheezing.  Abdominal:     General: Bowel sounds are normal. There is no distension.     Palpations: Abdomen is soft. There is no mass.     Tenderness: There is no abdominal tenderness. There is no guarding or rebound.  Musculoskeletal:        General: No  tenderness.     Cervical back: Normal range of motion and neck supple. No rigidity.  Lymphadenopathy:     Cervical: No cervical adenopathy.  Skin:    Findings: No erythema or rash.  Neurological:     Cranial Nerves: No cranial nerve deficit.     Motor: No abnormal muscle tone.     Coordination: Coordination normal.     Deep Tendon Reflexes: Reflexes normal.  Psychiatric:        Behavior: Behavior normal.        Thought Content: Thought content normal.        Judgment: Judgment normal.    R knee in a brace   Lab Results  Component Value Date   WBC 7.4 01/06/2022   HGB 13.5 01/06/2022   HCT 43.8 01/06/2022   PLT 315.0 01/06/2022   GLUCOSE 111 (H) 05/19/2023   CHOL 222 (H) 11/16/2022   TRIG 273.0 (H) 11/16/2022   HDL 40.40 11/16/2022   LDLDIRECT 124.0 11/16/2022   LDLCALC 130 (H) 06/24/2017   ALT 28 05/19/2023   AST 26 05/19/2023   NA 135 05/19/2023   K 3.6 05/19/2023   CL 98 05/19/2023   CREATININE 1.37 (H) 05/19/2023   BUN 13 05/19/2023   CO2 31 05/19/2023   TSH 1.61 01/06/2022   INR 0.9 10/13/2018   HGBA1C 6.3 05/19/2023   MICROALBUR <0.7 11/16/2022    DG Bone Density Result Date: 07/05/2023 EXAM: DUAL X-RAY ABSORPTIOMETRY (DXA) FOR BONE MINERAL DENSITY IMPRESSION: Referring Physician:  KARLYNN LULLA NOEL Your patient completed a bone mineral density test using GE Lunar iDXA system (analysis version: 16). Technologist: BEC PATIENT: Name: Monica Horn Patient ID: [id number] Birth Date: 1946-06-25 Height: 60.5 in. Sex: Female Measured: 07/05/2023 Weight: 169.8 lbs. Indications: Advanced Age, Diabetic non insulin, Estrogen Deficient, Left hip replaced, osteoarthritis, Postmenopausal, Protonix , Secondary Osteoporosis Fractures: NONE Treatments: Calcium (E943.0), Vitamin D (E933.5) ASSESSMENT: The BMD measured at Femur Neck is 0.891 g/cm2 with a T-score of -1.1. This patient is considered osteopenic/low bone mass according to World Health Organization [facility]) criteria.  Left hip excluded due to surgical hardware. The quality of the exam is good. Site Region Measured Date Measured Age [age]A BMD Significant CHANGE T-score Right Femur Neck   8/05/2024    76.6         -1.1    0.891 g/cm2 AP Spine    L1-L4  07/05/2023    76.7         -0.5    1.124 g/cm2 World Health Organization [facility])  criteria for post-menopausal, Caucasian Women: Normal       T-score at or above -1 SD Osteopenia   T-score between -1 and -2.5 SD Osteoporosis T-score at or below -2.5 SD RECOMMENDATION: 1. All patients should optimize calcium and vitamin D intake. 2. Consider FDA-approved medical therapies in postmenopausal women and men aged 86 years and older, based on the following: a. A hip or vertebral (clinical or morphometric) fracture. b. T-score = -2.5 at the femoral neck or spine after appropriate evaluation to exclude secondary causes. c. Low bone mass (T-score between -1.0 and -2.5 at the femoral neck or spine) and a 10-year probability of a hip fracture = 3% or a 10-year probability of a major osteoporosis-related fracture = 20% based on the [place] -adapted WHO algorithm. d. Clinician judgment and/or patient preferences may indicate treatment for people with 10-year fracture probabilities above or below these levels. FOLLOW-UP: Patients with diagnosis of osteoporosis or at high risk for fracture should have regular bone mineral density tests.? Patients eligible for Medicare are allowed routine testing every 2 years.? The testing frequency can be increased to one year for patients who have rapidly progressing disease, are receiving or discontinuing medical therapy to restore bone mass, or have additional risk factors. I have reviewed this study and agree with the findings. [facility] FRAX* 10-year Probability of Fracture Based on femoral neck BMD: Femur (Right) Major Osteoporotic Fracture: 4.5% Hip Fracture:                0.7% Population:                  [place]  (Black) Risk Factors:                 Secondary Osteoporosis *FRAX is a armed forces logistics/support/administrative officer of the [employer] for Metabolic Bone Disease, a World Science Writer (WHO) [employer]. ASSESSMENT: The probability of a major osteoporotic fracture is 4.5% within the next ten years. The probability of a hip fracture is 0.7% within the next ten years. Electronically Signed   By: Norman Hopper M.D.   On: 07/05/2023 08:57    Assessment & Plan:   Problem List Items Addressed This Visit     Type II diabetes mellitus - Primary   Repaginade - re-started Check A1c      Relevant Orders   Comprehensive metabolic panel   Hemoglobin A1c   Dyslipidemia   On diet Use fish oil      HTN (hypertension)   Cont on Maxzide      Colon polyps   11/19 Dr Aneita Repeat colon in 3 years      Statin intolerance   Off statins      Knee pain, chronic   Dr Melodi R knee OA On gel inj x3         No orders of the defined types were placed in this encounter.     Follow-up: Return in about 3 months (around 11/20/2023) for a follow-up visit.  Marolyn Noel, MD

## 2023-09-29 DIAGNOSIS — M1711 Unilateral primary osteoarthritis, right knee: Secondary | ICD-10-CM | POA: Diagnosis not present

## 2023-10-01 ENCOUNTER — Encounter: Payer: Self-pay | Admitting: Internal Medicine

## 2023-11-29 ENCOUNTER — Ambulatory Visit: Payer: Medicare (Managed Care) | Admitting: Internal Medicine

## 2023-12-12 ENCOUNTER — Other Ambulatory Visit (INDEPENDENT_AMBULATORY_CARE_PROVIDER_SITE_OTHER)

## 2023-12-12 ENCOUNTER — Encounter: Payer: Self-pay | Admitting: Physician Assistant

## 2023-12-12 ENCOUNTER — Ambulatory Visit: Payer: Medicare (Managed Care) | Admitting: Physician Assistant

## 2023-12-12 VITALS — BP 124/78 | HR 98 | Ht 61.0 in | Wt 166.4 lb

## 2023-12-12 DIAGNOSIS — K625 Hemorrhage of anus and rectum: Secondary | ICD-10-CM

## 2023-12-12 DIAGNOSIS — M1711 Unilateral primary osteoarthritis, right knee: Secondary | ICD-10-CM | POA: Diagnosis not present

## 2023-12-12 DIAGNOSIS — K5900 Constipation, unspecified: Secondary | ICD-10-CM | POA: Diagnosis not present

## 2023-12-12 DIAGNOSIS — Z862 Personal history of diseases of the blood and blood-forming organs and certain disorders involving the immune mechanism: Secondary | ICD-10-CM

## 2023-12-12 DIAGNOSIS — Z8619 Personal history of other infectious and parasitic diseases: Secondary | ICD-10-CM

## 2023-12-12 DIAGNOSIS — K219 Gastro-esophageal reflux disease without esophagitis: Secondary | ICD-10-CM | POA: Diagnosis not present

## 2023-12-12 DIAGNOSIS — Z860101 Personal history of adenomatous and serrated colon polyps: Secondary | ICD-10-CM

## 2023-12-12 LAB — CBC WITH DIFFERENTIAL/PLATELET
Basophils Absolute: 0 10*3/uL (ref 0.0–0.1)
Basophils Relative: 0.4 % (ref 0.0–3.0)
Eosinophils Absolute: 0.1 10*3/uL (ref 0.0–0.7)
Eosinophils Relative: 1.6 % (ref 0.0–5.0)
HCT: 41.3 % (ref 36.0–46.0)
Hemoglobin: 12.9 g/dL (ref 12.0–15.0)
Lymphocytes Relative: 22.7 % (ref 12.0–46.0)
Lymphs Abs: 1.3 10*3/uL (ref 0.7–4.0)
MCHC: 31.4 g/dL (ref 30.0–36.0)
MCV: 76.3 fl — ABNORMAL LOW (ref 78.0–100.0)
Monocytes Absolute: 0.6 10*3/uL (ref 0.1–1.0)
Monocytes Relative: 9.6 % (ref 3.0–12.0)
Neutro Abs: 3.8 10*3/uL (ref 1.4–7.7)
Neutrophils Relative %: 65.7 % (ref 43.0–77.0)
Platelets: 337 10*3/uL (ref 150.0–400.0)
RBC: 5.41 Mil/uL — ABNORMAL HIGH (ref 3.87–5.11)
RDW: 14 % (ref 11.5–15.5)
WBC: 5.9 10*3/uL (ref 4.0–10.5)

## 2023-12-12 LAB — IBC + FERRITIN
Ferritin: 124.4 ng/mL (ref 10.0–291.0)
Iron: 97 ug/dL (ref 42–145)
Saturation Ratios: 25.9 % (ref 20.0–50.0)
TIBC: 375.2 ug/dL (ref 250.0–450.0)
Transferrin: 268 mg/dL (ref 212.0–360.0)

## 2023-12-12 LAB — COMPREHENSIVE METABOLIC PANEL WITH GFR
ALT: 23 U/L (ref 0–35)
AST: 18 U/L (ref 0–37)
Albumin: 4.4 g/dL (ref 3.5–5.2)
Alkaline Phosphatase: 111 U/L (ref 39–117)
BUN: 18 mg/dL (ref 6–23)
CO2: 32 meq/L (ref 19–32)
Calcium: 10 mg/dL (ref 8.4–10.5)
Chloride: 101 meq/L (ref 96–112)
Creatinine, Ser: 1.28 mg/dL — ABNORMAL HIGH (ref 0.40–1.20)
GFR: 40.54 mL/min — ABNORMAL LOW (ref >60.00)
Glucose, Bld: 128 mg/dL — ABNORMAL HIGH (ref 70–99)
Potassium: 3.1 meq/L — ABNORMAL LOW (ref 3.5–5.1)
Sodium: 140 meq/L (ref 135–145)
Total Bilirubin: 0.6 mg/dL (ref 0.2–1.2)
Total Protein: 7.8 g/dL (ref 6.0–8.3)

## 2023-12-12 MED ORDER — HYDROCORTISONE ACETATE 25 MG RE SUPP: 25.0000 mg | Freq: Two times a day (BID) | RECTAL | 0 refills | Status: AC

## 2023-12-12 MED ORDER — NA SULFATE-K SULFATE-MG SULF 17.5-3.13-1.6 GM/177ML PO SOLN: 1.0000 | Freq: Once | ORAL | 0 refills | Status: AC

## 2023-12-12 MED ORDER — POLYETHYLENE GLYCOL 3350 17 G PO PACK: 17.0000 g | PACK | Freq: Every day | ORAL | Status: AC | PRN

## 2023-12-12 NOTE — Progress Notes (Addendum)
 12/12/2023 Monica Horn [id number] [birth year]  Referring provider: Genia Kettering, MD Primary GI doctor: Dr. Yvone Herd ( Dr. Sandrea Cruel)  ASSESSMENT AND PLAN:   Rectal bleeding  BRB on TP, has been better since she stopped the celebrex  No rectal pain Negative FOBT in the office -Sitz baths, increase fiber, increase water  -Hydrocortisone supp given and external cream sent in.  -We discussed hemorrhoid banding here in the office for internal hemorrhoids if not improving with conservative treatment. - We discussed surgical treatment if external hemorrhoids do not improve with conservative treatment. - follow up for evaluation here in the office.  Schedule colonoscopy at [facility] with EGD, We have discussed the risks of bleeding, infection, perforation, medication reactions, and remote risk of death associated with colonoscopy. All questions were answered and the patient acknowledges these risk and wishes to proceed.  Constipation BM 2-3 x a week - Increase fiber/ water  intake, decrease caffeine, increase activity level. -continue Miralax  daily, has had improvement, given information -possible component of pelvis floor dysfunction, consider pelvic PT  Personal history of tubular adenomatous polyps 11/04/2020 colonoscopy for rectal bleeding 3 polyps 7 to 8 mm sigmoid cecum, 3 mm polyp ascending colon, tattooed hepatic flexure post polypectomy scar no evidence of residual polyp, internal hemorrhoids recall 11/05/2023  GERD without esophagitis/history of H. pylori gastritis 2019 06/06/2018 EGD for IDA normal esophagus, single gastric polyp MR placed, gastritis, normal duodenum, found to have H. pylori treated with Pylera I do not see eradication study for H. pylori On pantoprazole  40 mg daily, well controlled GERD, occ dysphagia with potassium pill only, no nausea, vomiting, melena With dysphagia, GERD, AB pain, she prefers to have EGD with colon - continue pantoprazole  -I discussed risks  of EGD with patient today, including risk of sedation, bleeding or perforation. Patient provides understanding and gave verbal consent to proceed. - check for H pylori on EGD, treat if positive  History of IDA October 2019 01/06/2022  HGB 13.5 MCV 75.9 Platelets 315.0 07/22/2022 Iron 80 Ferritin 103.0 B12 320 Felt secondary to large benign gastric polyp/H pylori She is not on iron, she is on a B12 Will check on iron, ferritin  I have reviewed the clinic note as outlined by Santina Cull, PA and agree with the assessment, plan and medical decision making.  Ms. Zyskowski presents for evaluation of abdominal pain, GERD, rectal bleeding and colon polyps.  She is overdue for surveillance colonoscopy -has a history of TVA with HGD and 3 tubular adenomas on last colonoscopy in 2022.  Reasonable to proceed with colonoscopy.  She is also endorsing abdominal pain associated with GERD.  Has a history of previous H. pylori infection without eradication.  In light of the symptoms will proceed with EGD at the same time as colonoscopy.  CBC and iron studies appear reassuring at this time.  Eugenia Hess, MD   Patient Care Team: Plotnikov, Oakley Bellman, MD as PCP - General Sandrea Cruel Ellyn Hack, MD (Inactive) (Gastroenterology) Corie Diamond, MD as Consulting Physician (Ophthalmology) Celia Coles Angus Kenning, DPM as Consulting Physician (Podiatry) Liliane Rei, MD as Consulting Physician (Orthopedic Surgery) Aminta Kales, MD as Consulting Physician (Ophthalmology) Alane Allen Sara E, PA-C (Neurology)  HISTORY OF PRESENT ILLNESS: 78 y.o. female with a past medical history listed below presents for evaluation of rectal bleeding.   Discussed the use of AI scribe software for clinical note transcription with the patient, who gave verbal consent to proceed.  History of Present Illness   Monica Horn is  a 78 year old female with a history of tubular adenomatous polyps who presents with rectal bleeding.  Rectal bleeding  occurs after bowel movements and is not mixed with the stool. She associates the bleeding with her use of Celebrex , which she discontinued in February 2025 due to difficulty obtaining an appointment. Since stopping Celebrex , the bleeding has decreased. She has regular bowel movements, attributed to daily Miralax  and a fiber-rich diet, including cornflakes, with bowel movements occurring two to three times a week.  She has a history of tubular adenomatous polyps, with the last colonoscopy in March 2022 revealing several polyps and internal hemorrhoids. She is due for her next colonoscopy in March 2025. She also has a history of reflux, which is well-controlled with medication, and denies current symptoms of heartburn or dysphagia, except for difficulty with a large potassium pill.  In 2019, she was treated for a bacterial stomach infection identified during an endoscopy and colonoscopy performed due to anemia. She was treated with antibiotics but does not recall undergoing an eradication study post-treatment. She currently takes B12 supplements but not iron, as iron supplements cause constipation.  She experiences occasional sharp abdominal pain that is brief and not localized. No nausea, vomiting, or persistent abdominal pain. She participates in exercise four days a week and recently celebrated her sixtieth anniversary, which involved increased eating.      She  reports that she quit smoking about 46 years ago. Her smoking use included cigarettes. She has never used smokeless tobacco. She reports that she does not currently use alcohol. She reports that she does not use drugs.  RELEVANT GI HISTORY, IMAGING AND LABS: Results   LABS Iron: Within normal limits (07/2022)  DIAGNOSTIC Colonoscopy: Several polyps, internal hemorrhoids (10/2020) Rectal exam: Left posterior prolapsed internal hemorrhoid, no blood (12/12/2023)      CBC    Component Value Date/Time   WBC 7.4 01/06/2022 1411   RBC 5.77  (H) 01/06/2022 1411   HGB 13.5 01/06/2022 1411   HCT 43.8 01/06/2022 1411   PLT 315.0 01/06/2022 1411   MCV 75.9 (L) 01/06/2022 1411   MCH 23.3 (L) 10/19/2018 0506   MCHC 30.8 01/06/2022 1411   RDW 14.0 01/06/2022 1411   LYMPHSABS 2.3 01/06/2022 1411   MONOABS 0.9 01/06/2022 1411   EOSABS 0.1 01/06/2022 1411   BASOSABS 0.1 01/06/2022 1411   No results for input(s): "HGB" in the last 8760 hours.  CMP     Component Value Date/Time   NA 135 05/19/2023 1517   K 3.6 05/19/2023 1517   CL 98 05/19/2023 1517   CO2 31 05/19/2023 1517   GLUCOSE 111 (H) 05/19/2023 1517   BUN 13 05/19/2023 1517   CREATININE 1.37 (H) 05/19/2023 1517   CREATININE 0.99 (H) 04/24/2020 1154   CALCIUM 10.6 (H) 05/19/2023 1517   PROT 8.6 (H) 05/19/2023 1517   ALBUMIN 4.8 05/19/2023 1517   AST 26 05/19/2023 1517   ALT 28 05/19/2023 1517   ALKPHOS 104 05/19/2023 1517   BILITOT 1.0 05/19/2023 1517   GFRNONAA 57 (L) 04/24/2020 1154   GFRAA 66 04/24/2020 1154      Latest Ref Rng & Units 05/19/2023    3:17 PM 11/16/2022    3:30 PM 05/12/2022    3:39 PM  Hepatic Function  Total Protein 6.0 - 8.3 g/dL 8.6  7.7  8.2   Albumin 3.5 - 5.2 g/dL 4.8  4.4  4.5   AST 0 - 37 U/L 26  22  22  ALT 0 - 35 U/L 28  29  29    Alk Phosphatase 39 - 117 U/L 104  102  104   Total Bilirubin 0.2 - 1.2 mg/dL 1.0  0.5  0.5       Current Medications:   Current Outpatient Medications (Endocrine & Metabolic):    repaglinide  (PRANDIN ) 1 MG tablet, Take 1 tablet (1 mg total) by mouth 3 (three) times daily before meals.  Current Outpatient Medications (Cardiovascular):    furosemide  (LASIX ) 20 MG tablet, TAKE 1 TO 2 TABLETS EVERY DAY   triamterene -hydrochlorothiazide (MAXZIDE-25) 37.5-25 MG tablet, TAKE 1 TABLET EVERY DAY  Current Outpatient Medications (Respiratory):    cetirizine  (ZYRTEC ) 10 MG tablet, Take 1 tablet (10 mg total) by mouth daily as needed.   fluticasone  (FLONASE ) 50 MCG/ACT nasal spray, Use 2 spray(s) in each  nostril once daily  Current Outpatient Medications (Analgesics):    aspirin 81 MG chewable tablet, Chew by mouth daily.   celecoxib  (CELEBREX ) 200 MG capsule, TAKE 1 CAPSULE EVERY DAY AS NEEDED (Patient not taking: Reported on 12/12/2023)   Current Outpatient Medications (Other):    cholecalciferol (VITAMIN D3) 10 MCG (400 UNIT) TABS tablet, Take 1,000 Units by mouth daily.   hydrocortisone (ANUSOL-HC) 25 MG suppository, Place 1 suppository (25 mg total) rectally 2 (two) times daily.   Multiple Vitamin (MULTIVITAMIN) tablet, Take 1 tablet by mouth daily.   Na Sulfate-K Sulfate-Mg Sulfate concentrate (SUPREP) 17.5-3.13-1.6 GM/177ML SOLN, Take 1 kit (354 mLs total) by mouth once for 1 dose.   NON FORMULARY, OTC LION MAIN  Once a day   pantoprazole  (PROTONIX ) 40 MG tablet, TAKE 1 TABLET EVERY DAY   polyethylene glycol (MIRALAX ) 17 g packet, Take 17 g by mouth daily as needed.   potassium chloride  SA (KLOR-CON  M) 20 MEQ tablet, Take 1 tablet (20 mEq total) by mouth daily.   trolamine salicylate (ASPERCREME) 10 % cream, Apply 1 application topically 2 (two) times daily as needed for muscle pain.   memantine  (NAMENDA ) 5 MG tablet, Take 1 tablet (5 mg total) by mouth 2 (two) times daily. (Patient not taking: Reported on 06/27/2023)  Medical History:  Past Medical History:  Diagnosis Date   Abnormal EKG 2014   NONSPECIFIC ST DEPRESSION + T ANORMALITY, WORKED UP 2014 DR Jalene Mayor AND RELEASED BY DR Carolynne Citron   Acute sinusitis 07/01/2021   11/22, 2/23 Omnicef   Augmentin  bid x 10 d   Acute tonsillitis 08/05/2008   Allergic rhinitis 11/11/2015   3//17 Zyrtec   Use Sinus rinse - given   Anemia, unspecified 10/27/2007   Ankle swelling 04/23/2019   Blood in stool 10/27/2007   Recurrent   Chronic   Dr Sandrea Cruel  Colon is due this fall 2019   Blood transfusion without reported diagnosis    Carpal tunnel syndrome 10/27/2007   R>L  She had L wrist operated on      Cerumen impaction 12/01/2009   B wax       Chest pain, atypical 08/26/2012   Chronic knee pain after total replacement of right knee joint 04/21/2022   Closed undisplaced fracture of nasal bone 12/09/2016   Colon polyps    Cystitis 09/08/2009   Dizziness 04/04/2009   Dog bite 09/27/2022   Face 09/2022  S/p Rabies shots   Dyslipidemia 06/20/2007   Chronic   Statin intolerant  On diet  Use fish oil   Eruption cyst 04/05/2014   Fatty infiltration of liver    Finger contusion 11/30/2010   R  4th   Foot pain 06/13/2012   2020 L dorsal pain - OA/capsulitis  2021 L ankle pain since the Fall of 2020 - saw dr Celia Coles, was wearing a boot in may 2021  "Inflammatory tendinitis left posterior tib along with flatfoot deformity which is contributory towards the problem"  6/21 Treat edema of LEs  Medrol  pac  Sports med ref if not better   Forehead contusion 11/19/2016   C/o hitting a cabinet after chocking on ice - fell; ?passed out at the client's house November 04 2016; had a hematoma on the central forehead. She had a HA x1d. She had a black eye later. No n/v.  Nose bones X ray IMPRESSION:  Depressed anterior nasal bone fractures bilaterally.     Mild swelling of the nasal septal soft tissues.        Electronically Signed    By: Levell Reach M.D.    On: 04/12/201   Ganglion cyst 11/11/2015   L wrist tender 1 cm ganglion 3/17  Dr Aloha Arnold  ?L foot 2020   Gastric polyp 07/18/2018   GERD (gastroesophageal reflux disease)    Hematochezia 10/27/2007   Hemorrhage of gastrointestinal tract 10/27/2007   Hemorrhoids 10/27/2007   History of COVID-19 09/01/2019   History of revision of total replacement of left hip joint 11/17/2018   HTN (hypertension) 07/11/2007   Chronic - on Norvasc  - d/c (swelling)  D/c Benazepril  due to cough 6/16 - restarted 8/16  D/c Losartan  due to HA  9/20 - Cont on Maxzide   Hydradenitis 01/2009   Right S aureus   Ingrown right big toenail 09/27/2014   Near syncope 10/13/2012   2/14  Abn ECHO   OA (osteoarthritis) of hip  10/18/2018   Osteoarthritis of right knee 04/25/2022   Otitis externa 07/27/2010   2020  Corisporin      Rash and nonspecific skin eruption 04/05/2014   8/15 recurrent - R inner thigh  ?herpes - pt decline Rx   Shoulder pain, right 11/30/2010   R rot cuff strain, repeat 2023  Arnica gel or cream on bruises     S/p PT at [facility]  Blue-Emu cream was recommended  2-3 times a day   Sinus infection 10/02/2018   Statin intolerance 07/29/2020   Tennis elbow 01/13/2021   5/22 L  Ice, Voltaren gel, massage   Tubulovillous adenoma polyp of colon 03/2007   w/ focal high grade dysplasia, no carcinoma   Type II diabetes mellitus 06/12/2007   Diet controlled  CT ca scoring info given 12/19, advised     Not taking Repaginate - re-start  Risks associated with treatment noncompliance were discussed. Compliance was encouraged.  Re-start Rybelsus  - use Assistance program, if covered - too $$$   URI, acute 06/11/2011   10/12, 10/13, 4/15, 11/15, 3/16, 6/16, 2/20, 1/21   UTI (urinary tract infection) 01/04/2014   Allergies:  Allergies  Allergen Reactions   Amlodipine      Leg swelling   Ibuprofen      gastritis   Benazepril  Cough   Losartan  Other (See Comments)    Headache   Lovastatin      myalgia     Surgical History:  She  has a past surgical history that includes Polypectomy; Tubal ligation; Carpal tunnel release; Colonoscopy; and Total hip arthroplasty (Left, 10/18/2018). Family History:  Her family history includes Alcohol abuse in her father; Cancer in her father; Hypertension in her mother and another family member; Lung cancer in her mother; Stroke in  her mother and another family member.  REVIEW OF SYSTEMS  : All other systems reviewed and negative except where noted in the History of Present Illness.  PHYSICAL EXAM: BP 124/78   Pulse 98   Ht 5\' 1"  (1.549 m)   Wt 166 lb 6 oz (75.5 kg)   SpO2 99%   BMI 31.44 kg/m  Physical Exam   GENERAL APPEARANCE: Well nourished, in no apparent  distress. HEENT: No cervical lymphadenopathy, unremarkable thyroid , sclerae anicteric, conjunctiva pink. RESPIRATORY: Respiratory effort normal, breath sounds clear to auscultation bilaterally without rales, rhonchi, or wheezing. CARDIO: Regular rate and rhythm with a holosystolic murmur present, peripheral pulses intact. ABDOMEN: Soft, non-distended, active bowel sounds in all four quadrants, no tenderness to palpation, no rebound, no mass appreciated. RECTAL: Prolapsed internal hemorrhoid noted, no visible blood on rectal exam, negative FOBT. MUSCULOSKELETAL: Full range of motion, normal gait, without edema. SKIN: Dry, intact without rashes or lesions. No jaundice. NEURO: Alert, oriented, no focal deficits. PSYCH: Cooperative, normal mood and affect.      Edmonia Gottron, PA-C 3:44 PM

## 2023-12-12 NOTE — Patient Instructions (Addendum)
 You have been scheduled for an endoscopy and colonoscopy with Dr. Yvone Herd on 01-10-24. Please follow the written instructions given to you at your visit today.  If you use inhalers (even only as needed), please bring them with you on the day of your procedure.  DO NOT TAKE 7 DAYS PRIOR TO TEST- Trulicity (dulaglutide) Ozempic, Wegovy (semaglutide ) Mounjaro (tirzepatide) Bydureon Bcise (exanatide extended release)  DO NOT TAKE 1 DAY PRIOR TO YOUR TEST Rybelsus  (semaglutide ) Adlyxin (lixisenatide) Victoza (liraglutide) Byetta (exanatide) ___________________________________________________________________________   Your provider has requested that you go to the basement level for lab work before leaving today. Press "B" on the elevator. The lab is located at the first door on the left as you exit the elevator.  Miralax  is an osmotic laxative.  It only brings more water  into the stool.  This is safe to take daily.  Can take up to 17 gram of miralax  twice a day.  Mix with juice or coffee.  Start 1 capful at night for 3-4 days and reassess your response in 3-4 days.  You can increase and decrease the dose based on your response.  Remember, it can take up to 3-4 days to take effect OR for the effects to wear off.   I often pair this with benefiber in the morning to help assure the stool is not too loose.   Toileting tips to help with your constipation - Drink at least 64-80 ounces of water /liquid per day. - Establish a time to try to move your bowels every day.  For many people, this is after a cup of coffee or after a meal such as breakfast. - Sit all of the way back on the toilet keeping your back fairly straight and while sitting up, try to rest the tops of your forearms on your upper thighs.   - Raising your feet with a step stool/squatty potty can be helpful to improve the angle that allows your stool to pass through the rectum. - Relax the rectum feeling it bulge toward the toilet  water .  If you feel your rectum raising toward your body, you are contracting rather than relaxing. - Breathe in and slowly exhale. "Belly breath" by expanding your belly towards your belly button. Keep belly expanded as you gently direct pressure down and back to the anus.  A low pitched GRRR sound can assist with increasing intra-abdominal pressure.  (Can also trying to blow on a pinwheel and make it move, this helps with the same belly breathing) - Repeat 3-4 times. If unsuccessful, contract the pelvic floor to restore normal tone and get off the toilet.  Avoid excessive straining. - To reduce excessive wiping by teaching your anus to normally contract, place hands on outer aspect of knees and resist knee movement outward.  Hold 5-10 second then place hands just inside of knees and resist inward movement of knees.  Hold 5 seconds.  Repeat a few times each way.  Go to the ER if unable to pass gas, severe AB pain, unable to hold down food, any shortness of breath of chest pain.  Please do sitz baths- these can be found at the pharmacy. It is a Chief Operating Officer that is put in your toliet.  Please increase fiber or add benefiber, increase water  and increase acitivity.  Will send in hydrocoritsone suppository, cheapest with GOODRX from [facility], [facility], [employer] or [facility] if your insurance does not pay for it. If the hemorrhoid suppository sent in is too expensive you can  do this over the counter trick.  Apply a pea size amount of generic prescription Anusol HC cream that has been sent into your pharmacy to the tip of an over the counter PrepH suppository and insert rectally once every night for at least 7 nights.  If this does not improve there are procedures that can be done.   About Hemorrhoids  Hemorrhoids are swollen veins in the lower rectum and anus.  Also called piles, hemorrhoids are a common problem.  Hemorrhoids may be internal (inside the rectum) or external (around the  anus).  Internal Hemorrhoids  Internal hemorrhoids are often painless, but they rarely cause bleeding.  The internal veins may stretch and fall down (prolapse) through the anus to the outside of the body.  The veins may then become irritated and painful.  External Hemorrhoids  External hemorrhoids can be easily seen or felt around the anal opening.  They are under the skin around the anus.  When the swollen veins are scratched or broken by straining, rubbing or wiping they sometimes bleed.  How Hemorrhoids Occur  Veins in the rectum and around the anus tend to swell under pressure.  Hemorrhoids can result from increased pressure in the veins of your anus or rectum.  Some sources of pressure are:  Straining to have a bowel movement because of constipation Waiting too long to have a bowel movement Coughing and sneezing often Sitting for extended periods of time, including on the toilet Diarrhea Obesity Trauma or injury to the anus Some liver diseases Stress Family history of hemorrhoids Pregnancy  Pregnant women should try to avoid becoming constipated, because they are more likely to have hemorrhoids during pregnancy.  In the last trimester of pregnancy, the enlarged uterus may press on blood vessels and causes hemorrhoids.  In addition, the strain of childbirth sometimes causes hemorrhoids after the birth.  Symptoms of Hemorrhoids  Some symptoms of hemorrhoids include: Swelling and/or a tender lump around the anus Itching, mild burning and bleeding around the anus Painful bowel movements with or without constipation Bright red blood covering the stool, on toilet paper or in the toilet bowel.   Symptoms usually go away within a few days.  Always talk to your doctor about any bleeding to make sure it is not from some other causes.  Diagnosing and Treating Hemorrhoids  Diagnosis is made by an examination by your healthcare provider.  Special test can be performed by your doctor.     Most cases of hemorrhoids can be treated with: High-fiber diet: Eat more high-fiber foods, which help prevent constipation.  Ask for more detailed fiber information on types and sources of fiber from your healthcare provider. Fluids: Drink plenty of water .  This helps soften bowel movements so they are easier to pass. Sitz baths and cold packs: Sitting in lukewarm water  two or three times a day for 15 minutes cleases the anal area and may relieve discomfort.  If the water  is too hot, swelling around the anus will get worse.  Placing a cloth-covered ice pack on the anus for ten minutes four times a day can also help reduce selling.  Gently pushing a prolapsed hemorrhoid back inside after the bath or ice pack can be helpful. Medications: For mild discomfort, your healthcare provider may suggest over-the-counter pain medication or prescribe a cream or ointment for topical use.  The cream may contain witch hazel, zinc oxide or petroleum jelly.  Medicated suppositories are also a treatment option.  Always consult your doctor  before applying medications or creams. Procedures and surgeries: There are also a number of procedures and surgeries to shrink or remove hemorrhoids in more serious cases.  Talk to your physician about these options.  You can often prevent hemorrhoids or keep them from becoming worse by maintaining a healthy lifestyle.  Eat a fiber-rich diet of fruits, vegetables and whole grains.  Also, drink plenty of water  and exercise regularly.   2007, [employer] Doc.30

## 2023-12-13 ENCOUNTER — Ambulatory Visit (INDEPENDENT_AMBULATORY_CARE_PROVIDER_SITE_OTHER): Admitting: Internal Medicine

## 2023-12-13 ENCOUNTER — Encounter: Payer: Self-pay | Admitting: Internal Medicine

## 2023-12-13 VITALS — BP 110/72 | HR 87 | Temp 97.7°F | Ht 61.0 in | Wt 167.4 lb

## 2023-12-13 DIAGNOSIS — E119 Type 2 diabetes mellitus without complications: Secondary | ICD-10-CM | POA: Diagnosis not present

## 2023-12-13 DIAGNOSIS — M1711 Unilateral primary osteoarthritis, right knee: Secondary | ICD-10-CM | POA: Diagnosis not present

## 2023-12-13 DIAGNOSIS — K921 Melena: Secondary | ICD-10-CM

## 2023-12-13 DIAGNOSIS — I10 Essential (primary) hypertension: Secondary | ICD-10-CM

## 2023-12-13 DIAGNOSIS — D509 Iron deficiency anemia, unspecified: Secondary | ICD-10-CM

## 2023-12-13 NOTE — Assessment & Plan Note (Signed)
Cont on Maxzide 

## 2023-12-13 NOTE — Assessment & Plan Note (Signed)
 Repaginade Check A1c

## 2023-12-13 NOTE — Assessment & Plan Note (Addendum)
 Monica Horn is getting  colonoscopy/endoscopy done in May Off Celebrex 

## 2023-12-13 NOTE — Assessment & Plan Note (Signed)
 Eat Iron rich foods Labs discussed

## 2023-12-13 NOTE — Progress Notes (Signed)
 Subjective:  Patient ID: Monica Horn, female    DOB: 01/25/1946  Age: 78 y.o. MRN: [id number]  CC: Medical Management of Chronic Issues (3 Month follow up. Wanted to make provider aware they will be getting a knee replacement surgery (right) and getting their colonoscopy/endoscopy)   HPI Monica Horn presents for DM, HTN, OA, GERD, rectal bleed  Outpatient Medications Prior to Visit  Medication Sig Dispense Refill   aspirin 81 MG chewable tablet Chew by mouth daily.     cetirizine  (ZYRTEC ) 10 MG tablet Take 1 tablet (10 mg total) by mouth daily as needed. 90 tablet 3   cholecalciferol (VITAMIN D3) 10 MCG (400 UNIT) TABS tablet Take 1,000 Units by mouth daily.     fluticasone  (FLONASE ) 50 MCG/ACT nasal spray Use 2 spray(s) in each nostril once daily 16 g 5   furosemide  (LASIX ) 20 MG tablet TAKE 1 TO 2 TABLETS EVERY DAY 180 tablet 3   hydrocortisone (ANUSOL-HC) 25 MG suppository Place 1 suppository (25 mg total) rectally 2 (two) times daily. 12 suppository 0   Multiple Vitamin (MULTIVITAMIN) tablet Take 1 tablet by mouth daily.     NON FORMULARY OTC LION MAIN  Once a day     pantoprazole  (PROTONIX ) 40 MG tablet TAKE 1 TABLET EVERY DAY 90 tablet 3   polyethylene glycol (MIRALAX ) 17 g packet Take 17 g by mouth daily as needed.     potassium chloride  SA (KLOR-CON  M) 20 MEQ tablet Take 1 tablet (20 mEq total) by mouth daily. 90 tablet 3   repaglinide  (PRANDIN ) 1 MG tablet Take 1 tablet (1 mg total) by mouth 3 (three) times daily before meals. 270 tablet 3   triamterene -hydrochlorothiazide (MAXZIDE-25) 37.5-25 MG tablet TAKE 1 TABLET EVERY DAY 90 tablet 3   trolamine salicylate (ASPERCREME) 10 % cream Apply 1 application topically 2 (two) times daily as needed for muscle pain.     celecoxib  (CELEBREX ) 200 MG capsule TAKE 1 CAPSULE EVERY DAY AS NEEDED (Patient not taking: Reported on 12/13/2023) 90 capsule 3   memantine  (NAMENDA ) 5 MG tablet Take 1 tablet (5 mg total) by mouth 2 (two) times  daily. (Patient not taking: Reported on 12/13/2023) 60 tablet 11   No facility-administered medications prior to visit.    ROS: Review of Systems  Constitutional:  Positive for fatigue. Negative for activity change, appetite change, chills and unexpected weight change.  HENT:  Negative for congestion, mouth sores and sinus pressure.   Eyes:  Negative for visual disturbance.  Respiratory:  Negative for cough and chest tightness.   Gastrointestinal:  Negative for abdominal pain and nausea.  Genitourinary:  Negative for difficulty urinating, frequency and vaginal pain.  Musculoskeletal:  Positive for arthralgias and gait problem. Negative for back pain.  Skin:  Negative for pallor and rash.  Neurological:  Negative for dizziness, tremors, weakness, numbness and headaches.  Hematological:  Does not bruise/bleed easily.  Psychiatric/Behavioral:  Negative for confusion and sleep disturbance.     Objective:  BP 110/72   Pulse 87   Temp 97.7 F (36.5 C)   Ht 5\' 1"  (1.549 m)   Wt 167 lb 6.4 oz (75.9 kg)   SpO2 98%   BMI 31.63 kg/m   BP Readings from Last 3 Encounters:  12/13/23 110/72  12/12/23 124/78  08/22/23 112/80    Wt Readings from Last 3 Encounters:  12/13/23 167 lb 6.4 oz (75.9 kg)  12/12/23 166 lb 6 oz (75.5 kg)  08/22/23 168 lb (76.2  kg)    Physical Exam Constitutional:      General: She is not in acute distress.    Appearance: She is well-developed. She is obese.  HENT:     Head: Normocephalic.     Right Ear: External ear normal.     Left Ear: External ear normal.     Nose: Nose normal.  Eyes:     General:        Right eye: No discharge.        Left eye: No discharge.     Conjunctiva/sclera: Conjunctivae normal.     Pupils: Pupils are equal, round, and reactive to light.  Neck:     Thyroid : No thyromegaly.     Vascular: No JVD.     Trachea: No tracheal deviation.  Cardiovascular:     Rate and Rhythm: Normal rate and regular rhythm.     Heart sounds:  Normal heart sounds.  Pulmonary:     Effort: No respiratory distress.     Breath sounds: No stridor. No wheezing.  Abdominal:     General: Bowel sounds are normal. There is no distension.     Palpations: Abdomen is soft. There is no mass.     Tenderness: There is no abdominal tenderness. There is no guarding or rebound.  Musculoskeletal:        General: No tenderness.     Cervical back: Normal range of motion and neck supple. No rigidity.  Lymphadenopathy:     Cervical: No cervical adenopathy.  Skin:    Findings: No erythema or rash.  Neurological:     Cranial Nerves: No cranial nerve deficit.     Motor: No abnormal muscle tone.     Coordination: Coordination normal.     Gait: Gait abnormal.     Deep Tendon Reflexes: Reflexes normal.  Psychiatric:        Behavior: Behavior normal.        Thought Content: Thought content normal.        Judgment: Judgment normal.   R knee w/pain  Lab Results  Component Value Date   WBC 5.9 12/12/2023   HGB 12.9 12/12/2023   HCT 41.3 12/12/2023   PLT 337.0 12/12/2023   GLUCOSE 128 (H) 12/12/2023   CHOL 222 (H) 11/16/2022   TRIG 273.0 (H) 11/16/2022   HDL 40.40 11/16/2022   LDLDIRECT 124.0 11/16/2022   LDLCALC 130 (H) 06/24/2017   ALT 23 12/12/2023   AST 18 12/12/2023   NA 140 12/12/2023   K 3.1 (L) 12/12/2023   CL 101 12/12/2023   CREATININE 1.28 (H) 12/12/2023   BUN 18 12/12/2023   CO2 32 12/12/2023   TSH 1.61 01/06/2022   INR 0.9 10/13/2018   HGBA1C 6.3 05/19/2023   MICROALBUR <0.7 11/16/2022    DG Bone Density Result Date: 07/05/2023 EXAM: DUAL X-RAY ABSORPTIOMETRY (DXA) FOR BONE MINERAL DENSITY IMPRESSION: Referring Physician:  Genia Horn Your patient completed a bone mineral density test using GE Lunar iDXA system (analysis version: 16). Technologist: BEC PATIENT: Name: Monica Horn, Monica Horn Patient ID: [id number] Birth Date: April 30, 1946 Height: 60.5 in. Sex: Female Measured: 07/05/2023 Weight: 169.8 lbs. Indications:  Advanced Age, Diabetic non insulin, Estrogen Deficient, Left hip replaced, osteoarthritis, Postmenopausal, Protonix , Secondary Osteoporosis Fractures: NONE Treatments: Calcium (E943.0), Vitamin D (E933.5) ASSESSMENT: The BMD measured at Femur Neck is 0.891 g/cm2 with a T-score of -1.1. This patient is considered osteopenic/low bone mass according to World Health Organization [facility]) criteria. Left hip excluded due to surgical hardware.  The quality of the exam is good. Site Region Measured Date Measured Age YA BMD Significant CHANGE T-score Right Femur Neck   8/05[age]2024    76.6         -1.1    0.891 g/cm2 AP Spine    L1-L4  8/05/2024    76.7         -0.5    1.124 g/cm2 World Health Organization [facility]) criteria for post-menopausal, Caucasian Women: Normal       T-score at or above -1 SD Osteopenia   T-score between -1 and -2.5 SD Osteoporosis T-score at or below -2.5 SD RECOMMENDATION: 1. All patients should optimize calcium and vitamin D intake. 2. Consider FDA-approved medical therapies in postmenopausal women and men aged 85 years and older, based on the following: a. A hip or vertebral (clinical or morphometric) fracture. b. T-score = -2.5 at the femoral neck or spine after appropriate evaluation to exclude secondary causes. c. Low bone mass (T-score between -1.0 and -2.5 at the femoral neck or spine) and a 10-year probability of a hip fracture = 3% or a 10-year probability of a major osteoporosis-related fracture = 20% based on the [place] -adapted WHO algorithm. d. Clinician judgment and/or patient preferences may indicate treatment for people with 10-year fracture probabilities above or below these levels. FOLLOW-UP: Patients with diagnosis of osteoporosis or at high risk for fracture should have regular bone mineral density tests.? Patients eligible for Medicare are allowed routine testing every 2 years.? The testing frequency can be increased to one year for patients who have rapidly progressing disease, are  receiving or discontinuing medical therapy to restore bone mass, or have additional risk factors. I have reviewed this study and agree with the findings. [facility] FRAX* 10-year Probability of Fracture Based on femoral neck BMD: Femur (Right) Major Osteoporotic Fracture: 4.5% Hip Fracture:                0.7% Population:                  [place]  (Black) Risk Factors:                Secondary Osteoporosis *FRAX is a Armed forces logistics/support/administrative officer of the [employer] of [employer] for Metabolic Bone Disease, a World Science writer (WHO) [employer]. ASSESSMENT: The probability of a major osteoporotic fracture is 4.5% within the next ten years. The probability of a hip fracture is 0.7% within the next ten years. Electronically Signed   By: Tita Form M.D.   On: 07/05/2023 08:57    Assessment & Plan:   Problem List Items Addressed This Visit     Type II diabetes mellitus   Repaginade Check A1c      Relevant Orders   Hemoglobin A1c   Comprehensive metabolic panel with GFR   Anemia, unspecified   Eat Iron rich foods Labs discussed      HTN (hypertension)   Cont on Maxzide      Hematochezia - Primary   Angie is getting  colonoscopy/endoscopy done in May Off Celebrex       Relevant Orders   Comprehensive metabolic panel with GFR   CBC with Differential/Platelet   Osteoarthritis of right knee   R TKR is considered Angie is getting  colonoscopy/endoscopy done prior Off Celebrex          No orders of the defined types were placed in this encounter.     Follow-up: Return in about 3 months (around 03/13/2024) for a follow-up  visit.  Anitra Barn, MD

## 2023-12-13 NOTE — Assessment & Plan Note (Addendum)
 R TKR is considered Monica Horn is getting  colonoscopy/endoscopy done prior Off Celebrex 

## 2023-12-20 ENCOUNTER — Other Ambulatory Visit: Payer: Self-pay | Admitting: Internal Medicine

## 2023-12-20 DIAGNOSIS — Z6830 Body mass index (BMI) 30.0-30.9, adult: Secondary | ICD-10-CM | POA: Diagnosis not present

## 2023-12-20 DIAGNOSIS — R87612 Low grade squamous intraepithelial lesion on cytologic smear of cervix (LGSIL): Secondary | ICD-10-CM | POA: Diagnosis not present

## 2023-12-20 DIAGNOSIS — Z1231 Encounter for screening mammogram for malignant neoplasm of breast: Secondary | ICD-10-CM

## 2023-12-20 DIAGNOSIS — Z779 Other contact with and (suspected) exposures hazardous to health: Secondary | ICD-10-CM | POA: Diagnosis not present

## 2023-12-20 DIAGNOSIS — Z124 Encounter for screening for malignant neoplasm of cervix: Secondary | ICD-10-CM | POA: Diagnosis not present

## 2023-12-27 ENCOUNTER — Ambulatory Visit
Admission: RE | Admit: 2023-12-27 | Discharge: 2023-12-27 | Disposition: A | Discharge status: Home / Self Care | Source: Ambulatory Visit | Attending: Internal Medicine | Admitting: Internal Medicine

## 2023-12-27 ENCOUNTER — Ambulatory Visit

## 2023-12-27 DIAGNOSIS — Z1231 Encounter for screening mammogram for malignant neoplasm of breast: Secondary | ICD-10-CM | POA: Diagnosis not present

## 2023-12-28 ENCOUNTER — Encounter: Payer: Self-pay | Admitting: Pediatrics

## 2024-01-10 ENCOUNTER — Encounter: Admitting: Pediatrics

## 2024-01-13 DIAGNOSIS — M1711 Unilateral primary osteoarthritis, right knee: Secondary | ICD-10-CM | POA: Diagnosis not present

## 2024-01-26 ENCOUNTER — Telehealth: Payer: Self-pay | Admitting: Physician Assistant

## 2024-01-26 NOTE — Telephone Encounter (Signed)
 Patient wanted to know if she had to take Anusol  suppositories with her prep. I informed patient that the suppositories were not part of her prep but are for her hemorrhoid symptoms.Patient had no questions at the end of call.

## 2024-01-26 NOTE — Telephone Encounter (Signed)
 Patient requesting f/u call to be advised if it is neccessary for her to purchase a suppository for upcoming procedure.   Please advise.

## 2024-02-02 DIAGNOSIS — R87612 Low grade squamous intraepithelial lesion on cytologic smear of cervix (LGSIL): Secondary | ICD-10-CM | POA: Diagnosis not present

## 2024-02-03 DIAGNOSIS — H04123 Dry eye syndrome of bilateral lacrimal glands: Secondary | ICD-10-CM | POA: Diagnosis not present

## 2024-02-03 DIAGNOSIS — H5203 Hypermetropia, bilateral: Secondary | ICD-10-CM | POA: Diagnosis not present

## 2024-02-03 DIAGNOSIS — H25813 Combined forms of age-related cataract, bilateral: Secondary | ICD-10-CM | POA: Diagnosis not present

## 2024-02-03 DIAGNOSIS — H35033 Hypertensive retinopathy, bilateral: Secondary | ICD-10-CM | POA: Diagnosis not present

## 2024-02-03 DIAGNOSIS — H40013 Open angle with borderline findings, low risk, bilateral: Secondary | ICD-10-CM | POA: Diagnosis not present

## 2024-02-03 LAB — OPHTHALMOLOGY REPORT-SCANNED

## 2024-02-06 ENCOUNTER — Telehealth: Payer: Self-pay | Admitting: Physician Assistant

## 2024-02-06 NOTE — Telephone Encounter (Signed)
 Inbound call from patient requesting a call regarding medication questions she has for upcoming colonoscopy. Please advise, thank you.

## 2024-02-06 NOTE — Telephone Encounter (Signed)
 No answer, mailbox full

## 2024-02-07 NOTE — Telephone Encounter (Signed)
 Called patient  and LM to call back or use MyChart  so we can see how we can help. Note: called pharmacy and patient picked up Suprep in April. MyChart message also sent to pt

## 2024-02-07 NOTE — Progress Notes (Unsigned)
 [facility] History and Physical   Primary Care Physician:  Garald Karlynn GAILS, MD   Reason for Procedure:  GERD, history of H. pylori gastritis, rectal bleeding, constipation, history of adenomatous colon polyps including a tubulovillous adenoma with high-grade dysplasia 2008  Plan:    Upper endoscopy and colonoscopy     HPI: Monica Horn is a 78 y.o. female undergoing upper endoscopy and colonoscopy for investigation of GERD, history of H. pylori gastritis, rectal bleeding, constipation and adenomatous colon polyps.  Patient underwent EGD in 2019 for IDA.  Gastric biopsies showed evidence of H. pylori treated with Pylera.  No eradication study is documented.  Patient endorses symptoms of GERD that are currently well-controlled on pantoprazole .  Occasional dysphagia to potassium pill.  Reports constipation having bowel movements 2-3 times per week.  Endorses bright red blood on tissue paper after wiping that improved after ceasing Celebrex .  She had a negative FOBT in the office.  There is a pertinent history of adenomatous colon polyps on multiple colonoscopies including a tubulovillous adenoma with high-grade dysplasia in 2008.  Most recent colonoscopy was performed in March 2022 revealing several polyps and internal hemorrhoids.  A 3-year follow-up colonoscopy was advised.   Past Medical History:  Diagnosis Date   Abnormal EKG 2014   NONSPECIFIC ST DEPRESSION + T ANORMALITY, WORKED UP 2014 DR CATHLYN BIRMINGHAM AND RELEASED BY DR BIRMINGHAM   Acute sinusitis 07/01/2021   11/22, 2/23 Omnicef   Augmentin  bid x 10 d   Acute tonsillitis 08/05/2008   Allergic rhinitis 11/11/2015   3//17 Zyrtec   Use Sinus rinse - given   Anemia, unspecified 10/27/2007   Ankle swelling 04/23/2019   Blood in stool 10/27/2007   Recurrent   Chronic   Dr Aneita  Colon is due this fall 2019   Blood transfusion without reported diagnosis    Carpal tunnel syndrome 10/27/2007   R>L  She had L wrist operated  on      Cerumen impaction 12/01/2009   B wax      Chest pain, atypical 08/26/2012   Chronic knee pain after total replacement of right knee joint 04/21/2022   Closed undisplaced fracture of nasal bone 12/09/2016   Colon polyps    Cystitis 09/08/2009   Dizziness 04/04/2009   Dog bite 09/27/2022   Face 09/2022  S/p Rabies shots   Dyslipidemia 06/20/2007   Chronic   Statin intolerant  On diet  Use fish oil   Eruption cyst 04/05/2014   Fatty infiltration of liver    Finger contusion 11/30/2010   R 4th   Foot pain 06/13/2012   2020 L dorsal pain - OA/capsulitis  2021 L ankle pain since the Fall of 2020 - saw dr Magdalen, was wearing a boot in may 2021  Inflammatory tendinitis left posterior tib along with flatfoot deformity which is contributory towards the problem  6/21 Treat edema of LEs  Medrol  pac  Sports med ref if not better   Forehead contusion 11/19/2016   C/o hitting a cabinet after chocking on ice - fell; ?passed out at the client's house November 04 2016; had a hematoma on the central forehead. She had a HA x1d. She had a black eye later. No n/v.  Nose bones X ray IMPRESSION:  Depressed anterior nasal bone fractures bilaterally.     Mild swelling of the nasal septal soft tissues.        Electronically Signed    By: Selinda DELENA Blue M.D.    On: 04/12/201  Ganglion cyst 11/11/2015   L wrist tender 1 cm ganglion 3/17  Dr Camella  ?L foot 2020   Gastric polyp 07/18/2018   GERD (gastroesophageal reflux disease)    Hematochezia 10/27/2007   Hemorrhage of gastrointestinal tract 10/27/2007   Hemorrhoids 10/27/2007   History of COVID-19 09/01/2019   History of revision of total replacement of left hip joint 11/17/2018   HTN (hypertension) 07/11/2007   Chronic - on Norvasc  - d/c (swelling)  D/c Benazepril  due to cough 6/16 - restarted 8/16  D/c Losartan  due to HA  9/20 - Cont on Maxzide   Hydradenitis 01/2009   Right S aureus   Ingrown right big toenail 09/27/2014   Near syncope 10/13/2012    2/14  Abn ECHO   OA (osteoarthritis) of hip 10/18/2018   Osteoarthritis of right knee 04/25/2022   Otitis externa 07/27/2010   2020  Corisporin      Rash and nonspecific skin eruption 04/05/2014   8/15 recurrent - R inner thigh  ?herpes - pt decline Rx   Shoulder pain, right 11/30/2010   R rot cuff strain, repeat 2023  Arnica gel or cream on bruises     S/p PT at [facility]  Blue-Emu cream was recommended  2-3 times a day   Sinus infection 10/02/2018   Statin intolerance 07/29/2020   Tennis elbow 01/13/2021   5/22 L  Ice, Voltaren gel, massage   Tubulovillous adenoma polyp of colon 03/2007   w/ focal high grade dysplasia, no carcinoma   Type II diabetes mellitus 06/12/2007   Diet controlled  CT ca scoring info given 12/19, advised     Not taking Repaginate - re-start  Risks associated with treatment noncompliance were discussed. Compliance was encouraged.  Re-start Rybelsus  - use Assistance program, if covered - too $$$   URI, acute 06/11/2011   10/12, 10/13, 4/15, 11/15, 3/16, 6/16, 2/20, 1/21   UTI (urinary tract infection) 01/04/2014    Past Surgical History:  Procedure Laterality Date   CARPAL TUNNEL RELEASE     Lt  wrist, rt wrist 2014   COLONOSCOPY     POLYPECTOMY     TOTAL HIP ARTHROPLASTY Left 10/18/2018   Procedure: TOTAL HIP ARTHROPLASTY ANTERIOR APPROACH;  Surgeon: Melodi Lerner, MD;  Location: [facility];  Service: Orthopedics;  Laterality: Left;    TUBAL LIGATION      Prior to Admission medications   Medication Sig Start Date End Date Taking? Authorizing Provider  aspirin 81 MG chewable tablet Chew by mouth daily.    [provider]  celecoxib  (CELEBREX ) 200 MG capsule TAKE 1 CAPSULE EVERY DAY AS NEEDED Patient not taking: Reported on 12/13/2023 12/09/22   Plotnikov, Aleksei V, MD  cetirizine  (ZYRTEC ) 10 MG tablet Take 1 tablet (10 mg total) by mouth daily as needed. 08/06/21   Plotnikov, Aleksei V, MD  cholecalciferol (VITAMIN D3) 10 MCG (400 UNIT) TABS  tablet Take 1,000 Units by mouth daily.    [provider]  fluticasone  (FLONASE ) 50 MCG/ACT nasal spray Use 2 spray(s) in each nostril once daily 08/03/23   Plotnikov, Aleksei V, MD  furosemide  (LASIX ) 20 MG tablet TAKE 1 TO 2 TABLETS EVERY DAY 12/09/22   Plotnikov, Aleksei V, MD  hydrocortisone  (ANUSOL -HC) 25 MG suppository Place 1 suppository (25 mg total) rectally 2 (two) times daily. 12/12/23   Craig Alan SAUNDERS, PA-C  memantine  (NAMENDA ) 5 MG tablet Take 1 tablet (5 mg total) by mouth 2 (two) times daily. Patient not taking: Reported on  12/13/2023 08/23/22   Wertman, Sara E, PA-C  Multiple Vitamin (MULTIVITAMIN) tablet Take 1 tablet by mouth daily.    [provider]  NON FORMULARY OTC LION MAIN  Once a day    [provider]  pantoprazole  (PROTONIX ) 40 MG tablet TAKE 1 TABLET EVERY DAY 12/09/22   Plotnikov, Aleksei V, MD  polyethylene glycol (MIRALAX ) 17 g packet Take 17 g by mouth daily as needed. 12/12/23   Craig Alan SAUNDERS, PA-C  potassium chloride  SA (KLOR-CON  M) 20 MEQ tablet Take 1 tablet (20 mEq total) by mouth daily. 01/07/22   Plotnikov, Aleksei V, MD  repaglinide  (PRANDIN ) 1 MG tablet Take 1 tablet (1 mg total) by mouth 3 (three) times daily before meals. 08/17/22   Plotnikov, Aleksei V, MD  triamterene -hydrochlorothiazide (MAXZIDE-25) 37.5-25 MG tablet TAKE 1 TABLET EVERY DAY 12/09/22   Plotnikov, Aleksei V, MD  trolamine salicylate (ASPERCREME) 10 % cream Apply 1 application topically 2 (two) times daily as needed for muscle pain.    [provider]    Current Outpatient Medications  Medication Sig Dispense Refill   aspirin 81 MG chewable tablet Chew by mouth daily.     celecoxib  (CELEBREX ) 200 MG capsule TAKE 1 CAPSULE EVERY DAY AS NEEDED (Patient not taking: Reported on 12/13/2023) 90 capsule 3   cetirizine  (ZYRTEC ) 10 MG tablet Take 1 tablet (10 mg total) by mouth daily as needed. 90 tablet 3   cholecalciferol (VITAMIN D3) 10 MCG (400 UNIT) TABS  tablet Take 1,000 Units by mouth daily.     fluticasone  (FLONASE ) 50 MCG/ACT nasal spray Use 2 spray(s) in each nostril once daily 16 g 5   furosemide  (LASIX ) 20 MG tablet TAKE 1 TO 2 TABLETS EVERY DAY 180 tablet 3   hydrocortisone  (ANUSOL -HC) 25 MG suppository Place 1 suppository (25 mg total) rectally 2 (two) times daily. 12 suppository 0   memantine  (NAMENDA ) 5 MG tablet Take 1 tablet (5 mg total) by mouth 2 (two) times daily. (Patient not taking: Reported on 12/13/2023) 60 tablet 11   Multiple Vitamin (MULTIVITAMIN) tablet Take 1 tablet by mouth daily.     NON FORMULARY OTC LION MAIN  Once a day     pantoprazole  (PROTONIX ) 40 MG tablet TAKE 1 TABLET EVERY DAY 90 tablet 3   polyethylene glycol (MIRALAX ) 17 g packet Take 17 g by mouth daily as needed.     potassium chloride  SA (KLOR-CON  M) 20 MEQ tablet Take 1 tablet (20 mEq total) by mouth daily. 90 tablet 3   repaglinide  (PRANDIN ) 1 MG tablet Take 1 tablet (1 mg total) by mouth 3 (three) times daily before meals. 270 tablet 3   triamterene -hydrochlorothiazide (MAXZIDE-25) 37.5-25 MG tablet TAKE 1 TABLET EVERY DAY 90 tablet 3   trolamine salicylate (ASPERCREME) 10 % cream Apply 1 application topically 2 (two) times daily as needed for muscle pain.     No current facility-administered medications for this visit.    Allergies as of 02/09/2024 - Review Complete 12/13/2023  Allergen Reaction Noted   Amlodipine   04/17/2019   Ibuprofen   01/06/2022   Benazepril  Cough 01/20/2015   Losartan  Other (See Comments) 04/04/2015   Lovastatin   08/03/2013    Family History  Problem Relation Age of Onset   Stroke Mother    Hypertension Mother    Lung cancer Mother    Cancer Father        ? liver ca   Alcohol abuse Father    Hypertension Other    Stroke  Other    Colon cancer Neg Hx    Colon polyps Neg Hx    Rectal cancer Neg Hx    Stomach cancer Neg Hx     Social History   Socioeconomic History   Marital status: Married    Spouse name:  Not on file   Number of children: 3   Years of education: 13   Highest education level: Some college, no degree  Occupational History   Occupation: Retired/  Tobacco Use   Smoking status: Former    Current packs/day: 0.00    Types: Cigarettes    Quit date: 1979    Years since quitting: 46.5   Smokeless tobacco: Never  Vaping Use   Vaping status: Never Used  Substance and Sexual Activity   Alcohol use: Not Currently    Comment: occasional   Drug use: No   Sexual activity: Yes  Other Topics Concern   Not on file  Social History Narrative   Rigth handed   Lives with husband   One story home   Occasionally cafffeine   Social Drivers of Health   Financial Resource Strain: Low Risk  (06/27/2023)   Overall Financial Resource Strain (CARDIA)    Difficulty of Paying Living Expenses: Not hard at all  Food Insecurity: No Food Insecurity (06/27/2023)   Hunger Vital Sign    Worried About Running Out of Food in the Last Year: Never true    Ran Out of Food in the Last Year: Never true  Transportation Needs: No Transportation Needs (06/27/2023)   PRAPARE - Administrator, Civil Service (Medical): No    Lack of Transportation (Non-Medical): No  Physical Activity: Unknown (06/27/2023)   Exercise Vital Sign    Days of Exercise per Week: 4 days    Minutes of Exercise per Session: Not on file  Stress: No Stress Concern Present (06/27/2023)   [employer] of Occupational Health - Occupational Stress Questionnaire    Feeling of Stress : Not at all  Social Connections: Unknown (06/27/2023)   Social Connection and Isolation Panel    Frequency of Communication with Friends and Family: More than three times a week    Frequency of Social Gatherings with Friends and Family: More than three times a week    Attends Religious Services: More than 4 times per year    Active Member of [employer] or Organizations: Yes    Attends Banker Meetings: More than 4 times per year     Marital Status: Not on file  Intimate Partner Violence: Not At Risk (06/27/2023)   Humiliation, Afraid, Rape, and Kick questionnaire    Fear of Current or Ex-Partner: No    Emotionally Abused: No    Physically Abused: No    Sexually Abused: No    Review of Systems:  All other review of systems negative except as mentioned in the HPI.  Physical Exam: Vital signs There were no vitals taken for this visit.  General:   Alert,  Well-developed, well-nourished, pleasant and cooperative in NAD Airway:  Mallampati  Lungs:  Clear throughout to auscultation.   Heart:  Regular rate and rhythm; no murmurs, clicks, rubs,  or gallops. Abdomen:  Soft, nontender and nondistended. Normal bowel sounds.   Neuro/Psych:  Normal mood and affect. A and O x 3  Inocente Hausen, MD [facility]

## 2024-02-09 ENCOUNTER — Ambulatory Visit: Admitting: Pediatrics

## 2024-02-09 ENCOUNTER — Encounter: Payer: Self-pay | Admitting: Pediatrics

## 2024-02-09 VITALS — BP 114/70 | HR 78 | Temp 97.9°F | Resp 11 | Ht 61.0 in | Wt 166.0 lb

## 2024-02-09 DIAGNOSIS — Z1211 Encounter for screening for malignant neoplasm of colon: Secondary | ICD-10-CM

## 2024-02-09 DIAGNOSIS — K317 Polyp of stomach and duodenum: Secondary | ICD-10-CM | POA: Diagnosis not present

## 2024-02-09 DIAGNOSIS — K219 Gastro-esophageal reflux disease without esophagitis: Secondary | ICD-10-CM | POA: Diagnosis not present

## 2024-02-09 DIAGNOSIS — K625 Hemorrhage of anus and rectum: Secondary | ICD-10-CM

## 2024-02-09 DIAGNOSIS — A048 Other specified bacterial intestinal infections: Secondary | ICD-10-CM | POA: Diagnosis not present

## 2024-02-09 DIAGNOSIS — K2950 Unspecified chronic gastritis without bleeding: Secondary | ICD-10-CM | POA: Diagnosis not present

## 2024-02-09 DIAGNOSIS — K635 Polyp of colon: Secondary | ICD-10-CM

## 2024-02-09 DIAGNOSIS — D122 Benign neoplasm of ascending colon: Secondary | ICD-10-CM

## 2024-02-09 DIAGNOSIS — I10 Essential (primary) hypertension: Secondary | ICD-10-CM | POA: Diagnosis not present

## 2024-02-09 DIAGNOSIS — K6289 Other specified diseases of anus and rectum: Secondary | ICD-10-CM | POA: Diagnosis not present

## 2024-02-09 DIAGNOSIS — D175 Benign lipomatous neoplasm of intra-abdominal organs: Secondary | ICD-10-CM | POA: Diagnosis not present

## 2024-02-09 DIAGNOSIS — K5900 Constipation, unspecified: Secondary | ICD-10-CM

## 2024-02-09 DIAGNOSIS — K644 Residual hemorrhoidal skin tags: Secondary | ICD-10-CM

## 2024-02-09 DIAGNOSIS — K5730 Diverticulosis of large intestine without perforation or abscess without bleeding: Secondary | ICD-10-CM | POA: Diagnosis not present

## 2024-02-09 DIAGNOSIS — K648 Other hemorrhoids: Secondary | ICD-10-CM | POA: Diagnosis not present

## 2024-02-09 MED ORDER — SODIUM CHLORIDE 0.9 % IV SOLN: 500.0000 mL | Freq: Once | INTRAVENOUS | Status: DC

## 2024-02-09 NOTE — Progress Notes (Signed)
 Called to room to assist during endoscopic procedure.  Patient ID and intended procedure confirmed with present staff. Received instructions for my participation in the procedure from the performing physician.

## 2024-02-09 NOTE — Progress Notes (Signed)
 1354 Robinul 0.1 mg IV given due large amount of secretions upon assessment.  MD made aware, vss

## 2024-02-09 NOTE — Patient Instructions (Addendum)
-  Handout on polyps and gastritis provided -Await pathology results  OU HAD AN ENDOSCOPIC PROCEDURE TODAY AT THE [facility]:   Refer to the procedure report that was given to you for any specific questions about what was found during the examination.  If the procedure report does not answer your questions, please call your gastroenterologist to clarify.  If you requested that your care partner not be given the details of your procedure findings, then the procedure report has been included in a sealed envelope for you to review at your convenience later.  YOU SHOULD EXPECT: Some feelings of bloating in the abdomen. Passage of more gas than usual.  Walking can help get rid of the air that was put into your GI tract during the procedure and reduce the bloating. If you had a lower endoscopy (such as a colonoscopy or flexible sigmoidoscopy) you may notice spotting of blood in your stool or on the toilet paper. If you underwent a bowel prep for your procedure, you may not have a normal bowel movement for a few days.  Please Note:  You might notice some irritation and congestion in your nose or some drainage.  This is from the oxygen used during your procedure.  There is no need for concern and it should clear up in a day or so.  SYMPTOMS TO REPORT IMMEDIATELY:  Following lower endoscopy (colonoscopy or flexible sigmoidoscopy):  Excessive amounts of blood in the stool  Significant tenderness or worsening of abdominal pains  Swelling of the abdomen that is new, acute  Fever of 100F or higher  Following upper endoscopy (EGD)  Vomiting of blood or coffee ground material  New chest pain or pain under the shoulder blades  Painful or persistently difficult swallowing  New shortness of breath  Fever of 100F or higher  Black, tarry-looking stools  For urgent or emergent issues, a gastroenterologist can be reached at any hour by calling [phone]. Do not use MyChart messaging for urgent  concerns.    DIET:  We do recommend a small meal at first, but then you may proceed to your regular diet.  Drink plenty of fluids but you should avoid alcoholic beverages for 24 hours.  ACTIVITY:  You should plan to take it easy for the rest of today and you should NOT DRIVE or use heavy machinery until tomorrow (because of the sedation medicines used during the test).    FOLLOW UP: Our staff will call the number listed on your records the next business day following your procedure.  We will call around 7:15- 8:00 am to check on you and address any questions or concerns that you may have regarding the information given to you following your procedure. If we do not reach you, we will leave a message.     If any biopsies were taken you will be contacted by phone or by letter within the next 1-3 weeks.  Please call us  at [phone] if you have not heard about the biopsies in 3 weeks.    SIGNATURES/CONFIDENTIALITY: You and/or your care partner have signed paperwork which will be entered into your electronic medical record.  These signatures attest to the fact that that the information above on your After Visit Summary has been reviewed and is understood.  Full responsibility of the confidentiality of this discharge information lies with you and/or your care-partner.

## 2024-02-09 NOTE — Progress Notes (Signed)
 1359  Pt experienced laryngeal spasm with jaw thrust and PPV performed. Nasopharyngeal airway size 7.0  placed without trauma, vss

## 2024-02-09 NOTE — Progress Notes (Signed)
 1404  Pt experienced laryngeal spasm with jaw thrust and PPV performed. vss

## 2024-02-09 NOTE — Op Note (Signed)
 [facility] Patient Name: Monica Horn Procedure Date: 02/09/2024 1:39 PM MRN: [id number] Endoscopist: Inocente Hausen , MD, [id number] Age: 78 Referring MD:  Date of Birth: 06-05-1946 Gender: Female Account #: [id number] Procedure:                Colonoscopy Indications:              High risk colon cancer surveillance: Personal                            history of adenoma (10 mm or greater in size), High                            risk colon cancer surveillance: Personal history of                            adenoma with high grade dysplasia, High risk colon                            cancer surveillance: Personal history of adenoma                            with villous component, Last colonoscopy: March                            2022; incidental notation of constipation and                            rectal bleeding Medicines:                Monitored Anesthesia Care Procedure:                Pre-Anesthesia Assessment:                           - Prior to the procedure, a History and Physical                            was performed, and patient medications and                            allergies were reviewed. The patient's tolerance of                            previous anesthesia was also reviewed. The risks                            and benefits of the procedure and the sedation                            options and risks were discussed with the patient.                            All questions were answered, and informed consent  was obtained. Prior Anticoagulants: The patient has                            taken no anticoagulant or antiplatelet agents. ASA                            Grade Assessment: II - A patient with mild systemic                            disease. After reviewing the risks and benefits,                            the patient was deemed in satisfactory condition to                            undergo the  procedure.                           After obtaining informed consent, the colonoscope                            was passed under direct vision. Throughout the                            procedure, the patient's blood pressure, pulse, and                            oxygen saturations were monitored continuously. The                            Olympus Scope [id number] was introduced through the                            anus and advanced to the cecum, identified by                            appendiceal orifice and ileocecal valve. The                            colonoscopy was performed without difficulty. The                            patient tolerated the procedure well. The quality                            of the bowel preparation was adequate. The                            ileocecal valve, appendiceal orifice, and rectum                            were photographed. Scope In: 2:14:37 PM Scope Out: 2:30:32 PM Scope Withdrawal Time: 0 hours 10 minutes 33 seconds  Total Procedure Duration: 0 hours 15 minutes  55 seconds  Findings:                 Hemorrhoids were found on perianal exam.                           The digital rectal exam findings include decreased                            sphincter tone.                           A 4 mm polyp was found in the ascending colon. The                            polyp was sessile. The polyp was removed with a                            cold biopsy forceps. Resection and retrieval were                            complete.                           A tattoo was seen at the hepatic flexure. The                            tattoo site appeared normal.                           A few small diverticula were seen in the sigmoid                            colon                           One small lipoma was seen in the ascending colon                           Internal hemorrhoids were found during retroflexion. Complications:            No  immediate complications. Estimated blood loss:                            Minimal. Estimated Blood Loss:     Estimated blood loss was minimal. Impression:               - Hemorrhoids found on perianal exam.                           - Decreased sphincter tone found on digital rectal                            exam.                           - One 4 mm polyp in the ascending colon, removed  with a cold biopsy forceps. Resected and retrieved.                           - A tattoo was seen at the hepatic flexure. The                            tattoo site appeared normal.                           - One small lipoma in the ascending colon                           - A few small diverticula are seen in the sigmoid                            colon.                           - Internal hemorrhoids. Internal and external                            hemorrhoids may explain the patient's rectal                            bleeding Recommendation:           - Discharge patient to home (ambulatory).                           - Await pathology results.                           - Given age and benign findings at the time of most                            recent colonoscopy no further colon polyp                            surveillance recommended.                           - The findings and recommendations were discussed                            with the patient's family.                           - Return to GI clinic in 2 months with Dr. Suzann                            or APP.                           - Patient has a contact number available for  emergencies. The signs and symptoms of potential                            delayed complications were discussed with the                            patient. Return to normal activities tomorrow.                            Written discharge instructions were provided to the                             patient. Inocente Hausen, MD 02/09/2024 2:45:50 PM This report has been signed electronically.

## 2024-02-09 NOTE — Op Note (Signed)
 [facility] Patient Name: Monica Horn Procedure Date: 02/09/2024 1:50 PM MRN: [id number] Endoscopist: Inocente Hausen , MD, [id number] Age: 78 Referring MD:  Date of Birth: April 07, 1946 Gender: Female Account #: [id number] Procedure:                Upper GI endoscopy Indications:              Follow-up of gastro-esophageal reflux disease,                            Previously treated for Helicobacter pylori                            -received Pylera, History of gastric polyp Medicines:                Monitored Anesthesia Care Procedure:                Pre-Anesthesia Assessment:                           - Prior to the procedure, a History and Physical                            was performed, and patient medications and                            allergies were reviewed. The patient's tolerance of                            previous anesthesia was also reviewed. The risks                            and benefits of the procedure and the sedation                            options and risks were discussed with the patient.                            All questions were answered, and informed consent                            was obtained. Prior Anticoagulants: The patient has                            taken no anticoagulant or antiplatelet agents. ASA                            Grade Assessment: II - A patient with mild systemic                            disease. After reviewing the risks and benefits,                            the patient was deemed in satisfactory condition to  undergo the procedure.                           After obtaining informed consent, the endoscope was                            passed under direct vision. Throughout the                            procedure, the patient's blood pressure, pulse, and                            oxygen saturations were monitored continuously. The                            Olympus Scope  [id number] was introduced through the                            mouth, and advanced to the second part of duodenum.                            The upper GI endoscopy was accomplished without                            difficulty. The patient tolerated the procedure                            well. Scope In: Scope Out: Findings:                 The examined esophagus was normal.                           Scattered mild inflammation characterized by                            adherent blood was found in the gastric body.                            Biopsies were taken with a cold forceps for                            Helicobacter pylori testing. There was oozing from                            one of the biopsy sites. To prevent bleeding after                            the biopsy, two hemostatic clips were successfully                            placed. There was no bleeding during the procedure.                           The gastric antrum, cardia (on retroflexion)  and                            gastric fundus (on retroflexion) were normal.                            Biopsies were taken with a cold forceps for                            Helicobacter pylori testing.                           The duodenal bulb and second portion of the                            duodenum were normal. Complications:            Patient had evidence of upper airway obstruction                            due to tongue obstructing airway requiring                            placement of a nasal airway. Estimated Blood Loss:     Estimated blood loss was minimal. Impression:               - Normal esophagus.                           - Gastritis. Biopsied. Clips were placed to treat                            oozing status post biopsy                           - Normal antrum, cardia and gastric fundus.                            Biopsied.                           - Normal duodenal bulb and second portion of  the                            duodenum. Recommendation:           - Await pathology results.                           - Perform a colonoscopy today.                           - The findings and recommendations were discussed                            with the patient's family. Inocente Hausen, MD 02/09/2024 2:35:54 PM This report has been signed electronically.

## 2024-02-09 NOTE — Progress Notes (Signed)
 1430 Pt wheezing, albuterol neb given.

## 2024-02-10 ENCOUNTER — Telehealth: Payer: Self-pay

## 2024-02-10 NOTE — Telephone Encounter (Signed)
 Post procedure follow up call, no answer

## 2024-02-13 ENCOUNTER — Telehealth: Payer: Self-pay | Admitting: Pediatrics

## 2024-02-13 NOTE — Telephone Encounter (Signed)
 Patient is calling about finding out what exactly her pathology results are and what would be the cause of her rectal bleeding. Patient is requesting a call back from the nurse and stated that she she does not answer to please leave a detailed VM and a call back number. Please advise.

## 2024-02-13 NOTE — Telephone Encounter (Signed)
 Called and spoke with patient. Informed patient that pathology results have not returned at this time, but she will be notified once they have been reviewed by MD. Per colonoscopy reports rectal bleeding was likely due to hemorrhoids. Patient verbalized understanding and had no concerns at the end of the call.

## 2024-02-14 ENCOUNTER — Ambulatory Visit: Payer: Self-pay | Admitting: Pediatrics

## 2024-02-14 LAB — SURGICAL PATHOLOGY

## 2024-02-27 ENCOUNTER — Ambulatory Visit (HOSPITAL_COMMUNITY)
Admission: EM | Admit: 2024-02-27 | Discharge: 2024-02-27 | Disposition: A | Discharge status: Home / Self Care | Attending: Internal Medicine | Admitting: Internal Medicine

## 2024-02-27 ENCOUNTER — Encounter (HOSPITAL_COMMUNITY): Payer: Self-pay

## 2024-02-27 DIAGNOSIS — S0100XA Unspecified open wound of scalp, initial encounter: Secondary | ICD-10-CM

## 2024-02-27 DIAGNOSIS — E11628 Type 2 diabetes mellitus with other skin complications: Secondary | ICD-10-CM | POA: Diagnosis not present

## 2024-02-27 MED ORDER — MUPIROCIN 2 % EX OINT
1.0000 | TOPICAL_OINTMENT | Freq: Two times a day (BID) | CUTANEOUS | 0 refills | Status: DC
Start: 2024-02-27 — End: 2024-04-20

## 2024-02-27 MED ORDER — DEXAMETHASONE SODIUM PHOSPHATE 10 MG/ML IJ SOLN: 10.0000 mg | Freq: Once | INTRAMUSCULAR | Status: DC

## 2024-02-27 MED ORDER — DOXYCYCLINE HYCLATE 100 MG PO CAPS: 100.0000 mg | ORAL_CAPSULE | Freq: Two times a day (BID) | ORAL | 0 refills | Status: DC

## 2024-02-27 MED ORDER — CEPHALEXIN 500 MG PO CAPS
500.0000 mg | ORAL_CAPSULE | Freq: Two times a day (BID) | ORAL | 0 refills | Status: AC
Start: 2024-02-27 — End: 2024-03-05

## 2024-02-27 NOTE — Discharge Instructions (Addendum)
 Take antibiotics as prescribed every 12 hours for the next 7 days. Take with food. Apply mupirocin  ointment to the scalp every 12 hours for the next 7 days. Warm compresses. Wash with antibacterial soap every 12 hours, do not scrub as this will cause further irritation to the wound.  Please schedule a follow-up appointment with your PCP in the next 2 to 3 days.  If you develop any new or worsening symptoms or if your symptoms do not start to improve, please return here or follow-up with your primary care provider. If your symptoms are severe, please go to the emergency room.

## 2024-02-27 NOTE — ED Provider Notes (Signed)
 [facility]    CSN: [id number] Arrival date & time: 02/27/24  1338      History   Chief Complaint Chief Complaint  Patient presents with   Rash    HPI Monica Horn is a 78 y.o. female.   Patient presents to urgent care for evaluation of rash/open wound to the occipital scalp that initially started approximately 5 to 6 days ago after she got a Quick Wig done at the hairdresser.  This involves placing a cap to the crown of the head, then a wig on top, the rest of the wig is glued to the occipital scalp hair/skin. She did not have any itching after the glue was placed and denies immediate allergic reaction symptoms to the glue.  3 days ago, she reached to the back of her head and noticed drainage on her hand. She went back to the hairdresser who was able to cut away the glued portion of the wig revealing an open wound to the scalp.  Wound is red, warm, tender, and has now started draining foul-smelling purulent drainage.  She is a type II diabetic, diabetes is well-controlled, last hemoglobin A1c 9 months ago was around 6.  Denies recent antibiotic or steroid use.  Denies fever, chills, neck pain, headache, dizziness, ear pain, and spreading rash to the neck.  She has been using warm compresses with minimal relief prior to arrival.     Past Medical History:  Diagnosis Date   Abnormal EKG 2014   NONSPECIFIC ST DEPRESSION + T ANORMALITY, WORKED UP 2014 DR CATHLYN BIRMINGHAM AND RELEASED BY DR BIRMINGHAM   Acute sinusitis 07/01/2021   11/22, 2/23 Omnicef   Augmentin  bid x 10 d   Acute tonsillitis 08/05/2008   Allergic rhinitis 11/11/2015   3//17 Zyrtec   Use Sinus rinse - given   Allergy    seasonal   Anemia, unspecified 10/27/2007   Ankle swelling 04/23/2019   Asthma    PRN inhalers   Blood in stool 10/27/2007   Recurrent   Chronic   Dr Aneita  Colon is due this fall 2019   Blood transfusion without reported diagnosis    Carpal tunnel syndrome 10/27/2007   R>L  She had L  wrist operated on      Cataract    Cerumen impaction 12/01/2009   B wax      Chest pain, atypical 08/26/2012   Chronic knee pain after total replacement of right knee joint 04/21/2022   Closed undisplaced fracture of nasal bone 12/09/2016   Colon polyps    Cystitis 09/08/2009   Dizziness 04/04/2009   Dog bite 09/27/2022   Face 09/2022  S/p Rabies shots   Dyslipidemia 06/20/2007   Chronic   Statin intolerant  On diet  Use fish oil   Eruption cyst 04/05/2014   Fatty infiltration of liver    Finger contusion 11/30/2010   R 4th   Foot pain 06/13/2012   2020 L dorsal pain - OA/capsulitis  2021 L ankle pain since the Fall of 2020 - saw dr Magdalen, was wearing a boot in may 2021  Inflammatory tendinitis left posterior tib along with flatfoot deformity which is contributory towards the problem  6/21 Treat edema of LEs  Medrol  pac  Sports med ref if not better   Forehead contusion 11/19/2016   C/o hitting a cabinet after chocking on ice - fell; ?passed out at the client's house November 04 2016; had a hematoma on the central forehead. She had  a HA x1d. She had a black eye later. No n/v.  Nose bones X ray IMPRESSION:  Depressed anterior nasal bone fractures bilaterally.     Mild swelling of the nasal septal soft tissues.        Electronically Signed    By: Selinda DELENA Blue M.D.    On: 04/12/201   Ganglion cyst 11/11/2015   L wrist tender 1 cm ganglion 3/17  Dr Camella  ?L foot 2020   Gastric polyp 07/18/2018   GERD (gastroesophageal reflux disease)    Hematochezia 10/27/2007   Hemorrhage of gastrointestinal tract 10/27/2007   Hemorrhoids 10/27/2007   History of COVID-19 09/01/2019   History of revision of total replacement of left hip joint 11/17/2018   HTN (hypertension) 07/11/2007   Chronic - on Norvasc  - d/c (swelling)  D/c Benazepril  due to cough 6/16 - restarted 8/16  D/c Losartan  due to HA  9/20 - Cont on Maxzide   Hydradenitis 01/2009   Right S aureus   Ingrown right big toenail 09/27/2014    Near syncope 10/13/2012   2/14  Abn ECHO   OA (osteoarthritis) of hip 10/18/2018   Osteoarthritis of right knee 04/25/2022   Otitis externa 07/27/2010   2020  Corisporin      Rash and nonspecific skin eruption 04/05/2014   8/15 recurrent - R inner thigh  ?herpes - pt decline Rx   Shoulder pain, right 11/30/2010   R rot cuff strain, repeat 2023  Arnica gel or cream on bruises     S/p PT at [facility]  Blue-Emu cream was recommended  2-3 times a day   Sinus infection 10/02/2018   Statin intolerance 07/29/2020   Tennis elbow 01/13/2021   5/22 L  Ice, Voltaren gel, massage   Tubulovillous adenoma polyp of colon 03/2007   w/ focal high grade dysplasia, no carcinoma   Type II diabetes mellitus 06/12/2007   Diet controlled  CT ca scoring info given 12/19, advised     Not taking Repaginate - re-start  Risks associated with treatment noncompliance were discussed. Compliance was encouraged.  Re-start Rybelsus  - use Assistance program, if covered - too $$$   URI, acute 06/11/2011   10/12, 10/13, 4/15, 11/15, 3/16, 6/16, 2/20, 1/21   UTI (urinary tract infection) 01/04/2014    Patient Active Problem List   Diagnosis Date Noted   Knee pain, chronic 08/22/2023   Memory problem 02/15/2023   Osteoarthritis of right knee 04/25/2022   Chronic knee pain after total replacement of right knee joint 04/21/2022   Tennis elbow 01/13/2021   Statin intolerance 07/29/2020   History of COVID-19 10/04/2019   History of revision of total replacement of left hip joint 11/17/2018   OA (osteoarthritis) of hip 10/18/2018   Gastric polyp 07/18/2018   Colon polyps 07/18/2018   Ganglion cyst 11/11/2015   Allergic rhinitis 11/11/2015   Eruption cyst 04/05/2014   Chest pain, atypical 08/26/2012   GERD (gastroesophageal reflux disease) 08/26/2012   Abnormal EKG 08/26/2012   Shoulder pain, right 11/30/2010   Otitis externa 07/27/2010   Dizziness 04/04/2009   Cellulitis and abscess of other specified site  01/31/2009   Anemia, unspecified 10/27/2007   Carpal tunnel syndrome 10/27/2007   Hemorrhoids 10/27/2007   Hemorrhage of gastrointestinal tract 10/27/2007   Hematochezia 10/27/2007   HTN (hypertension) 07/11/2007   Dyslipidemia 06/20/2007   Type II diabetes mellitus 06/12/2007    Past Surgical History:  Procedure Laterality Date   CARPAL TUNNEL RELEASE  Lt  wrist, rt wrist 2014   COLONOSCOPY     POLYPECTOMY     TOTAL HIP ARTHROPLASTY Left 10/18/2018   Procedure: TOTAL HIP ARTHROPLASTY ANTERIOR APPROACH;  Surgeon: Melodi Lerner, MD;  Location: [facility];  Service: Orthopedics;  Laterality: Left;    TUBAL LIGATION      OB History   No obstetric history on file.      Home Medications    Prior to Admission medications   Medication Sig Start Date End Date Taking? Authorizing Provider  cephALEXin  (KEFLEX ) 500 MG capsule Take 1 capsule (500 mg total) by mouth 2 (two) times daily for 7 days. 02/27/24 03/05/24 Yes StanhopeDorna HERO, FNP  doxycycline  (VIBRAMYCIN ) 100 MG capsule Take 1 capsule (100 mg total) by mouth 2 (two) times daily. 02/27/24  Yes Enedelia Dorna HERO, FNP  mupirocin  ointment (BACTROBAN ) 2 % Apply 1 Application topically 2 (two) times daily. 02/27/24  Yes Enedelia Dorna HERO, FNP  aspirin 81 MG chewable tablet Chew by mouth daily.    [provider]  cetirizine  (ZYRTEC ) 10 MG tablet Take 1 tablet (10 mg total) by mouth daily as needed. 08/06/21   Plotnikov, Aleksei V, MD  cholecalciferol (VITAMIN D3) 10 MCG (400 UNIT) TABS tablet Take 1,000 Units by mouth daily.    [provider]  fluticasone  (FLONASE ) 50 MCG/ACT nasal spray Use 2 spray(s) in each nostril once daily 08/03/23   Plotnikov, Aleksei V, MD  furosemide  (LASIX ) 20 MG tablet TAKE 1 TO 2 TABLETS EVERY DAY Patient not taking: Reported on 02/09/2024 12/09/22   Plotnikov, Aleksei V, MD  hydrocortisone  (ANUSOL -HC) 25 MG suppository Place 1 suppository (25 mg total) rectally 2 (two)  times daily. 12/12/23   Craig Alan SAUNDERS, PA-C  Multiple Vitamin (MULTIVITAMIN) tablet Take 1 tablet by mouth daily.    [provider]  NON FORMULARY OTC LION MAIN  Once a day    [provider]  pantoprazole  (PROTONIX ) 40 MG tablet TAKE 1 TABLET EVERY DAY 12/09/22   Plotnikov, Aleksei V, MD  polyethylene glycol (MIRALAX ) 17 g packet Take 17 g by mouth daily as needed. 12/12/23   Craig Alan SAUNDERS, PA-C  potassium chloride  SA (KLOR-CON  M) 20 MEQ tablet Take 1 tablet (20 mEq total) by mouth daily. 01/07/22   Plotnikov, Aleksei V, MD  repaglinide  (PRANDIN ) 1 MG tablet Take 1 tablet (1 mg total) by mouth 3 (three) times daily before meals. 08/17/22   Plotnikov, Aleksei V, MD  triamterene -hydrochlorothiazide (MAXZIDE-25) 37.5-25 MG tablet TAKE 1 TABLET EVERY DAY 12/09/22   Plotnikov, Aleksei V, MD  trolamine salicylate (ASPERCREME) 10 % cream Apply 1 application topically 2 (two) times daily as needed for muscle pain.    [provider]    Family History Family History  Problem Relation Age of Onset   Stroke Mother    Hypertension Mother    Lung cancer Mother    Cancer Father        ? liver ca   Alcohol abuse Father    Hypertension Other    Stroke Other    Colon cancer Neg Hx    Colon polyps Neg Hx    Rectal cancer Neg Hx    Stomach cancer Neg Hx    Esophageal cancer Neg Hx     Social History Social History   Tobacco Use   Smoking status: Former    Current packs/day: 0.00    Types: Cigarettes    Quit date: 1979    Years  since quitting: 46.5   Smokeless tobacco: Never  Vaping Use   Vaping status: Never Used  Substance Use Topics   Alcohol use: Not Currently    Comment: occasional   Drug use: No     Allergies   Amlodipine , Benazepril , Ibuprofen , Losartan , and Lovastatin    Review of Systems Review of Systems Per HPI  Physical Exam Triage Vital Signs ED Triage Vitals [02/27/24 1409]  Encounter Vitals Group     BP 124/83     Girls Systolic BP  Percentile      Girls Diastolic BP Percentile      Boys Systolic BP Percentile      Boys Diastolic BP Percentile      Pulse Rate 93     Resp 18     Temp 98 F (36.7 C)     Temp Source Oral     SpO2 95 %     Weight      Height      Head Circumference      Peak Flow      Pain Score 2     Pain Loc      Pain Education      Exclude from Growth Chart    No data found.  Updated Vital Signs BP 124/83 (BP Location: Left Arm)   Pulse 93   Temp 98 F (36.7 C) (Oral)   Resp 18   SpO2 95%   Visual Acuity Right Eye Distance:   Left Eye Distance:   Bilateral Distance:    Right Eye Near:   Left Eye Near:    Bilateral Near:     Physical Exam Vitals and nursing note reviewed.  Constitutional:      Appearance: She is not ill-appearing or toxic-appearing.  HENT:     Head: Normocephalic and atraumatic.     Jaw: There is normal jaw occlusion.     Right Ear: Hearing and external ear normal.     Left Ear: Hearing and external ear normal.     Nose: Nose normal.     Mouth/Throat:     Lips: Pink.  Eyes:     General: Lids are normal. Vision grossly intact. Gaze aligned appropriately.     Extraocular Movements: Extraocular movements intact.     Conjunctiva/sclera: Conjunctivae normal.  Pulmonary:     Effort: Pulmonary effort is normal.  Musculoskeletal:     Cervical back: Neck supple.  Lymphadenopathy:     Cervical: Cervical adenopathy present.  Skin:    General: Skin is warm and dry.     Capillary Refill: Capillary refill takes less than 2 seconds.     Findings: Wound present.      Neurological:     General: No focal deficit present.     Mental Status: She is alert and oriented to person, place, and time. Mental status is at baseline.     Cranial Nerves: No dysarthria or facial asymmetry.  Psychiatric:        Mood and Affect: Mood normal.        Speech: Speech normal.        Behavior: Behavior normal.        Thought Content: Thought content normal.        Judgment:  Judgment normal.    Occipital scalp wound   UC Treatments / Results  Labs (all labs ordered are listed, but only abnormal results are displayed) Labs Reviewed - No data to display  EKG   Radiology No results found.  Procedures Procedures (including critical care time)  Medications Ordered in UC Medications - No data to display  Initial Impression / Assessment and Plan / UC Course  I have reviewed the triage vital signs and the nursing notes.  Pertinent labs & imaging results that were available during my care of the patient were reviewed by me and considered in my medical decision making (see chart for details).   1.  Open wound of scalp, type 2 diabetes with other skin complication without long-term use of insulin Open wound of scalp is infected likely due to recent reaction to glue used for patient's wig.  The portion of the leg affecting the skin of the scalp has been cut away, the other skin of the scalp is protected by a underlying cap.  No systemic signs of infection, vital signs are hemodynamically stable. History of CKD, creatinine clearance is 34 mL/min based on most recent creatinine from CMP drawn on December 12, 2023.  I would like to treat infected open scalp wound with doxycycline  twice daily for 7 days and cephalexin  500 mg twice daily for 7 days.  Topical mupirocin  twice daily for 7 days additionally ordered.  No clinical indication for renal dosing for either antibiotic based on creatinine clearance per up-to-date. Warm compresses encouraged. Infection return precautions discussed. She has reliable follow-up with her PCP at [facility] and has been encouraged to follow-up in the next 3 to 5 days.  Counseled patient on potential for adverse effects with medications prescribed/recommended today, strict ER and return-to-clinic precautions discussed, patient verbalized understanding.    Final Clinical Impressions(s) / UC Diagnoses   Final  diagnoses:  Open wound of scalp, unspecified open wound type, initial encounter  Type 2 diabetes mellitus with other skin complication, without long-term current use of insulin (HCC)     Discharge Instructions      Take antibiotics as prescribed every 12 hours for the next 7 days. Take with food. Apply mupirocin  ointment to the scalp every 12 hours for the next 7 days. Warm compresses. Wash with antibacterial soap every 12 hours, do not scrub as this will cause further irritation to the wound.  Please schedule a follow-up appointment with your PCP in the next 2 to 3 days.  If you develop any new or worsening symptoms or if your symptoms do not start to improve, please return here or follow-up with your primary care provider. If your symptoms are severe, please go to the emergency room.    ED Prescriptions     Medication Sig Dispense Auth. Provider   cephALEXin  (KEFLEX ) 500 MG capsule Take 1 capsule (500 mg total) by mouth 2 (two) times daily for 7 days. 14 capsule Enedelia Going M, FNP   doxycycline  (VIBRAMYCIN ) 100 MG capsule Take 1 capsule (100 mg total) by mouth 2 (two) times daily. 20 capsule Ronte Parker M, FNP   mupirocin  ointment (BACTROBAN ) 2 % Apply 1 Application topically 2 (two) times daily. 22 g Enedelia Going HERO, FNP      PDMP not reviewed this encounter.   Enedelia Going HERO, [place] 02/27/24 [phone]

## 2024-02-27 NOTE — ED Triage Notes (Signed)
 Pt c/o tender, red, sore, drainage from a rash to bottom of scalp from a hair piece since Friday.

## 2024-03-14 ENCOUNTER — Ambulatory Visit: Admitting: Internal Medicine

## 2024-03-16 ENCOUNTER — Encounter: Payer: Self-pay | Admitting: Internal Medicine

## 2024-03-16 ENCOUNTER — Ambulatory Visit (INDEPENDENT_AMBULATORY_CARE_PROVIDER_SITE_OTHER): Admitting: Internal Medicine

## 2024-03-16 VITALS — BP 127/75 | HR 85 | Temp 97.8°F | Ht 61.0 in | Wt 162.0 lb

## 2024-03-16 DIAGNOSIS — L03221 Cellulitis of neck: Secondary | ICD-10-CM | POA: Diagnosis not present

## 2024-03-16 DIAGNOSIS — L231 Allergic contact dermatitis due to adhesives: Secondary | ICD-10-CM

## 2024-03-16 DIAGNOSIS — K219 Gastro-esophageal reflux disease without esophagitis: Secondary | ICD-10-CM

## 2024-03-16 DIAGNOSIS — E785 Hyperlipidemia, unspecified: Secondary | ICD-10-CM

## 2024-03-16 DIAGNOSIS — I10 Essential (primary) hypertension: Secondary | ICD-10-CM | POA: Diagnosis not present

## 2024-03-16 MED ORDER — PANTOPRAZOLE SODIUM 40 MG PO TBEC: 40.0000 mg | DELAYED_RELEASE_TABLET | Freq: Every day | ORAL | 3 refills | Status: DC

## 2024-03-16 NOTE — Progress Notes (Signed)
 Subjective:  Patient ID: Monica Horn, female    DOB: 09-30-1945  Age: 78 y.o. MRN: [id number]  CC: Medical Management of Chronic Issues (Pt is here to f/u on recent UC visit )   HPI Tayvia J Aguillard presents for scalp skin infection on 02/27/24 f/u.  It has resolved.  Follow-up on dyslipidemia, hypertension, GERD  Outpatient Medications Prior to Visit  Medication Sig Dispense Refill   aspirin 81 MG chewable tablet Chew by mouth daily.     cetirizine  (ZYRTEC ) 10 MG tablet Take 1 tablet (10 mg total) by mouth daily as needed. 90 tablet 3   cholecalciferol (VITAMIN D3) 10 MCG (400 UNIT) TABS tablet Take 1,000 Units by mouth daily.     doxycycline  (VIBRAMYCIN ) 100 MG capsule Take 1 capsule (100 mg total) by mouth 2 (two) times daily. 20 capsule 0   fluticasone  (FLONASE ) 50 MCG/ACT nasal spray Use 2 spray(s) in each nostril once daily 16 g 5   hydrocortisone  (ANUSOL -HC) 25 MG suppository Place 1 suppository (25 mg total) rectally 2 (two) times daily. 12 suppository 0   Multiple Vitamin (MULTIVITAMIN) tablet Take 1 tablet by mouth daily.     mupirocin  ointment (BACTROBAN ) 2 % Apply 1 Application topically 2 (two) times daily. 22 g 0   NON FORMULARY OTC LION MAIN  Once a day     polyethylene glycol (MIRALAX ) 17 g packet Take 17 g by mouth daily as needed.     potassium chloride  SA (KLOR-CON  M) 20 MEQ tablet Take 1 tablet (20 mEq total) by mouth daily. 90 tablet 3   repaglinide  (PRANDIN ) 1 MG tablet Take 1 tablet (1 mg total) by mouth 3 (three) times daily before meals. 270 tablet 3   triamterene -hydrochlorothiazide (MAXZIDE-25) 37.5-25 MG tablet TAKE 1 TABLET EVERY DAY 90 tablet 3   trolamine salicylate (ASPERCREME) 10 % cream Apply 1 application topically 2 (two) times daily as needed for muscle pain.     pantoprazole  (PROTONIX ) 40 MG tablet TAKE 1 TABLET EVERY DAY 90 tablet 3   furosemide  (LASIX ) 20 MG tablet TAKE 1 TO 2 TABLETS EVERY DAY (Patient not taking: Reported on 03/16/2024) 180 tablet  3   No facility-administered medications prior to visit.    ROS: Review of Systems  Constitutional:  Negative for activity change, appetite change, chills, fatigue and unexpected weight change.  HENT:  Negative for congestion, mouth sores and sinus pressure.   Eyes:  Negative for visual disturbance.  Respiratory:  Negative for cough and chest tightness.   Gastrointestinal:  Negative for abdominal pain and nausea.  Genitourinary:  Negative for difficulty urinating, frequency and vaginal pain.  Musculoskeletal:  Negative for back pain and gait problem.  Skin:  Negative for pallor and rash.  Neurological:  Negative for dizziness, tremors, weakness, numbness and headaches.  Psychiatric/Behavioral:  Negative for confusion, sleep disturbance and suicidal ideas. The patient is not nervous/anxious.     Objective:  BP 127/75   Pulse 85   Temp 97.8 F (36.6 C) (Oral)   Ht 5' 1 (1.549 m)   Wt 162 lb (73.5 kg)   SpO2 99%   BMI 30.61 kg/m   BP Readings from Last 3 Encounters:  03/16/24 127/75  02/27/24 124/83  02/09/24 114/70    Wt Readings from Last 3 Encounters:  03/16/24 162 lb (73.5 kg)  02/09/24 166 lb (75.3 kg)  12/13/23 167 lb 6.4 oz (75.9 kg)    Physical Exam Constitutional:      General: She is  not in acute distress.    Appearance: She is well-developed.  HENT:     Head: Normocephalic.     Right Ear: External ear normal.     Left Ear: External ear normal.     Nose: Nose normal.  Eyes:     General:        Right eye: No discharge.        Left eye: No discharge.     Conjunctiva/sclera: Conjunctivae normal.     Pupils: Pupils are equal, round, and reactive to light.  Neck:     Thyroid : No thyromegaly.     Vascular: No JVD.     Trachea: No tracheal deviation.  Cardiovascular:     Rate and Rhythm: Normal rate and regular rhythm.     Heart sounds: Normal heart sounds.  Pulmonary:     Effort: No respiratory distress.     Breath sounds: No stridor. No wheezing.   Abdominal:     General: Bowel sounds are normal. There is no distension.     Palpations: Abdomen is soft. There is no mass.     Tenderness: There is no abdominal tenderness. There is no guarding or rebound.  Musculoskeletal:        General: No tenderness.     Cervical back: Normal range of motion and neck supple. No rigidity.  Lymphadenopathy:     Cervical: No cervical adenopathy.  Skin:    Findings: No erythema or rash.  Neurological:     Cranial Nerves: No cranial nerve deficit.     Motor: No abnormal muscle tone.     Coordination: Coordination normal.     Deep Tendon Reflexes: Reflexes normal.  Psychiatric:        Behavior: Behavior normal.        Thought Content: Thought content normal.        Judgment: Judgment normal.     Lab Results  Component Value Date   WBC 5.9 12/12/2023   HGB 12.9 12/12/2023   HCT 41.3 12/12/2023   PLT 337.0 12/12/2023   GLUCOSE 128 (H) 12/12/2023   CHOL 222 (H) 11/16/2022   TRIG 273.0 (H) 11/16/2022   HDL 40.40 11/16/2022   LDLDIRECT 124.0 11/16/2022   LDLCALC 130 (H) 06/24/2017   ALT 23 12/12/2023   AST 18 12/12/2023   NA 140 12/12/2023   K 3.1 (L) 12/12/2023   CL 101 12/12/2023   CREATININE 1.28 (H) 12/12/2023   BUN 18 12/12/2023   CO2 32 12/12/2023   TSH 1.61 01/06/2022   INR 0.9 10/13/2018   HGBA1C 6.3 05/19/2023    No results found.  Assessment & Plan:   Problem List Items Addressed This Visit     Cellulitis - Primary   Posterior neck cellulitis complicating contact dermatitis from the with glue-resolved      Contact dermatitis   Posterior neck cellulitis complicated contact dermatitis from the with glue-resolved      Dyslipidemia   On diet Use fish oil      GERD (gastroesophageal reflux disease)   Continue on Protonix       Relevant Medications   pantoprazole  (PROTONIX ) 40 MG tablet   HTN (hypertension)   Cont on Maxzide         Meds ordered this encounter  Medications   pantoprazole  (PROTONIX ) 40  MG tablet    Sig: Take 1 tablet (40 mg total) by mouth daily.    Dispense:  90 tablet    Refill:  3  Follow-up: Return in about 3 months (around 06/16/2024) for a follow-up visit.  Marolyn Noel, MD

## 2024-03-25 DIAGNOSIS — L259 Unspecified contact dermatitis, unspecified cause: Secondary | ICD-10-CM | POA: Insufficient documentation

## 2024-03-25 NOTE — Assessment & Plan Note (Signed)
 Posterior neck cellulitis complicating contact dermatitis from the with glue-resolved

## 2024-03-25 NOTE — Assessment & Plan Note (Signed)
 Posterior neck cellulitis complicated contact dermatitis from the with glue-resolved

## 2024-03-25 NOTE — Assessment & Plan Note (Signed)
On diet Use fish oil

## 2024-03-25 NOTE — Assessment & Plan Note (Signed)
 Continue on Protonix

## 2024-03-25 NOTE — Assessment & Plan Note (Signed)
Cont on Maxzide 

## 2024-03-26 ENCOUNTER — Other Ambulatory Visit: Payer: Self-pay | Admitting: Internal Medicine

## 2024-03-26 DIAGNOSIS — M159 Polyosteoarthritis, unspecified: Secondary | ICD-10-CM

## 2024-03-26 DIAGNOSIS — M25472 Effusion, left ankle: Secondary | ICD-10-CM

## 2024-04-09 ENCOUNTER — Ambulatory Visit: Payer: Self-pay

## 2024-04-09 NOTE — Telephone Encounter (Signed)
 FYI Only or Action Required?: Asking for prescription  Patient was last seen in primary care on 03/16/2024 by Plotnikov, Karlynn GAILS, MD.  Called Nurse Triage reporting [facility].  Symptoms began several weeks ago.  Interventions attempted: OTC medications: hydrocortisone .  Symptoms are: unchanged. States my doctor just calls in triamcinolone  cream for me, I don't have to come in. Declines OV.  Triage Disposition: See Physician Within 24 Hours  Patient/caregiver understands and will follow disposition?: No, wishes to speak with PCP   Reason for Disposition  MODERATE to SEVERE itching (e.g., interferes with work, school, sleep, or other activities)  Answer Assessment - Initial Assessment Questions 1. APPEARANCE of RASH: What does the rash look like?      Red, weepy rash 2. LOCATION: Where is the rash located?  (e.g., face, genitals, hands, legs)     Arms,legs, side 3. SIZE: How large is the rash?      moderate 4. ONSET: When did the rash begin?      2 weeks 5. ITCHING: Does the rash itch? If Yes, ask: How bad is it?     yes 6. EXPOSURE:  How were you exposed to the plant (poison ivy, poison oak, sumac)  When were you exposed?  Note: Sometimes a poison ivy/oak/sumac rash does not appear for 2 to 3 weeks after exposure.      yes 7. PAST HISTORY: Have you had a poison ivy rash before? If Yes, ask: How bad was it?     yes 8. OTHER SYMPTOMS: Do you have any other symptoms? (e.g., fever)      no 9. PREGNANCY: Is there any chance you are pregnant? When was your last menstrual period?     no  Protocols used: Poison Ivy - Oak - Comprehensive Outpatient Surge

## 2024-04-10 ENCOUNTER — Ambulatory Visit: Admitting: Gastroenterology

## 2024-04-10 ENCOUNTER — Other Ambulatory Visit: Payer: Self-pay | Admitting: Internal Medicine

## 2024-04-10 ENCOUNTER — Encounter: Payer: Self-pay | Admitting: Gastroenterology

## 2024-04-10 VITALS — BP 114/76 | HR 96 | Ht 61.0 in | Wt 163.0 lb

## 2024-04-10 DIAGNOSIS — K625 Hemorrhage of anus and rectum: Secondary | ICD-10-CM | POA: Insufficient documentation

## 2024-04-10 DIAGNOSIS — K5900 Constipation, unspecified: Secondary | ICD-10-CM | POA: Insufficient documentation

## 2024-04-10 DIAGNOSIS — Z8619 Personal history of other infectious and parasitic diseases: Secondary | ICD-10-CM | POA: Diagnosis not present

## 2024-04-10 DIAGNOSIS — K219 Gastro-esophageal reflux disease without esophagitis: Secondary | ICD-10-CM

## 2024-04-10 MED ORDER — TRIAMCINOLONE ACETONIDE 0.5 % EX CREA
1.0000 | TOPICAL_CREAM | Freq: Three times a day (TID) | CUTANEOUS | 2 refills | Status: AC
Start: 2024-04-10 — End: 2025-04-10

## 2024-04-10 NOTE — Progress Notes (Addendum)
 04/10/2024 Zell J Fortenberry [id number] 11/30/1945   HISTORY OF PRESENT ILLNESS: This is a 78 year old female is a patient of Dr. Andy.  She is here today for follow-up after her colonoscopy and endoscopy procedures in June.  In regards to constipation she says that she is using MiraLAX  or Dulcolax on an as-needed basis.  Says that she has hydrocortisone  suppositories to use for hemorrhoidal bleeding, but admits that she has not used them regularly.  Not currently having any bleeding.  In regards to her acid reflux she is taking pantoprazole  40 mg daily and is doing well with that.  Colonoscopy 01/2024:  - Hemorrhoids found on perianal exam. - Decreased sphincter tone found on digital rectal exam. - One 4 mm polyp in the ascending colon, removed with a cold biopsy forceps. Resected and retrieved. - A tattoo was seen at the hepatic flexure. The tattoo site appeared normal. - One small lipoma in the ascending colon - A few small diverticula are seen in the sigmoid colon. - Internal hemorrhoids. Internal and external hemorrhoids may explain the patient' s rectal bleeding  EGD 01/2024:  - Normal esophagus. - Gastritis. Biopsied. Clips were placed to treat oozing status post biopsy - Normal antrum, cardia and gastric fundus. Biopsied. - Normal duodenal bulb and second portion of the duodenum.  FINAL DIAGNOSIS        1. Surgical [P], gastric :       -  ANTRAL AND OXYNTIC MUCOSA WITH MILD CHRONIC INACTIVE GASTRITIS AND       CHEMICAL/REACTIVE CHANGE.       -  NO HELICOBACTER PYLORI ORGANISMS IDENTIFIED ON H&E STAINED SLIDE.        2. Surgical [P], colon, ascending, polyp (1) :       -  HYPERPLASTIC/SERRATED POLYP WITH MILD FEATURES SUGGESTIVE OF A SESSILE       SERRATED LESION AND ASSOCIATED PROMINENT LYMPHOID AGGREGATE.    Past Medical History:  Diagnosis Date   Abnormal EKG 2014   NONSPECIFIC ST DEPRESSION + T ANORMALITY, WORKED UP 2014 DR CATHLYN BIRMINGHAM AND RELEASED BY DR BIRMINGHAM   Acute  sinusitis 07/01/2021   11/22, 2/23 Omnicef   Augmentin  bid x 10 d   Acute tonsillitis 08/05/2008   Allergic rhinitis 11/11/2015   3//17 Zyrtec   Use Sinus rinse - given   Allergy    seasonal   Anemia, unspecified 10/27/2007   Ankle swelling 04/23/2019   Asthma    PRN inhalers   Blood in stool 10/27/2007   Recurrent   Chronic   Dr Aneita  Colon is due this fall 2019   Blood transfusion without reported diagnosis    Carpal tunnel syndrome 10/27/2007   R>L  She had L wrist operated on      Cataract    Cerumen impaction 12/01/2009   B wax      Chest pain, atypical 08/26/2012   Chronic knee pain after total replacement of right knee joint 04/21/2022   Closed undisplaced fracture of nasal bone 12/09/2016   Colon polyps    Cystitis 09/08/2009   Dizziness 04/04/2009   Dog bite 09/27/2022   Face 09/2022  S/p Rabies shots   Dyslipidemia 06/20/2007   Chronic   Statin intolerant  On diet  Use fish oil   Eruption cyst 04/05/2014   Fatty infiltration of liver    Finger contusion 11/30/2010   R 4th   Foot pain 06/13/2012   2020 L dorsal pain - OA/capsulitis  2021 L  ankle pain since the Fall of 2020 - saw dr Magdalen, was wearing a boot in may 2021  Inflammatory tendinitis left posterior tib along with flatfoot deformity which is contributory towards the problem  6/21 Treat edema of LEs  Medrol  pac  Sports med ref if not better   Forehead contusion 11/19/2016   C/o hitting a cabinet after chocking on ice - fell; ?passed out at the client's house November 04 2016; had a hematoma on the central forehead. She had a HA x1d. She had a black eye later. No n/v.  Nose bones X ray IMPRESSION:  Depressed anterior nasal bone fractures bilaterally.     Mild swelling of the nasal septal soft tissues.        Electronically Signed    By: Selinda DELENA Blue M.D.    On: 04/12/201   Ganglion cyst 11/11/2015   L wrist tender 1 cm ganglion 3/17  Dr Camella  ?L foot 2020   Gastric polyp 07/18/2018   GERD (gastroesophageal  reflux disease)    Hematochezia 10/27/2007   Hemorrhage of gastrointestinal tract 10/27/2007   Hemorrhoids 10/27/2007   History of COVID-19 09/01/2019   History of revision of total replacement of left hip joint 11/17/2018   HTN (hypertension) 07/11/2007   Chronic - on Norvasc  - d/c (swelling)  D/c Benazepril  due to cough 6/16 - restarted 8/16  D/c Losartan  due to HA  9/20 - Cont on Maxzide   Hydradenitis 01/2009   Right S aureus   Ingrown right big toenail 09/27/2014   Near syncope 10/13/2012   2/14  Abn ECHO   OA (osteoarthritis) of hip 10/18/2018   Osteoarthritis of right knee 04/25/2022   Otitis externa 07/27/2010   2020  Corisporin      Rash and nonspecific skin eruption 04/05/2014   8/15 recurrent - R inner thigh  ?herpes - pt decline Rx   Shoulder pain, right 11/30/2010   R rot cuff strain, repeat 2023  Arnica gel or cream on bruises     S/p PT at [facility]  Blue-Emu cream was recommended  2-3 times a day   Sinus infection 10/02/2018   Statin intolerance 07/29/2020   Tennis elbow 01/13/2021   5/22 L  Ice, Voltaren gel, massage   Tubulovillous adenoma polyp of colon 03/2007   w/ focal high grade dysplasia, no carcinoma   Type II diabetes mellitus 06/12/2007   Diet controlled  CT ca scoring info given 12/19, advised     Not taking Repaginate - re-start  Risks associated with treatment noncompliance were discussed. Compliance was encouraged.  Re-start Rybelsus  - use Assistance program, if covered - too $$$   URI, acute 06/11/2011   10/12, 10/13, 4/15, 11/15, 3/16, 6/16, 2/20, 1/21   UTI (urinary tract infection) 01/04/2014   Past Surgical History:  Procedure Laterality Date   CARPAL TUNNEL RELEASE     Lt  wrist, rt wrist 2014   COLONOSCOPY     POLYPECTOMY     TOTAL HIP ARTHROPLASTY Left 10/18/2018   Procedure: TOTAL HIP ARTHROPLASTY ANTERIOR APPROACH;  Surgeon: Melodi Lerner, MD;  Location: [facility];  Service: Orthopedics;  Laterality: Left;    TUBAL LIGATION       reports that she quit smoking about 46 years ago. Her smoking use included cigarettes. She has never used smokeless tobacco. She reports that she does not currently use alcohol. She reports that she does not use drugs. family history includes Alcohol abuse in her father; Cancer in her  father; Hypertension in her mother and another family member; Lung cancer in her mother; Stroke in her mother and another family member. Allergies  Allergen Reactions   Amlodipine  Other (See Comments)    Leg swelling   Benazepril  Cough   Ibuprofen  Other (See Comments)    gastritis   Losartan  Other (See Comments)    Headache   Lovastatin      myalgia      Outpatient Encounter Medications as of 04/10/2024  Medication Sig   aspirin 81 MG chewable tablet Chew by mouth daily.   cetirizine  (ZYRTEC ) 10 MG tablet Take 1 tablet (10 mg total) by mouth daily as needed.   cholecalciferol (VITAMIN D3) 10 MCG (400 UNIT) TABS tablet Take 1,000 Units by mouth daily.   doxycycline  (VIBRAMYCIN ) 100 MG capsule Take 1 capsule (100 mg total) by mouth 2 (two) times daily.   fluticasone  (FLONASE ) 50 MCG/ACT nasal spray Use 2 spray(s) in each nostril once daily   furosemide  (LASIX ) 20 MG tablet TAKE 1 TO 2 TABLETS EVERY DAY   hydrocortisone  (ANUSOL -HC) 25 MG suppository Place 1 suppository (25 mg total) rectally 2 (two) times daily.   Multiple Vitamin (MULTIVITAMIN) tablet Take 1 tablet by mouth daily.   mupirocin  ointment (BACTROBAN ) 2 % Apply 1 Application topically 2 (two) times daily.   NON FORMULARY OTC LION MAIN  Once a day   pantoprazole  (PROTONIX ) 40 MG tablet TAKE 1 TABLET EVERY DAY   polyethylene glycol (MIRALAX ) 17 g packet Take 17 g by mouth daily as needed.   potassium chloride  SA (KLOR-CON  M) 20 MEQ tablet Take 1 tablet (20 mEq total) by mouth daily.   repaglinide  (PRANDIN ) 1 MG tablet Take 1 tablet (1 mg total) by mouth 3 (three) times daily before meals.   triamcinolone  cream (KENALOG ) 0.5 % Apply 1 Application  topically 3 (three) times daily.   triamterene -hydrochlorothiazide (MAXZIDE-25) 37.5-25 MG tablet TAKE 1 TABLET EVERY DAY   trolamine salicylate (ASPERCREME) 10 % cream Apply 1 application topically 2 (two) times daily as needed for muscle pain.   No facility-administered encounter medications on file as of 04/10/2024.    REVIEW OF SYSTEMS  : All other systems reviewed and negative except where noted in the History of Present Illness.   PHYSICAL EXAM: BP 114/76   Pulse 96   Ht 5' 1 (1.549 m)   Wt 163 lb (73.9 kg)   BMI 30.80 kg/m  General: Well developed AA female in no acute distress Head: Normocephalic and atraumatic Eyes:  Sclerae anicteric, conjunctiva pink. Ears: Normal auditory acuity Musculoskeletal: Symmetrical with no gross deformities  Skin: No lesions on visible extremities Neurological: Alert oriented x 4, grossly non-focal Psychological:  Alert and cooperative. Normal mood and affect  ASSESSMENT AND PLAN: *Constipation: Currently using MiraLAX  or Dulcolax as needed, which is fine for now.  Certainly can consider using MiraLAX  on a daily basis if constipation worsens.  Need to keep stools soft and moving well to prevent recurrent hemorrhoidal bleeding/irritation. *Rectal bleeding: From internal hemorrhoids.  Has hydrocortisone  suppositories.  Has not used them consistently for any period time, but can at least use them at bedtime for a few days if bleeding is recurrent. *History of H. Pylori: History of H. pylori in 2019 treated with Pylera.  Biopsies during most recent EGD negative for H. pylori.   CC:  Plotnikov, Karlynn GAILS, MD  I have reviewed the clinic note as outlined by Harlene Mail, PA and agree with the assessment, plan and medical decision making.  Ms. Radovich returns to the office today for follow-up of obstipation, rectal bleeding and history of H. pylori infection.  Recent EGD and colonoscopy were reassuring.  No evidence of residual H. Pylori on biopsies.   Colonoscopy revealed 1 hyperplastic/sessile serrated polyp.  Hemorrhoids were present.  Agree with MiraLAX  as needed for constipation.  If patient has recurrent issues with hemorrhoids can use hydrocortisone  suppositories.  No further surveillance colonoscopies recommended based upon age.  Follow-up as needed.  Inocente Hausen, MD

## 2024-04-10 NOTE — Progress Notes (Signed)
 Triamcinolone  prescription emailed

## 2024-04-10 NOTE — Patient Instructions (Signed)
 Follow up in a year or sooner if needed.  _______________________________________________________  If your blood pressure at your visit was 140/90 or greater, please contact your primary care physician to follow up on this.  _______________________________________________________  If you are age 77 or older, your body mass index should be between 23-30. Your Body mass index is 30.8 kg/m. If this is out of the aforementioned range listed, please consider follow up with your Primary Care Provider.  If you are age 5 or younger, your body mass index should be between 19-25. Your Body mass index is 30.8 kg/m. If this is out of the aformentioned range listed, please consider follow up with your Primary Care Provider.   ________________________________________________________  The [facility] providers would like to encourage you to use MYCHART to communicate with providers for non-urgent requests or questions.  Due to long hold times on the telephone, sending your provider a message by [facility] may be a faster and more efficient way to get a response.  Please allow 48 business hours for a response.  Please remember that this is for non-urgent requests.  _______________________________________________________  [facility] is using a team-based approach to care.  Your team is made up of your doctor and two to three APPS. Our APPS (Nurse Practitioners and Physician Assistants) work with your physician to ensure care continuity for you. They are fully qualified to address your health concerns and develop a treatment plan. They communicate directly with your gastroenterologist to care for you. Seeing the Advanced Practice Practitioners on your physician's team can help you by facilitating care more promptly, often allowing for earlier appointments, access to diagnostic testing, procedures, and other specialty referrals.

## 2024-04-10 NOTE — Telephone Encounter (Signed)
 Triamcinolone  prescription was emailed to the pharmacy.  Thanks

## 2024-04-15 ENCOUNTER — Encounter: Payer: Self-pay | Admitting: Pediatrics

## 2024-04-23 DIAGNOSIS — M25561 Pain in right knee: Secondary | ICD-10-CM | POA: Diagnosis not present

## 2024-04-23 DIAGNOSIS — M1711 Unilateral primary osteoarthritis, right knee: Secondary | ICD-10-CM | POA: Diagnosis not present

## 2024-04-23 NOTE — Progress Notes (Addendum)
 COVID Vaccine received:  []  No [x]  Yes Date of any COVID positive Test in last 90 days: no PCP - Karlynn Noel MD Cardiologist -   Chest x-ray -  EKG -   Stress Test -  ECHO - 09/12/12 Epic Cardiac Cath - 10/25/02 Epic  Bowel Prep - [x]  No  []   Yes ______  Pacemaker / ICD device [x]  No []  Yes   Spinal Cord Stimulator:[x]  No []  Yes       History of Sleep Apnea? [x]  No []  Yes   CPAP used?- [x]  No []  Yes    Does the patient monitor blood sugar?          [x]  No []  Yes  []  N/A  Patient has: []  NO Hx DM   []  Pre-DM                 []  DM1  [x]   DM2 Does patient have a [employer] or Dexacom? [x]  No []  Yes   Fasting Blood Sugar Ranges- 114-120 Checks Blood Sugar ____1_ times a month  GLP1 agonist / usual dose - no GLP1 instructions:  SGLT-2 inhibitors / usual dose - no SGLT-2 instructions:   Blood Thinner / Instructions:no Aspirin Instructions:ASA81 mg- will hold 5 days prior  Comments:   Activity level: Patient is able  to climb a flight of stairs without difficulty; [x]  No CP  [x]  No SOB,   Patient can perform ADLs without assistance.   Anesthesia review:   Patient denies shortness of breath, fever, cough and chest pain at PAT appointment.  Patient verbalized understanding and agreement to the Pre-Surgical Instructions that were given to them at this PAT appointment. Patient was also educated of the need to review these PAT instructions again prior to his/her surgery.I reviewed the appropriate phone numbers to call if they have any and questions or concerns.

## 2024-04-23 NOTE — Patient Instructions (Signed)
 SURGICAL WAITING ROOM VISITATION  Patients having surgery or a procedure may have no more than 2 support people in the waiting area - these visitors may rotate.    Children under the age of 62 must have an adult with them who is not the patient.  Visitors with respiratory illnesses are discouraged from visiting and should remain at home.  If the patient needs to stay at the hospital during part of their recovery, the visitor guidelines for inpatient rooms apply. Pre-op nurse will coordinate an appropriate time for 1 support person to accompany patient in pre-op.  This support person may not rotate.    Please refer to the [facility] website for the visitor guidelines for Inpatients (after your surgery is over and you are in a regular room).       Your procedure is scheduled on: 05/07/24   Report to [facility] Main Entrance    Report to admitting at 5:15 AM   Call this number if you have problems the morning of surgery [phone]   Do not eat food :After Midnight.   After Midnight you may have the following liquids until 4:15 AM DAY OF SURGERY  Water  Non-Citrus Juices (without pulp, NO RED-Apple, White grape, White cranberry) Black Coffee (NO MILK/CREAM OR CREAMERS, sugar ok)  Clear Tea (NO MILK/CREAM OR CREAMERS, sugar ok) regular and decaf                             Plain Jell-O (NO RED)                                           Fruit ices (not with fruit pulp, NO RED)                                     Popsicles (NO RED)                                                               Sports drinks like Gatorade (NO RED)                  The day of surgery:  Drink ONE (1) Pre-Surgery G2 at 4:15 AM the morning of surgery. Drink in one sitting. Do not sip.  This drink was given to you during your hospital  pre-op appointment visit. Nothing else to drink after completing the  Pre-Surgery G2.          I  Oral Hygiene is also important to reduce your risk of  infection.                                    Remember - BRUSH YOUR TEETH THE MORNING OF SURGERY WITH YOUR REGULAR TOOTHPASTE  DENTURES WILL BE REMOVED PRIOR TO SURGERY PLEASE DO NOT APPLY Poly grip OR ADHESIVES!!!   Stop all vitamins and herbal supplements 7 days before surgery.   Take these medicines the morning of surgery with A SIP OF  WATER : Cetirizine (Zyrtec ), Pantoprazole (Protonix ), nasal spray              Do not take Lasix  or Maxzide(triamterene /hydrochlorothiazide) the morning of surgery.  DO NOT TAKE ANY ORAL DIABETIC MEDICATIONS DAY OF YOUR SURGERY Do not take Prandin  the morning of surgery.              You may not have any metal on your body including hair pins, jewelry, and body piercing             Do not wear make-up, lotions, powders, perfumes/cologne, or deodorant  Do not wear nail polish including gel and S&S, artificial/acrylic nails, or any other type of covering on natural nails including finger and toenails. If you have artificial nails, gel coating, etc. that needs to be removed by a nail salon please have this removed prior to surgery or surgery may need to be canceled/ delayed if the surgeon/ anesthesia feels like they are unable to be safely monitored.   Do not shave  48 hours prior to surgery.    Do not bring valuables to the hospital. [place] IS NOT             RESPONSIBLE   FOR VALUABLES.   Contacts, glasses, dentures or bridgework may not be worn into surgery.   Bring small overnight bag day of surgery.   DO NOT BRING YOUR HOME MEDICATIONS TO THE HOSPITAL. PHARMACY WILL DISPENSE MEDICATIONS LISTED ON YOUR MEDICATION LIST TO YOU DURING YOUR ADMISSION IN THE HOSPITAL!    Patients discharged on the day of surgery will not be allowed to drive home.  Someone NEEDS to stay with you for the first 24 hours after anesthesia.   Special Instructions: Bring a copy of your healthcare power of attorney and living will documents the day of surgery if you  haven't scanned them before.              Please read over the following fact sheets you were given: IF YOU HAVE QUESTIONS ABOUT YOUR PRE-OP INSTRUCTIONS PLEASE CALL [phone] [place]   If you received a COVID test during your pre-op visit  it is requested that you wear a mask when out in public, stay away from anyone that may not be feeling well and notify your surgeon if you develop symptoms. If you test positive for Covid or have been in contact with anyone that has tested positive in the last 10 days please notify you surgeon.      Pre-operative 5 CHG Bath Instructions   You can play a key role in reducing the risk of infection after surgery. Your skin needs to be as free of germs as possible. You can reduce the number of germs on your skin by washing with CHG (chlorhexidine  gluconate) soap before surgery. CHG is an antiseptic soap that kills germs and continues to kill germs even after washing.   DO NOT use if you have an allergy to chlorhexidine /CHG or antibacterial soaps. If your skin becomes reddened or irritated, stop using the CHG and notify one of our RNs at [phone].   Please shower with the CHG soap starting 4 days before surgery using the following schedule:     Please keep in mind the following:  DO NOT shave, including legs and underarms, starting the day of your first shower.   You may shave your face at any point before/day of surgery.  Place clean sheets on your bed the day you start using CHG soap. Use a  clean washcloth (not used since being washed) for each shower. DO NOT sleep with pets once you start using the CHG.   CHG Shower Instructions:  If you choose to wash your hair and private area, wash first with your normal shampoo/soap.  After you use shampoo/soap, rinse your hair and body thoroughly to remove shampoo/soap residue.  Turn the water  OFF and apply about 3 tablespoons (45 ml) of CHG soap to a CLEAN washcloth.  Apply CHG soap ONLY FROM YOUR NECK DOWN TO  YOUR TOES (washing for 3-5 minutes)  DO NOT use CHG soap on face, private areas, open wounds, or sores.  Pay special attention to the area where your surgery is being performed.  If you are having back surgery, having someone wash your back for you may be helpful. Wait 2 minutes after CHG soap is applied, then you may rinse off the CHG soap.  Pat dry with a clean towel  Put on clean clothes/pajamas   If you choose to wear lotion, please use ONLY the CHG-compatible lotions on the back of this paper.     Additional instructions for the day of surgery: DO NOT APPLY any lotions, deodorants, cologne, or perfumes.   Put on clean/comfortable clothes.  Brush your teeth.  Ask your nurse before applying any prescription medications to the skin.      CHG Compatible Lotions   Aveeno Moisturizing lotion  Cetaphil Moisturizing Cream  Cetaphil Moisturizing Lotion  Clairol Herbal Essence Moisturizing Lotion, Dry Skin  Clairol Herbal Essence Moisturizing Lotion, Extra Dry Skin  Clairol Herbal Essence Moisturizing Lotion, Normal Skin  Curel Age Defying Therapeutic Moisturizing Lotion with Alpha Hydroxy  Curel Extreme Care Body Lotion  Curel Soothing Hands Moisturizing Hand Lotion  Curel Therapeutic Moisturizing Cream, Fragrance-Free  Curel Therapeutic Moisturizing Lotion, Fragrance-Free  Curel Therapeutic Moisturizing Lotion, Original Formula  Eucerin Daily Replenishing Lotion  Eucerin Dry Skin Therapy Plus Alpha Hydroxy Crme  Eucerin Dry Skin Therapy Plus Alpha Hydroxy Lotion  Eucerin Original Crme  Eucerin Original Lotion  Eucerin Plus Crme Eucerin Plus Lotion  Eucerin TriLipid Replenishing Lotion  Keri Anti-Bacterial Hand Lotion  Keri Deep Conditioning Original Lotion Dry Skin Formula Softly Scented  Keri Deep Conditioning Original Lotion, Fragrance Free Sensitive Skin Formula  Keri Lotion Fast Absorbing Fragrance Free Sensitive Skin Formula  Keri Lotion Fast Absorbing Softly Scented  Dry Skin Formula  Keri Original Lotion  Keri Skin Renewal Lotion Keri Silky Smooth Lotion  Keri Silky Smooth Sensitive Skin Lotion  Nivea Body Creamy Conditioning Oil  Nivea Body Extra Enriched Lotion  Nivea Body Original Lotion  Nivea Body Sheer Moisturizing Lotion Nivea Crme  Nivea Skin Firming Lotion  NutraDerm 30 Skin Lotion  NutraDerm Skin Lotion  NutraDerm Therapeutic Skin Cream  NutraDerm Therapeutic Skin Lotion  ProShield Protective Hand Cream   Incentive Spirometer  An incentive spirometer is a tool that can help keep your lungs clear and active. This tool measures how well you are filling your lungs with each breath. Taking long deep breaths may help reverse or decrease the chance of developing breathing (pulmonary) problems (especially infection) following: A long period of time when you are unable to move or be active. BEFORE THE PROCEDURE  If the spirometer includes an indicator to show your best effort, your nurse or respiratory therapist will set it to a desired goal. If possible, sit up straight or lean slightly forward. Try not to slouch. Hold the incentive spirometer in an upright position. INSTRUCTIONS FOR USE  Sit  on the edge of your bed if possible, or sit up as far as you can in bed or on a chair. Hold the incentive spirometer in an upright position. Breathe out normally. Place the mouthpiece in your mouth and seal your lips tightly around it. Breathe in slowly and as deeply as possible, raising the piston or the ball toward the top of the column. Hold your breath for 3-5 seconds or for as long as possible. Allow the piston or ball to fall to the bottom of the column. Remove the mouthpiece from your mouth and breathe out normally. Rest for a few seconds and repeat Steps 1 through 7 at least 10 times every 1-2 hours when you are awake. Take your time and take a few normal breaths between deep breaths. The spirometer may include an indicator to show your best  effort. Use the indicator as a goal to work toward during each repetition. After each set of 10 deep breaths, practice coughing to be sure your lungs are clear. If you have an incision (the cut made at the time of surgery), support your incision when coughing by placing a pillow or rolled up towels firmly against it. Once you are able to get out of bed, walk around indoors and cough well. You may stop using the incentive spirometer when instructed by your caregiver.  RISKS AND COMPLICATIONS Take your time so you do not get dizzy or light-headed. If you are in pain, you may need to take or ask for pain medication before doing incentive spirometry. It is harder to take a deep breath if you are having pain. AFTER USE Rest and breathe slowly and easily. It can be helpful to keep track of a log of your progress. Your caregiver can provide you with a simple table to help with this. If you are using the spirometer at home, follow these instructions: SEEK MEDICAL CARE IF:  You are having difficultly using the spirometer. You have trouble using the spirometer as often as instructed. Your pain medication is not giving enough relief while using the spirometer. You develop fever of 100.5 F (38.1 C) or higher. SEEK IMMEDIATE MEDICAL CARE IF:  You cough up bloody sputum that had not been present before. You develop fever of 102 F (38.9 C) or greater. You develop worsening pain at or near the incision site. MAKE SURE YOU:  Understand these instructions. Will watch your condition. Will get help right away if you are not doing well or get worse. Document Released: 12/13/2006 Document Revised: 10/25/2011 Document Reviewed: 02/13/2007 [facility] Patient Information 2014 [place], [place].How to Manage Your Diabetes Before and After Surgery  Why is it important to control my blood sugar before and after surgery? Improving blood sugar levels before and after surgery helps healing and can limit problems. A  way of improving blood sugar control is eating a healthy diet by:  Eating less sugar and carbohydrates  Increasing activity/exercise  Talking with your doctor about reaching your blood sugar goals High blood sugars (greater than 180 mg/dL) can raise your risk of infections and slow your recovery, so you will need to focus on controlling your diabetes during the weeks before surgery. Make sure that the doctor who takes care of your diabetes knows about your planned surgery including the date and location.  How do I manage my blood sugar before surgery? Check your blood sugar at least 4 times a day, starting 2 days before surgery, to make sure that the level is not  too high or low. Check your blood sugar the morning of your surgery when you wake up and every 2 hours until you get to the Short Stay unit. If your blood sugar is less than 70 mg/dL, you will need to treat for low blood sugar: Do not take insulin. Treat a low blood sugar (less than 70 mg/dL) with  cup of clear juice (cranberry or apple), 4 glucose tablets, OR glucose gel. Recheck blood sugar in 15 minutes after treatment (to make sure it is greater than 70 mg/dL). If your blood sugar is not greater than 70 mg/dL on recheck, call [phone] for further instructions. Report your blood sugar to the short stay nurse when you get to Short Stay.  If you are admitted to the hospital after surgery: Your blood sugar will be checked by the staff and you will probably be given insulin after surgery (instead of oral diabetes medicines) to make sure you have good blood sugar levels. The goal for blood sugar control after surgery is 80-180 mg/dL.   WHAT DO I DO ABOUT MY DIABETES MEDICATION?  Do not take oral diabetes medicines (pills) the morning of surgery.        Do not take Prandin  the morning of surgery.  Patient Signature:  Date:   Nurse Signature:  Date:   Reviewed and Endorsed by [facility] Patient Education Committee, August  2015

## 2024-04-24 ENCOUNTER — Encounter (HOSPITAL_COMMUNITY)
Admission: RE | Admit: 2024-04-24 | Discharge: 2024-04-24 | Disposition: A | Discharge status: Home / Self Care | Source: Ambulatory Visit | Attending: Anesthesiology | Admitting: Anesthesiology

## 2024-04-24 ENCOUNTER — Other Ambulatory Visit: Payer: Self-pay

## 2024-04-24 ENCOUNTER — Encounter (HOSPITAL_COMMUNITY): Payer: Self-pay | Admitting: Medical

## 2024-04-24 ENCOUNTER — Encounter (HOSPITAL_COMMUNITY): Payer: Self-pay

## 2024-04-24 VITALS — Ht 61.0 in | Wt 170.0 lb

## 2024-04-24 DIAGNOSIS — E119 Type 2 diabetes mellitus without complications: Secondary | ICD-10-CM

## 2024-04-24 DIAGNOSIS — I10 Essential (primary) hypertension: Secondary | ICD-10-CM

## 2024-04-24 DIAGNOSIS — Z01818 Encounter for other preprocedural examination: Secondary | ICD-10-CM

## 2024-04-24 NOTE — H&P (Signed)
 TOTAL KNEE ADMISSION H&P  Patient is being admitted for right total knee arthroplasty.  Subjective:  Chief Complaint: Right knee pain.  HPI: Monica Horn, 78 y.o. female has a history of pain and functional disability in the right knee due to arthritis and has failed non-surgical conservative treatments for greater than 12 weeks to include NSAID's and/or analgesics, corticosteriod injections, and activity modification. Onset of symptoms was gradual, starting several years ago with gradually worsening course since that time. The patient noted no past surgery on the right knee.  Patient currently rates pain in the right knee at 9 out of 10 with activity. Patient has night pain, worsening of pain with activity and weight bearing, pain with passive range of motion, and crepitus. Patient has evidence of bone-on-bone arthritis in the medial and patellofemoral compartments of the right knee by imaging studies. There is no active infection.  Patient Active Problem List   Diagnosis Date Noted   Rectal bleeding 04/10/2024   Constipation 04/10/2024   Contact dermatitis 03/25/2024   Knee pain, chronic 08/22/2023   Memory problem 02/15/2023   Osteoarthritis of right knee 04/25/2022   Chronic knee pain after total replacement of right knee joint 04/21/2022   Tennis elbow 01/13/2021   Statin intolerance 07/29/2020   History of COVID-19 10/04/2019   History of revision of total replacement of left hip joint 11/17/2018   History of total left hip replacement 11/17/2018   OA (osteoarthritis) of hip 10/18/2018   Gastric polyp 07/18/2018   Colon polyps 07/18/2018   Ganglion cyst 11/11/2015   Allergic rhinitis 11/11/2015   Eruption cyst 04/05/2014   Chest pain, atypical 08/26/2012   GERD (gastroesophageal reflux disease) 08/26/2012   Abnormal EKG 08/26/2012   Shoulder pain, right 11/30/2010   Otitis externa 07/27/2010   Dizziness 04/04/2009   Cellulitis 01/31/2009   Anemia, unspecified 10/27/2007    Carpal tunnel syndrome 10/27/2007   Hemorrhoids 10/27/2007   Hemorrhage of gastrointestinal tract 10/27/2007   Hematochezia 10/27/2007   HTN (hypertension) 07/11/2007   Dyslipidemia 06/20/2007   Type II diabetes mellitus 06/12/2007    Past Medical History:  Diagnosis Date   Abnormal EKG 2014   NONSPECIFIC ST DEPRESSION + T ANORMALITY, WORKED UP 2014 DR CATHLYN BIRMINGHAM AND RELEASED BY DR BIRMINGHAM   Acute sinusitis 07/01/2021   11/22, 2/23 Omnicef   Augmentin  bid x 10 d   Acute tonsillitis 08/05/2008   Allergic rhinitis 11/11/2015   3//17 Zyrtec   Use Sinus rinse - given   Allergy    seasonal   Anemia, unspecified 10/27/2007   Ankle swelling 04/23/2019   Asthma    PRN inhalers   Blood in stool 10/27/2007   Recurrent   Chronic   Dr Aneita  Colon is due this fall 2019   Blood transfusion without reported diagnosis    Carpal tunnel syndrome 10/27/2007   R>L  She had L wrist operated on      Cataract    Cerumen impaction 12/01/2009   B wax      Chest pain, atypical 08/26/2012   Chronic knee pain after total replacement of right knee joint 04/21/2022   Closed undisplaced fracture of nasal bone 12/09/2016   Colon polyps    Cystitis 09/08/2009   Dizziness 04/04/2009   Dog bite 09/27/2022   Face 09/2022  S/p Rabies shots   Dyslipidemia 06/20/2007   Chronic   Statin intolerant  On diet  Use fish oil   Eruption cyst 04/05/2014   Fatty infiltration of liver  Finger contusion 11/30/2010   R 4th   Foot pain 06/13/2012   2020 L dorsal pain - OA/capsulitis  2021 L ankle pain since the Fall of 2020 - saw dr Magdalen, was wearing a boot in may 2021  Inflammatory tendinitis left posterior tib along with flatfoot deformity which is contributory towards the problem  6/21 Treat edema of LEs  Medrol  pac  Sports med ref if not better   Forehead contusion 11/19/2016   C/o hitting a cabinet after chocking on ice - fell; ?passed out at the client's house November 04 2016; had a hematoma on the central  forehead. She had a HA x1d. She had a black eye later. No n/v.  Nose bones X ray IMPRESSION:  Depressed anterior nasal bone fractures bilaterally.     Mild swelling of the nasal septal soft tissues.        Electronically Signed    By: Selinda DELENA Blue M.D.    On: 04/12/201   Ganglion cyst 11/11/2015   L wrist tender 1 cm ganglion 3/17  Dr Camella  ?L foot 2020   Gastric polyp 07/18/2018   GERD (gastroesophageal reflux disease)    Hematochezia 10/27/2007   Hemorrhage of gastrointestinal tract 10/27/2007   Hemorrhoids 10/27/2007   History of COVID-19 09/01/2019   History of revision of total replacement of left hip joint 11/17/2018   HTN (hypertension) 07/11/2007   Chronic - on Norvasc  - d/c (swelling)  D/c Benazepril  due to cough 6/16 - restarted 8/16  D/c Losartan  due to HA  9/20 - Cont on Maxzide   Hydradenitis 01/2009   Right S aureus   Ingrown right big toenail 09/27/2014   Near syncope 10/13/2012   2/14  Abn ECHO   OA (osteoarthritis) of hip 10/18/2018   Osteoarthritis of right knee 04/25/2022   Otitis externa 07/27/2010   2020  Corisporin      Rash and nonspecific skin eruption 04/05/2014   8/15 recurrent - R inner thigh  ?herpes - pt decline Rx   Shoulder pain, right 11/30/2010   R rot cuff strain, repeat 2023  Arnica gel or cream on bruises     S/p PT at [facility]  Blue-Emu cream was recommended  2-3 times a day   Sinus infection 10/02/2018   Statin intolerance 07/29/2020   Tennis elbow 01/13/2021   5/22 L  Ice, Voltaren gel, massage   Tubulovillous adenoma polyp of colon 03/2007   w/ focal high grade dysplasia, no carcinoma   Type II diabetes mellitus 06/12/2007   Diet controlled  CT ca scoring info given 12/19, advised     Not taking Repaginate - re-start  Risks associated with treatment noncompliance were discussed. Compliance was encouraged.  Re-start Rybelsus  - use Assistance program, if covered - too $$$   URI, acute 06/11/2011   10/12, 10/13, 4/15, 11/15, 3/16, 6/16,  2/20, 1/21   UTI (urinary tract infection) 01/04/2014    Past Surgical History:  Procedure Laterality Date   CARPAL TUNNEL RELEASE     Lt  wrist, rt wrist 2014   COLONOSCOPY     POLYPECTOMY     TOTAL HIP ARTHROPLASTY Left 10/18/2018   Procedure: TOTAL HIP ARTHROPLASTY ANTERIOR APPROACH;  Surgeon: Melodi Lerner, MD;  Location: [facility];  Service: Orthopedics;  Laterality: Left;    TUBAL LIGATION      Prior to Admission medications   Medication Sig Start Date End Date Taking? Authorizing Provider  aspirin 81 MG chewable tablet Chew 81 mg  by mouth daily after breakfast.   Yes [provider]  cetirizine  (ZYRTEC ) 10 MG tablet Take 1 tablet (10 mg total) by mouth daily as needed. Patient taking differently: Take 10 mg by mouth in the morning. 08/06/21  Yes Plotnikov, Aleksei V, MD  cholecalciferol (VITAMIN D3) 10 MCG (400 UNIT) TABS tablet Take 1,000 Units by mouth in the morning.   Yes [provider]  fluticasone  (FLONASE ) 50 MCG/ACT nasal spray Use 2 spray(s) in each nostril once daily 08/03/23  Yes Plotnikov, Aleksei V, MD  furosemide  (LASIX ) 20 MG tablet TAKE 1 TO 2 TABLETS EVERY DAY Patient taking differently: Take 20 mg by mouth daily as needed (fluid retention.). 03/27/24  Yes Plotnikov, Monica GAILS, MD  hydrocortisone  (ANUSOL -HC) 25 MG suppository Place 1 suppository (25 mg total) rectally 2 (two) times daily. Patient taking differently: Place 25 mg rectally 2 (two) times daily as needed for hemorrhoids. 12/12/23  Yes Craig Alan SAUNDERS, PA-C  Multiple Vitamin (MULTIVITAMIN WITH MINERALS) TABS tablet Take 1 tablet by mouth in the morning.   Yes [provider]  NON FORMULARY Take 1 capsule by mouth in the morning. [place]   Yes [provider]  pantoprazole  (PROTONIX ) 40 MG tablet TAKE 1 TABLET EVERY DAY 03/27/24  Yes Plotnikov, Aleksei V, MD  polyethylene glycol (MIRALAX ) 17 g packet Take 17 g by mouth daily as needed. 12/12/23  Yes Craig Alan SAUNDERS, PA-C  potassium chloride  SA (KLOR-CON  M) 20 MEQ tablet Take 1 tablet (20 mEq total) by mouth daily. Patient taking differently: Take 10 mEq by mouth daily. 01/07/22  Yes Plotnikov, Monica GAILS, MD  repaglinide  (PRANDIN ) 1 MG tablet Take 1 tablet (1 mg total) by mouth 3 (three) times daily before meals. Patient taking differently: Take 1 mg by mouth in the morning. 08/17/22  Yes Plotnikov, Aleksei V, MD  triamcinolone  cream (KENALOG ) 0.5 % Apply 1 Application topically 3 (three) times daily. Patient taking differently: Apply 1 Application topically 3 (three) times daily as needed (poison oak). 04/10/24 04/10/25 Yes Plotnikov, Monica GAILS, MD  triamterene -hydrochlorothiazide (MAXZIDE-25) 37.5-25 MG tablet TAKE 1 TABLET EVERY DAY 03/27/24  Yes Plotnikov, Aleksei V, MD  trolamine salicylate (ASPERCREME) 10 % cream Apply 1 application topically 2 (two) times daily as needed for muscle pain.   Yes [provider]    Allergies  Allergen Reactions   Amlodipine  Other (See Comments)    Leg swelling   Benazepril  Cough   Ibuprofen  Other (See Comments)    gastritis   Losartan  Other (See Comments)    Headache   Lovastatin      myalgia    Social History   Socioeconomic History   Marital status: Married    Spouse name: Not on file   Number of children: 3   Years of education: 13   Highest education level: Some college, no degree  Occupational History   Occupation: Retired/  Tobacco Use   Smoking status: Former    Current packs/day: 0.00    Types: Cigarettes    Quit date: 1979    Years since quitting: 46.7   Smokeless tobacco: Never  Vaping Use   Vaping status: Never Used  Substance and Sexual Activity   Alcohol use: Not Currently    Comment: occasional   Drug use: No   Sexual activity: Yes  Other Topics Concern   Not on file  Social History Narrative   Rigth handed   Lives with husband   One story home   Occasionally  cafffeine   Social Drivers of Research scientist (physical sciences) Strain: Low Risk  (06/27/2023)   Overall Financial Resource Strain (CARDIA)    Difficulty of Paying Living Expenses: Not hard at all  Food Insecurity: No Food Insecurity (06/27/2023)   Hunger Vital Sign    Worried About Running Out of Food in the Last Year: Never true    Ran Out of Food in the Last Year: Never true  Transportation Needs: No Transportation Needs (06/27/2023)   PRAPARE - Administrator, Civil Service (Medical): No    Lack of Transportation (Non-Medical): No  Physical Activity: Unknown (06/27/2023)   Exercise Vital Sign    Days of Exercise per Week: 4 days    Minutes of Exercise per Session: Not on file  Stress: No Stress Concern Present (06/27/2023)   [employer] of Occupational Health - Occupational Stress Questionnaire    Feeling of Stress : Not at all  Social Connections: Unknown (06/27/2023)   Social Connection and Isolation Panel    Frequency of Communication with Friends and Family: More than three times a week    Frequency of Social Gatherings with Friends and Family: More than three times a week    Attends Religious Services: More than 4 times per year    Active Member of [employer] or Organizations: Yes    Attends Banker Meetings: More than 4 times per year    Marital Status: Not on file  Intimate Partner Violence: Not At Risk (06/27/2023)   Humiliation, Afraid, Rape, and Kick questionnaire    Fear of Current or Ex-Partner: No    Emotionally Abused: No    Physically Abused: No    Sexually Abused: No    Tobacco Use: Medium Risk (04/10/2024)   Patient History    Smoking Tobacco Use: Former    Smokeless Tobacco Use: Never    Passive Exposure: Not on file   Social History   Substance and Sexual Activity  Alcohol Use Not Currently   Comment: occasional    Family History  Problem Relation Age of Onset   Stroke Mother    Hypertension Mother    Lung cancer Mother    Cancer Father        ? liver ca   Alcohol abuse  Father    Hypertension Other    Stroke Other    Colon cancer Neg Hx    Colon polyps Neg Hx    Rectal cancer Neg Hx    Stomach cancer Neg Hx    Esophageal cancer Neg Hx     Review of Systems  Constitutional:  Negative for chills and fever.  HENT:  Negative for congestion, sore throat and tinnitus.   Eyes:  Negative for double vision, photophobia and pain.  Respiratory:  Negative for cough, shortness of breath and wheezing.   Cardiovascular:  Negative for chest pain, palpitations and orthopnea.  Gastrointestinal:  Negative for heartburn, nausea and vomiting.  Genitourinary:  Negative for dysuria, frequency and urgency.  Musculoskeletal:  Positive for joint pain.  Neurological:  Negative for dizziness, weakness and headaches.    Objective:  Physical Exam: Well nourished and well developed.  General: Alert and oriented x3, cooperative and pleasant, no acute distress.  Head: normocephalic, atraumatic, neck supple.  Eyes: EOMI.  Musculoskeletal:  Right Knee: No effusion, range of motion 0-125 degrees, slight varus, crepitus on range of motion, somewhat tender medially, no lateral tenderness or instability noted, antalgic gait pattern on the right.  Calves soft and nontender. Motor function intact in LE. Strength 5/5 LE bilaterally. Neuro: Distal pulses 2+. Sensation to light touch intact in LE.  Imaging Review Plain radiographs demonstrate severe degenerative joint disease of the right knee. The overall alignment is mild varus. The bone quality appears to be adequate for age and reported activity level.  Assessment/Plan:  End stage arthritis, right knee   The patient history, physical examination, clinical judgment of the provider and imaging studies are consistent with end stage degenerative joint disease of the right knee and total knee arthroplasty is deemed medically necessary. The treatment options including medical management, injection therapy arthroscopy and arthroplasty  were discussed at length. The risks and benefits of total knee arthroplasty were presented and reviewed. The risks due to aseptic loosening, infection, stiffness, patella tracking problems, thromboembolic complications and other imponderables were discussed. The patient acknowledged the explanation, agreed to proceed with the plan and consent was signed. Patient is being admitted for inpatient treatment for surgery, pain control, PT, OT, prophylactic antibiotics, VTE prophylaxis, progressive ambulation and ADLs and discharge planning. The patient is planning to be discharged home.   Patient's anticipated LOS is less than 2 midnights, meeting these requirements: - Lives within 1 hour of care - Has a competent adult at home to recover with post-op recover - NO history of  - Chronic pain requiring opioids  - Diabetes  - Coronary Artery Disease  - Heart failure  - Heart attack  - Stroke  - DVT/VTE  - Cardiac arrhythmia  - Respiratory Failure/COPD  - Renal failure  - Anemia  - Advanced Liver disease  Therapy Plans: Outpatient therapy at [facility]) Disposition: Home with husband Planned DVT Prophylaxis: Aspirin 81 mg BID (takes 81 QD baseline) DME Needed: Vannie PCP: Monica Noel, MD (clearance received) TXA: IV Allergies: Ibuprofen  (gastritis), multiple antiHTNs Metal Allergy: None Anesthesia Concerns: None BMI: 32.4 Last HgbA1c: Not diabetic Pain Regimen: Oxycodone, tramadol  Pharmacy: [facility])  - Patient was instructed on what medications to stop prior to surgery. - Follow-up visit in 2 weeks with Dr. Melodi - Begin physical therapy following surgery - Pre-operative lab work as pre-surgical testing - Prescriptions will be provided in hospital at time of discharge  Roxie Mess, PA-C Orthopedic Surgery [facility] [place]

## 2024-04-24 NOTE — Progress Notes (Signed)
 Pt. Missed PST appointment. She was able to do phone interview. Pt. Identified herself by name and DOB. Pt. Answered all questions. Pre op instructions given to pt. She verbalized understanding. She will come to [facility] for pre op labs where she can pick up her pre op joint replacement booklet , antibacterial soap, pre op G2 and instructions.She will also do pre admit at that time.

## 2024-04-30 ENCOUNTER — Encounter (HOSPITAL_COMMUNITY)
Admission: RE | Admit: 2024-04-30 | Discharge: 2024-04-30 | Disposition: A | Discharge status: Home / Self Care | Source: Ambulatory Visit | Attending: Orthopedic Surgery

## 2024-04-30 DIAGNOSIS — I10 Essential (primary) hypertension: Secondary | ICD-10-CM | POA: Diagnosis not present

## 2024-04-30 DIAGNOSIS — E119 Type 2 diabetes mellitus without complications: Secondary | ICD-10-CM | POA: Diagnosis not present

## 2024-04-30 DIAGNOSIS — Z01818 Encounter for other preprocedural examination: Secondary | ICD-10-CM | POA: Insufficient documentation

## 2024-04-30 LAB — BASIC METABOLIC PANEL WITH GFR
Anion gap: 13 (ref 5–15)
BUN: 14 mg/dL (ref 8–23)
CO2: 24 mmol/L (ref 22–32)
Calcium: 10.3 mg/dL (ref 8.9–10.3)
Chloride: 101 mmol/L (ref 98–111)
Creatinine, Ser: 1.06 mg/dL — ABNORMAL HIGH (ref 0.44–1.00)
GFR, Estimated: 54 mL/min — ABNORMAL LOW (ref >60)
Glucose, Bld: 107 mg/dL — ABNORMAL HIGH (ref 70–99)
Potassium: 4 mmol/L (ref 3.5–5.1)
Sodium: 138 mmol/L (ref 135–145)

## 2024-04-30 LAB — HEMOGLOBIN A1C
Hgb A1c MFr Bld: 5.9 % — ABNORMAL HIGH (ref 4.8–5.6)
Mean Plasma Glucose: 122.63 mg/dL

## 2024-04-30 LAB — CBC
HCT: 46.5 % — ABNORMAL HIGH (ref 36.0–46.0)
Hemoglobin: 13.4 g/dL (ref 12.0–15.0)
MCH: 23 pg — ABNORMAL LOW (ref 26.0–34.0)
MCHC: 28.8 g/dL — ABNORMAL LOW (ref 30.0–36.0)
MCV: 79.8 fL — ABNORMAL LOW (ref 80.0–100.0)
Platelets: 364 K/uL (ref 150–400)
RBC: 5.83 MIL/uL — ABNORMAL HIGH (ref 3.87–5.11)
RDW: 14 % (ref 11.5–15.5)
WBC: 7.9 K/uL (ref 4.0–10.5)
nRBC: 0 % (ref 0.0–0.2)

## 2024-04-30 LAB — GLUCOSE, CAPILLARY: Glucose-Capillary: 120 mg/dL — ABNORMAL HIGH (ref 70–99)

## 2024-04-30 NOTE — Progress Notes (Addendum)
 COVID Vaccine received:  []  No [x]  Yes Date of any COVID positive Test in last 90 days: no PCP - Karlynn Noel MD Cardiologist - no  Chest x-ray -  EKG -  04/30/24 Stress Test -  ECHO - 09/12/12 Epic Cardiac Cath - 10/25/02 Epic  Bowel Prep - [x]  No  []   Yes ______  Pacemaker / ICD device [x]  No []  Yes   Spinal Cord Stimulator:[x]  No []  Yes       History of Sleep Apnea? [x]  No []  Yes   CPAP used?- [x]  No []  Yes    Does the patient monitor blood sugar?          [x]  No []  Yes  []  N/A  Patient has: []  NO Hx DM   []  Pre-DM                 []  DM1  [x]   DM2 Does patient have a [employer] or Dexacom? [x]  No []  Yes   Fasting Blood Sugar Ranges- 114-120 Checks Blood Sugar ____1_ times a month  GLP1 agonist / usual dose - no GLP1 instructions:  SGLT-2 inhibitors / usual dose - no SGLT-2 instructions:   Blood Thinner / Instructions:no Aspirin Instructions:ASA81 mg- will hold 5 days prior  Comments:   Activity level: Patient is able  to climb a flight of stairs without difficulty; [x]  No CP  [x]  No SOB,   Patient can perform ADLs without assistance.   Anesthesia review: DM, HTN, LBBB  Patient denies shortness of breath, fever, cough and chest pain at PAT appointment.  Patient verbalized understanding and agreement to the Pre-Surgical Instructions that were given to them at this PAT appointment. Patient was also educated of the need to review these PAT instructions again prior to his/her surgery.I reviewed the appropriate phone numbers to call if they have any and questions or concerns.

## 2024-05-01 NOTE — Progress Notes (Signed)
 Anesthesia Chart Review   Case: [id number] Date/Time: 05/07/24 0700   Procedure: ARTHROPLASTY, KNEE, TOTAL (Right: Knee)   Anesthesia type: Choice   Pre-op diagnosis: Right knee osteoarthritis   Location: [facility] ROOM 09 / [facility]   Surgeons: Melodi Lerner, MD       DISCUSSION:78 y.o. former smoker with h/o GERD, HTN, DM II, right knee OA scheduled for above procedure 05/07/24 with Dr. Lerner Melodi.   New LBBB on EKG at PAT visit, cardio eval requested.  VS: BP 124/86   Pulse 90   Temp 36.7 C (Oral)   Resp 18   Ht 5' 1 (1.549 m)   Wt 72.8 kg   SpO2 99%   BMI 30.33 kg/m   PROVIDERS: Plotnikov, Karlynn GAILS, MD is PCP    LABS: Labs reviewed: Acceptable for surgery. (all labs ordered are listed, but only abnormal results are displayed)  Labs Reviewed  BASIC METABOLIC PANEL WITH GFR - Abnormal; Notable for the following components:      Result Value   Glucose, Bld 107 (*)    Creatinine, Ser 1.06 (*)    GFR, Estimated 54 (*)    All other components within normal limits  CBC - Abnormal; Notable for the following components:   RBC 5.83 (*)    HCT 46.5 (*)    MCV 79.8 (*)    MCH 23.0 (*)    MCHC 28.8 (*)    All other components within normal limits  HEMOGLOBIN A1C - Abnormal; Notable for the following components:   Hgb A1c MFr Bld 5.9 (*)    All other components within normal limits  GLUCOSE, CAPILLARY - Abnormal; Notable for the following components:   Glucose-Capillary 120 (*)    All other components within normal limits  SURGICAL PCR SCREEN     IMAGES:   EKG:   CV: Echo 09/12/2012 Left ventricle: The cavity size was normal. Auker thickness  was increased in a pattern of mild LVH. There was concentric  hypertrophy. Systolic function was vigorous. The estimated  ejection fraction was in the range of 65% to 70%. There was  dynamic obstruction during Valsalvain the mid cavity, with  mid-cavity obliteration, a peak velocity of 271cm/sec, and a  peak gradient of 29mm  Hg. Landgrebe motion was normal; there were  no regional Hapke motion abnormalities. There was an  increased relative contribution of atrial contraction to  ventricular filling. Doppler parameters are consistent with  abnormal left ventricular relaxation (grade 1 diastolic  dysfunction).   Past Medical History:  Diagnosis Date   Abnormal EKG 2014   NONSPECIFIC ST DEPRESSION + T ANORMALITY, WORKED UP 2014 DR CATHLYN BIRMINGHAM AND RELEASED BY DR BIRMINGHAM   Acute sinusitis 07/01/2021   11/22, 2/23 Omnicef   Augmentin  bid x 10 d   Acute tonsillitis 08/05/2008   Allergic rhinitis 11/11/2015   3//17 Zyrtec   Use Sinus rinse - given   Allergy    seasonal   Anemia, unspecified 10/27/2007   Ankle swelling 04/23/2019   Asthma    PRN inhalers   Blood in stool 10/27/2007   Recurrent   Chronic   Dr Aneita  Colon is due this fall 2019   Blood transfusion without reported diagnosis    Carpal tunnel syndrome 10/27/2007   R>L  She had L wrist operated on      Cataract    Cerumen impaction 12/01/2009   B wax      Chest pain, atypical 08/26/2012   Chronic knee pain after  total replacement of right knee joint 04/21/2022   Closed undisplaced fracture of nasal bone 12/09/2016   Colon polyps    Cystitis 09/08/2009   Dizziness 04/04/2009   Dog bite 09/27/2022   Face 09/2022  S/p Rabies shots   Dyslipidemia 06/20/2007   Chronic   Statin intolerant  On diet  Use fish oil   Eruption cyst 04/05/2014   Fatty infiltration of liver    Finger contusion 11/30/2010   R 4th   Foot pain 06/13/2012   2020 L dorsal pain - OA/capsulitis  2021 L ankle pain since the Fall of 2020 - saw dr Magdalen, was wearing a boot in may 2021  Inflammatory tendinitis left posterior tib along with flatfoot deformity which is contributory towards the problem  6/21 Treat edema of LEs  Medrol  pac  Sports med ref if not better   Forehead contusion 11/19/2016   C/o hitting a cabinet after chocking on ice - fell; ?passed out at the client's house  November 04 2016; had a hematoma on the central forehead. She had a HA x1d. She had a black eye later. No n/v.  Nose bones X ray IMPRESSION:  Depressed anterior nasal bone fractures bilaterally.     Mild swelling of the nasal septal soft tissues.        Electronically Signed    By: Selinda DELENA Blue M.D.    On: 04/12/201   Ganglion cyst 11/11/2015   L wrist tender 1 cm ganglion 3/17  Dr Camella  ?L foot 2020   Gastric polyp 07/18/2018   GERD (gastroesophageal reflux disease)    Hematochezia 10/27/2007   Hemorrhage of gastrointestinal tract 10/27/2007   Hemorrhoids 10/27/2007   History of COVID-19 09/01/2019   History of revision of total replacement of left hip joint 11/17/2018   HTN (hypertension) 07/11/2007   Chronic - on Norvasc  - d/c (swelling)  D/c Benazepril  due to cough 6/16 - restarted 8/16  D/c Losartan  due to HA  9/20 - Cont on Maxzide   Hydradenitis 01/2009   Right S aureus   Ingrown right big toenail 09/27/2014   Near syncope 10/13/2012   2/14  Abn ECHO   OA (osteoarthritis) of hip 10/18/2018   Osteoarthritis of right knee 04/25/2022   Otitis externa 07/27/2010   2020  Corisporin      Rash and nonspecific skin eruption 04/05/2014   8/15 recurrent - R inner thigh  ?herpes - pt decline Rx   Shoulder pain, right 11/30/2010   R rot cuff strain, repeat 2023  Arnica gel or cream on bruises     S/p PT at [facility]  Blue-Emu cream was recommended  2-3 times a day   Sinus infection 10/02/2018   Statin intolerance 07/29/2020   Tennis elbow 01/13/2021   5/22 L  Ice, Voltaren gel, massage   Tubulovillous adenoma polyp of colon 03/2007   w/ focal high grade dysplasia, no carcinoma   Type II diabetes mellitus 06/12/2007   Diet controlled  CT ca scoring info given 12/19, advised     Not taking Repaginate - re-start  Risks associated with treatment noncompliance were discussed. Compliance was encouraged.  Re-start Rybelsus  - use Assistance program, if covered - too $$$   URI, acute 06/11/2011    10/12, 10/13, 4/15, 11/15, 3/16, 6/16, 2/20, 1/21   UTI (urinary tract infection) 01/04/2014    Past Surgical History:  Procedure Laterality Date   CARPAL TUNNEL RELEASE     Lt  wrist, rt wrist 2014  COLONOSCOPY     POLYPECTOMY     TOTAL HIP ARTHROPLASTY Left 10/18/2018   Procedure: TOTAL HIP ARTHROPLASTY ANTERIOR APPROACH;  Surgeon: Melodi Lerner, MD;  Location: [facility];  Service: Orthopedics;  Laterality: Left;    TUBAL LIGATION      MEDICATIONS:  aspirin 81 MG chewable tablet   cetirizine  (ZYRTEC ) 10 MG tablet   cholecalciferol (VITAMIN D3) 10 MCG (400 UNIT) TABS tablet   fluticasone  (FLONASE ) 50 MCG/ACT nasal spray   furosemide  (LASIX ) 20 MG tablet   hydrocortisone  (ANUSOL -HC) 25 MG suppository   Multiple Vitamin (MULTIVITAMIN WITH MINERALS) TABS tablet   NON FORMULARY   pantoprazole  (PROTONIX ) 40 MG tablet   polyethylene glycol (MIRALAX ) 17 g packet   potassium chloride  SA (KLOR-CON  M) 20 MEQ tablet   repaglinide  (PRANDIN ) 1 MG tablet   triamcinolone  cream (KENALOG ) 0.5 %   triamterene -hydrochlorothiazide (MAXZIDE-25) 37.5-25 MG tablet   trolamine salicylate (ASPERCREME) 10 % cream   No current facility-administered medications for this encounter.     Harlene Hoots Ward, PA-C [facility] [phone]

## 2024-05-04 ENCOUNTER — Telehealth: Payer: Self-pay

## 2024-05-04 NOTE — Telephone Encounter (Signed)
   Name: Monica Horn  DOB: Nov 06, 1945  MRN: [id number]  Primary Cardiologist: None  Chart reviewed as part of pre-operative protocol coverage. Because of Christy J Gallardo's past medical history and time since last visit, she will require a follow-up in-office visit in order to better assess preoperative cardiovascular risk.  Patient was previously evaluated Dr. Waddell in 2014.  Patient has not been seen since.  Patient will need new patient appointment with MD to establish care-patient does not currently follow with our practice.  Pre-op covering staff: - Please schedule appointment and call patient to inform them. If patient already had an upcoming appointment within acceptable timeframe, please add pre-op clearance to the appointment notes so provider is aware. - Please contact requesting surgeon's office via preferred method (i.e, phone, fax) to inform them of need for appointment prior to surgery.   Damien JAYSON Braver, NP  05/04/2024, 4:55 PM

## 2024-05-04 NOTE — Telephone Encounter (Signed)
   Pre-operative Risk Assessment    Patient Name: Monica Horn  DOB: 1945/11/28 MRN: [id number]   Date of last office visit: 11/15/22 Date of next office visit: Not scheduled   Request for Surgical Clearance    Procedure:  Right total knee arthroplasty  Date of Surgery:  Clearance 07/16/24                                Surgeon:  Dr. Dempsey Lawn Surgeon's Group or Practice Name:  [facility] Phone number:  [phone] Fax number:  [phone]   Type of Clearance Requested:   - Medical    Type of Anesthesia:  Choice   Additional requests/questions:    Bonney Ival LOISE Gerome   05/04/2024, 4:32 PM

## 2024-05-07 NOTE — Telephone Encounter (Signed)
 S/W pt and scheduled IN OFFICE appt 06/19/24 with Dr Kriste  Pt stated that she would call back and R/S if she can not make this appt.  Will update the surgeons office.

## 2024-05-10 ENCOUNTER — Ambulatory Visit: Attending: Physical Therapy | Admitting: Physical Therapy

## 2024-05-15 ENCOUNTER — Ambulatory Visit: Admitting: Physical Therapy

## 2024-05-17 ENCOUNTER — Ambulatory Visit: Admitting: Physical Therapy

## 2024-05-17 ENCOUNTER — Telehealth: Payer: Self-pay | Admitting: Physical Therapy

## 2024-05-17 NOTE — Telephone Encounter (Signed)
 Pt was contacted today at [phone] to discuss recent change in surgery date alongside currently scheduled PT appointments. It was discussed and agreed that current scheduled OPPT appointments should be canceled until surgery planned in December is performed   Mabel Kiang, PT, DPT 05/17/2024, 9:39 AM

## 2024-05-21 ENCOUNTER — Encounter: Admitting: Physical Therapy

## 2024-05-23 ENCOUNTER — Ambulatory Visit: Admitting: Physical Therapy

## 2024-05-29 ENCOUNTER — Ambulatory Visit: Admitting: Physical Therapy

## 2024-05-31 ENCOUNTER — Encounter: Admitting: Physical Therapy

## 2024-06-18 ENCOUNTER — Ambulatory Visit: Admitting: Internal Medicine

## 2024-06-19 ENCOUNTER — Ambulatory Visit: Admitting: Internal Medicine

## 2024-06-20 ENCOUNTER — Ambulatory Visit (INDEPENDENT_AMBULATORY_CARE_PROVIDER_SITE_OTHER): Admitting: Internal Medicine

## 2024-06-20 ENCOUNTER — Encounter: Payer: Self-pay | Admitting: Internal Medicine

## 2024-06-20 VITALS — BP 108/70 | HR 101 | Ht 61.0 in | Wt 166.7 lb

## 2024-06-20 DIAGNOSIS — E785 Hyperlipidemia, unspecified: Secondary | ICD-10-CM

## 2024-06-20 DIAGNOSIS — I447 Left bundle-branch block, unspecified: Secondary | ICD-10-CM | POA: Diagnosis not present

## 2024-06-20 DIAGNOSIS — E876 Hypokalemia: Secondary | ICD-10-CM | POA: Diagnosis not present

## 2024-06-20 DIAGNOSIS — K219 Gastro-esophageal reflux disease without esophagitis: Secondary | ICD-10-CM | POA: Diagnosis not present

## 2024-06-20 MED ORDER — POTASSIUM CHLORIDE ER 10 MEQ PO TBCR: 10.0000 meq | EXTENDED_RELEASE_TABLET | Freq: Two times a day (BID) | ORAL | 3 refills | Status: AC

## 2024-06-20 NOTE — Assessment & Plan Note (Signed)
 Pre-op EKG w/LBBB in Sept , 2025, has an appt w/Dr [facility] 06/26/24

## 2024-06-20 NOTE — Progress Notes (Signed)
 Subjective:  Patient ID: Monica Horn, female    DOB: [birth year]  Age: 78 y.o. MRN: [id number]  CC: Knee Injury (Knee, procedure scheduled 12/01)   HPI Monica Horn presents for Jul 16, 2024 R TKA EKG w/LBBB in Sept , 2025, has an appt w/Dr Inocencio 06/26/24  Outpatient Medications Prior to Visit  Medication Sig Dispense Refill   aspirin 81 MG chewable tablet Chew 81 mg by mouth daily after breakfast.     cetirizine  (ZYRTEC ) 10 MG tablet Take 1 tablet (10 mg total) by mouth daily as needed. (Patient taking differently: Take 10 mg by mouth in the morning.) 90 tablet 3   cholecalciferol (VITAMIN D3) 10 MCG (400 UNIT) TABS tablet Take 1,000 Units by mouth in the morning.     fluticasone  (FLONASE ) 50 MCG/ACT nasal spray Use 2 spray(s) in each nostril once daily 16 g 5   furosemide  (LASIX ) 20 MG tablet TAKE 1 TO 2 TABLETS EVERY DAY (Patient taking differently: Take 20 mg by mouth daily as needed (fluid retention.).) 180 tablet 3   hydrocortisone  (ANUSOL -HC) 25 MG suppository Place 1 suppository (25 mg total) rectally 2 (two) times daily. (Patient taking differently: Place 25 mg rectally 2 (two) times daily as needed for hemorrhoids.) 12 suppository 0   Multiple Vitamin (MULTIVITAMIN WITH MINERALS) TABS tablet Take 1 tablet by mouth in the morning.     NON FORMULARY Take 1 capsule by mouth in the morning. [place]     pantoprazole  (PROTONIX ) 40 MG tablet TAKE 1 TABLET EVERY DAY 90 tablet 3   polyethylene glycol (MIRALAX ) 17 g packet Take 17 g by mouth daily as needed.     repaglinide  (PRANDIN ) 1 MG tablet Take 1 tablet (1 mg total) by mouth 3 (three) times daily before meals. (Patient taking differently: Take 1 mg by mouth in the morning.) 270 tablet 3   triamcinolone  cream (KENALOG ) 0.5 % Apply 1 Application topically 3 (three) times daily. (Patient taking differently: Apply 1 Application topically 3 (three) times daily as needed (poison oak).) 60 g 2   triamterene -hydrochlorothiazide  (MAXZIDE-25) 37.5-25 MG tablet TAKE 1 TABLET EVERY DAY 90 tablet 3   trolamine salicylate (ASPERCREME) 10 % cream Apply 1 application topically 2 (two) times daily as needed for muscle pain.     potassium chloride  SA (KLOR-CON  M) 20 MEQ tablet Take 1 tablet (20 mEq total) by mouth daily. (Patient taking differently: Take 10 mEq by mouth daily.) 90 tablet 3   No facility-administered medications prior to visit.    ROS: Review of Systems  Constitutional:  Negative for activity change, appetite change, chills, fatigue and unexpected weight change.  HENT:  Negative for congestion, mouth sores and sinus pressure.   Eyes:  Negative for visual disturbance.  Respiratory:  Negative for cough, chest tightness and shortness of breath.   Cardiovascular:  Negative for palpitations and leg swelling.  Gastrointestinal:  Negative for abdominal pain and nausea.  Genitourinary:  Negative for difficulty urinating, frequency and vaginal pain.  Musculoskeletal:  Positive for arthralgias and gait problem. Negative for back pain.  Skin:  Negative for pallor and rash.  Neurological:  Negative for dizziness, tremors, weakness, numbness and headaches.  Psychiatric/Behavioral:  Negative for confusion and sleep disturbance.     Objective:  BP 108/70   Pulse (!) 101   Ht 5' 1 (1.549 m)   Wt 166 lb 11.2 oz (75.6 kg)   SpO2 99%   BMI 31.50 kg/m   BP Readings from  Last 3 Encounters:  06/28/24 122/80  06/26/24 120/80  06/20/24 108/70    Wt Readings from Last 3 Encounters:  06/28/24 165 lb 9.6 oz (75.1 kg)  06/26/24 166 lb (75.3 kg)  06/20/24 166 lb 11.2 oz (75.6 kg)    Physical Exam Constitutional:      General: She is not in acute distress.    Appearance: She is well-developed. She is obese.  HENT:     Head: Normocephalic.     Right Ear: External ear normal.     Left Ear: External ear normal.     Nose: Nose normal.  Eyes:     General:        Right eye: No discharge.        Left eye: No  discharge.     Conjunctiva/sclera: Conjunctivae normal.     Pupils: Pupils are equal, round, and reactive to light.  Neck:     Thyroid : No thyromegaly.     Vascular: No JVD.     Trachea: No tracheal deviation.  Cardiovascular:     Rate and Rhythm: Normal rate and regular rhythm.     Heart sounds: Normal heart sounds.  Pulmonary:     Effort: No respiratory distress.     Breath sounds: No stridor. No wheezing.  Abdominal:     General: Bowel sounds are normal. There is no distension.     Palpations: Abdomen is soft. There is no mass.     Tenderness: There is no abdominal tenderness. There is no guarding or rebound.  Musculoskeletal:        General: No tenderness.     Cervical back: Normal range of motion and neck supple. No rigidity.     Right lower leg: No edema.     Left lower leg: No edema.  Lymphadenopathy:     Cervical: No cervical adenopathy.  Skin:    Findings: No erythema or rash.  Neurological:     Cranial Nerves: No cranial nerve deficit.     Motor: No abnormal muscle tone.     Coordination: Coordination normal.     Gait: Gait normal.     Deep Tendon Reflexes: Reflexes normal.  Psychiatric:        Behavior: Behavior normal.        Thought Content: Thought content normal.        Judgment: Judgment normal.     Lab Results  Component Value Date   WBC 7.9 04/30/2024   HGB 13.4 04/30/2024   HCT 46.5 (H) 04/30/2024   PLT 364 04/30/2024   GLUCOSE 107 (H) 04/30/2024   CHOL 222 (H) 11/16/2022   TRIG 273.0 (H) 11/16/2022   HDL 40.40 11/16/2022   LDLDIRECT 124.0 11/16/2022   LDLCALC 130 (H) 06/24/2017   ALT 23 12/12/2023   AST 18 12/12/2023   NA 138 04/30/2024   K 4.0 04/30/2024   CL 101 04/30/2024   CREATININE 1.06 (H) 04/30/2024   BUN 14 04/30/2024   CO2 24 04/30/2024   TSH 1.61 01/06/2022   INR 0.9 10/13/2018   HGBA1C 5.9 (H) 04/30/2024    No results found.  Assessment & Plan:   Problem List Items Addressed This Visit     Dyslipidemia   On  diet Use fish oil      GERD (gastroesophageal reflux disease) - Primary   Continue on Protonix       Hypokalemia   Change to klor-con       LBBB (left bundle branch block)   Pre-op  EKG w/LBBB in Sept , 2025, has an appt w/Dr Inocencio 06/26/24         Meds ordered this encounter  Medications   potassium chloride  (KLOR-CON  10) 10 MEQ tablet    Sig: Take 1 tablet (10 mEq total) by mouth 2 (two) times daily.    Dispense:  180 tablet    Refill:  3      Follow-up: Return in about 4 months (around 10/18/2024) for a follow-up visit.  Marolyn Noel, MD

## 2024-06-20 NOTE — Assessment & Plan Note (Signed)
 Continue on Protonix

## 2024-06-20 NOTE — Assessment & Plan Note (Signed)
On diet Use fish oil

## 2024-06-20 NOTE — Assessment & Plan Note (Signed)
 Change to klor-con 

## 2024-06-25 DIAGNOSIS — M1711 Unilateral primary osteoarthritis, right knee: Secondary | ICD-10-CM | POA: Diagnosis not present

## 2024-06-26 ENCOUNTER — Ambulatory Visit: Attending: Cardiology | Admitting: Cardiology

## 2024-06-26 ENCOUNTER — Telehealth: Payer: Self-pay

## 2024-06-26 ENCOUNTER — Encounter: Payer: Self-pay | Admitting: Cardiology

## 2024-06-26 VITALS — BP 120/80 | HR 103 | Ht 61.0 in | Wt 166.0 lb

## 2024-06-26 DIAGNOSIS — R9431 Abnormal electrocardiogram [ECG] [EKG]: Secondary | ICD-10-CM

## 2024-06-26 DIAGNOSIS — I447 Left bundle-branch block, unspecified: Secondary | ICD-10-CM | POA: Diagnosis not present

## 2024-06-26 NOTE — Patient Instructions (Addendum)
 Medication Instructions:  Your physician recommends that you continue on your current medications as directed. Please refer to the Current Medication list given to you today.  *If you need a refill on your cardiac medications before your next appointment, please call your pharmacy*  Lab Work: None ordered.  If you have labs (blood work) drawn today and your tests are completely normal, you will receive your results only by: MyChart Message (if you have MyChart) OR A paper copy in the mail If you have any lab test that is abnormal or we need to change your treatment, we will call you to review the results.  Testing/Procedures: Your physician has requested that you have an echocardiogram. Echocardiography is a painless test that uses sound waves to create images of your heart. It provides your doctor with information about the size and shape of your heart and how well your heart's chambers and valves are working. This procedure takes approximately one hour. There are no restrictions for this procedure. Please do NOT wear cologne, perfume, aftershave, or lotions (deodorant is allowed). Please arrive 15 minutes prior to your appointment time.  Please note: We ask at that you not bring children with you during ultrasound (echo/ vascular) testing. Due to room size and safety concerns, children are not allowed in the ultrasound rooms during exams. Our front office staff cannot provide observation of children in our lobby area while testing is being conducted. An adult accompanying a patient to their appointment will only be allowed in the ultrasound room at the discretion of the ultrasound technician under special circumstances. We apologize for any inconvenience.    Follow-Up: At [facility], you and your health needs are our priority.  As part of our continuing mission to provide you with exceptional heart care, our providers are all part of one team.  This team includes your primary  Cardiologist (physician) and Advanced Practice Providers or APPs (Physician Assistants and Nurse Practitioners) who all work together to provide you with the care you need, when you need it.  Your next appointment:  To be determined

## 2024-06-26 NOTE — Addendum Note (Signed)
 Addended by: CASIMIR ALDONA BRAVO on: 06/26/2024 03:02 PM   Modules accepted: Orders

## 2024-06-26 NOTE — Telephone Encounter (Signed)
   Pre-operative Risk Assessment    Patient Name: Monica Horn  DOB: 07/09/1946 MRN: [id number]   Date of last office visit: 11/14/12  Dr. Waddell Date of next office visit: 06/26/24 Dr. Inocencio   Request for Surgical Clearance    Procedure:  Right Total Knee Arthroplasty  Date of Surgery:  Clearance 10/08/24                               Surgeon:  Dr. Dempsey Moan Surgeon's Group or Practice Name:  [facility] Phone number:  [phone] Fax number:  [phone]  Kerri Maze   Type of Clearance Requested:   - Medical  - Pharmacy:  Hold Aspirin     Type of Anesthesia:  Choice  Additional requests/questions:    Bonney Arlyne LITTIE Kallie   06/26/2024, 1:54 PM

## 2024-06-26 NOTE — Progress Notes (Signed)
  Electrophysiology Office Note:   Date:  06/26/2024  ID:  Monica Horn, DOB 1945-09-07, MRN [id number]  Primary Cardiologist: None Primary Heart Failure: None Electrophysiologist: None      History of Present Illness:   Monica Horn is a 78 y.o. female with h/o hypertension, diabetes type 2 seen today for  for Electrophysiology evaluation of new left bundle branch block at the request of Dempsey Aluisio.    Patient has plans for right knee TKA due to arthritis.  In her preop visit, she was noted to have a left bundle branch block.  She presents today for evaluation.  She currently feels well.  She does have an elevated resting heart rate that is a chronic issue for her.  She has no chest pain and no shortness of breath.  She is able to do all her daily activities without restriction.  She exercises and at times has to sit down and rest, but she does not feel that this is abnormal for the level of exercise.  She does get somewhat short of breath when she walks long distances, but [place] and only had to rest a few times.  She has no other acute symptoms.  She does not have any chest pain.  Review of systems complete and found to be negative unless listed in HPI.   EP Information / Studies Reviewed:    EKG is ordered today. Personal review as below.        Risk Assessment/Calculations:            Physical Exam:   VS:  There were no vitals taken for this visit.   Wt Readings from Last 3 Encounters:  06/20/24 166 lb 11.2 oz (75.6 kg)  04/30/24 160 lb 8 oz (72.8 kg)  04/24/24 170 lb (77.1 kg)     GEN: Well nourished, well developed in no acute distress NECK: No JVD; No carotid bruits CARDIAC: Regular rate and rhythm, no murmurs, rubs, gallops RESPIRATORY:  Clear to auscultation without rales, wheezing or rhonchi  ABDOMEN: Soft, non-tender, non-distended EXTREMITIES:  No edema; No deformity   ASSESSMENT AND PLAN:    1.  Left bundle branch block: In  review of patient's EKGs, she did not have a left bundle branch block on her most recent EKG February 2020.  Left bundle branch block is new.  She does not have symptoms of shortness of breath, chest pain or fatigue, though she is limited by her knee arthritis and activities.  Rodman Recupero plan for transthoracic echo.  If echo is normal, no further workup is needed.  2.  Preoperative evaluation: Patient is minimally symptomatic.  No further workup is needed if her echo is normal.  She would be at low to intermediate risk for an intermediate risk procedure.  Follow up with EP Team pending echo   Signed, Callaway Hailes Gladis Norton, MD

## 2024-06-26 NOTE — Telephone Encounter (Signed)
 Dr. Inocencio, you saw this pt today in clinic.  I appreciate your note regarding preoperative clearance. Once her echo has resulted, could you please finalize and route her surgical recommendations to the appropriate party at that time. Thank you.

## 2024-06-27 ENCOUNTER — Ambulatory Visit (HOSPITAL_COMMUNITY)
Admission: RE | Admit: 2024-06-27 | Discharge: 2024-06-27 | Disposition: A | Discharge status: Home / Self Care | Source: Ambulatory Visit | Attending: Cardiology | Admitting: Cardiology

## 2024-06-27 DIAGNOSIS — I447 Left bundle-branch block, unspecified: Secondary | ICD-10-CM

## 2024-06-27 DIAGNOSIS — R9431 Abnormal electrocardiogram [ECG] [EKG]: Secondary | ICD-10-CM

## 2024-06-27 LAB — ECHOCARDIOGRAM COMPLETE
AR max vel: 0.55 cm2
AV Area VTI: 0.51 cm2
AV Area mean vel: 0.53 cm2
AV Mean grad: 6 mmHg
AV Peak grad: 10.4 mmHg
Ao pk vel: 1.62 m/s
Area-P 1/2: 5.42 cm2
S' Lateral: 2.29 cm

## 2024-06-28 ENCOUNTER — Ambulatory Visit: Payer: Medicare HMO

## 2024-06-28 VITALS — BP 122/80 | HR 98 | Ht 61.0 in | Wt 165.6 lb

## 2024-06-28 DIAGNOSIS — Z0000 Encounter for general adult medical examination without abnormal findings: Secondary | ICD-10-CM | POA: Diagnosis not present

## 2024-06-28 NOTE — Progress Notes (Cosign Needed Addendum)
 Chief Complaint  Patient presents with   Medicare Wellness     Subjective:   Monica Horn is a 78 y.o. female who presents for a Medicare Annual Wellness Visit.  Allergies (verified) Amlodipine , Benazepril , Ibuprofen , Losartan , and Lovastatin    History: Past Medical History:  Diagnosis Date   Abnormal EKG 2014   NONSPECIFIC ST DEPRESSION + T ANORMALITY, WORKED UP 2014 DR CATHLYN BIRMINGHAM AND RELEASED BY DR BIRMINGHAM   Acute sinusitis 07/01/2021   11/22, 2/23 Omnicef   Augmentin  bid x 10 d   Acute tonsillitis 08/05/2008   Allergic rhinitis 11/11/2015   3//17 Zyrtec   Use Sinus rinse - given   Allergy    seasonal   Anemia, unspecified 10/27/2007   Ankle swelling 04/23/2019   Asthma    PRN inhalers   Blood in stool 10/27/2007   Recurrent   Chronic   Dr Aneita  Colon is due this fall 2019   Blood transfusion without reported diagnosis    Carpal tunnel syndrome 10/27/2007   R>L  She had L wrist operated on      Cataract    Cerumen impaction 12/01/2009   B wax      Chest pain, atypical 08/26/2012   Chronic knee pain after total replacement of right knee joint 04/21/2022   Closed undisplaced fracture of nasal bone 12/09/2016   Colon polyps    Cystitis 09/08/2009   Dizziness 04/04/2009   Dog bite 09/27/2022   Face 09/2022  S/p Rabies shots   Dyslipidemia 06/20/2007   Chronic   Statin intolerant  On diet  Use fish oil   Eruption cyst 04/05/2014   Fatty infiltration of liver    Finger contusion 11/30/2010   R 4th   Foot pain 06/13/2012   2020 L dorsal pain - OA/capsulitis  2021 L ankle pain since the Fall of 2020 - saw dr Magdalen, was wearing a boot in may 2021  Inflammatory tendinitis left posterior tib along with flatfoot deformity which is contributory towards the problem  6/21 Treat edema of LEs  Medrol  pac  Sports med ref if not better   Forehead contusion 11/19/2016   C/o hitting a cabinet after chocking on ice - fell; ?passed out at the client's house November 04 2016; had  a hematoma on the central forehead. She had a HA x1d. She had a black eye later. No n/v.  Nose bones X ray IMPRESSION:  Depressed anterior nasal bone fractures bilaterally.     Mild swelling of the nasal septal soft tissues.        Electronically Signed    By: Selinda DELENA Blue M.D.    On: 04/12/201   Ganglion cyst 11/11/2015   L wrist tender 1 cm ganglion 3/17  Dr Camella  ?L foot 2020   Gastric polyp 07/18/2018   GERD (gastroesophageal reflux disease)    Hematochezia 10/27/2007   Hemorrhage of gastrointestinal tract 10/27/2007   Hemorrhoids 10/27/2007   History of COVID-19 09/01/2019   History of revision of total replacement of left hip joint 11/17/2018   HTN (hypertension) 07/11/2007   Chronic - on Norvasc  - d/c (swelling)  D/c Benazepril  due to cough 6/16 - restarted 8/16  D/c Losartan  due to HA  9/20 - Cont on Maxzide   Hydradenitis 01/2009   Right S aureus   Ingrown right big toenail 09/27/2014   Near syncope 10/13/2012   2/14  Abn ECHO   OA (osteoarthritis) of hip 10/18/2018   Osteoarthritis of right knee  04/25/2022   Otitis externa 07/27/2010   2020  Corisporin      Rash and nonspecific skin eruption 04/05/2014   8/15 recurrent - R inner thigh  ?herpes - pt decline Rx   Shoulder pain, right 11/30/2010   R rot cuff strain, repeat 2023  Arnica gel or cream on bruises     S/p PT at [facility]  Blue-Emu cream was recommended  2-3 times a day   Sinus infection 10/02/2018   Statin intolerance 07/29/2020   Tennis elbow 01/13/2021   5/22 L  Ice, Voltaren gel, massage   Tubulovillous adenoma polyp of colon 03/2007   w/ focal high grade dysplasia, no carcinoma   Type II diabetes mellitus 06/12/2007   Diet controlled  CT ca scoring info given 12/19, advised     Not taking Repaginate - re-start  Risks associated with treatment noncompliance were discussed. Compliance was encouraged.  Re-start Rybelsus  - use Assistance program, if covered - too $$$   URI, acute 06/11/2011   10/12, 10/13,  4/15, 11/15, 3/16, 6/16, 2/20, 1/21   UTI (urinary tract infection) 01/04/2014   Past Surgical History:  Procedure Laterality Date   CARPAL TUNNEL RELEASE     Lt  wrist, rt wrist 2014   COLONOSCOPY     POLYPECTOMY     TOTAL HIP ARTHROPLASTY Left 10/18/2018   Procedure: TOTAL HIP ARTHROPLASTY ANTERIOR APPROACH;  Surgeon: Melodi Lerner, MD;  Location: [facility];  Service: Orthopedics;  Laterality: Left;    TUBAL LIGATION     Family History  Problem Relation Age of Onset   Stroke Mother    Hypertension Mother    Lung cancer Mother    Cancer Father        ? liver ca   Alcohol abuse Father    Hypertension Other    Stroke Other    Colon cancer Neg Hx    Colon polyps Neg Hx    Rectal cancer Neg Hx    Stomach cancer Neg Hx    Esophageal cancer Neg Hx    Social History   Occupational History   Occupation: Retired/  Tobacco Use   Smoking status: Former    Current packs/day: 0.00    Types: Cigarettes    Quit date: 1979    Years since quitting: 46.9   Smokeless tobacco: Never  Vaping Use   Vaping status: Never Used  Substance and Sexual Activity   Alcohol use: Not Currently    Alcohol/week: 1.0 standard drink of alcohol    Types: 1 Glasses of wine per week    Comment: occasional   Drug use: No   Sexual activity: Yes   Tobacco Counseling Counseling given: Not Answered  SDOH Screenings   Food Insecurity: No Food Insecurity (06/28/2024)  Housing: Unknown (06/28/2024)  Transportation Needs: No Transportation Needs (06/28/2024)  Utilities: Not At Risk (06/28/2024)  Alcohol Screen: Low Risk  (06/27/2023)  Depression (PHQ2-9): Low Risk  (06/28/2024)  Financial Resource Strain: Low Risk  (06/27/2023)  Physical Activity: Sufficiently Active (06/28/2024)  Social Connections: Socially Integrated (06/28/2024)  Stress: No Stress Concern Present (06/28/2024)  Tobacco Use: Medium Risk (06/28/2024)  Health Literacy: Adequate Health Literacy (06/28/2024)   See flowsheets for  full screening details  Depression Screen PHQ 2 & 9 Depression Scale- Over the past 2 weeks, how often have you been bothered by any of the following problems? Little interest or pleasure in doing things: 0 Feeling down, depressed, or hopeless (PHQ Adolescent also includes...irritable): 0 PHQ-2  Total Score: 0 Trouble falling or staying asleep, or sleeping too much: 0 Feeling tired or having little energy: 0 Poor appetite or overeating (PHQ Adolescent also includes...weight loss): 0 Feeling bad about yourself - or that you are a failure or have let yourself or your family down: 0 Trouble concentrating on things, such as reading the newspaper or watching television (PHQ Adolescent also includes...like school work): 0 Moving or speaking so slowly that other people could have noticed. Or the opposite - being so fidgety or restless that you have been moving around a lot more than usual: 0 Thoughts that you would be better off dead, or of hurting yourself in some way: 0 PHQ-9 Total Score: 0 If you checked off any problems, how difficult have these problems made it for you to do your work, take care of things at home, or get along with other people?: Not difficult at all  Depression Treatment Depression Interventions/Treatment : EYV7-0 Score <4 Follow-up Not Indicated     Goals Addressed             This Visit's Progress    My goal is to travel more and definitly go on a cruise.   On track      Visit info / Clinical Intake: Medicare Wellness Visit Type:: Subsequent Annual Wellness Visit Persons participating in visit:: patient Medicare Wellness Visit Mode:: In-person (required for WTM) Information given by:: patient Interpreter Needed?: No Pre-visit prep was completed: no AWV questionnaire completed by patient prior to visit?: no Living arrangements:: lives with spouse/significant other Patient's Overall Health Status Rating: very good Typical amount of pain: some Does pain affect  daily life?: no Are you currently prescribed opioids?: no  Dietary Habits and Nutritional Risks How many meals a day?: 3 Eats fruit and vegetables daily?: yes Most meals are obtained by: preparing own meals In the last 2 weeks, have you had any of the following?: none Diabetic:: (!) yes Any non-healing wounds?: no How often do you check your BS?: as needed Would you like to be referred to a Nutritionist or for Diabetic Management? : no  Functional Status Activities of Daily Living (to include ambulation/medication): Independent Ambulation: Independent with device- listed below Home Assistive Devices/Equipment: Eyeglasses Medication Administration: Independent Home Management: Independent Manage your own finances?: yes Primary transportation is: driving Concerns about vision?: no *vision screening is required for WTM* Concerns about hearing?: no  Fall Screening Falls in the past year?: 0 Number of falls in past year: 0 Was there an injury with Fall?: 0 Fall Risk Category Calculator: 0 Patient Fall Risk Level: Low Fall Risk  Fall Risk Patient at Risk for Falls Due to: No Fall Risks Fall risk Follow up: Falls evaluation completed; Falls prevention discussed  Home and Transportation Safety: All rugs have non-skid backing?: N/A, no rugs All stairs or steps have railings?: N/A, no stairs Grab bars in the bathtub or shower?: yes Have non-skid surface in bathtub or shower?: yes Good home lighting?: yes Regular seat belt use?: yes Hospital stays in the last year:: no  Cognitive Assessment Difficulty concentrating, remembering, or making decisions? : no Will 6CIT or Mini Cog be Completed: no 6CIT or Mini Cog Declined: patient alert, oriented, able to answer questions appropriately and recall recent events  Advance Directives (For Healthcare) Does Patient Have a Medical Advance Directive?: No (Need it noterized) Would patient like information on creating a medical advance  directive?: No - Patient declined  Reviewed/Updated  Reviewed/Updated: Reviewed All (Medical, Surgical, Family, Medications,  Allergies, Care Teams, Patient Goals)        Objective:    Today's Vitals   06/28/24 1308  BP: 122/80  Pulse: 98  SpO2: 98%  Weight: 165 lb 9.6 oz (75.1 kg)  Height: 5' 1 (1.549 m)   Body mass index is 31.29 kg/m.  Current Medications (verified) Outpatient Encounter Medications as of 06/28/2024  Medication Sig   aspirin 81 MG chewable tablet Chew 81 mg by mouth daily after breakfast.   cetirizine  (ZYRTEC ) 10 MG tablet Take 1 tablet (10 mg total) by mouth daily as needed. (Patient taking differently: Take 10 mg by mouth in the morning.)   cholecalciferol (VITAMIN D3) 10 MCG (400 UNIT) TABS tablet Take 1,000 Units by mouth in the morning.   fluticasone  (FLONASE ) 50 MCG/ACT nasal spray Use 2 spray(s) in each nostril once daily   furosemide  (LASIX ) 20 MG tablet TAKE 1 TO 2 TABLETS EVERY DAY (Patient taking differently: Take 20 mg by mouth daily as needed (fluid retention.).)   hydrocortisone  (ANUSOL -HC) 25 MG suppository Place 1 suppository (25 mg total) rectally 2 (two) times daily. (Patient taking differently: Place 25 mg rectally 2 (two) times daily as needed for hemorrhoids.)   Multiple Vitamin (MULTIVITAMIN WITH MINERALS) TABS tablet Take 1 tablet by mouth in the morning.   NON FORMULARY Take 1 capsule by mouth in the morning. [place]   pantoprazole  (PROTONIX ) 40 MG tablet TAKE 1 TABLET EVERY DAY   polyethylene glycol (MIRALAX ) 17 g packet Take 17 g by mouth daily as needed.   potassium chloride  (KLOR-CON  10) 10 MEQ tablet Take 1 tablet (10 mEq total) by mouth 2 (two) times daily.   repaglinide  (PRANDIN ) 1 MG tablet Take 1 tablet (1 mg total) by mouth 3 (three) times daily before meals. (Patient taking differently: Take 1 mg by mouth in the morning.)   triamcinolone  cream (KENALOG ) 0.5 % Apply 1 Application topically 3 (three) times daily. (Patient  taking differently: Apply 1 Application topically 3 (three) times daily as needed (poison oak).)   triamterene -hydrochlorothiazide (MAXZIDE-25) 37.5-25 MG tablet TAKE 1 TABLET EVERY DAY   trolamine salicylate (ASPERCREME) 10 % cream Apply 1 application topically 2 (two) times daily as needed for muscle pain.   No facility-administered encounter medications on file as of 06/28/2024.   Hearing/Vision screen Hearing Screening - Comments:: Denies hearing difficulties   Vision Screening - Comments:: Wears eyeglasses/UTD-per pt/[place] Immunizations and Health Maintenance Health Maintenance  Topic Date Due   Diabetic kidney evaluation - Urine ACR  Never done   FOOT EXAM  09/16/2019   Influenza Vaccine  03/16/2024   COVID-19 Vaccine (6 - 2025-26 season) 04/16/2024   HEMOGLOBIN A1C  10/28/2024   OPHTHALMOLOGY EXAM  02/02/2025   Diabetic kidney evaluation - eGFR measurement  04/30/2025   Medicare Annual Wellness (AWV)  06/28/2025   DTaP/Tdap/Td (3 - Td or Tdap) 09/09/2032   Pneumococcal Vaccine: 50+ Years  Completed   DEXA SCAN  Completed   Hepatitis C Screening  Completed   Meningococcal B Vaccine  Aged Out   Mammogram  Discontinued   Colonoscopy  Discontinued   Zoster Vaccines- Shingrix  Discontinued        Assessment/Plan:  This is a routine wellness examination for Monica Horn.  Patient Care Team: Plotnikov, Karlynn GAILS, MD as PCP - General Aneita Gwendlyn DASEN, MD (Inactive) (Gastroenterology) Camillo Golas, MD as Consulting Physician (Ophthalmology) Regal, Pasco RAMAN, DPM as Consulting Physician (Podiatry) Melodi Lerner, MD as Consulting Physician (Orthopedic Surgery) Marcey Elspeth PARAS,  MD as Consulting Physician (Ophthalmology) Wertman, Sara E, PA-C (Neurology)  I have personally reviewed and noted the following in the patient's chart:   Medical and social history Use of alcohol, tobacco or illicit drugs  Current medications and supplements including opioid prescriptions. Functional  ability and status Nutritional status Physical activity Advanced directives List of other physicians Hospitalizations, surgeries, and ER visits in previous 12 months Vitals Screenings to include cognitive, depression, and falls Referrals and appointments  No orders of the defined types were placed in this encounter.  In addition, I have reviewed and discussed with patient certain preventive protocols, quality metrics, and best practice recommendations. A written personalized care plan for preventive services as well as general preventive health recommendations were provided to patient.   Monica Horn, CMA   06/29/2024   Return in 1 year (on 06/28/2025).  After Visit Summary: (MyChart) Due to this being a telephonic visit, the after visit summary with patients personalized plan was offered to patient via MyChart   Nurse Notes: Patient is due for a foot exam and UACR, which can be done during her next office visit.  She stated that she has had a eye exam recently.  I have sent a request for records out today.  Medical screening examination/treatment/procedure(s) were performed by non-physician practitioner and as supervising physician I was immediately available for consultation/collaboration.  I agree with above. Karlynn Noel, MD

## 2024-06-28 NOTE — Patient Instructions (Addendum)
 Monica Horn,  Thank you for taking the time for your Medicare Wellness Visit. I appreciate your continued commitment to your health goals. Please review the care plan we discussed, and feel free to reach out if I can assist you further.  Please note that Annual Wellness Visits do not include a physical exam. Some assessments may be limited, especially if the visit was conducted virtually. If needed, we may recommend an in-person follow-up with your provider.  Ongoing Care Seeing your primary care provider every 3 to 6 months helps us  monitor your health and provide consistent, personalized care. Last office visit 06/20/2024.  You are due for a foot exam and a kidney evaluation, which will be done during your next office visit. Keep up the good work.   Referrals If a referral was made during today's visit and you haven't received any updates within two weeks, please contact the referred provider directly to check on the status.  Recommended Screenings:  Health Maintenance  Topic Date Due   Yearly kidney health urinalysis for diabetes  Never done   Complete foot exam   09/16/2019   Eye exam for diabetics  01/23/2022   Flu Shot  03/16/2024   COVID-19 Vaccine (6 - 2025-26 season) 04/16/2024   Hemoglobin A1C  10/28/2024   Yearly kidney function blood test for diabetes  04/30/2025   Medicare Annual Wellness Visit  06/28/2025   DTaP/Tdap/Td vaccine (3 - Td or Tdap) 09/09/2032   Pneumococcal Vaccine for age over 58  Completed   DEXA scan (bone density measurement)  Completed   Hepatitis C Screening  Completed   Meningitis B Vaccine  Aged Out   Breast Cancer Screening  Discontinued   Colon Cancer Screening  Discontinued   Zoster (Shingles) Vaccine  Discontinued       04/24/2024    2:45 PM  Advanced Directives  Does Patient Have a Medical Advance Directive? No  Would patient like information on creating a medical advance directive? No - Patient declined    Vision: Annual vision screenings  are recommended for early detection of glaucoma, cataracts, and diabetic retinopathy. These exams can also reveal signs of chronic conditions such as diabetes and high blood pressure.  Dental: Annual dental screenings help detect early signs of oral cancer, gum disease, and other conditions linked to overall health, including heart disease and diabetes.  Please see the attached documents for additional preventive care recommendations.

## 2024-07-03 ENCOUNTER — Encounter (HOSPITAL_COMMUNITY)

## 2024-07-11 ENCOUNTER — Ambulatory Visit: Payer: Self-pay | Admitting: Cardiology

## 2024-07-16 ENCOUNTER — Ambulatory Visit (HOSPITAL_COMMUNITY): Admission: RE | Admit: 2024-07-16 | Source: Home / Self Care | Admitting: Orthopedic Surgery

## 2024-07-16 ENCOUNTER — Encounter (HOSPITAL_COMMUNITY): Admission: RE | Payer: Self-pay | Source: Home / Self Care

## 2024-07-16 SURGERY — ARTHROPLASTY, KNEE, TOTAL
Anesthesia: Choice | Site: Knee | Laterality: Right

## 2024-07-18 NOTE — Telephone Encounter (Signed)
 Pt returning call

## 2024-08-10 ENCOUNTER — Ambulatory Visit

## 2024-08-10 NOTE — Telephone Encounter (Signed)
 No notes

## 2024-08-20 NOTE — Progress Notes (Addendum)
 "     Cardiology Office Note Date:  08/23/2024  ID:  Monica Horn, DOB 1946-02-05, MRN [id number] PCP:  Garald Karlynn GAILS, MD  Cardiologist:  Joelle VEAR Ren Donley, MD  Chief Complaint  Patient presents with   Cardiomyopathy     Problems Pre-op for right knee TKA LBBB TTE 11/25: 35-40%, GH, mod LVH, G1DD Medication intolerance- d/c LN due to HA M: ASA81, FE20 as needed, TE-HTZ 37.5-25  Visits  11/25 EP: new LBBB but asymptomatic --> recommended TTE 1/26: LP/HA1C, coronary CTA, d/c BP med; start SV 24-26 and 25 XL, d/c lasix    Discussed the use of AI scribe software for clinical note transcription with the patient, who gave verbal consent to proceed.  History of Present Illness   Monica Horn is a 79 year old female who presents for evaluation prior to knee surgery due to low heart function. A recent echocardiogram showed an ejection fraction of 35% during pre-operative assessment for knee surgery in February. She has no known coronary disease or prior heart attacks. She denies chest pain, exertional or resting shortness of breath, leg swelling, orthopnea, or PND. She remains very active and has no activity limitation from dyspnea. She had a hip replacement in 2020 and transient leg swelling afterward, treated briefly with a diuretic that she no longer uses. She was told she had a heart murmur and a pinhole in her heart during her 2020 evaluation. Family history includes an aunt on her fathers side who died of a heart attack in her 67 or eighties. There is no other known family history of heart disease. She smoked cigarettes in the past but quit about forty years ago. She remains physically active through her community and church work. She previously took losartan , which caused headaches and was discontinued.       ROS: Please see the history of present illness. All other systems are reviewed and negative.   Past Medical History:  Diagnosis Date   Abnormal EKG 2014    NONSPECIFIC ST DEPRESSION + T ANORMALITY, WORKED UP 2014 DR CATHLYN BIRMINGHAM AND RELEASED BY DR BIRMINGHAM   Acute sinusitis 07/01/2021   11/22, 2/23 Omnicef   Augmentin  bid x 10 d   Acute tonsillitis 08/05/2008   Allergic rhinitis 11/11/2015   3//17 Zyrtec   Use Sinus rinse - given   Allergy    seasonal   Anemia, unspecified 10/27/2007   Ankle swelling 04/23/2019   Asthma    PRN inhalers   Blood in stool 10/27/2007   Recurrent   Chronic   Dr Aneita  Colon is due this fall 2019   Blood transfusion without reported diagnosis    Carpal tunnel syndrome 10/27/2007   R>L  She had L wrist operated on      Cataract    Cerumen impaction 12/01/2009   B wax      Chest pain, atypical 08/26/2012   Chronic knee pain after total replacement of right knee joint 04/21/2022   Closed undisplaced fracture of nasal bone 12/09/2016   Colon polyps    Cystitis 09/08/2009   Dizziness 04/04/2009   Dog bite 09/27/2022   Face 09/2022  S/p Rabies shots   Dyslipidemia 06/20/2007   Chronic   Statin intolerant  On diet  Use fish oil   Eruption cyst 04/05/2014   Fatty infiltration of liver    Finger contusion 11/30/2010   R 4th   Foot pain 06/13/2012   2020 L dorsal pain - OA/capsulitis  2021 L  ankle pain since the Fall of 2020 - saw dr Magdalen, was wearing a boot in may 2021  Inflammatory tendinitis left posterior tib along with flatfoot deformity which is contributory towards the problem  6/21 Treat edema of LEs  Medrol  pac  Sports med ref if not better   Forehead contusion 11/19/2016   C/o hitting a cabinet after chocking on ice - fell; ?passed out at the client's house November 04 2016; had a hematoma on the central forehead. She had a HA x1d. She had a black eye later. No n/v.  Nose bones X ray IMPRESSION:  Depressed anterior nasal bone fractures bilaterally.     Mild swelling of the nasal septal soft tissues.        Electronically Signed    By: Selinda DELENA Blue M.D.    On: 04/12/201   Ganglion cyst 11/11/2015   L wrist  tender 1 cm ganglion 3/17  Dr Camella  ?L foot 2020   Gastric polyp 07/18/2018   GERD (gastroesophageal reflux disease)    Hematochezia 10/27/2007   Hemorrhage of gastrointestinal tract 10/27/2007   Hemorrhoids 10/27/2007   History of COVID-19 09/01/2019   History of revision of total replacement of left hip joint 11/17/2018   HTN (hypertension) 07/11/2007   Chronic - on Norvasc  - d/c (swelling)  D/c Benazepril  due to cough 6/16 - restarted 8/16  D/c Losartan  due to HA  9/20 - Cont on Maxzide   Hydradenitis 01/2009   Right S aureus   Ingrown right big toenail 09/27/2014   Near syncope 10/13/2012   2/14  Abn ECHO   OA (osteoarthritis) of hip 10/18/2018   Osteoarthritis of right knee 04/25/2022   Otitis externa 07/27/2010   2020  Corisporin      Rash and nonspecific skin eruption 04/05/2014   8/15 recurrent - R inner thigh  ?herpes - pt decline Rx   Shoulder pain, right 11/30/2010   R rot cuff strain, repeat 2023  Arnica gel or cream on bruises     S/p PT at [facility]  Blue-Emu cream was recommended  2-3 times a day   Sinus infection 10/02/2018   Statin intolerance 07/29/2020   Tennis elbow 01/13/2021   5/22 L  Ice, Voltaren gel, massage   Tubulovillous adenoma polyp of colon 03/2007   w/ focal high grade dysplasia, no carcinoma   Type II diabetes mellitus 06/12/2007   Diet controlled  CT ca scoring info given 12/19, advised     Not taking Repaginate - re-start  Risks associated with treatment noncompliance were discussed. Compliance was encouraged.  Re-start Rybelsus  - use Assistance program, if covered - too $$$   URI, acute 06/11/2011   10/12, 10/13, 4/15, 11/15, 3/16, 6/16, 2/20, 1/21   UTI (urinary tract infection) 01/04/2014    Past Surgical History:  Procedure Laterality Date   CARPAL TUNNEL RELEASE     Lt  wrist, rt wrist 2014   COLONOSCOPY     POLYPECTOMY     TOTAL HIP ARTHROPLASTY Left 10/18/2018   Procedure: TOTAL HIP ARTHROPLASTY ANTERIOR APPROACH;  Surgeon:  Melodi Lerner, MD;  Location: [facility];  Service: Orthopedics;  Laterality: Left;    TUBAL LIGATION      Current Outpatient Medications  Medication Sig Dispense Refill   aspirin 81 MG chewable tablet Chew 81 mg by mouth daily after breakfast.     cetirizine  (ZYRTEC ) 10 MG tablet Take 1 tablet (10 mg total) by mouth daily as needed. (Patient taking differently: Take  10 mg by mouth in the morning.) 90 tablet 3   cholecalciferol (VITAMIN D3) 10 MCG (400 UNIT) TABS tablet Take 1,000 Units by mouth in the morning.     fluticasone  (FLONASE ) 50 MCG/ACT nasal spray Use 2 spray(s) in each nostril once daily 16 g 5   furosemide  (LASIX ) 20 MG tablet TAKE 1 TO 2 TABLETS EVERY DAY (Patient taking differently: Take 20 mg by mouth daily as needed (fluid retention.). Takes 1-2 tablets daily) 180 tablet 3   hydrocortisone  (ANUSOL -HC) 25 MG suppository Place 1 suppository (25 mg total) rectally 2 (two) times daily. (Patient taking differently: Place 25 mg rectally 2 (two) times daily as needed for hemorrhoids.) 12 suppository 0   Multiple Vitamin (MULTIVITAMIN WITH MINERALS) TABS tablet Take 1 tablet by mouth in the morning.     NON FORMULARY Take 1 capsule by mouth in the morning. [place]     pantoprazole  (PROTONIX ) 40 MG tablet TAKE 1 TABLET EVERY DAY 90 tablet 3   polyethylene glycol (MIRALAX ) 17 g packet Take 17 g by mouth daily as needed.     potassium chloride  (KLOR-CON  10) 10 MEQ tablet Take 1 tablet (10 mEq total) by mouth 2 (two) times daily. 180 tablet 3   repaglinide  (PRANDIN ) 1 MG tablet Take 1 tablet (1 mg total) by mouth 3 (three) times daily before meals. (Patient taking differently: Take 1 mg by mouth in the morning.) 270 tablet 3   triamcinolone  cream (KENALOG ) 0.5 % Apply 1 Application topically 3 (three) times daily. (Patient taking differently: Apply 1 Application topically 3 (three) times daily as needed (poison oak).) 60 g 2   triamterene -hydrochlorothiazide (MAXZIDE-25) 37.5-25 MG  tablet TAKE 1 TABLET EVERY DAY 90 tablet 3   trolamine salicylate (ASPERCREME) 10 % cream Apply 1 application topically 2 (two) times daily as needed for muscle pain.     No current facility-administered medications for this visit.    Allergies:   Amlodipine , Benazepril , Ibuprofen , Losartan , and Lovastatin    Social History:  see above  Family History:  see above  PHYSICAL EXAM: VS:  BP 112/68   Pulse 99   Ht 5' 1 (1.549 m)   Wt 171 lb (77.6 kg)   SpO2 96%   BMI 32.31 kg/m  , BMI Body mass index is 32.31 kg/m. GEN: Well nourished, well developed, in no acute distress HEENT: normal Neck: no JVD, carotid bruits, or masses Cardiac: RRR w/ 3/6 SEM loudest in LUSB; no rubs, or gallops,no edema  Respiratory:  CTAB bilaterally, normal work of breathing GI: soft, nontender, nondistended, + BS Extremities: No LE edema Skin: warm and dry, no rash Neuro:  Strength and sensation are intact  EKG: LBBB (new as of 2025)  Recent Labs: Reviewed  Studies: Reviewed  ASSESSMENT AND PLAN: Monica Horn is a 79 y.o. female who presents for new visit.     Preoperative cardiovascular evaluation for knee surgery Echocardiogram shows reduced ejection fraction of 35%, indicating low heart function. No history of heart disease or symptoms. Family history of heart disease. Will need ischemic evaluation prior to surgery - Ordered CT scan of the heart to assess for coronary blockages. - Ordered blood work to check cholesterol and diabetes markers. - Stop current blood pressure medication to accommodate new heart failure medications.  Heart failure with reduced ejection fraction (EF 35%) Heart failure with reduced ejection fraction of 35%. No symptoms. Discussed potential causes and importance of starting heart failure medications promptly. Explained four pillars of heart failure  treatment. - Started Entresto  24-26 twice daily. - Started metoprolol  XL 25 daily. - Monitor blood pressure  regularly. - Ordered blood work in two weeks to monitor potassium and kidney function. - Plan follow-up appointment in one month to start remaining heart failure medications.  Cardiac murmur TTE unremarkable. Likely flow murmur.      Addendum 09/10/24 12:26 PM - CT with non obstructive CAD. No additional testing needed and patient can proceed to surgery.  Signed, Joelle VEAR Ren Donley, MD  08/23/2024 10:34 AM    [facility] "

## 2024-08-23 ENCOUNTER — Other Ambulatory Visit: Payer: Self-pay | Admitting: *Deleted

## 2024-08-23 ENCOUNTER — Ambulatory Visit

## 2024-08-23 VITALS — BP 112/68 | HR 99 | Ht 61.0 in | Wt 171.0 lb

## 2024-08-23 DIAGNOSIS — Z01818 Encounter for other preprocedural examination: Secondary | ICD-10-CM

## 2024-08-23 DIAGNOSIS — E119 Type 2 diabetes mellitus without complications: Secondary | ICD-10-CM

## 2024-08-23 DIAGNOSIS — E785 Hyperlipidemia, unspecified: Secondary | ICD-10-CM

## 2024-08-23 DIAGNOSIS — I447 Left bundle-branch block, unspecified: Secondary | ICD-10-CM

## 2024-08-23 DIAGNOSIS — I5020 Unspecified systolic (congestive) heart failure: Secondary | ICD-10-CM

## 2024-08-23 MED ORDER — METOPROLOL TARTRATE 25 MG PO TABS: ORAL_TABLET | ORAL | 0 refills | Status: AC

## 2024-08-23 MED ORDER — SACUBITRIL-VALSARTAN 24-26 MG PO TABS: 1.0000 | ORAL_TABLET | Freq: Two times a day (BID) | ORAL | 6 refills | Status: AC

## 2024-08-23 MED ORDER — METOPROLOL SUCCINATE ER 25 MG PO TB24: 25.0000 mg | ORAL_TABLET | Freq: Every day | ORAL | 6 refills | Status: AC

## 2024-08-23 NOTE — Patient Instructions (Addendum)
 Medication Instructions:  Stop furosemide  Stop triamterene  hydrochlorothiazide Start Metoprolol  Succinate 25 mg by mouth daily  Start Entresto  24/26 by mouth twice daily  *If you need a refill on your cardiac medications before your next appointment, please call your pharmacy*  Lab Work: Have lab work done in 2 weeks.  (Lipids, A1C, BMP, Magnesium).  This can be done at the [facility] on the first floor of our building or any [facility] location If you have labs (blood work) drawn today and your tests are completely normal, you will receive your results only by: MyChart Message (if you have MyChart) OR A paper copy in the mail If you have any lab test that is abnormal or we need to change your treatment, we will call you to review the results.  Testing/Procedures: Your physician has requested that you have cardiac CT. Cardiac computed tomography (CT) is a painless test that uses an x-ray machine to take clear, detailed pictures of your heart. For further information please visit [email or web address] Please follow instruction sheet as given.    Follow-Up: At [facility], you and your health needs are our priority.  As part of our continuing mission to provide you with exceptional heart care, our providers are all part of one team.  This team includes your primary Cardiologist (physician) and Advanced Practice Providers or APPs (Physician Assistants and Nurse Practitioners) who all work together to provide you with the care you need, when you need it.  Your next appointment:   1 month(s)  Provider:   Joelle VEAR Ren Donley, MD    We recommend signing up for the patient portal called MyChart.  Sign up information is provided on this After Visit Summary.  MyChart is used to connect with patients for Virtual Visits (Telemedicine).  Patients are able to view lab/test results, encounter notes, upcoming appointments, etc.  Non-urgent messages can be sent to your provider as well.   To  learn more about what you can do with MyChart, go to [email or web address].   Other Instructions     Your cardiac CT will be scheduled at one of the below locations:   [facility] [address] [place], [place] [address] [phone] (Severe contrast allergies only)  OR   [facility] [address] [place], [place] [address] [phone]  OR   [facility] [address] [place], [place] [address] [phone]  OR   [facility] [address]  [place], [place] [address]  OR   [facility] [address] [place], [place] [phone]  If scheduled at [facility], please arrive at the [facility] (Entrance C2) of [facility] 30 minutes prior to test start time. You can use the FREE valet parking offered at entrance C (encouraged to control the heart rate for the test)  Proceed to the [facility] Radiology Department (first floor) to check-in and test prep.  All radiology patients and guests should use entrance C2 at [facility], accessed from [facility], even though the hospital's physical address listed is [address]  If scheduled at the Heart and Vascular Tower at [address], please enter the parking lot using the [address] entrance and use the FREE valet service at the patient drop-off area. Enter the building and check-in with registration on the main floor.  If scheduled at [facility], please arrive to the Heart and Vascular  Center 15 mins early for check-in and test prep.  There is spacious parking and easy access to the radiology department from the [facility] V[facility] entrance. Please enter here and check-in with the desk attendant.   If scheduled at [facility], please arrive 30 minutes early for check-in and test prep.  Please follow these instructions  carefully (unless otherwise directed):  An IV will be required for this test and Nitroglycerin will be given.  Hold all erectile dysfunction medications at least 3 days (72 hrs) prior to test. (Ie viagra, cialis, sildenafil, tadalafil, etc)   On the Night Before the Test: Be sure to Drink plenty of water . Do not consume any caffeinated/decaffeinated beverages or chocolate 12 hours prior to your test. Do not take any antihistamines 12 hours prior to your test.   On the Day of the Test: Drink plenty of water  until 1 hour prior to the test. Do not eat any food 1 hour prior to test. You may take your regular medications prior to the test.  Take metoprolol  (Lopressor ) two hours prior to test. If you take Furosemide /Hydrochlorothiazide/Spironolactone/Chlorthalidone, please HOLD on the morning of the test. Patients who wear a continuous glucose monitor MUST remove the device prior to scanning. FEMALES- please wear underwire-free bra if available, avoid dresses & tight clothing         After the Test: Drink plenty of water . After receiving IV contrast, you may experience a mild flushed feeling. This is normal. On occasion, you may experience a mild rash up to 24 hours after the test. This is not dangerous. If this occurs, you can take Benadryl  25 mg, Zyrtec , Claritin , or Allegra and increase your fluid intake. (Patients taking Tikosyn should avoid Benadryl , and may take Zyrtec , Claritin , or Allegra) If you experience trouble breathing, this can be serious. If it is severe call 911 IMMEDIATELY. If it is mild, please call our office.  We will call to schedule your test 2-4 weeks out understanding that some insurance companies will need an authorization prior to the service being performed.   For more information and frequently asked questions, please visit our website : [email or web address]  For non-scheduling related questions, please contact the cardiac imaging nurse navigator  should you have any questions/concerns: Cardiac Imaging Nurse Navigators Direct Office Dial: [phone]   For scheduling needs, including cancellations and rescheduling, please call Brittany, [phone].

## 2024-09-05 ENCOUNTER — Encounter (HOSPITAL_COMMUNITY): Payer: Self-pay

## 2024-09-06 ENCOUNTER — Ambulatory Visit (HOSPITAL_COMMUNITY)
Admission: RE | Admit: 2024-09-06 | Discharge: 2024-09-06 | Disposition: A | Discharge status: Home / Self Care | Source: Ambulatory Visit | Attending: Cardiology | Admitting: Cardiology

## 2024-09-06 VITALS — BP 108/67 | HR 70

## 2024-09-06 DIAGNOSIS — I447 Left bundle-branch block, unspecified: Secondary | ICD-10-CM | POA: Diagnosis present

## 2024-09-06 DIAGNOSIS — R931 Abnormal findings on diagnostic imaging of heart and coronary circulation: Secondary | ICD-10-CM | POA: Insufficient documentation

## 2024-09-06 DIAGNOSIS — Z01818 Encounter for other preprocedural examination: Secondary | ICD-10-CM | POA: Diagnosis present

## 2024-09-06 DIAGNOSIS — I5020 Unspecified systolic (congestive) heart failure: Secondary | ICD-10-CM | POA: Diagnosis present

## 2024-09-06 DIAGNOSIS — I2510 Atherosclerotic heart disease of native coronary artery without angina pectoris: Secondary | ICD-10-CM

## 2024-09-06 DIAGNOSIS — E119 Type 2 diabetes mellitus without complications: Secondary | ICD-10-CM | POA: Diagnosis present

## 2024-09-06 MED ORDER — NITROGLYCERIN 0.4 MG SL SUBL
0.8000 mg | SUBLINGUAL_TABLET | Freq: Once | SUBLINGUAL | Status: AC
  Administered 2024-09-06: 0.8 mg via SUBLINGUAL

## 2024-09-06 MED ORDER — IOHEXOL 350 MG/ML SOLN
100.0000 mL | Freq: Once | INTRAVENOUS | Status: AC | PRN
  Administered 2024-09-06: 100 mL via INTRAVENOUS

## 2024-09-07 ENCOUNTER — Other Ambulatory Visit (HOSPITAL_BASED_OUTPATIENT_CLINIC_OR_DEPARTMENT_OTHER): Payer: Self-pay | Admitting: Cardiology

## 2024-09-07 ENCOUNTER — Ambulatory Visit (HOSPITAL_COMMUNITY)
Admission: RE | Admit: 2024-09-07 | Discharge: 2024-09-07 | Disposition: A | Source: Ambulatory Visit | Attending: Cardiology

## 2024-09-07 ENCOUNTER — Ambulatory Visit: Payer: Self-pay | Admitting: Cardiology

## 2024-09-07 DIAGNOSIS — R931 Abnormal findings on diagnostic imaging of heart and coronary circulation: Secondary | ICD-10-CM

## 2024-09-07 LAB — POCT I-STAT CREATININE: Creatinine, Ser: 1.2 mg/dL — ABNORMAL HIGH (ref 0.44–1.00)

## 2024-09-17 ENCOUNTER — Ambulatory Visit: Payer: Self-pay

## 2024-09-20 NOTE — Progress Notes (Unsigned)
" °   °  °  Cardiology Office Note Date:  09/20/2024  ID:  Monica Horn, DOB 02/13/1946, MRN [id number] PCP:  Garald Karlynn GAILS, MD  Cardiologist:  Joelle VEAR Ren Donley, MD  No chief complaint on file.     Problems Pre-op for right knee TKA  LBBB TTE 11/25: 35-40%, GH, mod LVH, G1DD CCTA 1/26: CAC 1164 (97), no significant stenosis Medication intolerance- d/c LN due to HA M: ASA81, XL25, SV 24-26  Visits  11/25 EP: new LBBB but asymptomatic --> recommended TTE 1/26: LP/HA1C, coronary CTA, d/c BP med; start SV 24-26 and 25 XL, d/c lasix  02/26: Re-try statin, EN10, SE25, XL to 50     ROS: Otherwise negative Discussed the use of AI scribe software for clinical note transcription with the patient, who gave verbal consent to proceed.  History of Present Illness     Physical Exam VS:  There were no vitals taken for this visit. , BMI There is no height or weight on file to calculate BMI. GEN: Well nourished, well developed, in no acute distress HEENT: normal Neck: no JVD, carotid bruits, or masses Cardiac: ***RRR; no murmurs, rubs, or gallops,no edema  Respiratory:  CTAB bilaterally, normal work of breathing GI: soft, nontender, nondistended, + BS Extremities: No LE edema Skin: warm and dry, no rash Neuro:  Strength and sensation are intact  Recent Labs: Reviewed  Assessment & Plan      Signed, Joelle VEAR Ren Donley, MD  09/20/2024 10:00 AM    [facility] "

## 2024-10-02 ENCOUNTER — Ambulatory Visit

## 2024-10-02 DIAGNOSIS — E785 Hyperlipidemia, unspecified: Secondary | ICD-10-CM

## 2024-10-02 DIAGNOSIS — E119 Type 2 diabetes mellitus without complications: Secondary | ICD-10-CM

## 2024-10-02 DIAGNOSIS — I5020 Unspecified systolic (congestive) heart failure: Secondary | ICD-10-CM

## 2024-10-02 DIAGNOSIS — I447 Left bundle-branch block, unspecified: Secondary | ICD-10-CM

## 2024-10-08 ENCOUNTER — Ambulatory Visit (HOSPITAL_COMMUNITY): Admit: 2024-10-08 | Admitting: Orthopedic Surgery

## 2024-10-08 SURGERY — ARTHROPLASTY, KNEE, TOTAL
Anesthesia: Choice | Site: Knee | Laterality: Right

## 2024-10-17 ENCOUNTER — Ambulatory Visit: Admitting: Internal Medicine
